# Patient Record
Sex: Female | Born: 1950 | Race: Black or African American | Hispanic: No | Marital: Single | Smoking: Former smoker
Health system: Southern US, Community
[De-identification: ages and names within clinical notes are randomized; demographics above are authoritative.]

## PROBLEM LIST (undated history)

## (undated) DIAGNOSIS — N186 End stage renal disease: Secondary | ICD-10-CM

## (undated) DIAGNOSIS — E785 Hyperlipidemia, unspecified: Secondary | ICD-10-CM

## (undated) DIAGNOSIS — I10 Essential (primary) hypertension: Secondary | ICD-10-CM

## (undated) DIAGNOSIS — E119 Type 2 diabetes mellitus without complications: Secondary | ICD-10-CM

## (undated) HISTORY — DX: Hyperlipidemia, unspecified: E78.5

## (undated) HISTORY — PX: CATARACT EXTRACTION: SUR2

## (undated) HISTORY — PX: ABDOMINAL HYSTERECTOMY: SHX81

## (undated) HISTORY — DX: Essential (primary) hypertension: I10

## (undated) HISTORY — PX: APPENDECTOMY: SHX54

## (undated) HISTORY — DX: Type 2 diabetes mellitus without complications: E11.9

---

## 2014-01-17 ENCOUNTER — Encounter (HOSPITAL_COMMUNITY): Payer: Self-pay | Admitting: Pharmacy Technician

## 2014-01-22 ENCOUNTER — Encounter (HOSPITAL_COMMUNITY)
Admission: RE | Disposition: A | Discharge status: Home / Self Care | Payer: Self-pay | Source: Ambulatory Visit | Attending: Ophthalmology

## 2014-01-22 ENCOUNTER — Encounter (HOSPITAL_COMMUNITY): Payer: Self-pay

## 2014-01-22 ENCOUNTER — Ambulatory Visit (HOSPITAL_COMMUNITY)
Admission: RE | Admit: 2014-01-22 | Discharge: 2014-01-22 | Disposition: A | Discharge status: Home / Self Care | Payer: BC Managed Care – PPO | Source: Ambulatory Visit | Attending: Ophthalmology | Admitting: Ophthalmology

## 2014-01-22 DIAGNOSIS — H2640 Unspecified secondary cataract: Secondary | ICD-10-CM | POA: Insufficient documentation

## 2014-01-22 DIAGNOSIS — Z7982 Long term (current) use of aspirin: Secondary | ICD-10-CM | POA: Diagnosis not present

## 2014-01-22 HISTORY — DX: Essential (primary) hypertension: I10

## 2014-01-22 HISTORY — DX: Type 2 diabetes mellitus without complications: E11.9

## 2014-01-22 HISTORY — PX: YAG LASER APPLICATION: SHX6189

## 2014-01-22 SURGERY — TREATMENT, USING YAG LASER
Anesthesia: LOCAL | Laterality: Left

## 2014-01-22 MED ORDER — TROPICAMIDE 1 % OP SOLN
OPHTHALMIC | Status: AC
  Filled 2014-01-22: qty 3

## 2014-01-22 MED ORDER — TROPICAMIDE 1 % OP SOLN
1.0000 [drp] | OPHTHALMIC | Status: AC
  Administered 2014-01-22 (×3): 1 [drp] via OPHTHALMIC

## 2014-01-22 MED ORDER — APRACLONIDINE HCL 1 % OP SOLN
OPHTHALMIC | Status: AC
  Filled 2014-01-22: qty 0.1

## 2014-01-22 MED ORDER — APRACLONIDINE HCL 1 % OP SOLN
1.0000 [drp] | OPHTHALMIC | Status: AC
  Administered 2014-01-22 (×3): 1 [drp] via OPHTHALMIC

## 2014-01-22 MED ORDER — TETRACAINE HCL 0.5 % OP SOLN
1.0000 [drp] | Freq: Once | OPHTHALMIC | Status: AC
  Administered 2014-01-22: 1 [drp] via OPHTHALMIC

## 2014-01-22 MED ORDER — TETRACAINE HCL 0.5 % OP SOLN
OPHTHALMIC | Status: AC
  Filled 2014-01-22: qty 2

## 2014-01-22 NOTE — Brief Op Note (Signed)
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    Indications:  Visual blur and distortion not correctable with optical correction  Procedure:   YAG laser posterior capsulotomy and lysis of anterior capsular bands OD  Eye protection worn by staff:  Yes.   Laser In Use sign on door:  Yes.    Laser:  {LUMENIS YAG/SLT LASER  Power Setting:  1.4-1.9 mJ/burst Anatomical site treated:  Posterior capsule and ant. Capsule traction bands Number of applications:  83 Total energy delivered: 149 mJ Results:  Small central posterior capsulotomy and lysis of capsular bands.  The visual axis was partially obstructed by the loose posterior capsule fragment.  The lens remained in the torqued position.  The patient was discharged home with instructions to continue all her current glaucoma medications in the un-operated eye, and continue all her current glaucoma medications, if any in the operative eye.  Patient was instructed to go to the office, as previously scheduled, for intraocular pressure:  Yes.    Patient verbalizes understanding of discharge instructions:  Yes.

## 2014-01-22 NOTE — H&P (Signed)
I have reviewed the pre printed H&P, the patient was re-examined, and I have identified no significant interval changes in the patient's medical condition.  There is no change in the plan of care since the history and physical of record. 

## 2014-01-22 NOTE — Discharge Instructions (Signed)
Shelley [name]  01/22/2014     Instructions    Activity: No Restrictions.   Diet: Resume Diet you were on at home.   Pain Medication: Tylenol if Needed.   CONTACT YOUR DOCTOR IF YOU HAVE PAIN, REDNESS IN YOUR EYE, OR DECREASED VISION.   Follow-up:in 02/13/2014 @11 :30 am with Dr. Iona Hansen.    Dr. Loran SentersSR:[id number]  Dr. Iona HansenQN:[id number]  Dr. Geoffry ParadiseBB:[id number]   If you find that you cannot contact your physician, but feel that your signs and   Symptoms warrant a physician's attention, call the Emergency Room at   [phone] [phone].   Othern/a

## 2014-01-24 ENCOUNTER — Encounter (HOSPITAL_COMMUNITY): Payer: Self-pay | Admitting: Ophthalmology

## 2014-05-02 ENCOUNTER — Telehealth: Payer: Self-pay | Admitting: Family Medicine

## 2014-05-02 DIAGNOSIS — Z139 Encounter for screening, unspecified: Secondary | ICD-10-CM

## 2014-05-02 NOTE — Telephone Encounter (Signed)
Chart available?

## 2014-05-02 NOTE — Telephone Encounter (Signed)
Pt is requesting labs to be sent over. No previous labs on epic.

## 2014-05-02 NOTE — Telephone Encounter (Signed)
Chart in message pile 

## 2014-05-03 NOTE — Telephone Encounter (Signed)
Blood work orders placed in Epic. Patient notified. 

## 2014-05-03 NOTE — Telephone Encounter (Signed)
Nurses, I never saw the chart. Lipid, glucose, TSH would be fine. If there is medicine she is on or other specific tests requested please let us know

## 2014-05-09 ENCOUNTER — Encounter: Payer: Self-pay | Admitting: Family Medicine

## 2014-05-09 ENCOUNTER — Other Ambulatory Visit: Payer: Self-pay | Admitting: Family Medicine

## 2014-05-09 ENCOUNTER — Ambulatory Visit (INDEPENDENT_AMBULATORY_CARE_PROVIDER_SITE_OTHER): Payer: BLUE CROSS/BLUE SHIELD | Admitting: Family Medicine

## 2014-05-09 VITALS — BP 190/110 | Ht 66.0 in | Wt 145.0 lb

## 2014-05-09 DIAGNOSIS — Z1231 Encounter for screening mammogram for malignant neoplasm of breast: Secondary | ICD-10-CM

## 2014-05-09 DIAGNOSIS — Z0000 Encounter for general adult medical examination without abnormal findings: Secondary | ICD-10-CM

## 2014-05-09 DIAGNOSIS — R110 Nausea: Secondary | ICD-10-CM

## 2014-05-09 DIAGNOSIS — R739 Hyperglycemia, unspecified: Secondary | ICD-10-CM

## 2014-05-09 DIAGNOSIS — I10 Essential (primary) hypertension: Secondary | ICD-10-CM

## 2014-05-09 MED ORDER — TRIAMTERENE-HCTZ 37.5-25 MG PO CAPS: 1.0000 | ORAL_CAPSULE | Freq: Every day | ORAL | Status: DC

## 2014-05-09 NOTE — Patient Instructions (Signed)
DASH Eating Plan °DASH stands for "Dietary Approaches to Stop Hypertension." The DASH eating plan is a healthy eating plan that has been shown to reduce high blood pressure (hypertension). Additional health benefits may include reducing the risk of type 2 diabetes mellitus, heart disease, and stroke. The DASH eating plan may also help with weight loss. °WHAT DO I NEED TO KNOW ABOUT THE DASH EATING PLAN? °For the DASH eating plan, you will follow these general guidelines: °· Choose foods with a percent daily value for sodium of less than 5% (as listed on the food label). °· Use salt-free seasonings or herbs instead of table salt or sea salt. °· Check with your health care provider or pharmacist before using salt substitutes. °· Eat lower-sodium products, often labeled as "lower sodium" or "no salt added." °· Eat fresh foods. °· Eat more vegetables, fruits, and low-fat dairy products. °· Choose whole grains. Look for the word "whole" as the first word in the ingredient list. °· Choose fish and skinless chicken or turkey more often than red meat. Limit fish, poultry, and meat to 6 oz (170 g) each day. °· Limit sweets, desserts, sugars, and sugary drinks. °· Choose heart-healthy fats. °· Limit cheese to 1 oz (28 g) per day. °· Eat more home-cooked food and less restaurant, buffet, and fast food. °· Limit fried foods. °· Cook foods using methods other than frying. °· Limit canned vegetables. If you do use them, rinse them well to decrease the sodium. °· When eating at a restaurant, ask that your food be prepared with less salt, or no salt if possible. °WHAT FOODS CAN I EAT? °Seek help from a dietitian for individual calorie needs. °Grains °Whole grain or whole wheat bread. Brown rice. Whole grain or whole wheat pasta. Quinoa, bulgur, and whole grain cereals. Low-sodium cereals. Corn or whole wheat flour tortillas. Whole grain cornbread. Whole grain crackers. Low-sodium crackers. °Vegetables °Fresh or frozen vegetables  (raw, steamed, roasted, or grilled). Low-sodium or reduced-sodium tomato and vegetable juices. Low-sodium or reduced-sodium tomato sauce and paste. Low-sodium or reduced-sodium canned vegetables.  °Fruits °All fresh, canned (in natural juice), or frozen fruits. °Meat and Other Protein Products °Ground beef (85% or leaner), grass-fed beef, or beef trimmed of fat. Skinless chicken or turkey. Ground chicken or turkey. Pork trimmed of fat. All fish and seafood. Eggs. Dried beans, peas, or lentils. Unsalted nuts and seeds. Unsalted canned beans. °Dairy °Low-fat dairy products, such as skim or 1% milk, 2% or reduced-fat cheeses, low-fat ricotta or cottage cheese, or plain low-fat yogurt. Low-sodium or reduced-sodium cheeses. °Fats and Oils °Tub margarines without trans fats. Light or reduced-fat mayonnaise and salad dressings (reduced sodium). Avocado. Safflower, olive, or canola oils. Natural peanut or almond butter. °Other °Unsalted popcorn and pretzels. °The items listed above may not be a complete list of recommended foods or beverages. Contact your dietitian for more options. °WHAT FOODS ARE NOT RECOMMENDED? °Grains °White bread. White pasta. White rice. Refined cornbread. Bagels and croissants. Crackers that contain trans fat. °Vegetables °Creamed or fried vegetables. Vegetables in a cheese sauce. Regular canned vegetables. Regular canned tomato sauce and paste. Regular tomato and vegetable juices. °Fruits °Dried fruits. Canned fruit in light or heavy syrup. Fruit juice. °Meat and Other Protein Products °Fatty cuts of meat. Ribs, chicken wings, bacon, sausage, bologna, salami, chitterlings, fatback, hot dogs, bratwurst, and packaged luncheon meats. Salted nuts and seeds. Canned beans with salt. °Dairy °Whole or 2% milk, cream, half-and-half, and cream cheese. Whole-fat or sweetened yogurt. Full-fat   cheeses or blue cheese. Nondairy creamers and whipped toppings. Processed cheese, cheese spreads, or cheese  curds. °Condiments °Onion and garlic salt, seasoned salt, table salt, and sea salt. Canned and packaged gravies. Worcestershire sauce. Tartar sauce. Barbecue sauce. Teriyaki sauce. Soy sauce, including reduced sodium. Steak sauce. Fish sauce. Oyster sauce. Cocktail sauce. Horseradish. Ketchup and mustard. Meat flavorings and tenderizers. Bouillon cubes. Hot sauce. Tabasco sauce. Marinades. Taco seasonings. Relishes. °Fats and Oils °Butter, stick margarine, lard, shortening, ghee, and bacon fat. Coconut, palm kernel, or palm oils. Regular salad dressings. °Other °Pickles and olives. Salted popcorn and pretzels. °The items listed above may not be a complete list of foods and beverages to avoid. Contact your dietitian for more information. °WHERE CAN I FIND MORE INFORMATION? °National Heart, Lung, and [employer] Institute: www.nhlbi.nih.gov/health/health-topics/topics/dash/ °Document Released: 02/26/2011 Document Revised: 07/24/2013 Document Reviewed: 01/11/2013 °ExitCare® Patient Information ©2015 [employer]. This information is not intended to replace advice given to you by your health care provider. Make sure you discuss any questions you have with your health care provider. ° °

## 2014-05-09 NOTE — Progress Notes (Signed)
   Subjective:    Patient ID: Shelley [name], female    DOB: 05-31-1950, 64 y.o.   MRN: [id number]  HPI Patient is here today for a well exam. And her BP is 190/110. Patient states that her BP is always this high. Patient admitted that she has had conversations with you about her BP. And that she has never taken any medication. But now she is ready to do something about her BP  The patient comes in today for a wellness visit.    A review of their health history was completed.  A review of medications was also completed.  Any needed refills; no medications currently  Eating habits: healthy eating habits  Falls/  MVA accidents in past few months: no falls or accidents. Cognitive good.  Regular exercise: exercises daily.  Specialist pt sees on regular basis: does not see other physicians at this time.  Preventative health issues were discussed.   Additional concerns: patient is concerned about her blood pressure.  Review of Systems    she denies chest tightness pressure pain shortness breath nausea vomiting diarrhea high fever chills or swelling in the legs she denies rectal bleeding hematuria breast pain or abdominal pain. Objective:   Physical Exam Eardrums normal throat is normal neck no masses lungs clear hearts regular pulse normal breast exam normal abdomen is soft pelvic exam normal no masses felt no sign of cancer rectal exam is normal extremities no edema skin warm dry neurologic grossly normal.       Assessment & Plan:  HTN poor control start Dyazide one every single day. Follow-up in 4 weeks to recheck blood pressure  History hyperlipidemia check lipid profile watch diet closely  History of hyperglycemia check hemoglobin A1c along with metabolic 7  Patient does experience nausea several days had we not bad enough for vomiting no epigastric pain with it no change in diet no abdominal pain with it. It persists we may need to do some testing. Await results of lab  work.  Breast exam normal patient up-to-date on mammogram Pelvic exam normal has had hysterectomy in the past Patient does need colonoscopy. He gave her the sheet to get herself scheduled and to call us if any problems scheduling it Safety measures dietary measures discussed. Patient exercises on a regular basis.

## 2014-05-10 LAB — BASIC METABOLIC PANEL
BUN: 48 mg/dL — ABNORMAL HIGH (ref 6–23)
CO2: 25 mEq/L (ref 19–32)
Calcium: 8.7 mg/dL (ref 8.4–10.5)
Chloride: 107 mEq/L (ref 96–112)
Creat: 2.04 mg/dL — ABNORMAL HIGH (ref 0.50–1.10)
Glucose, Bld: 289 mg/dL — ABNORMAL HIGH (ref 70–99)
Potassium: 4.4 mEq/L (ref 3.5–5.3)
Sodium: 139 mEq/L (ref 135–145)

## 2014-05-11 LAB — HEMOGLOBIN A1C
Hgb A1c MFr Bld: 10.8 % — ABNORMAL HIGH (ref <5.7)
Mean Plasma Glucose: 263 mg/dL — ABNORMAL HIGH (ref <117)

## 2014-05-11 LAB — LIPID PANEL
Cholesterol: 344 mg/dL — ABNORMAL HIGH (ref 0–200)
HDL: 83 mg/dL (ref >39)
LDL Cholesterol: 226 mg/dL — ABNORMAL HIGH (ref 0–99)
Total CHOL/HDL Ratio: 4.1 Ratio
Triglycerides: 174 mg/dL — ABNORMAL HIGH (ref <150)
VLDL: 35 mg/dL (ref 0–40)

## 2014-05-11 LAB — GLUCOSE, RANDOM: Glucose, Bld: 286 mg/dL — ABNORMAL HIGH (ref 70–99)

## 2014-05-11 LAB — TSH: TSH: 2.407 u[IU]/mL (ref 0.350–4.500)

## 2014-05-11 MED ORDER — AMLODIPINE BESYLATE 5 MG PO TABS: 5.0000 mg | ORAL_TABLET | Freq: Every day | ORAL | Status: DC

## 2014-05-11 NOTE — Addendum Note (Signed)
Addended byCharolotte Capuchin D on: 05/11/2014 03:42 PM   Modules accepted: Orders, Medications

## 2014-05-11 NOTE — Telephone Encounter (Signed)
[facility] 2/19 (Norvasc sent)

## 2014-05-14 ENCOUNTER — Ambulatory Visit (HOSPITAL_COMMUNITY)
Admission: RE | Admit: 2014-05-14 | Discharge: 2014-05-14 | Disposition: A | Discharge status: Home / Self Care | Payer: BLUE CROSS/BLUE SHIELD | Source: Ambulatory Visit | Attending: Family Medicine | Admitting: Family Medicine

## 2014-05-14 DIAGNOSIS — Z1231 Encounter for screening mammogram for malignant neoplasm of breast: Secondary | ICD-10-CM | POA: Insufficient documentation

## 2014-05-21 ENCOUNTER — Encounter: Payer: Self-pay | Admitting: Family Medicine

## 2014-05-21 ENCOUNTER — Ambulatory Visit (INDEPENDENT_AMBULATORY_CARE_PROVIDER_SITE_OTHER): Payer: BLUE CROSS/BLUE SHIELD | Admitting: Family Medicine

## 2014-05-21 VITALS — BP 170/100 | Ht 66.0 in | Wt 141.4 lb

## 2014-05-21 DIAGNOSIS — IMO0002 Reserved for concepts with insufficient information to code with codable children: Secondary | ICD-10-CM | POA: Insufficient documentation

## 2014-05-21 DIAGNOSIS — N183 Chronic kidney disease, stage 3 unspecified: Secondary | ICD-10-CM

## 2014-05-21 DIAGNOSIS — I10 Essential (primary) hypertension: Secondary | ICD-10-CM

## 2014-05-21 DIAGNOSIS — E1122 Type 2 diabetes mellitus with diabetic chronic kidney disease: Secondary | ICD-10-CM | POA: Insufficient documentation

## 2014-05-21 DIAGNOSIS — E785 Hyperlipidemia, unspecified: Secondary | ICD-10-CM | POA: Insufficient documentation

## 2014-05-21 DIAGNOSIS — E1165 Type 2 diabetes mellitus with hyperglycemia: Secondary | ICD-10-CM

## 2014-05-21 DIAGNOSIS — N186 End stage renal disease: Secondary | ICD-10-CM

## 2014-05-21 DIAGNOSIS — N189 Chronic kidney disease, unspecified: Secondary | ICD-10-CM | POA: Insufficient documentation

## 2014-05-21 MED ORDER — INSULIN GLARGINE 100 UNIT/ML SOLOSTAR PEN: PEN_INJECTOR | SUBCUTANEOUS | Status: DC

## 2014-05-21 MED ORDER — ATORVASTATIN CALCIUM 20 MG PO TABS: 20.0000 mg | ORAL_TABLET | Freq: Every day | ORAL | Status: DC

## 2014-05-21 NOTE — Telephone Encounter (Signed)
Patient had OV with Dr. Nicki Reaper today in which this was discussed.

## 2014-05-21 NOTE — Telephone Encounter (Signed)
[facility] 2/29

## 2014-05-21 NOTE — Progress Notes (Signed)
   Subjective:    Patient ID: Shelley [name], female    DOB: Aug 09, 1950, 64 y.o.   MRN: [id number]  Diabetes She presents for her follow-up diabetic visit. She has type 2 diabetes mellitus. Her disease course has been worsening. Pertinent negatives for hypoglycemia include no confusion or headaches. Associated symptoms include polyuria. Pertinent negatives for diabetes include no chest pain, no fatigue, no polydipsia, no polyphagia and no weakness. Pertinent negatives for hypoglycemia complications include no blackouts. Risk factors for coronary artery disease include diabetes mellitus, dyslipidemia and family history. When asked about current treatments, none were reported. Her weight is stable. She is following a diabetic diet. She has not had a previous visit with a dietitian. She participates in exercise daily. Her home blood glucose trend is increasing steadily.   Patient is here today to discuss her recent blood work results. Results are in the system. Patient states that she has no concerns at this time.    Review of Systems  Constitutional: Negative for activity change, appetite change and fatigue.  HENT: Negative for congestion.   Respiratory: Negative for cough.   Cardiovascular: Negative for chest pain.  Gastrointestinal: Negative for abdominal pain.  Endocrine: Positive for polyuria. Negative for polydipsia and polyphagia.  Neurological: Negative for weakness and headaches.  Psychiatric/Behavioral: Negative for confusion.       Objective:   Physical Exam  Constitutional: She appears well-nourished. No distress.  Cardiovascular: Normal rate, regular rhythm and normal heart sounds.   No murmur heard. Pulmonary/Chest: Effort normal and breath sounds normal. No respiratory distress.  Musculoskeletal: She exhibits no edema.  Lymphadenopathy:    She has no cervical adenopathy.  Neurological: She is alert. She exhibits normal muscle tone.  Psychiatric: Her behavior is normal.    Vitals reviewed.         Assessment & Plan:  Diabetes-patient needs to start on Lantus. 8 units at night. Titrate accordingly. Follow-up in one week sooner problems. I also encouraged patient to sign out for my chart.  #2 HTN subpar control just started on Norvasc. If blood pressure is not under better control by next week I would bump up the dose again the 10 mg.  Renal insufficiency recheck metabolic 7 within the next few weeks May need ACE inhibitor. May need renal referral.  Diabetic education recommended  May need to be on additional diabetes medicine depending on her response recheck patient in one week

## 2014-05-23 ENCOUNTER — Telehealth: Payer: Self-pay | Admitting: Family Medicine

## 2014-05-23 NOTE — Telephone Encounter (Signed)
Left VM stating RX faxed to [facility].

## 2014-05-23 NOTE — Telephone Encounter (Signed)
Patient needs new diabetic machine and test strips her old one stop working. Call into [facility].

## 2014-05-29 ENCOUNTER — Encounter: Payer: Self-pay | Admitting: Family Medicine

## 2014-05-29 ENCOUNTER — Ambulatory Visit (INDEPENDENT_AMBULATORY_CARE_PROVIDER_SITE_OTHER): Payer: BLUE CROSS/BLUE SHIELD | Admitting: Family Medicine

## 2014-05-29 VITALS — BP 170/100 | Ht 66.0 in | Wt 143.0 lb

## 2014-05-29 DIAGNOSIS — I10 Essential (primary) hypertension: Secondary | ICD-10-CM

## 2014-05-29 MED ORDER — AMLODIPINE BESYLATE 10 MG PO TABS: 10.0000 mg | ORAL_TABLET | Freq: Every day | ORAL | Status: DC

## 2014-05-29 NOTE — Progress Notes (Signed)
   Subjective:    Patient ID: Shelley [name], female    DOB: 21-Jun-1950, 64 y.o.   MRN: [id number]  HPI Patient is here today for a f/u from her last visit on 05/21/14.  Pt's BP is still running high. Denies HA or blurry vision, but does c/o intermittent nausea.  Pt picked up glucose meter yesterday and checked her blood sugar x 1. It was 160 and that was after 3 hours of not eating.   No questions/concerns. Patient denies any chest pressure tightness pain shortness breath nausea or vomiting.   Review of Systems See above    Objective:   Physical Exam  Lungs clear heart regular pulse normal blood pressure elevated 168/98 best reading extremities no edema      Assessment & Plan:  Diabetes-subpar control patient will continue Lantus. She will send to [place] some blood pressure readings within the next few weeks May need adjustment accordingly. May need additional medication depending on the results of the tests  Hypertension subpar. Increase amlodipine. Start 10 mg daily. More than likely will need additional medications as time moves forward  Chronic kidney disease recheck metabolic 7 nonfasting to get an accurate reflection of creatinine also she is made adjustments in her diet with less protein. She is avoiding protein shakes.  Patient is to follow-up in 2 weeks for a nurse visit on blood pressure may need adjustments of medicine or additional medicines. Blood pressure should be shown to me when she comes in for this. To follow-up sooner problems. Office visit 3 months

## 2014-06-02 ENCOUNTER — Encounter: Payer: Self-pay | Admitting: *Deleted

## 2014-06-06 ENCOUNTER — Ambulatory Visit: Payer: BLUE CROSS/BLUE SHIELD | Admitting: Family Medicine

## 2014-06-12 ENCOUNTER — Encounter: Payer: Self-pay | Admitting: Family Medicine

## 2014-06-12 LAB — MICROALBUMIN / CREATININE URINE RATIO
Creatinine, Urine: 86.9 mg/dL
Microalb Creat Ratio: 4973.5 mg/g — ABNORMAL HIGH (ref 0.0–30.0)
Microalb, Ur: 432.2 mg/dL — ABNORMAL HIGH (ref <2.0)

## 2014-06-12 LAB — BASIC METABOLIC PANEL
BUN: 45 mg/dL — ABNORMAL HIGH (ref 6–23)
CO2: 24 mEq/L (ref 19–32)
Calcium: 8.5 mg/dL (ref 8.4–10.5)
Chloride: 105 mEq/L (ref 96–112)
Creat: 2.37 mg/dL — ABNORMAL HIGH (ref 0.50–1.10)
Glucose, Bld: 132 mg/dL — ABNORMAL HIGH (ref 70–99)
Potassium: 4.4 mEq/L (ref 3.5–5.3)
Sodium: 138 mEq/L (ref 135–145)

## 2014-06-13 ENCOUNTER — Ambulatory Visit (INDEPENDENT_AMBULATORY_CARE_PROVIDER_SITE_OTHER): Payer: BLUE CROSS/BLUE SHIELD | Admitting: Family Medicine

## 2014-06-13 ENCOUNTER — Encounter: Payer: Self-pay | Admitting: Family Medicine

## 2014-06-13 VITALS — BP 158/88 | Ht 66.0 in | Wt 144.0 lb

## 2014-06-13 DIAGNOSIS — R809 Proteinuria, unspecified: Secondary | ICD-10-CM

## 2014-06-13 DIAGNOSIS — I10 Essential (primary) hypertension: Secondary | ICD-10-CM

## 2014-06-13 MED ORDER — CLONIDINE HCL 0.1 MG PO TABS: 0.1000 mg | ORAL_TABLET | Freq: Two times a day (BID) | ORAL | Status: DC

## 2014-06-13 NOTE — Telephone Encounter (Signed)
This patient was talked to directly when she came in later in the day for an office visit.

## 2014-06-13 NOTE — Progress Notes (Signed)
   Subjective:    Patient ID: Shelley [name], female    DOB: 06-21-1950, 64 y.o.   MRN: [id number]  Hypertension This is a chronic problem. Treatments tried: amlodipine. There are no compliance problems.   she relates she taking medication she feels is helping some she denies any swelling with it denies any chest pain shortness breath headaches nausea vomiting or diarrhea Pt states no concerns today.    Review of Systems Denies chest tightness pressure pain shortness breath eating healthy stay physically active exercising    Objective:   Physical Exam  Lungs clear hearts regular pulse normal blood pressure is moderately elevated 148/86  I'm holding off on using a ACE inhibitor in this patient because of the elevated creatinine    Assessment & Plan:  HTN Renal insufficiency Add clonidine 0.1 mg twice a day Check a 24-hour urine for protein as well as creatinine clearance Proteinuria on testing [name] well need referral to nephrology. Await the results of those tests Follow-up in a few weeks time to recheck blood pressure

## 2014-06-22 LAB — CREATININE CLEARANCE, URINE, 24 HOUR
Creatinine Clearance: 34 mL/min — ABNORMAL LOW (ref 88–128)
Creatinine, 24H Ur: 1085.4 mg/24 hr (ref 800.0–1800.0)
Creatinine, Ser: 2.22 mg/dL — ABNORMAL HIGH (ref 0.57–1.00)
Creatinine, Urine: 44.3 mg/dL (ref 15.0–278.0)
GFR calc Af Amer: 27 mL/min/{1.73_m2} — ABNORMAL LOW (ref >59)
GFR calc non Af Amer: 23 mL/min/{1.73_m2} — ABNORMAL LOW (ref >59)

## 2014-06-22 LAB — PROTEIN, URINE, 24 HOUR
Protein, 24H Urine: 5808.8 mg/24 hr — ABNORMAL HIGH (ref 30.0–150.0)
Protein, Ur: 219.2 mg/dL — ABNORMAL HIGH (ref 0.0–15.0)

## 2014-06-26 NOTE — Addendum Note (Signed)
Addended by: Dairl Ponder on: 06/26/2014 02:38 PM   Modules accepted: Orders

## 2014-07-04 ENCOUNTER — Encounter: Payer: Self-pay | Admitting: Family Medicine

## 2014-07-04 ENCOUNTER — Ambulatory Visit (INDEPENDENT_AMBULATORY_CARE_PROVIDER_SITE_OTHER): Payer: BLUE CROSS/BLUE SHIELD | Admitting: Family Medicine

## 2014-07-04 VITALS — BP 162/92 | Ht 66.0 in | Wt 150.0 lb

## 2014-07-04 DIAGNOSIS — R809 Proteinuria, unspecified: Secondary | ICD-10-CM | POA: Diagnosis not present

## 2014-07-04 DIAGNOSIS — I10 Essential (primary) hypertension: Secondary | ICD-10-CM | POA: Diagnosis not present

## 2014-07-04 MED ORDER — POTASSIUM CHLORIDE ER 10 MEQ PO TBCR: 10.0000 meq | EXTENDED_RELEASE_TABLET | Freq: Every day | ORAL | Status: DC

## 2014-07-04 MED ORDER — AMLODIPINE BESYLATE 5 MG PO TABS: 5.0000 mg | ORAL_TABLET | Freq: Every day | ORAL | Status: DC

## 2014-07-04 MED ORDER — INDAPAMIDE 1.25 MG PO TABS: 1.2500 mg | ORAL_TABLET | Freq: Every day | ORAL | Status: DC

## 2014-07-04 NOTE — Progress Notes (Signed)
   Subjective:    Patient ID: Shelley [name], female    DOB: 1950-09-01, 64 y.o.   MRN: [id number]  HPI Patient is here today for a recheck up on her HTN.  She said ever since she was put on the Amlodipine, her lefs have been swollen.  Pt also c/o pulling a muscle in her right leg about 3 weeks ago.  I also gave her an update on referral.   We did discuss aspects of watching diet minimizing salt use  Review of Systems  Constitutional: Negative for activity change, appetite change and fatigue.  HENT: Negative for congestion.   Respiratory: Negative for cough.   Cardiovascular: Positive for leg swelling. Negative for chest pain.  Gastrointestinal: Negative for abdominal pain.  Endocrine: Negative for polydipsia and polyphagia.  Neurological: Negative for weakness.  Psychiatric/Behavioral: Negative for confusion.       Objective:   Physical Exam  Constitutional: She appears well-nourished. No distress.  Cardiovascular: Normal rate, regular rhythm and normal heart sounds.   No murmur heard. Pulmonary/Chest: Effort normal and breath sounds normal. No respiratory distress.  Musculoskeletal: She exhibits no edema.  Lymphadenopathy:    She has no cervical adenopathy.  Neurological: She is alert. She exhibits normal muscle tone.  Psychiatric: Her behavior is normal.  Vitals reviewed.    Her 24-hour urine shows significant proteinuria because of her creatinine been elevated though I do not feel comfortable starting ACE inhibitor. We will go ahead and get her in with nephrology.     Assessment & Plan:  Pedal edema related to medication reduce amlodipine from 10 mg use 5 mg Hypertension I would recommend adding indapamide 1.25 mg every morning along with potassium recheck metabolic 7 in approximately 3-4 weeks Pulled groin muscle will take 3-8 weeks to heal stretching exercises shown avoid NSAIDs Awaiting appointment with nephrology  Recheck in 2 months

## 2014-07-14 LAB — BASIC METABOLIC PANEL
BUN/Creatinine Ratio: 13 (ref 11–26)
BUN: 40 mg/dL — ABNORMAL HIGH (ref 8–27)
CO2: 24 mmol/L (ref 18–29)
Calcium: 9.4 mg/dL (ref 8.7–10.3)
Chloride: 103 mmol/L (ref 97–108)
Creatinine, Ser: 3.03 mg/dL — ABNORMAL HIGH (ref 0.57–1.00)
GFR calc Af Amer: 18 mL/min/{1.73_m2} — ABNORMAL LOW (ref >59)
GFR calc non Af Amer: 16 mL/min/{1.73_m2} — ABNORMAL LOW (ref >59)
Glucose: 128 mg/dL — ABNORMAL HIGH (ref 65–99)
Potassium: 4.5 mmol/L (ref 3.5–5.2)
Sodium: 141 mmol/L (ref 134–144)

## 2014-07-17 ENCOUNTER — Telehealth: Payer: Self-pay | Admitting: Family Medicine

## 2014-07-17 NOTE — Telephone Encounter (Signed)
FYI - Pt has been scheduled with Dr. Lowanda Foster for Thursday, May 19 @ 10:30am and they have already notified pt

## 2014-07-19 ENCOUNTER — Encounter: Payer: Self-pay | Admitting: Family Medicine

## 2014-08-06 ENCOUNTER — Other Ambulatory Visit: Payer: Self-pay | Admitting: *Deleted

## 2014-08-06 MED ORDER — ATORVASTATIN CALCIUM 20 MG PO TABS: 20.0000 mg | ORAL_TABLET | Freq: Every day | ORAL | Status: DC

## 2014-08-07 ENCOUNTER — Telehealth: Payer: Self-pay | Admitting: Family Medicine

## 2014-08-07 NOTE — Telephone Encounter (Signed)
I will discuss this with her when she follows up in early June

## 2014-08-07 NOTE — Telephone Encounter (Signed)
nuts

## 2014-08-07 NOTE — Telephone Encounter (Signed)
Fax received from Dr. Florentina Addison office, patient cancelled her appointment for 08/09/14 with their office due to having issues with her insurance

## 2014-08-27 ENCOUNTER — Ambulatory Visit (INDEPENDENT_AMBULATORY_CARE_PROVIDER_SITE_OTHER): Payer: BLUE CROSS/BLUE SHIELD | Admitting: Family Medicine

## 2014-08-27 ENCOUNTER — Encounter: Payer: Self-pay | Admitting: Family Medicine

## 2014-08-27 VITALS — BP 130/84 | Ht 66.0 in | Wt 144.4 lb

## 2014-08-27 DIAGNOSIS — E1165 Type 2 diabetes mellitus with hyperglycemia: Secondary | ICD-10-CM | POA: Diagnosis not present

## 2014-08-27 DIAGNOSIS — N183 Chronic kidney disease, stage 3 unspecified: Secondary | ICD-10-CM

## 2014-08-27 DIAGNOSIS — E119 Type 2 diabetes mellitus without complications: Secondary | ICD-10-CM | POA: Diagnosis not present

## 2014-08-27 DIAGNOSIS — I10 Essential (primary) hypertension: Secondary | ICD-10-CM | POA: Diagnosis not present

## 2014-08-27 DIAGNOSIS — IMO0002 Reserved for concepts with insufficient information to code with codable children: Secondary | ICD-10-CM

## 2014-08-27 LAB — POCT GLYCOSYLATED HEMOGLOBIN (HGB A1C): Hemoglobin A1C: 6.9

## 2014-08-27 NOTE — Progress Notes (Signed)
   Subjective:    Patient ID: Shelley [name], female    DOB: November 25, 1950, 64 y.o.   MRN: [id number]  Diabetes She presents for her follow-up diabetic visit. She has type 2 diabetes mellitus. There are no hypoglycemic associated symptoms. There are no diabetic associated symptoms. There are no hypoglycemic complications. There are no diabetic complications. There are no known risk factors for coronary artery disease. Current diabetic treatment includes insulin injections. She is compliant with treatment all of the time.   Patient states that she has no concerns at this time.  Has history of hyperlipidemia taking medicine as directed Has history of hypertension watching diet taking medicine denies chest tightness pressure pain headaches Patient relates she was unable to see nephrologist because of a mixup with her insurance  Review of Systems Patient denies chest tightness pressure pain shortness breath nausea vomiting diarrhea    Objective:   Physical Exam  Lungs clear hearts were pulse normal extremities no edema foot exam is normal flanks nontender      Assessment & Plan:  Severe renal kidney disease probably related to blood pressure and diabetes is supposed to see nephrologist in the coming weeks repeat metabolic 7  HTN decent control today. Continue medicine watch diet  Hyperlipidemia continue Crestor check lipid liver profile await results  Diabetes good control continue current measures

## 2014-08-27 NOTE — Patient Instructions (Signed)
Dear Patient,  It has been recommended to you that you have a colonoscopy. It is your responsibility to carry through with this recommendation.   Did you realize that colon cancer is the second leading cancer killer in the [place]. One in every 20 adults will get colon cancer. If all adults would go through the recommended screening for colon cancer (getting a colonoscopy), then there would be a 60% reduction in the number of people dying from colon cancer.  Colon cancer just doesn't come out of the blue. It starts off as a small polyp which over time grows into a cancer. A colonoscopy can prevent cancer and in many cases detected when it is at a very treatable phase. Small colon cancers can have cure rates of 95%. Advanced colon cancer, which often occurs in people who do not do their screenings, have cure rates less than 20%. The risk of colon cancer advances with age. Most adults should have regular colonoscopies every 10 years starting at age 107. This recommendation can vary depending on a person's medical history.  Health-care laws now allow for you to call the gastroenterologist office directly in order to set yourself up for this very important tests. Today we have recommended to you that you do this test. This test may save your life. Failure to do this test puts you at risk for premature death from colon cancer. Do the right thing and schedule this test now.  Here as a list of specialists we recommend in the surrounding area. When you call their office let them know that you are a patient of our practice in your interested in doing a screening colonoscopy. They should assist you without problems. You will need the following information when you called them: 1-name of which Dr. you see, 2-your insurance information, 3-a list of medications that you currently take, 4-any allergies you have to medications.  [place] gastroenterologist Dr. Milton Ferguson, Dr Felicie Morn  gastroenterologist   [facility]   [phone]  [facility] gastroenterology (Dr. Garnetta Buddy and [place]) [phone]  [facility] gastroenterology (Dr. Leia Alf, Harrietta Guardian, [place]) [phone]  Each group of specialists has assured [place] that when you called them they will help you get your colonoscopy set up. Should you have problems please let us know. Be sure to call soon. Sincerely, Pearson Forster, Dr Mickie Hillier, [place]

## 2014-08-29 ENCOUNTER — Other Ambulatory Visit: Payer: Self-pay | Admitting: *Deleted

## 2014-08-29 DIAGNOSIS — E785 Hyperlipidemia, unspecified: Secondary | ICD-10-CM

## 2014-08-29 DIAGNOSIS — Z79899 Other long term (current) drug therapy: Secondary | ICD-10-CM

## 2014-08-30 LAB — BASIC METABOLIC PANEL
BUN/Creatinine Ratio: 17 (ref 11–26)
BUN: 49 mg/dL — ABNORMAL HIGH (ref 8–27)
CO2: 24 mmol/L (ref 18–29)
Calcium: 9.5 mg/dL (ref 8.7–10.3)
Chloride: 102 mmol/L (ref 97–108)
Creatinine, Ser: 2.83 mg/dL — ABNORMAL HIGH (ref 0.57–1.00)
GFR calc Af Amer: 20 mL/min/{1.73_m2} — ABNORMAL LOW (ref >59)
GFR calc non Af Amer: 17 mL/min/{1.73_m2} — ABNORMAL LOW (ref >59)
Glucose: 114 mg/dL — ABNORMAL HIGH (ref 65–99)
Potassium: 4.9 mmol/L (ref 3.5–5.2)
Sodium: 141 mmol/L (ref 134–144)

## 2014-08-30 LAB — LIPID PANEL
Chol/HDL Ratio: 2.7 ratio units (ref 0.0–4.4)
Cholesterol, Total: 204 mg/dL — ABNORMAL HIGH (ref 100–199)
HDL: 75 mg/dL (ref >39)
LDL Calculated: 106 mg/dL — ABNORMAL HIGH (ref 0–99)
Triglycerides: 113 mg/dL (ref 0–149)
VLDL Cholesterol Cal: 23 mg/dL (ref 5–40)

## 2014-08-30 LAB — HEPATIC FUNCTION PANEL
ALT: 29 IU/L (ref 0–32)
AST: 33 IU/L (ref 0–40)
Albumin: 4.2 g/dL (ref 3.6–4.8)
Alkaline Phosphatase: 83 IU/L (ref 39–117)
Bilirubin Total: 0.2 mg/dL (ref 0.0–1.2)
Bilirubin, Direct: 0.09 mg/dL (ref 0.00–0.40)
Total Protein: 6.5 g/dL (ref 6.0–8.5)

## 2014-09-10 MED ORDER — ATORVASTATIN CALCIUM 40 MG PO TABS: 40.0000 mg | ORAL_TABLET | Freq: Every day | ORAL | Status: DC

## 2014-09-27 ENCOUNTER — Other Ambulatory Visit: Payer: Self-pay | Admitting: *Deleted

## 2014-09-27 MED ORDER — AMLODIPINE BESYLATE 5 MG PO TABS: 5.0000 mg | ORAL_TABLET | Freq: Every day | ORAL | Status: DC

## 2014-09-27 MED ORDER — INDAPAMIDE 1.25 MG PO TABS: 1.2500 mg | ORAL_TABLET | Freq: Every day | ORAL | Status: DC

## 2014-10-03 ENCOUNTER — Other Ambulatory Visit: Payer: Self-pay | Admitting: *Deleted

## 2014-10-03 MED ORDER — CLONIDINE HCL 0.1 MG PO TABS: 0.1000 mg | ORAL_TABLET | Freq: Two times a day (BID) | ORAL | Status: DC

## 2014-11-01 ENCOUNTER — Other Ambulatory Visit: Payer: Self-pay

## 2014-11-01 MED ORDER — POTASSIUM CHLORIDE ER 10 MEQ PO TBCR: 10.0000 meq | EXTENDED_RELEASE_TABLET | Freq: Every day | ORAL | Status: DC

## 2014-11-27 ENCOUNTER — Other Ambulatory Visit: Payer: Self-pay | Admitting: Family Medicine

## 2014-11-27 ENCOUNTER — Ambulatory Visit: Payer: BLUE CROSS/BLUE SHIELD | Admitting: Family Medicine

## 2014-12-12 ENCOUNTER — Other Ambulatory Visit: Payer: Self-pay | Admitting: Family Medicine

## 2014-12-14 ENCOUNTER — Other Ambulatory Visit: Payer: Self-pay

## 2014-12-14 MED ORDER — INSULIN GLARGINE 100 UNIT/ML SOLOSTAR PEN: PEN_INJECTOR | SUBCUTANEOUS | Status: DC

## 2015-03-02 ENCOUNTER — Other Ambulatory Visit: Payer: Self-pay | Admitting: Family Medicine

## 2015-03-28 ENCOUNTER — Other Ambulatory Visit: Payer: Self-pay

## 2015-03-28 MED ORDER — INSULIN DETEMIR 100 UNIT/ML FLEXPEN: 8.0000 [IU] | PEN_INJECTOR | Freq: Every day | SUBCUTANEOUS | Status: DC

## 2015-04-19 ENCOUNTER — Other Ambulatory Visit: Payer: Self-pay | Admitting: Family Medicine

## 2015-05-15 ENCOUNTER — Other Ambulatory Visit: Payer: Self-pay | Admitting: Family Medicine

## 2015-05-15 ENCOUNTER — Encounter: Payer: Self-pay | Admitting: Family Medicine

## 2015-05-15 ENCOUNTER — Ambulatory Visit (INDEPENDENT_AMBULATORY_CARE_PROVIDER_SITE_OTHER): Payer: BLUE CROSS/BLUE SHIELD | Admitting: Family Medicine

## 2015-05-15 VITALS — BP 130/80 | Temp 98.4°F | Ht 66.0 in | Wt 159.2 lb

## 2015-05-15 DIAGNOSIS — R5383 Other fatigue: Secondary | ICD-10-CM | POA: Diagnosis not present

## 2015-05-15 DIAGNOSIS — R1013 Epigastric pain: Secondary | ICD-10-CM | POA: Diagnosis not present

## 2015-05-15 DIAGNOSIS — R110 Nausea: Secondary | ICD-10-CM

## 2015-05-15 LAB — POCT HEMOGLOBIN: Hemoglobin: 12.1 g/dL — AB (ref 12.2–16.2)

## 2015-05-15 MED ORDER — PANTOPRAZOLE SODIUM 40 MG PO TBEC: 40.0000 mg | DELAYED_RELEASE_TABLET | Freq: Every day | ORAL | Status: DC

## 2015-05-15 NOTE — Progress Notes (Signed)
   Subjective:    Patient ID: Shelley [name], female    DOB: March 21, 1951, 65 y.o.   MRN: [id number]  Abdominal Pain This is a new problem. The current episode started 1 to 4 weeks ago. The problem occurs intermittently. The problem has been unchanged. The pain is located in the generalized abdominal region. The pain is moderate. The quality of the pain is aching. The abdominal pain does not radiate. Associated symptoms include headaches. The pain is aggravated by eating. The pain is relieved by nothing. Treatments tried: tagamet  The treatment provided mild relief.   Patient states she has no other concerns at this time.   PMH benign Review of Systems  Gastrointestinal: Positive for abdominal pain.  Neurological: Positive for headaches.   she relates epigastric pain she relates nausea does not radiate to the back it wakes her up 2-3 times per week in addition to this has fairly persistent nausea with intermittent dry heaves during the day she denies joint pain denies wheezing difficulty breathing chest pain     Objective:   Physical Exam   on exam neck no masses eardrums normal throat is normal lungs are clear no crackles heart is regular no murmurs abdomen is soft there is no guarding rebound or tenderness      Assessment & Plan:   abdominal discomfort intermittent along with frequent nausea and frequent dry heaves-it is hard to discern exactly what is causing this currently we will go ahead and cover with PPI stop aspirin. In addition to this do lab work. Patient may well need to have endoscopy. I doubt pancreatic cancer but she may end up needing to have a CAT scan as well.   this could be gastroparesis as well given her diabetes.   this patient has been counseled before regarding colonoscopy but has deferred in doing so. She has not lost any weight recently and denies any rectal bleeding.

## 2015-05-15 NOTE — Patient Instructions (Signed)
Hold off on the aspirin till we figure everything out

## 2015-05-16 LAB — CBC WITH DIFFERENTIAL/PLATELET
Basophils Absolute: 0 10*3/uL (ref 0.0–0.2)
Basos: 0 %
EOS (ABSOLUTE): 0.1 10*3/uL (ref 0.0–0.4)
Eos: 2 %
Hematocrit: 38.3 % (ref 34.0–46.6)
Hemoglobin: 12 g/dL (ref 11.1–15.9)
Immature Grans (Abs): 0 10*3/uL (ref 0.0–0.1)
Immature Granulocytes: 0 %
Lymphocytes Absolute: 1.2 10*3/uL (ref 0.7–3.1)
Lymphs: 18 %
MCH: 30.1 pg (ref 26.6–33.0)
MCHC: 31.3 g/dL — ABNORMAL LOW (ref 31.5–35.7)
MCV: 96 fL (ref 79–97)
Monocytes Absolute: 0.2 10*3/uL (ref 0.1–0.9)
Monocytes: 3 %
Neutrophils Absolute: 4.9 10*3/uL (ref 1.4–7.0)
Neutrophils: 77 %
Platelets: 293 10*3/uL (ref 150–379)
RBC: 3.99 x10E6/uL (ref 3.77–5.28)
RDW: 13.4 % (ref 12.3–15.4)
WBC: 6.5 10*3/uL (ref 3.4–10.8)

## 2015-05-16 LAB — BASIC METABOLIC PANEL
BUN/Creatinine Ratio: 12 (ref 11–26)
BUN: 46 mg/dL — ABNORMAL HIGH (ref 8–27)
CO2: 23 mmol/L (ref 18–29)
Calcium: 8.6 mg/dL — ABNORMAL LOW (ref 8.7–10.3)
Chloride: 93 mmol/L — ABNORMAL LOW (ref 96–106)
Creatinine, Ser: 3.77 mg/dL — ABNORMAL HIGH (ref 0.57–1.00)
GFR calc Af Amer: 14 mL/min/{1.73_m2} — ABNORMAL LOW (ref >59)
GFR calc non Af Amer: 12 mL/min/{1.73_m2} — ABNORMAL LOW (ref >59)
Glucose: 658 mg/dL (ref 65–99)
Potassium: 5.3 mmol/L — ABNORMAL HIGH (ref 3.5–5.2)
Sodium: 132 mmol/L — ABNORMAL LOW (ref 134–144)

## 2015-05-16 LAB — AMYLASE: Amylase: 154 U/L — ABNORMAL HIGH (ref 31–124)

## 2015-05-16 LAB — HEMOGLOBIN A1C
Est. average glucose Bld gHb Est-mCnc: 301 mg/dL
Hgb A1c MFr Bld: 12.1 % — ABNORMAL HIGH (ref 4.8–5.6)

## 2015-05-16 LAB — LIPASE: Lipase: 460 U/L — ABNORMAL HIGH (ref 0–59)

## 2015-05-17 ENCOUNTER — Other Ambulatory Visit (HOSPITAL_COMMUNITY)
Admission: RE | Admit: 2015-05-17 | Discharge: 2015-05-17 | Disposition: A | Discharge status: Home / Self Care | Payer: BLUE CROSS/BLUE SHIELD | Source: Ambulatory Visit | Attending: Family Medicine | Admitting: Family Medicine

## 2015-05-17 ENCOUNTER — Telehealth: Payer: Self-pay | Admitting: *Deleted

## 2015-05-17 ENCOUNTER — Encounter: Payer: Self-pay | Admitting: Family Medicine

## 2015-05-17 ENCOUNTER — Other Ambulatory Visit: Payer: Self-pay | Admitting: *Deleted

## 2015-05-17 ENCOUNTER — Ambulatory Visit (INDEPENDENT_AMBULATORY_CARE_PROVIDER_SITE_OTHER): Payer: BLUE CROSS/BLUE SHIELD | Admitting: Family Medicine

## 2015-05-17 VITALS — BP 172/106 | Ht 66.0 in | Wt 159.0 lb

## 2015-05-17 DIAGNOSIS — I10 Essential (primary) hypertension: Secondary | ICD-10-CM

## 2015-05-17 DIAGNOSIS — N184 Chronic kidney disease, stage 4 (severe): Secondary | ICD-10-CM | POA: Diagnosis not present

## 2015-05-17 DIAGNOSIS — K858 Other acute pancreatitis without necrosis or infection: Secondary | ICD-10-CM | POA: Diagnosis present

## 2015-05-17 DIAGNOSIS — Z23 Encounter for immunization: Secondary | ICD-10-CM

## 2015-05-17 DIAGNOSIS — N289 Disorder of kidney and ureter, unspecified: Secondary | ICD-10-CM

## 2015-05-17 DIAGNOSIS — E119 Type 2 diabetes mellitus without complications: Secondary | ICD-10-CM

## 2015-05-17 DIAGNOSIS — K859 Acute pancreatitis without necrosis or infection, unspecified: Secondary | ICD-10-CM

## 2015-05-17 LAB — CBC WITH DIFFERENTIAL/PLATELET
Basophils Absolute: 0 10*3/uL (ref 0.0–0.1)
Basophils Relative: 1 %
Eosinophils Absolute: 0.2 10*3/uL (ref 0.0–0.7)
Eosinophils Relative: 3 %
HCT: 33 % — ABNORMAL LOW (ref 36.0–46.0)
Hemoglobin: 11.5 g/dL — ABNORMAL LOW (ref 12.0–15.0)
Lymphocytes Relative: 20 %
Lymphs Abs: 1.2 10*3/uL (ref 0.7–4.0)
MCH: 30.7 pg (ref 26.0–34.0)
MCHC: 34.8 g/dL (ref 30.0–36.0)
MCV: 88.2 fL (ref 78.0–100.0)
Monocytes Absolute: 0.2 10*3/uL (ref 0.1–1.0)
Monocytes Relative: 4 %
Neutro Abs: 4.5 10*3/uL (ref 1.7–7.7)
Neutrophils Relative %: 72 %
Platelets: 232 10*3/uL (ref 150–400)
RBC: 3.74 MIL/uL — ABNORMAL LOW (ref 3.87–5.11)
RDW: 12.9 % (ref 11.5–15.5)
WBC: 6.2 10*3/uL (ref 4.0–10.5)

## 2015-05-17 LAB — BASIC METABOLIC PANEL
Anion gap: 8 (ref 5–15)
BUN: 47 mg/dL — ABNORMAL HIGH (ref 6–20)
CO2: 26 mmol/L (ref 22–32)
Calcium: 8.3 mg/dL — ABNORMAL LOW (ref 8.9–10.3)
Chloride: 103 mmol/L (ref 101–111)
Creatinine, Ser: 4.14 mg/dL — ABNORMAL HIGH (ref 0.44–1.00)
GFR calc Af Amer: 12 mL/min — ABNORMAL LOW (ref >60)
GFR calc non Af Amer: 11 mL/min — ABNORMAL LOW (ref >60)
Glucose, Bld: 343 mg/dL — ABNORMAL HIGH (ref 65–99)
Potassium: 4.4 mmol/L (ref 3.5–5.1)
Sodium: 137 mmol/L (ref 135–145)

## 2015-05-17 LAB — POCT GLUCOSE (DEVICE FOR HOME USE): POC Glucose: 390 mg/dl — AB (ref 70–99)

## 2015-05-17 LAB — LIPASE, BLOOD: Lipase: 66 U/L — ABNORMAL HIGH (ref 11–51)

## 2015-05-17 LAB — AMYLASE: Amylase: 83 U/L (ref 28–100)

## 2015-05-17 MED ORDER — CLONIDINE HCL 0.1 MG PO TABS: 0.1000 mg | ORAL_TABLET | Freq: Two times a day (BID) | ORAL | Status: DC

## 2015-05-17 MED ORDER — INDAPAMIDE 1.25 MG PO TABS: ORAL_TABLET | ORAL | Status: DC

## 2015-05-17 MED ORDER — AMLODIPINE BESYLATE 5 MG PO TABS
ORAL_TABLET | ORAL | Status: DC
Start: 2015-05-17 — End: 2016-01-30

## 2015-05-17 MED ORDER — POTASSIUM CHLORIDE ER 10 MEQ PO TBCR: 10.0000 meq | EXTENDED_RELEASE_TABLET | Freq: Every day | ORAL | Status: DC

## 2015-05-17 MED ORDER — INSULIN DETEMIR 100 UNIT/ML FLEXPEN: 14.0000 [IU] | PEN_INJECTOR | Freq: Every day | SUBCUTANEOUS | Status: DC

## 2015-05-17 MED ORDER — TRIAMTERENE-HCTZ 37.5-25 MG PO CAPS: ORAL_CAPSULE | ORAL | Status: DC

## 2015-05-17 MED ORDER — ATORVASTATIN CALCIUM 40 MG PO TABS: ORAL_TABLET | ORAL | Status: AC

## 2015-05-17 MED ORDER — INSULIN ASPART 100 UNIT/ML ~~LOC~~ SOLN: SUBCUTANEOUS | Status: DC

## 2015-05-17 MED ORDER — INSULIN LISPRO 100 UNIT/ML ~~LOC~~ SOLN: SUBCUTANEOUS | Status: DC

## 2015-05-17 MED ORDER — PANTOPRAZOLE SODIUM 40 MG PO TBEC: 40.0000 mg | DELAYED_RELEASE_TABLET | Freq: Every day | ORAL | Status: DC

## 2015-05-17 NOTE — Telephone Encounter (Signed)
[facility] . Pt has an appt at [facility] on 2/28 register 7:15 am for a  7:30 am. . Nothing to eat or drink after mignight.

## 2015-05-17 NOTE — Progress Notes (Signed)
   Subjective:    Patient ID: Shelley [name], female    DOB: 1950/12/09, 65 y.o.   MRN: [id number]  Diabetes She presents for her follow-up diabetic visit. She has type 2 diabetes mellitus. Current diabetic treatment includes insulin injections.  Bs today 390. Has had coffee with creamer.   Needs refills on all meds.  She states she ran out of her insulin a while back and she just never really thought about taking her insulin until recently she is had difficulty in the past regarding getting her sugars under control in doing adequate job of taking care of herself she is a exceptionally nice patient who is well-intentioned.  Review of Systems She denies any chest tightness pressure pain shortness breath nausea vomiting diarrhea    Objective:   Physical Exam   lungs clear heart regular abdomen soft no guarding rebound or tenderness extremities no edema skin warm dry      Assessment & Plan:   pancreatitis-patient is not having any current pain. Makes somewhat in question. Repeat lab work stand today may need referral to the hospital ER  Diabetes poor control patient has been without her insulin but recently she was able to get she will take 14 units of long-acting insulin and she will start short acting insulin 5 units with all meals she will watch her diet   if she gets worse over the weekend with severe abdominal pain nausea vomiting or other problems she will immediately go to the ER.   addendum-lab work shows worsening creatinine function patient is encouraged to do a better job with fluid intake. Urgent referral back to nephrology.  addendum-lab work shows improvement with pancreas but we will still set her up for ultrasound of the abdomen  She will let us know next week how her glucose readings are. May well need to get her in with nephrology as well.

## 2015-05-17 NOTE — Patient Instructions (Signed)
Please do your lab work  Follow directions on your insulin I will call you later today If you do not receive a phone call please call me back to.

## 2015-05-20 NOTE — Telephone Encounter (Signed)
[facility]

## 2015-05-20 NOTE — Telephone Encounter (Signed)
Patient was notified.

## 2015-05-21 ENCOUNTER — Ambulatory Visit (HOSPITAL_COMMUNITY)
Admission: RE | Admit: 2015-05-21 | Discharge: 2015-05-21 | Disposition: A | Discharge status: Home / Self Care | Payer: BLUE CROSS/BLUE SHIELD | Source: Ambulatory Visit | Attending: Family Medicine | Admitting: Family Medicine

## 2015-05-21 DIAGNOSIS — K859 Acute pancreatitis without necrosis or infection, unspecified: Secondary | ICD-10-CM | POA: Insufficient documentation

## 2015-05-21 DIAGNOSIS — N289 Disorder of kidney and ureter, unspecified: Secondary | ICD-10-CM | POA: Insufficient documentation

## 2015-05-21 DIAGNOSIS — K8689 Other specified diseases of pancreas: Secondary | ICD-10-CM | POA: Insufficient documentation

## 2015-05-22 ENCOUNTER — Encounter: Payer: Self-pay | Admitting: Family Medicine

## 2015-05-23 ENCOUNTER — Encounter: Payer: Self-pay | Admitting: Family Medicine

## 2015-06-17 ENCOUNTER — Ambulatory Visit (INDEPENDENT_AMBULATORY_CARE_PROVIDER_SITE_OTHER): Payer: BLUE CROSS/BLUE SHIELD | Admitting: "Endocrinology

## 2015-06-17 ENCOUNTER — Encounter: Payer: Self-pay | Admitting: "Endocrinology

## 2015-06-17 VITALS — BP 174/98 | HR 97 | Ht 66.0 in | Wt 173.0 lb

## 2015-06-17 DIAGNOSIS — E785 Hyperlipidemia, unspecified: Secondary | ICD-10-CM | POA: Diagnosis not present

## 2015-06-17 DIAGNOSIS — E1122 Type 2 diabetes mellitus with diabetic chronic kidney disease: Secondary | ICD-10-CM

## 2015-06-17 DIAGNOSIS — I10 Essential (primary) hypertension: Secondary | ICD-10-CM

## 2015-06-17 DIAGNOSIS — N185 Chronic kidney disease, stage 5: Secondary | ICD-10-CM | POA: Diagnosis not present

## 2015-06-17 DIAGNOSIS — Z794 Long term (current) use of insulin: Secondary | ICD-10-CM | POA: Diagnosis not present

## 2015-06-17 MED ORDER — FUROSEMIDE 20 MG PO TABS: 20.0000 mg | ORAL_TABLET | Freq: Every day | ORAL | Status: DC

## 2015-06-17 NOTE — Patient Instructions (Signed)

## 2015-06-17 NOTE — Progress Notes (Signed)
Subjective:    Patient ID: Shelley [name], female    DOB: 03/30/1950. Patient is being seen in consultation for management of diabetes requested by  Sallee Lange, MD  Past Medical History  Diagnosis Date  . Hypertension   . Diabetes mellitus without complication ([place])   . HTN (hypertension)   . Diabetes ([place])   . Hyperlipidemia    Past Surgical History  Procedure Laterality Date  . Cataract extraction Bilateral   . Appendectomy    . Yag laser application Left 0000000    Procedure: YAG LASER APPLICATION;  Surgeon: Williams Che, MD;  Location: [facility];  Service: Ophthalmology;  Laterality: Left;  . Abdominal hysterectomy     Social History   Social History  . Marital Status: Single    Spouse Name: N/A  . Number of Children: N/A  . Years of Education: N/A   Social History Main Topics  . Smoking status: Never Smoker   . Smokeless tobacco: None  . Alcohol Use: No  . Drug Use: No  . Sexual Activity: Not Asked   Other Topics Concern  . None   Social History Narrative   Outpatient Encounter Prescriptions as of 06/17/2015  Medication Sig  . amLODipine (NORVASC) 5 MG tablet TAKE 1 TABLET (5 MG TOTAL) BY MOUTH DAILY.  Marland Kitchen atorvastatin (LIPITOR) 40 MG tablet TAKE 1 TABLET (40 MG TOTAL) BY MOUTH DAILY.  . cloNIDine (CATAPRES) 0.1 MG tablet Take 1 tablet (0.1 mg total) by mouth 2 (two) times daily.  . indapamide (LOZOL) 1.25 MG tablet TAKE 1 TABLET (1.25 MG TOTAL) BY MOUTH DAILY.  Marland Kitchen Insulin Glargine (LANTUS SOLOSTAR [place]) Inject 20 Units into the skin at bedtime.  . Insulin Lispro (HUMALOG KWIKPEN [place]) Inject 5-11 Units into the skin 3 (three) times daily with meals.  . pantoprazole (PROTONIX) 40 MG tablet Take 1 tablet (40 mg total) by mouth daily.  . potassium chloride (K-DUR) 10 MEQ tablet Take 1 tablet (10 mEq total) by mouth daily.  . [DISCONTINUED] Insulin Glargine (LANTUS SOLOSTAR) 100 UNIT/ML Solostar Pen Inject 14 Units into the skin at bedtime.  Marland Kitchen aspirin EC 81 MG  tablet Take 81 mg by mouth daily.  . furosemide (LASIX) 20 MG tablet Take 1 tablet (20 mg total) by mouth daily.  . insulin lispro (HUMALOG) 100 UNIT/ML injection 5 units with meals  . Multiple Vitamin (MULTIVITAMIN WITH MINERALS) TABS tablet Take 1 tablet by mouth daily.  Marland Kitchen triamterene-hydrochlorothiazide (DYAZIDE) 37.5-25 MG capsule TAKE 1 EACH (1 CAPSULE TOTAL) BY MOUTH DAILY.  . [DISCONTINUED] insulin aspart (NOVOLOG) 100 UNIT/ML injection 5 units with meals  . [DISCONTINUED] Insulin Detemir (LEVEMIR) 100 UNIT/ML Pen Inject 14 Units into the skin at bedtime. May titrate up to 30 units   No facility-administered encounter medications on file as of 06/17/2015.   ALLERGIES: No Known Allergies VACCINATION STATUS: Immunization History  Administered Date(s) Administered  . Influenza,inj,Quad PF,36+ Mos 05/17/2015  . Pneumococcal-Unspecified 12/22/2010    Diabetes She presents for her initial diabetic visit. She has type 2 diabetes mellitus. Onset time: She was diagnosed at approximate age of 58 years. Her disease course has been worsening. There are no hypoglycemic associated symptoms. Pertinent negatives for hypoglycemia include no confusion, headaches, pallor or seizures. Associated symptoms include fatigue, foot paresthesias, polydipsia, polyuria and visual change. Pertinent negatives for diabetes include no chest pain and no polyphagia. There are no hypoglycemic complications. Symptoms are worsening. 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 She relates  that she has seen nephrology in the past but has not kept her appointments. ) Risk factors for coronary artery disease include diabetes mellitus, dyslipidemia, sedentary lifestyle, tobacco exposure and hypertension. Her weight is increasing steadily (She has seen increasing swelling on her bilateral legs associated with weight gain of 10-15 pounds in the last 4 weeks.). She is following a generally unhealthy diet. When  asked about meal planning, she reported none. She has not had a previous visit with a dietitian (She declines referral to a dietitian.). She participates in exercise intermittently. Her home blood glucose trend is increasing steadily (She did not bring her meter nor logs to review today. Her most recent A1c was 12.1%. This is from May 10 2015.). An ACE inhibitor/angiotensin II receptor blocker is not being taken. She does not see a podiatrist.Eye exam is not current.  Hyperlipidemia This is a chronic problem. The current episode started more than 1 year ago. Pertinent negatives include no chest pain, myalgias or shortness of breath. Current antihyperlipidemic treatment includes statins. Risk factors for coronary artery disease include dyslipidemia, diabetes mellitus, hypertension and a sedentary lifestyle.  Hypertension This is a chronic problem. The current episode started more than 1 year ago. Pertinent negatives include no chest pain, headaches, palpitations or shortness of breath. Risk factors for coronary artery disease include smoking/tobacco exposure, diabetes mellitus and dyslipidemia. Past treatments include central alpha agonists. Hypertensive end-organ damage includes kidney disease.      Review of Systems  Constitutional: Positive for fatigue. Negative for fever, chills and unexpected weight change.  HENT: Negative for trouble swallowing and voice change.   Eyes: Negative for visual disturbance.  Respiratory: Negative for cough, shortness of breath and wheezing.   Cardiovascular: Negative for chest pain, palpitations and leg swelling.  Gastrointestinal: Negative for nausea, vomiting and diarrhea.  Endocrine: Positive for polydipsia and polyuria. Negative for cold intolerance, heat intolerance and polyphagia.  Musculoskeletal: Negative for myalgias and arthralgias.  Skin: Negative for color change, pallor, rash and wound.  Neurological: Negative for seizures and headaches.   Psychiatric/Behavioral: Negative for suicidal ideas and confusion.    Objective:    BP 174/98 mmHg  Pulse 97  Ht 5\' 6"  (1.676 m)  Wt 173 lb (78.472 kg)  BMI 27.94 kg/m2  SpO2 98%  Wt Readings from Last 3 Encounters:  06/17/15 173 lb (78.472 kg)  05/17/15 159 lb (72.122 kg)  05/15/15 159 lb 4 oz (72.235 kg)    Physical Exam  Constitutional: She is oriented to person, place, and time. She appears well-developed.  HENT:  Head: Normocephalic and atraumatic.  Eyes: EOM are normal.  Neck: Normal range of motion. Neck supple. No tracheal deviation present. No thyromegaly present.  Cardiovascular: Normal rate and regular rhythm.   Pulmonary/Chest: Effort normal and breath sounds normal.  Abdominal: Soft. Bowel sounds are normal. There is no tenderness. There is no guarding.  Musculoskeletal: Normal range of motion. She exhibits no edema.  Neurological: She is alert and oriented to person, place, and time. She has normal reflexes. No cranial nerve deficit. Coordination normal.  Skin: Skin is warm and dry. No rash noted. No erythema. No pallor.  Psychiatric: She has a normal mood and affect. Judgment normal.     CMP ( most recent) CMP     Component Value Date/Time   NA 137 05/17/2015 1004   NA 132* 05/15/2015 1349   K 4.4 05/17/2015 1004   CL 103 05/17/2015 1004   CO2 26 05/17/2015 1004  GLUCOSE 343* 05/17/2015 1004   GLUCOSE 658* 05/15/2015 1349   BUN 47* 05/17/2015 1004   BUN 46* 05/15/2015 1349   CREATININE 4.14* 05/17/2015 1004   CREATININE 2.37* 06/11/2014 1519   CALCIUM 8.3* 05/17/2015 1004   PROT 6.5 08/29/2014 0853   ALBUMIN 4.2 08/29/2014 0853   AST 33 08/29/2014 0853   ALT 29 08/29/2014 0853   ALKPHOS 83 08/29/2014 0853   BILITOT 0.2 08/29/2014 0853   GFRNONAA 11* 05/17/2015 1004   GFRAA 12* 05/17/2015 1004     Diabetic Labs (most recent): Lab Results  Component Value Date   HGBA1C 12.1* 05/15/2015   HGBA1C 6.9 08/27/2014   HGBA1C 10.8* 05/09/2014      Lipid Panel ( most recent) Lipid Panel     Component Value Date/Time   CHOL 204* 08/29/2014 0853   CHOL 344* 05/10/2014 0719   TRIG 113 08/29/2014 0853   HDL 75 08/29/2014 0853   HDL 83 05/10/2014 0719   CHOLHDL 2.7 08/29/2014 0853   CHOLHDL 4.1 05/10/2014 0719   VLDL 35 05/10/2014 0719   LDLCALC 106* 08/29/2014 0853   LDLCALC 226* 05/10/2014 0719      Assessment & Plan:   1. Type 2 diabetes mellitus with stage 5 chronic kidney disease not on chronic dialysis, with long-term current use of insulin ([place])   - Patient has currently uncontrolled symptomatic type 2 DM since  66 years of age,  with most recent A1c of 12.1 %. Recent labs reviewed.   Her diabetes is complicated by stage 5 CKD and patient remains at a high risk for more acute and chronic complications of diabetes which include CAD, CVA, CKD, retinopathy, and neuropathy. These are all discussed in detail with the patient.  - I have counseled the patient on diet management and weight loss, by adopting a carbohydrate restricted/protein rich diet.  - Suggestion is made for patient to avoid simple carbohydrates   from their diet including Cakes , Desserts, Ice Cream,  Soda (  diet and regular) , Sweet Tea , Candies,  Chips, Cookies, Artificial Sweeteners,   and "Sugar-free" Products . This will help patient to have stable blood glucose profile and potentially avoid unintended weight gain.  - I encouraged the patient to switch to  unprocessed or minimally processed complex starch and increased protein intake (animal or plant source), fruits, and vegetables.  - Patient is advised to stick to a routine mealtimes to eat 3 meals  a day and avoid unnecessary snacks ( to snack only to correct hypoglycemia).  - The patient has declines a  CDE consult for individualized DM education.  - I have approached patient with the following individualized plan to manage diabetes and patient agrees:   - I  will proceed to readjust basal  insulin Levemir to 20 units QHS, and prandial insulin Humalog 5 units TIDAC for pre-meal BG readings of 90-150mg /dl, plus patient specific correction dose for unexpected hyperglycemia above 150mg /dl, associated with strict monitoring of glucose  AC and HS. - Patient is warned not to take insulin without proper monitoring per orders. -Adjustment parameters are given for hypo and hyperglycemia in writing. -Patient is encouraged to call clinic for blood glucose levels less than 70 or above 300 mg /dl.  -Patient is not a candidate for metformin,SGLT2 i due to CKD. -I will refer her to nephrology in [place]. This is per her request . - Patient will be considered for incretin therapy as appropriate next visit. - Patient specific target  A1c;  LDL, HDL, Triglycerides, and  Waist Circumference were discussed in detail.  2) BP/HTN: uncontrolled. Continue current medications including clonidine. I will consider low-dose ace inhibitor next visit. 3) Lipids/HPL:  uncontrolled , LDL at 106 continue statins. 4)  Weight/Diet:  She declines CDE Consult, exercise, and detailed carbohydrates information provided.  5) Chronic Care/Health Maintenance:  -Patient is on Statin medications and encouraged to continue to follow up with Ophthalmology, Podiatrist at least yearly or according to recommendations, and advised to   stay away from smoking. I have recommended yearly flu vaccine and pneumonia vaccination at least every 5 years; moderate intensity exercise for up to 150 minutes weekly; and  sleep for at least 7 hours a day.  - 60 minutes of time was spent on the care of this patient , 50% of which was applied for counseling on diabetes complications and their preventions.  - Patient to bring meter and  blood glucose logs during their next visit.   - I advised patient to maintain close follow up with Sallee Lange, MD for primary care needs.  Follow up plan: - Return in about 1 week (around 06/24/2015) for  diabetes, high blood pressure, high cholesterol, follow up with pre-visit labs, meter, and logs.  Glade Lloyd, MD Phone: [phone]  Fax: [phone]   06/17/2015, 4:38 PM

## 2015-06-25 ENCOUNTER — Ambulatory Visit (INDEPENDENT_AMBULATORY_CARE_PROVIDER_SITE_OTHER): Payer: BLUE CROSS/BLUE SHIELD | Admitting: "Endocrinology

## 2015-06-25 ENCOUNTER — Encounter: Payer: Self-pay | Admitting: "Endocrinology

## 2015-06-25 VITALS — BP 169/97 | HR 88 | Ht 66.0 in | Wt 169.0 lb

## 2015-06-25 DIAGNOSIS — IMO0002 Reserved for concepts with insufficient information to code with codable children: Secondary | ICD-10-CM

## 2015-06-25 DIAGNOSIS — I10 Essential (primary) hypertension: Secondary | ICD-10-CM | POA: Diagnosis not present

## 2015-06-25 DIAGNOSIS — N184 Chronic kidney disease, stage 4 (severe): Secondary | ICD-10-CM | POA: Diagnosis not present

## 2015-06-25 DIAGNOSIS — Z794 Long term (current) use of insulin: Secondary | ICD-10-CM | POA: Diagnosis not present

## 2015-06-25 DIAGNOSIS — E1165 Type 2 diabetes mellitus with hyperglycemia: Secondary | ICD-10-CM | POA: Diagnosis not present

## 2015-06-25 DIAGNOSIS — E1122 Type 2 diabetes mellitus with diabetic chronic kidney disease: Secondary | ICD-10-CM

## 2015-06-25 DIAGNOSIS — E785 Hyperlipidemia, unspecified: Secondary | ICD-10-CM

## 2015-06-25 NOTE — Patient Instructions (Signed)

## 2015-06-25 NOTE — Progress Notes (Signed)
Subjective:    Patient ID: Shelley [name], female    DOB: 30-Sep-1950. Patient is  to follow up for  management of diabetes requested by  Sallee Lange, MD  Past Medical History  Diagnosis Date  . Hypertension   . Diabetes mellitus without complication ([place])   . HTN (hypertension)   . Diabetes ([place])   . Hyperlipidemia    Past Surgical History  Procedure Laterality Date  . Cataract extraction Bilateral   . Appendectomy    . Yag laser application Left 0000000    Procedure: YAG LASER APPLICATION;  Surgeon: Williams Che, MD;  Location: [facility];  Service: Ophthalmology;  Laterality: Left;  . Abdominal hysterectomy     Social History   Social History  . Marital Status: Single    Spouse Name: N/A  . Number of Children: N/A  . Years of Education: N/A   Social History Main Topics  . Smoking status: Never Smoker   . Smokeless tobacco: None  . Alcohol Use: No  . Drug Use: No  . Sexual Activity: Not Asked   Other Topics Concern  . None   Social History Narrative   Outpatient Encounter Prescriptions as of 06/25/2015  Medication Sig  . amLODipine (NORVASC) 5 MG tablet TAKE 1 TABLET (5 MG TOTAL) BY MOUTH DAILY.  Marland Kitchen aspirin EC 81 MG tablet Take 81 mg by mouth daily.  Marland Kitchen atorvastatin (LIPITOR) 40 MG tablet TAKE 1 TABLET (40 MG TOTAL) BY MOUTH DAILY.  . cloNIDine (CATAPRES) 0.1 MG tablet Take 1 tablet (0.1 mg total) by mouth 2 (two) times daily.  . furosemide (LASIX) 20 MG tablet Take 1 tablet (20 mg total) by mouth daily.  . indapamide (LOZOL) 1.25 MG tablet TAKE 1 TABLET (1.25 MG TOTAL) BY MOUTH DAILY.  Marland Kitchen Insulin Glargine (LANTUS SOLOSTAR) 100 UNIT/ML Solostar Pen Inject 15 Units into the skin at bedtime.  . Insulin Lispro (HUMALOG KWIKPEN [place]) Inject 5-11 Units into the skin 3 (three) times daily with meals.  . Multiple Vitamin (MULTIVITAMIN WITH MINERALS) TABS tablet Take 1 tablet by mouth daily.  . pantoprazole (PROTONIX) 40 MG tablet Take 1 tablet (40 mg total) by mouth daily.   . potassium chloride (K-DUR) 10 MEQ tablet Take 1 tablet (10 mEq total) by mouth daily.  Marland Kitchen triamterene-hydrochlorothiazide (DYAZIDE) 37.5-25 MG capsule TAKE 1 EACH (1 CAPSULE TOTAL) BY MOUTH DAILY.  . [DISCONTINUED] Insulin Glargine (LANTUS SOLOSTAR [place]) Inject 20 Units into the skin at bedtime.  . [DISCONTINUED] insulin lispro (HUMALOG) 100 UNIT/ML injection 5 units with meals   No facility-administered encounter medications on file as of 06/25/2015.   ALLERGIES: No Known Allergies VACCINATION STATUS: Immunization History  Administered Date(s) Administered  . Influenza,inj,Quad PF,36+ Mos 05/17/2015  . Pneumococcal-Unspecified 12/22/2010    Diabetes She presents for her initial diabetic visit. She has type 2 diabetes mellitus. Onset time: She was diagnosed at approximate age of 5 years. Her disease course has been worsening. There are no hypoglycemic associated symptoms. Pertinent negatives for hypoglycemia include no confusion, headaches, pallor or seizures. Associated symptoms include foot paresthesias and visual change. Pertinent negatives for diabetes include no chest pain, no fatigue, no polydipsia, no polyphagia and no polyuria. There are no hypoglycemic complications. Symptoms are worsening. 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 She relates that she has seen nephrology in the past but has not kept her appointments. ) Risk factors for coronary artery disease include diabetes mellitus, dyslipidemia, sedentary lifestyle, tobacco exposure and hypertension.  Her weight is increasing steadily (She has seen increasing swelling on her bilateral legs associated with weight gain of 10-15 pounds in the last 4 weeks.). She is following a generally unhealthy diet. When asked about meal planning, she reported none. She has not had a previous visit with a dietitian (She declines referral to a dietitian.). She participates in exercise intermittently. Her home blood glucose trend  is decreasing steadily. Her breakfast blood glucose range is generally 70-90 mg/dl. Her lunch blood glucose range is generally 110-130 mg/dl. Her dinner blood glucose range is generally 110-130 mg/dl. Her overall blood glucose range is 110-130 mg/dl. An ACE inhibitor/angiotensin II receptor blocker is not being taken. She does not see a podiatrist.Eye exam is not current.  Hyperlipidemia This is a chronic problem. The current episode started more than 1 year ago. Pertinent negatives include no chest pain, myalgias or shortness of breath. Current antihyperlipidemic treatment includes statins. Risk factors for coronary artery disease include dyslipidemia, diabetes mellitus, hypertension and a sedentary lifestyle.  Hypertension This is a chronic problem. The current episode started more than 1 year ago. Pertinent negatives include no chest pain, headaches, palpitations or shortness of breath. Risk factors for coronary artery disease include smoking/tobacco exposure, diabetes mellitus and dyslipidemia. Past treatments include central alpha agonists. Hypertensive end-organ damage includes kidney disease.      Review of Systems  Constitutional: Negative for fever, chills, fatigue and unexpected weight change.  HENT: Negative for trouble swallowing and voice change.   Eyes: Negative for visual disturbance.  Respiratory: Negative for cough, shortness of breath and wheezing.   Cardiovascular: Negative for chest pain, palpitations and leg swelling.  Gastrointestinal: Negative for nausea, vomiting and diarrhea.  Endocrine: Negative for cold intolerance, heat intolerance, polydipsia, polyphagia and polyuria.  Musculoskeletal: Negative for myalgias and arthralgias.  Skin: Negative for color change, pallor, rash and wound.  Neurological: Negative for seizures and headaches.  Psychiatric/Behavioral: Negative for suicidal ideas and confusion.    Objective:    BP 169/97 mmHg  Pulse 88  Ht 5\' 6"  (1.676 m)   Wt 169 lb (76.658 kg)  BMI 27.29 kg/m2  SpO2 96%  Wt Readings from Last 3 Encounters:  06/25/15 169 lb (76.658 kg)  06/17/15 173 lb (78.472 kg)  05/17/15 159 lb (72.122 kg)    Physical Exam  Constitutional: She is oriented to person, place, and time. She appears well-developed.  HENT:  Head: Normocephalic and atraumatic.  Eyes: EOM are normal.  Neck: Normal range of motion. Neck supple. No tracheal deviation present. No thyromegaly present.  Cardiovascular: Normal rate and regular rhythm.   Pulmonary/Chest: Effort normal and breath sounds normal.  Abdominal: Soft. Bowel sounds are normal. There is no tenderness. There is no guarding.  Musculoskeletal: Normal range of motion. She exhibits no edema.  Neurological: She is alert and oriented to person, place, and time. She has normal reflexes. No cranial nerve deficit. Coordination normal.  Skin: Skin is warm and dry. No rash noted. No erythema. No pallor.  Psychiatric: She has a normal mood and affect. Judgment normal.     CMP ( most recent) CMP     Component Value Date/Time   NA 137 05/17/2015 1004   NA 132* 05/15/2015 1349   K 4.4 05/17/2015 1004   CL 103 05/17/2015 1004   CO2 26 05/17/2015 1004   GLUCOSE 343* 05/17/2015 1004   GLUCOSE 658* 05/15/2015 1349   BUN 47* 05/17/2015 1004   BUN 46* 05/15/2015 1349   CREATININE 4.14* 05/17/2015  1004   CREATININE 2.37* 06/11/2014 1519   CALCIUM 8.3* 05/17/2015 1004   PROT 6.5 08/29/2014 0853   ALBUMIN 4.2 08/29/2014 0853   AST 33 08/29/2014 0853   ALT 29 08/29/2014 0853   ALKPHOS 83 08/29/2014 0853   BILITOT 0.2 08/29/2014 0853   GFRNONAA 11* 05/17/2015 1004   GFRAA 12* 05/17/2015 1004     Diabetic Labs (most recent): Lab Results  Component Value Date   HGBA1C 12.1* 05/15/2015   HGBA1C 6.9 08/27/2014   HGBA1C 10.8* 05/09/2014     Lipid Panel ( most recent) Lipid Panel     Component Value Date/Time   CHOL 204* 08/29/2014 0853   CHOL 344* 05/10/2014 0719   TRIG  113 08/29/2014 0853   HDL 75 08/29/2014 0853   HDL 83 05/10/2014 0719   CHOLHDL 2.7 08/29/2014 0853   CHOLHDL 4.1 05/10/2014 0719   VLDL 35 05/10/2014 0719   LDLCALC 106* 08/29/2014 0853   LDLCALC 226* 05/10/2014 0719      Assessment & Plan:   1. Type 2 diabetes mellitus with stage 5 chronic kidney disease not on chronic dialysis, with long-term current use of insulin ([place])   - Patient has currently uncontrolled symptomatic type 2 DM since  65 years of age,  with most recent A1c of 12.1 %. Recent labs reviewed.   Her diabetes is complicated by stage 5 CKD and patient remains at a high risk for more acute and chronic complications of diabetes which include CAD, CVA, CKD, retinopathy, and neuropathy. These are all discussed in detail with the patient.  - I have counseled the patient on diet management and weight loss, by adopting a carbohydrate restricted/protein rich diet.  - Suggestion is made for patient to avoid simple carbohydrates   from their diet including Cakes , Desserts, Ice Cream,  Soda (  diet and regular) , Sweet Tea , Candies,  Chips, Cookies, Artificial Sweeteners,   and "Sugar-free" Products . This will help patient to have stable blood glucose profile and potentially avoid unintended weight gain.  - I encouraged the patient to switch to  unprocessed or minimally processed complex starch and increased protein intake (animal or plant source), fruits, and vegetables.  - Patient is advised to stick to a routine mealtimes to eat 3 meals  a day and avoid unnecessary snacks ( to snack only to correct hypoglycemia).  - The patient has declines a  CDE consult for individualized DM education.  - I have approached patient with the following individualized plan to manage diabetes and patient agrees:   - Since her last visit, she came with significantly improved blood glucose profile. -She did have tighter blood glucose readings in the morning.  I  will proceed to lower her basal  insulin Levemir to 15 units QHS, and prandial insulin Humalog 5 units TIDAC for pre-meal BG readings of 90-150mg /dl, plus patient specific correction dose for unexpected hyperglycemia above 150mg /dl, associated with strict monitoring of glucose  AC and HS. - Patient is warned not to take insulin without proper monitoring per orders. -Adjustment parameters are given for hypo and hyperglycemia in writing. -Patient is encouraged to call clinic for blood glucose levels less than 70 or above 300 mg /dl.  -Patient is not a candidate for metformin,SGLT2 i due to CKD. - Her visit to nephrology in [place] is pending. This is per her request . - Patient will be considered for incretin therapy as appropriate next visit. - Patient specific target  A1c;  LDL, HDL, Triglycerides, and  Waist Circumference were discussed in detail.  2) BP/HTN: uncontrolled. Continue current medications including clonidine. I will consider low-dose ace inhibitor next visit. 3) Lipids/HPL:  uncontrolled , LDL at 106 continue statins. 4)  Weight/Diet:  She declines CDE Consult, exercise, and detailed carbohydrates information provided.  5) Chronic Care/Health Maintenance:  -Patient is on Statin medications and encouraged to continue to follow up with Ophthalmology, Podiatrist at least yearly or according to recommendations, and advised to   stay away from smoking. I have recommended yearly flu vaccine and pneumonia vaccination at least every 5 years; moderate intensity exercise for up to 150 minutes weekly; and  sleep for at least 7 hours a day.  -  25 minutes of time was spent on the care of this patient , 50% of which was applied for counseling on diabetes complications and their preventions.  - Patient to bring meter and  blood glucose logs during their next visit.   - I advised patient to maintain close follow up with Sallee Lange, MD for primary care needs.  Follow up plan: - Return in about 8 weeks (around 08/20/2015)  for diabetes, high blood pressure, high cholesterol, follow up with pre-visit labs, meter, and logs.  Glade Lloyd, MD Phone: [phone]  Fax: [phone]   06/25/2015, 5:27 PM

## 2015-07-07 ENCOUNTER — Other Ambulatory Visit: Payer: Self-pay | Admitting: Family Medicine

## 2015-07-17 ENCOUNTER — Other Ambulatory Visit: Payer: Self-pay | Admitting: "Endocrinology

## 2015-07-17 ENCOUNTER — Other Ambulatory Visit: Payer: Self-pay | Admitting: Family Medicine

## 2015-07-30 ENCOUNTER — Other Ambulatory Visit: Payer: Self-pay | Admitting: *Deleted

## 2015-07-30 MED ORDER — PANTOPRAZOLE SODIUM 40 MG PO TBEC: 40.0000 mg | DELAYED_RELEASE_TABLET | Freq: Every day | ORAL | Status: DC

## 2015-08-07 ENCOUNTER — Encounter: Payer: Self-pay | Admitting: Family Medicine

## 2015-08-09 ENCOUNTER — Telehealth: Payer: Self-pay | Admitting: *Deleted

## 2015-08-09 NOTE — Telephone Encounter (Signed)
Send to dr nida per dr Nicki Reaper

## 2015-08-09 NOTE — Telephone Encounter (Signed)
Request from [facility] for levemir. Pt referred to dr nida and saw him in April. Do you want to refill or send request to dr nida.

## 2015-08-09 NOTE — Telephone Encounter (Signed)
Fax sent to dr Dorris Fetch

## 2015-08-12 ENCOUNTER — Other Ambulatory Visit: Payer: Self-pay | Admitting: Family Medicine

## 2015-08-14 ENCOUNTER — Other Ambulatory Visit: Payer: Self-pay | Admitting: "Endocrinology

## 2015-08-14 LAB — HEMOGLOBIN A1C
Hgb A1c MFr Bld: 10 % — ABNORMAL HIGH (ref <5.7)
Mean Plasma Glucose: 240 mg/dL

## 2015-08-14 LAB — LIPID PANEL
Cholesterol: 183 mg/dL (ref 125–200)
HDL: 64 mg/dL (ref >=46)
LDL Cholesterol: 99 mg/dL (ref <130)
Total CHOL/HDL Ratio: 2.9 Ratio (ref <=5.0)
Triglycerides: 99 mg/dL (ref <150)
VLDL: 20 mg/dL (ref <30)

## 2015-08-14 LAB — BASIC METABOLIC PANEL
BUN: 72 mg/dL — ABNORMAL HIGH (ref 7–25)
CO2: 20 mmol/L (ref 20–31)
Calcium: 8.3 mg/dL — ABNORMAL LOW (ref 8.6–10.4)
Chloride: 105 mmol/L (ref 98–110)
Creat: 6.21 mg/dL — ABNORMAL HIGH (ref 0.50–0.99)
Glucose, Bld: 178 mg/dL — ABNORMAL HIGH (ref 65–99)
Potassium: 4.4 mmol/L (ref 3.5–5.3)
Sodium: 141 mmol/L (ref 135–146)

## 2015-08-14 LAB — TSH: TSH: 1.74 mIU/L

## 2015-08-14 LAB — T4, FREE: Free T4: 1.1 ng/dL (ref 0.8–1.8)

## 2015-08-15 ENCOUNTER — Other Ambulatory Visit (HOSPITAL_COMMUNITY): Payer: BLUE CROSS/BLUE SHIELD

## 2015-08-15 ENCOUNTER — Ambulatory Visit: Payer: BLUE CROSS/BLUE SHIELD | Admitting: Vascular Surgery

## 2015-08-15 ENCOUNTER — Encounter (HOSPITAL_COMMUNITY): Payer: BLUE CROSS/BLUE SHIELD

## 2015-08-16 ENCOUNTER — Other Ambulatory Visit: Payer: Self-pay | Admitting: *Deleted

## 2015-08-16 DIAGNOSIS — N185 Chronic kidney disease, stage 5: Secondary | ICD-10-CM

## 2015-08-16 DIAGNOSIS — Z01810 Encounter for preprocedural cardiovascular examination: Secondary | ICD-10-CM

## 2015-08-20 ENCOUNTER — Encounter: Payer: Self-pay | Admitting: "Endocrinology

## 2015-08-20 ENCOUNTER — Ambulatory Visit (INDEPENDENT_AMBULATORY_CARE_PROVIDER_SITE_OTHER): Payer: BLUE CROSS/BLUE SHIELD | Admitting: "Endocrinology

## 2015-08-20 VITALS — BP 160/78 | HR 63 | Ht 66.0 in | Wt 180.0 lb

## 2015-08-20 DIAGNOSIS — E785 Hyperlipidemia, unspecified: Secondary | ICD-10-CM | POA: Diagnosis not present

## 2015-08-20 DIAGNOSIS — E1165 Type 2 diabetes mellitus with hyperglycemia: Secondary | ICD-10-CM

## 2015-08-20 DIAGNOSIS — N186 End stage renal disease: Secondary | ICD-10-CM

## 2015-08-20 DIAGNOSIS — IMO0002 Reserved for concepts with insufficient information to code with codable children: Secondary | ICD-10-CM

## 2015-08-20 DIAGNOSIS — E1122 Type 2 diabetes mellitus with diabetic chronic kidney disease: Secondary | ICD-10-CM

## 2015-08-20 DIAGNOSIS — I10 Essential (primary) hypertension: Secondary | ICD-10-CM

## 2015-08-20 DIAGNOSIS — N185 Chronic kidney disease, stage 5: Secondary | ICD-10-CM | POA: Diagnosis not present

## 2015-08-20 DIAGNOSIS — Z9119 Patient's noncompliance with other medical treatment and regimen: Secondary | ICD-10-CM | POA: Diagnosis not present

## 2015-08-20 DIAGNOSIS — Z91199 Patient's noncompliance with other medical treatment and regimen due to unspecified reason: Secondary | ICD-10-CM | POA: Insufficient documentation

## 2015-08-20 NOTE — Progress Notes (Signed)
Subjective:    Patient ID: Shelley [name], female    DOB: [birth year]. Patient is  to follow up for  management of diabetes requested by  Sallee Lange, MD  Past Medical History  Diagnosis Date  . Hypertension   . Diabetes mellitus without complication ([place])   . HTN (hypertension)   . Diabetes ([place])   . Hyperlipidemia    Past Surgical History  Procedure Laterality Date  . Cataract extraction Bilateral   . Appendectomy    . Yag laser application Left 0000000    Procedure: YAG LASER APPLICATION;  Surgeon: Williams Che, MD;  Location: [facility];  Service: Ophthalmology;  Laterality: Left;  . Abdominal hysterectomy     Social History   Social History  . Marital Status: Single    Spouse Name: N/A  . Number of Children: N/A  . Years of Education: N/A   Social History Main Topics  . Smoking status: Never Smoker   . Smokeless tobacco: Not on file  . Alcohol Use: No  . Drug Use: No  . Sexual Activity: Not on file   Other Topics Concern  . Not on file   Social History Narrative   Outpatient Encounter Prescriptions as of 08/20/2015  Medication Sig  . calcitRIOL (ROCALTROL) 0.25 MCG capsule Take 0.25 mcg by mouth daily.  . hydrALAZINE (APRESOLINE) 100 MG tablet Take 100 mg by mouth 3 (three) times daily.  . promethazine (PHENERGAN) 12.5 MG tablet Take 12.5 mg by mouth as needed for nausea or vomiting.  Marland Kitchen amLODipine (NORVASC) 5 MG tablet TAKE 1 TABLET (5 MG TOTAL) BY MOUTH DAILY. (Patient taking differently: 10 mg. TAKE 1 TABLET (5 MG TOTAL) BY MOUTH DAILY.)  . aspirin EC 81 MG tablet Take 81 mg by mouth daily.  Marland Kitchen atorvastatin (LIPITOR) 40 MG tablet TAKE 1 TABLET (40 MG TOTAL) BY MOUTH DAILY.  . cloNIDine (CATAPRES) 0.1 MG tablet TAKE 1 TABLET BY MOUTH TWICE A DAY  . furosemide (LASIX) 20 MG tablet TAKE 1 TABLET (20 MG TOTAL) BY MOUTH DAILY. (Patient taking differently: TAKE 80MG  TOTAL BY MOUTH DAILY.)  . Insulin Glargine (LANTUS SOLOSTAR) 100 UNIT/ML Solostar Pen Inject 15  Units into the skin at bedtime.  . Insulin Lispro (HUMALOG KWIKPEN [place]) Inject 5-11 Units into the skin 3 (three) times daily with meals.  Marland Kitchen LEVEMIR FLEXTOUCH 100 UNIT/ML Pen INJECT 8 UNITS INTO THE SKIN AT BEDTIME. MAY TITRATE UP TO 30 UNITS (SPLIT BILL)  . Multiple Vitamin (MULTIVITAMIN WITH MINERALS) TABS tablet Take 1 tablet by mouth daily.  . pantoprazole (PROTONIX) 40 MG tablet Take 1 tablet (40 mg total) by mouth daily.  Marland Kitchen triamterene-hydrochlorothiazide (DYAZIDE) 37.5-25 MG capsule TAKE 1 EACH (1 CAPSULE TOTAL) BY MOUTH DAILY.  . [DISCONTINUED] cloNIDine (CATAPRES) 0.1 MG tablet Take 1 tablet (0.1 mg total) by mouth 2 (two) times daily.  . [DISCONTINUED] indapamide (LOZOL) 1.25 MG tablet TAKE 1 TABLET (1.25 MG TOTAL) BY MOUTH DAILY.  . [DISCONTINUED] potassium chloride (K-DUR) 10 MEQ tablet Take 1 tablet (10 mEq total) by mouth daily.   No facility-administered encounter medications on file as of 08/20/2015.   ALLERGIES: No Known Allergies VACCINATION STATUS: Immunization History  Administered Date(s) Administered  . Influenza,inj,Quad PF,36+ Mos 05/17/2015  . Pneumococcal-Unspecified 12/22/2010    Diabetes She presents for her initial diabetic visit. She has type 2 diabetes mellitus. Onset time: She was diagnosed at approximate age of 12 years. Her disease course has been improving. There are no hypoglycemic associated symptoms.  Pertinent negatives for hypoglycemia include no confusion, headaches, pallor or seizures. Associated symptoms include foot paresthesias and visual change. Pertinent negatives for diabetes include no chest pain, no fatigue, no polydipsia, no polyphagia and no polyuria. There are no hypoglycemic complications. Symptoms are improving. Diabetic complications include nephropathy. (She has stage 5 CKD, she states that she has seen her nephrologist in [place] and being considered for hemodialysis to start  in 2 weeks. ) Risk factors for coronary artery disease  include diabetes mellitus, dyslipidemia, sedentary lifestyle, tobacco exposure and hypertension. Her weight is increasing steadily (She has seen increasing swelling on her bilateral feet and legs and gained 10 pounds of weight since last visit.). She is following a generally unhealthy diet. When asked about meal planning, she reported none. She has not had a previous visit with a dietitian (She declines referral to a dietitian.). She participates in exercise intermittently. Home blood sugar record trend: She did not stay committed on basal/bolus insulin. She monitored only once a day for 6 weeks. Her A1c is improved but still high at 10%. Her overall blood glucose range is >200 mg/dl. An ACE inhibitor/angiotensin II receptor blocker is not being taken. She does not see a podiatrist.Eye exam is not current.  Hyperlipidemia This is a chronic problem. The current episode started more than 1 year ago. Pertinent negatives include no chest pain, myalgias or shortness of breath. Current antihyperlipidemic treatment includes statins. Risk factors for coronary artery disease include dyslipidemia, diabetes mellitus, hypertension and a sedentary lifestyle.  Hypertension This is a chronic problem. The current episode started more than 1 year ago. Pertinent negatives include no chest pain, headaches, palpitations or shortness of breath. Risk factors for coronary artery disease include smoking/tobacco exposure, diabetes mellitus and dyslipidemia. Past treatments include central alpha agonists. Hypertensive end-organ damage includes kidney disease.      Review of Systems  Constitutional: Negative for fever, chills, fatigue and unexpected weight change.  HENT: Negative for trouble swallowing and voice change.   Eyes: Negative for visual disturbance.  Respiratory: Negative for cough, shortness of breath and wheezing.   Cardiovascular: Negative for chest pain, palpitations and leg swelling.  Gastrointestinal: Negative  for nausea, vomiting and diarrhea.  Endocrine: Negative for cold intolerance, heat intolerance, polydipsia, polyphagia and polyuria.  Musculoskeletal: Negative for myalgias and arthralgias.  Skin: Negative for color change, pallor, rash and wound.  Neurological: Negative for seizures and headaches.  Psychiatric/Behavioral: Negative for suicidal ideas and confusion.    Objective:    BP 160/78 mmHg  Pulse 63  Ht 5\' 6"  (1.676 m)  Wt 180 lb (81.647 kg)  BMI 29.07 kg/m2  Wt Readings from Last 3 Encounters:  08/20/15 180 lb (81.647 kg)  06/25/15 169 lb (76.658 kg)  06/17/15 173 lb (78.472 kg)    Physical Exam  Constitutional: She is oriented to person, place, and time. She appears well-developed.  HENT:  Head: Normocephalic and atraumatic.  Eyes: EOM are normal.  Neck: Normal range of motion. Neck supple. No tracheal deviation present. No thyromegaly present.  Cardiovascular: Normal rate and regular rhythm.   Pulmonary/Chest: Effort normal and breath sounds normal.  Abdominal: Soft. Bowel sounds are normal. There is no tenderness. There is no guarding.  Musculoskeletal: Normal range of motion. She exhibits no edema.  Neurological: She is alert and oriented to person, place, and time. She has normal reflexes. No cranial nerve deficit. Coordination normal.  Skin: Skin is warm and dry. No rash noted. No erythema. No pallor.  Psychiatric: She  has a normal mood and affect. Judgment normal.    CMP     Component Value Date/Time   NA 141 08/14/2015 1037   NA 132* 05/15/2015 1349   K 4.4 08/14/2015 1037   CL 105 08/14/2015 1037   CO2 20 08/14/2015 1037   GLUCOSE 178* 08/14/2015 1037   GLUCOSE 658* 05/15/2015 1349   BUN 72* 08/14/2015 1037   BUN 46* 05/15/2015 1349   CREATININE 6.21* 08/14/2015 1037   CREATININE 4.14* 05/17/2015 1004   CALCIUM 8.3* 08/14/2015 1037   PROT 6.5 08/29/2014 0853   ALBUMIN 4.2 08/29/2014 0853   AST 33 08/29/2014 0853   ALT 29 08/29/2014 0853   ALKPHOS  83 08/29/2014 0853   BILITOT 0.2 08/29/2014 0853   GFRNONAA 11* 05/17/2015 1004   GFRAA 12* 05/17/2015 1004    Diabetic Labs (most recent): Lab Results  Component Value Date   HGBA1C 10.0* 08/14/2015   HGBA1C 12.1* 05/15/2015   HGBA1C 6.9 08/27/2014     Lipid Panel ( most recent) Lipid Panel     Component Value Date/Time   CHOL 183 08/14/2015 1037   CHOL 204* 08/29/2014 0853   TRIG 99 08/14/2015 1037   HDL 64 08/14/2015 1037   HDL 75 08/29/2014 0853   CHOLHDL 2.9 08/14/2015 1037   CHOLHDL 2.7 08/29/2014 0853   VLDL 20 08/14/2015 1037   LDLCALC 99 08/14/2015 1037   LDLCALC 106* 08/29/2014 0853      Assessment & Plan:   1. Type 2 diabetes mellitus with stage 5 chronic kidney disease not on chronic dialysis, with long-term current use of insulin ([place])   - Patient has currently uncontrolled symptomatic type 2 DM since  65 years of age,  with most recent A1c of  10% slowly improving 12.1 %. Recent labs reviewed, showing into stage renal disease being prepared for hemodialysis by her nephrology group in [place].  - Her diabetes is complicated mainly due to prior history of chronic noncompliance.  -She remains at a high risk for more acute and chronic complications of diabetes which include CAD, CVA, retinopathy, and neuropathy. These are all discussed in detail with the patient.  - I have counseled the patient on diet management and weight loss, by adopting a carbohydrate restricted/protein rich diet.  - Suggestion is made for patient to avoid simple carbohydrates   from their diet including Cakes , Desserts, Ice Cream,  Soda (  diet and regular) , Sweet Tea , Candies,  Chips, Cookies, Artificial Sweeteners,   and "Sugar-free" Products . This will help patient to have stable blood glucose profile and potentially avoid unintended weight gain.  - I encouraged the patient to switch to  unprocessed or minimally processed complex starch and increased protein intake (animal or  plant source), fruits, and vegetables.  - Patient is advised to stick to a routine mealtimes to eat 3 meals  a day and avoid unnecessary snacks ( to snack only to correct hypoglycemia).  - The patient has declines a  CDE consult for individualized DM education.  - I have approached patient with the following individualized plan to manage diabetes and patient agrees:   -Once she starts hemodialysis, she may have a chance to treat diabetes with premixed insulin twice a day with breakfast and supper. However given her current high A1c of 10% with EAG of 240 mg per deciliter, and advised her to stay on basal/bolus insulin.  I  will proceed with Levemir  15 units QHS, and  Humalog  5 units TIDAC for pre-meal BG readings of 90-150mg /dl, plus patient specific correction dose for unexpected hyperglycemia above 150mg /dl, associated with strict monitoring of glucose  AC and HS. - Patient is warned not to take insulin without proper monitoring per orders. -Adjustment parameters are given for hypo and hyperglycemia in writing. -Patient is encouraged to call clinic for blood glucose levels less than 70 or above 300 mg /dl.  -Patient is not a candidate for metformin,SGLT2 i due to CKD.  - Patient will be considered for incretin therapy as appropriate after next visit. - Patient specific target  A1c;  LDL, HDL, Triglycerides, and  Waist Circumference were discussed in detail.  2) BP/HTN: uncontrolled. Continue current medications including clonidine. I will consider low-dose ace inhibitor next visit. 3) Lipids/HPL:  uncontrolled , LDL at 106 continue statins. 4)  Weight/Diet:  She declines CDE Consult, exercise, and detailed carbohydrates information provided.  5) Chronic Care/Health Maintenance:  -Patient is on Statin medications and encouraged to continue to follow up with Ophthalmology, Podiatrist at least yearly or according to recommendations, and advised to   stay away from smoking. I have recommended  yearly flu vaccine and pneumonia vaccination at least every 5 years; moderate intensity exercise for up to 150 minutes weekly; and  sleep for at least 7 hours a day.  -  25 minutes of time was spent on the care of this patient , 50% of which was applied for counseling on diabetes complications and their preventions.  - Patient to bring meter and  blood glucose logs during their next visit.   - I advised patient to maintain close follow up with Sallee Lange, MD for primary care needs.  Follow up plan: - Return in about 3 weeks (around 09/10/2015) for follow up with meter and logs- no labs.  Glade Lloyd, MD Phone: [phone]  Fax: [phone]   08/20/2015, 11:04 AM

## 2015-09-05 ENCOUNTER — Encounter (HOSPITAL_COMMUNITY): Payer: BLUE CROSS/BLUE SHIELD

## 2015-09-05 ENCOUNTER — Ambulatory Visit: Payer: BLUE CROSS/BLUE SHIELD | Admitting: Vascular Surgery

## 2015-09-05 ENCOUNTER — Encounter: Payer: Self-pay | Admitting: Vascular Surgery

## 2015-09-05 ENCOUNTER — Other Ambulatory Visit (HOSPITAL_COMMUNITY): Payer: BLUE CROSS/BLUE SHIELD

## 2015-09-13 ENCOUNTER — Ambulatory Visit (HOSPITAL_COMMUNITY): Payer: BLUE CROSS/BLUE SHIELD

## 2015-09-13 ENCOUNTER — Ambulatory Visit: Payer: BLUE CROSS/BLUE SHIELD | Admitting: Vascular Surgery

## 2015-09-13 ENCOUNTER — Ambulatory Visit: Payer: BLUE CROSS/BLUE SHIELD | Admitting: "Endocrinology

## 2015-09-20 ENCOUNTER — Ambulatory Visit: Payer: BLUE CROSS/BLUE SHIELD | Admitting: Vascular Surgery

## 2015-12-24 ENCOUNTER — Telehealth: Payer: Self-pay | Admitting: Family Medicine

## 2015-12-24 NOTE — Telephone Encounter (Signed)
Received Prior Authorization for patient's NovoFine 30G X 1/3 Needles effective 12/24/2015- 12/23/2016/ Pharmacy Notified.

## 2016-01-14 ENCOUNTER — Ambulatory Visit: Payer: BLUE CROSS/BLUE SHIELD | Admitting: Cardiology

## 2016-01-29 NOTE — Progress Notes (Signed)
Erroneous

## 2016-01-30 ENCOUNTER — Encounter: Payer: Self-pay | Admitting: Cardiovascular Disease

## 2016-01-30 ENCOUNTER — Telehealth: Payer: Self-pay | Admitting: Family Medicine

## 2016-01-30 ENCOUNTER — Ambulatory Visit: Payer: BLUE CROSS/BLUE SHIELD | Admitting: Cardiology

## 2016-01-30 ENCOUNTER — Encounter: Payer: BLUE CROSS/BLUE SHIELD | Admitting: Cardiovascular Disease

## 2016-01-30 VITALS — BP 122/60 | HR 84 | Ht 66.0 in | Wt 177.0 lb

## 2016-01-30 DIAGNOSIS — Z1211 Encounter for screening for malignant neoplasm of colon: Secondary | ICD-10-CM

## 2016-01-30 DIAGNOSIS — Z1231 Encounter for screening mammogram for malignant neoplasm of breast: Secondary | ICD-10-CM

## 2016-01-30 NOTE — Telephone Encounter (Signed)
Please assist patient with mammogram how she should go about getting it plus also referral to gastroenterology for colonoscopy

## 2016-01-30 NOTE — Telephone Encounter (Signed)
Pt is needing a woman wellness test, a mammogram referral, and a colonoscopy referral so that she can get on the [facility] transplant list for a kidney. Please advise.   I scheduled the wellness pt is only needing the referrals put in.

## 2016-01-30 NOTE — Telephone Encounter (Signed)
Ordered mammogram and referral for colonoscopy in the system. Gave patient number to [facility] radiology and she will call and schedule mammogram at her convenience.

## 2016-01-31 ENCOUNTER — Encounter: Payer: Self-pay | Admitting: Family Medicine

## 2016-02-03 ENCOUNTER — Other Ambulatory Visit: Payer: Self-pay | Admitting: Family Medicine

## 2016-02-03 NOTE — Telephone Encounter (Signed)
This patient has been under the care of various specialists. I recommend that the patient call the office if she needs this prescription

## 2016-02-05 ENCOUNTER — Ambulatory Visit: Payer: BLUE CROSS/BLUE SHIELD | Admitting: Cardiology

## 2016-02-06 ENCOUNTER — Telehealth: Payer: Self-pay

## 2016-02-06 NOTE — Telephone Encounter (Signed)
Gastroenterology Pre-Procedure Review  Request Date: 02/06/2016 Requesting Physician: Dr. Sallee Lange  PATIENT REVIEW QUESTIONS: The patient responded to the following health history questions as indicated:    1. Diabetes Melitis: YES 2. Joint replacements in the past 12 months: no 3. Major health problems in the past 3 months: YES Kidney failure 4. Has an artificial valve or MVP: no 5. Has a defibrillator: no 6. Has been advised in past to take antibiotics in advance of a procedure like teeth cleaning: no 7. Family history of colon cancer: no  8. Alcohol Use: no 9. History of sleep apnea: no  10. History of coronary artery or other vascular stents placed within the last 12 months: no    MEDICATIONS & ALLERGIES:    Patient reports the following regarding taking any blood thinners:   Plavix? no Aspirin? YES Coumadin? no Brilinta? no Xarelto? no Eliquis? no Pradaxa? no Savaysa? no Effient? no  Patient confirms/reports the following medications:  Current Outpatient Prescriptions  Medication Sig Dispense Refill  . amLODipine (NORVASC) 10 MG tablet Take by mouth.    Marland Kitchen aspirin EC 81 MG tablet Take 81 mg by mouth daily.    Marland Kitchen atorvastatin (LIPITOR) 40 MG tablet TAKE 1 TABLET (40 MG TOTAL) BY MOUTH DAILY. 90 tablet 1  . calcitRIOL (ROCALTROL) 0.25 MCG capsule Take 0.25 mcg by mouth daily.    . cloNIDine (CATAPRES) 0.1 MG tablet TAKE 1 TABLET BY MOUTH TWICE A DAY (Patient taking differently: now taking once daily) 180 tablet 0  . Insulin Glargine (LANTUS SOLOSTAR) 100 UNIT/ML Solostar Pen Inject 15 Units into the skin at bedtime.    . Insulin Lispro (HUMALOG KWIKPEN [place]) Inject 5 Units into the skin 3 (three) times daily with meals.     . Multiple Vitamin (MULTIVITAMIN WITH MINERALS) TABS tablet Take 1 tablet by mouth daily.    Marland Kitchen LEVEMIR FLEXTOUCH 100 UNIT/ML Pen INJECT 8 UNITS INTO THE SKIN AT BEDTIME. MAY TITRATE UP TO 30 UNITS (SPLIT BILL) (Patient not taking: Reported on  02/06/2016) 15 mL 1   No current facility-administered medications for this visit.     Patient confirms/reports the following allergies:  No Known Allergies  No orders of the defined types were placed in this encounter.   AUTHORIZATION INFORMATION Primary Insurance:   ID #:   Group #:  Pre-Cert / Auth required:  Pre-Cert / Auth #:   Secondary Insurance:   ID #:   Group #:  Pre-Cert / Auth required:  Pre-Cert / Auth #:   SCHEDULE INFORMATION: Procedure has been scheduled as follows:  Date:                Time:   Location:   This Gastroenterology Pre-Precedure Review Form is being routed to the following provider(s): R. Garfield Cornea, MD

## 2016-02-10 NOTE — Telephone Encounter (Signed)
OK to schedule.  Day of prep: Lantus 7unit at bedtime, Humalog 2 units with meals.  SHE SHOULD USE TAP WATER ENEMA INSTEAD OF FLEETS ENEMA WITH HER PREP.

## 2016-02-17 NOTE — Telephone Encounter (Signed)
LMOM to call.

## 2016-02-18 NOTE — Telephone Encounter (Signed)
Pt called and is scheduled for 12/21/2017at 9:30 Am with Dr. Gala Romney.

## 2016-02-19 ENCOUNTER — Other Ambulatory Visit: Payer: Self-pay

## 2016-02-19 DIAGNOSIS — Z1211 Encounter for screening for malignant neoplasm of colon: Secondary | ICD-10-CM

## 2016-02-19 MED ORDER — PEG 3350-KCL-NA BICARB-NACL 420 G PO SOLR: 4000.0000 mL | ORAL | 0 refills | Status: DC

## 2016-02-19 NOTE — Telephone Encounter (Signed)
Rx sent to the pharmacy and instructions mailed to pt.  

## 2016-02-20 ENCOUNTER — Ambulatory Visit (HOSPITAL_COMMUNITY)
Admission: RE | Admit: 2016-02-20 | Discharge: 2016-02-20 | Disposition: A | Discharge status: Home / Self Care | Payer: Medicare Other | Source: Ambulatory Visit | Attending: Family Medicine | Admitting: Family Medicine

## 2016-02-20 ENCOUNTER — Ambulatory Visit (INDEPENDENT_AMBULATORY_CARE_PROVIDER_SITE_OTHER): Payer: BLUE CROSS/BLUE SHIELD | Admitting: Family Medicine

## 2016-02-20 ENCOUNTER — Encounter: Payer: Self-pay | Admitting: Family Medicine

## 2016-02-20 VITALS — BP 138/90 | Ht 64.75 in | Wt 179.0 lb

## 2016-02-20 DIAGNOSIS — R928 Other abnormal and inconclusive findings on diagnostic imaging of breast: Secondary | ICD-10-CM | POA: Insufficient documentation

## 2016-02-20 DIAGNOSIS — Z124 Encounter for screening for malignant neoplasm of cervix: Secondary | ICD-10-CM | POA: Diagnosis not present

## 2016-02-20 DIAGNOSIS — Z0000 Encounter for general adult medical examination without abnormal findings: Secondary | ICD-10-CM

## 2016-02-20 DIAGNOSIS — Z1231 Encounter for screening mammogram for malignant neoplasm of breast: Secondary | ICD-10-CM | POA: Insufficient documentation

## 2016-02-20 NOTE — Progress Notes (Signed)
   Subjective:    Patient ID: Shelley [name], female    DOB: 1951-03-19, 65 y.o.   MRN: [id number]  HPI The patient comes in today for a wellness visit.  Mammogram scheduled for today.   Colonoscopy scheduled for December.  A review of their health history was completed.  A review of medications was also completed.  Any needed refills; none  Eating habits: health conscious  Falls/  MVA accidents in past few months: none  Regular exercise: walking  Specialist pt sees on regular basis: Dr. Dorris Fetch - diabetes, kidney doctor, and dialysis specialist.   Preventative health issues were discussed.   Additional concerns: none    Review of Systems  Constitutional: Positive for fatigue. Negative for activity change, appetite change and fever.  HENT: Negative for congestion, ear discharge and rhinorrhea.   Eyes: Negative for discharge.  Respiratory: Negative for cough, chest tightness and wheezing.   Cardiovascular: Negative for chest pain.  Gastrointestinal: Negative for abdominal pain and vomiting.  Genitourinary: Negative for difficulty urinating and frequency.  Musculoskeletal: Negative for neck pain.  Allergic/Immunologic: Negative for environmental allergies and food allergies.  Neurological: Negative for weakness and headaches.  Psychiatric/Behavioral: Negative for agitation and behavioral problems.       Objective:   Physical Exam  Constitutional: She is oriented to person, place, and time. She appears well-developed and well-nourished.  HENT:  Head: Normocephalic.  Right Ear: External ear normal.  Left Ear: External ear normal.  Eyes: Pupils are equal, round, and reactive to light.  Neck: Normal range of motion. No thyromegaly present.  Cardiovascular: Normal rate, regular rhythm, normal heart sounds and intact distal pulses.   No murmur heard. Pulmonary/Chest: Effort normal and breath sounds normal. No respiratory distress. She has no wheezes.  Breast exam normal no  masses felt  Abdominal: Soft. Bowel sounds are normal. She exhibits no distension and no mass. There is no tenderness.  Genitourinary:  Genitourinary Comments: Patient has had a abdominal hysterectomy complete. Visual appearance is normal. Vulva normal.  Musculoskeletal: Normal range of motion. She exhibits no edema or tenderness.  Lymphadenopathy:    She has no cervical adenopathy.  Neurological: She is alert and oriented to person, place, and time. She exhibits normal muscle tone.  Skin: Skin is warm and dry.  Psychiatric: She has a normal mood and affect. Her behavior is normal.          Assessment & Plan:  Adult wellness-complete.wellness physical was conducted today. Importance of diet and exercise were discussed in detail. In addition to this a discussion regarding safety was also covered. We also reviewed over immunizations and gave recommendations regarding current immunization needed for age. In addition to this additional areas were also touched on including: Preventative health exams needed: Colonoscopy Patient will be doing colonoscopy in December 2017  Patient was advised yearly wellness exam Patient will be entering herself into the transplant list at [facility] for renal failure. Her physical exam overall is good. She is having a mammogram later today. She is having colonoscopy in December. We will forward the results of this to her coordinator.

## 2016-02-24 ENCOUNTER — Other Ambulatory Visit: Payer: Self-pay | Admitting: Family Medicine

## 2016-02-24 DIAGNOSIS — R928 Other abnormal and inconclusive findings on diagnostic imaging of breast: Secondary | ICD-10-CM

## 2016-02-25 ENCOUNTER — Other Ambulatory Visit: Payer: Self-pay

## 2016-02-25 DIAGNOSIS — Z1211 Encounter for screening for malignant neoplasm of colon: Secondary | ICD-10-CM

## 2016-02-25 LAB — PAP IG W/ RFLX HPV ASCU: PAP Smear Comment: 0

## 2016-03-10 ENCOUNTER — Ambulatory Visit (HOSPITAL_COMMUNITY)
Admission: RE | Admit: 2016-03-10 | Discharge: 2016-03-10 | Disposition: A | Discharge status: Home / Self Care | Payer: BLUE CROSS/BLUE SHIELD | Source: Ambulatory Visit | Attending: Family Medicine | Admitting: Family Medicine

## 2016-03-10 ENCOUNTER — Telehealth: Payer: Self-pay

## 2016-03-10 DIAGNOSIS — R928 Other abnormal and inconclusive findings on diagnostic imaging of breast: Secondary | ICD-10-CM | POA: Insufficient documentation

## 2016-03-10 NOTE — Telephone Encounter (Signed)
I called [employer] and spoke to [facility] who said a PA is not required for the screening colonoscopy. Ref # [id number].

## 2016-03-12 ENCOUNTER — Encounter (HOSPITAL_COMMUNITY)
Admission: RE | Disposition: A | Discharge status: Home / Self Care | Payer: Self-pay | Source: Ambulatory Visit | Attending: Internal Medicine

## 2016-03-12 ENCOUNTER — Encounter (HOSPITAL_COMMUNITY): Payer: Self-pay | Admitting: Anesthesiology

## 2016-03-12 ENCOUNTER — Ambulatory Visit (HOSPITAL_COMMUNITY)
Admission: RE | Admit: 2016-03-12 | Discharge: 2016-03-12 | Disposition: A | Discharge status: Home / Self Care | Payer: BLUE CROSS/BLUE SHIELD | Source: Ambulatory Visit | Attending: Internal Medicine | Admitting: Internal Medicine

## 2016-03-12 ENCOUNTER — Encounter (HOSPITAL_COMMUNITY): Payer: Self-pay | Admitting: *Deleted

## 2016-03-12 DIAGNOSIS — Z01818 Encounter for other preprocedural examination: Secondary | ICD-10-CM | POA: Diagnosis not present

## 2016-03-12 DIAGNOSIS — Z1212 Encounter for screening for malignant neoplasm of rectum: Secondary | ICD-10-CM

## 2016-03-12 DIAGNOSIS — Z7982 Long term (current) use of aspirin: Secondary | ICD-10-CM | POA: Insufficient documentation

## 2016-03-12 DIAGNOSIS — E1122 Type 2 diabetes mellitus with diabetic chronic kidney disease: Secondary | ICD-10-CM | POA: Diagnosis not present

## 2016-03-12 DIAGNOSIS — Z1211 Encounter for screening for malignant neoplasm of colon: Secondary | ICD-10-CM | POA: Diagnosis not present

## 2016-03-12 DIAGNOSIS — Z79899 Other long term (current) drug therapy: Secondary | ICD-10-CM | POA: Insufficient documentation

## 2016-03-12 DIAGNOSIS — E785 Hyperlipidemia, unspecified: Secondary | ICD-10-CM | POA: Insufficient documentation

## 2016-03-12 DIAGNOSIS — K5730 Diverticulosis of large intestine without perforation or abscess without bleeding: Secondary | ICD-10-CM | POA: Insufficient documentation

## 2016-03-12 DIAGNOSIS — Z794 Long term (current) use of insulin: Secondary | ICD-10-CM | POA: Insufficient documentation

## 2016-03-12 DIAGNOSIS — I120 Hypertensive chronic kidney disease with stage 5 chronic kidney disease or end stage renal disease: Secondary | ICD-10-CM | POA: Insufficient documentation

## 2016-03-12 DIAGNOSIS — Z87891 Personal history of nicotine dependence: Secondary | ICD-10-CM | POA: Diagnosis not present

## 2016-03-12 DIAGNOSIS — N186 End stage renal disease: Secondary | ICD-10-CM | POA: Insufficient documentation

## 2016-03-12 DIAGNOSIS — D123 Benign neoplasm of transverse colon: Secondary | ICD-10-CM | POA: Diagnosis not present

## 2016-03-12 HISTORY — DX: End stage renal disease: N18.6

## 2016-03-12 HISTORY — PX: POLYPECTOMY: SHX5525

## 2016-03-12 HISTORY — PX: COLONOSCOPY: SHX5424

## 2016-03-12 LAB — GLUCOSE, CAPILLARY: Glucose-Capillary: 181 mg/dL — ABNORMAL HIGH (ref 65–99)

## 2016-03-12 SURGERY — COLONOSCOPY
Anesthesia: Moderate Sedation

## 2016-03-12 MED ORDER — MIDAZOLAM HCL 5 MG/5ML IJ SOLN
INTRAMUSCULAR | Status: AC
  Filled 2016-03-12: qty 10

## 2016-03-12 MED ORDER — SIMETHICONE 40 MG/0.6ML PO SUSP
ORAL | Status: AC
  Filled 2016-03-12: qty 30

## 2016-03-12 MED ORDER — MIDAZOLAM HCL 5 MG/5ML IJ SOLN
INTRAMUSCULAR | Status: DC | PRN
  Administered 2016-03-12: 2 mg via INTRAVENOUS
  Administered 2016-03-12: 1 mg via INTRAVENOUS
  Administered 2016-03-12: 2 mg via INTRAVENOUS

## 2016-03-12 MED ORDER — ONDANSETRON HCL 4 MG/2ML IJ SOLN
INTRAMUSCULAR | Status: DC | PRN
  Administered 2016-03-12: 4 mg via INTRAVENOUS

## 2016-03-12 MED ORDER — SODIUM CHLORIDE 0.9 % IV SOLN
INTRAVENOUS | Status: DC
  Administered 2016-03-12: 1000 mL via INTRAVENOUS

## 2016-03-12 MED ORDER — ONDANSETRON HCL 4 MG/2ML IJ SOLN
INTRAMUSCULAR | Status: AC
  Filled 2016-03-12: qty 2

## 2016-03-12 MED ORDER — FENTANYL CITRATE (PF) 100 MCG/2ML IJ SOLN
INTRAMUSCULAR | Status: DC | PRN
  Administered 2016-03-12 (×2): 50 ug via INTRAVENOUS

## 2016-03-12 MED ORDER — STERILE WATER FOR IRRIGATION IR SOLN
Status: DC | PRN
  Administered 2016-03-12: 10:00:00

## 2016-03-12 MED ORDER — MEPERIDINE HCL 100 MG/ML IJ SOLN
INTRAMUSCULAR | Status: AC
  Filled 2016-03-12: qty 2

## 2016-03-12 MED ORDER — FENTANYL CITRATE (PF) 100 MCG/2ML IJ SOLN
INTRAMUSCULAR | Status: AC
  Filled 2016-03-12: qty 4

## 2016-03-12 NOTE — Discharge Instructions (Addendum)
Colonoscopy Discharge Instructions  Read the instructions outlined below and refer to this sheet in the next few weeks. These discharge instructions provide you with general information on caring for yourself after you leave the hospital. Your doctor may also give you specific instructions. While your treatment has been planned according to the most current medical practices available, unavoidable complications occasionally occur. If you have any problems or questions after discharge, call Dr. Gala Romney at [phone]. ACTIVITY  You may resume your regular activity, but move at a slower pace for the next 24 hours.   Take frequent rest periods for the next 24 hours.   Walking will help get rid of the air and reduce the bloated feeling in your belly (abdomen).   No driving for 24 hours (because of the medicine (anesthesia) used during the test).    Do not sign any important legal documents or operate any machinery for 24 hours (because of the anesthesia used during the test).  NUTRITION  Drink plenty of fluids.   You may resume your normal diet as instructed by your doctor.   Begin with a light meal and progress to your normal diet. Heavy or fried foods are harder to digest and may make you feel sick to your stomach (nauseated).   Avoid alcoholic beverages for 24 hours or as instructed.  MEDICATIONS  You may resume your normal medications unless your doctor tells you otherwise.  WHAT YOU CAN EXPECT TODAY  Some feelings of bloating in the abdomen.   Passage of more gas than usual.   Spotting of blood in your stool or on the toilet paper.  IF YOU HAD POLYPS REMOVED DURING THE COLONOSCOPY:  No aspirin products for 7 days or as instructed.   No alcohol for 7 days or as instructed.   Eat a soft diet for the next 24 hours.  FINDING OUT THE RESULTS OF YOUR TEST Not all test results are available during your visit. If your test results are not back during the visit, make an appointment  with your caregiver to find out the results. Do not assume everything is normal if you have not heard from your caregiver or the medical facility. It is important for you to follow up on all of your test results.  SEEK IMMEDIATE MEDICAL ATTENTION IF:  You have more than a spotting of blood in your stool.   Your belly is swollen (abdominal distention).   You are nauseated or vomiting.   You have a temperature over 101.   You have abdominal pain or discomfort that is severe or gets worse throughout the day.    Colon polyps and diverticulosis information provided  Further recommendations to follow pending review of pathology report    Colon Polyps Introduction Polyps are tissue growths inside the body. Polyps can grow in many places, including the large intestine (colon). A polyp may be a round bump or a mushroom-shaped growth. You could have one polyp or several. Most colon polyps are noncancerous (benign). However, some colon polyps can become cancerous over time. What are the causes? The exact cause of colon polyps is not known. What increases the risk? This condition is more likely to develop in people who:  Have a family history of colon cancer or colon polyps.  Are older than 69 or older than 45 if they are African American.  Have inflammatory bowel disease, such as ulcerative colitis or Crohn disease.  Are overweight.  Smoke cigarettes.  Do not get enough exercise.  Drink too much alcohol.  Eat a diet that is:  High in fat and red meat.  Low in fiber.  Had childhood cancer that was treated with abdominal radiation. What are the signs or symptoms? Most polyps do not cause symptoms. If you have symptoms, they may include:  Blood coming from your rectum when having a bowel movement.  Blood in your stool.The stool may look dark red or black.  A change in bowel habits, such as constipation or diarrhea. How is this diagnosed? This condition is diagnosed with  a colonoscopy. This is a procedure that uses a lighted, flexible scope to look at the inside of your colon. How is this treated? Treatment for this condition involves removing any polyps that are found. Those polyps will then be tested for cancer. If cancer is found, your health care provider will talk to you about options for colon cancer treatment. Follow these instructions at home: Diet  Eat plenty of fiber, such as fruits, vegetables, and whole grains.  Eat foods that are high in calcium and vitamin D, such as milk, cheese, yogurt, eggs, liver, fish, and broccoli.  Limit foods high in fat, red meats, and processed meats, such as hot dogs, sausage, bacon, and lunch meats.  Maintain a healthy weight, or lose weight if recommended by your health care provider. General instructions  Do not smoke cigarettes.  Do not drink alcohol excessively.  Keep all follow-up visits as told by your health care provider. This is important. This includes keeping regularly scheduled colonoscopies. Talk to your health care provider about when you need a colonoscopy.  Exercise every day or as told by your health care provider. Contact a health care provider if:  You have new or worsening bleeding during a bowel movement.  You have new or increased blood in your stool.  You have a change in bowel habits.  You unexpectedly lose weight. This information is not intended to replace advice given to you by your health care provider. Make sure you discuss any questions you have with your health care provider. Document Released: 12/04/2003 Document Revised: 08/15/2015 Document Reviewed: 01/28/2015  2017 Elsevier    Diverticulosis Diverticulosis is the condition that develops when small pouches (diverticula) form in the wall of your colon. Your colon, or large intestine, is where water is absorbed and stool is formed. The pouches form when the inside layer of your colon pushes through weak spots in the  outer layers of your colon. CAUSES  No one knows exactly what causes diverticulosis. RISK FACTORS  Being older than 53. Your risk for this condition increases with age. Diverticulosis is rare in people younger than 40 years. By age 75, almost everyone has it.  Eating a low-fiber diet.  Being frequently constipated.  Being overweight.  Not getting enough exercise.  Smoking.  Taking over-the-counter pain medicines, like aspirin and ibuprofen. SYMPTOMS  Most people with diverticulosis do not have symptoms. DIAGNOSIS  Because diverticulosis often has no symptoms, health care providers often discover the condition during an exam for other colon problems. In many cases, a health care provider will diagnose diverticulosis while using a flexible scope to examine the colon (colonoscopy). TREATMENT  If you have never developed an infection related to diverticulosis, you may not need treatment. If you have had an infection before, treatment may include:  Eating more fruits, vegetables, and grains.  Taking a fiber supplement.  Taking a live bacteria supplement (probiotic).  Taking medicine to relax your colon. HOME CARE  INSTRUCTIONS   Drink at least 6-8 glasses of water each day to prevent constipation.  Try not to strain when you have a bowel movement.  Keep all follow-up appointments. If you have had an infection before:  Increase the fiber in your diet as directed by your health care provider or dietitian.  Take a dietary fiber supplement if your health care provider approves.  Only take medicines as directed by your health care provider. SEEK MEDICAL CARE IF:   You have abdominal pain.  You have bloating.  You have cramps.  You have not gone to the bathroom in 3 days. SEEK IMMEDIATE MEDICAL CARE IF:   Your pain gets worse.  Yourbloating becomes very bad.  You have a fever or chills, and your symptoms suddenly get worse.  You begin vomiting.  You have bowel  movements that are bloody or black. MAKE SURE YOU:  Understand these instructions.  Will watch your condition.  Will get help right away if you are not doing well or get worse. This information is not intended to replace advice given to you by your health care provider. Make sure you discuss any questions you have with your health care provider. Document Released: 12/05/2003 Document Revised: 03/14/2013 Document Reviewed: 02/01/2013 Elsevier Interactive Patient Education  2017 [employer].

## 2016-03-12 NOTE — H&P (Signed)
@LOGO @   Primary Care Physician:  Sallee Lange, MD Primary Gastroenterologist:  Dr. Gala Romney  Pre-Procedure History & Physical: HPI:  Shelley [name] is a 65 y.o. female is here for a screening colonoscopy. No prior colonoscopy. No family history of colon cancer. No bowel symptoms.  Past Medical History:  Diagnosis Date  . Diabetes ([place])   . Diabetes mellitus without complication ([place])   . End stage renal disease ([place])   . HTN (hypertension)   . Hyperlipidemia   . Hypertension     Past Surgical History:  Procedure Laterality Date  . ABDOMINAL HYSTERECTOMY     fibroids/complete hysterec 2000  . APPENDECTOMY    . CATARACT EXTRACTION Bilateral   . YAG LASER APPLICATION Left 74/11/4494   Procedure: YAG LASER APPLICATION;  Surgeon: Williams Che, MD;  Location: [facility];  Service: Ophthalmology;  Laterality: Left;    Prior to Admission medications   Medication Sig Start Date End Date Taking? Authorizing Provider  amLODipine (NORVASC) 10 MG tablet Take 10 mg by mouth daily.  08/28/15  Yes Historical Provider, MD  aspirin EC 81 MG tablet Take 81 mg by mouth daily.   Yes Historical Provider, MD  atorvastatin (LIPITOR) 40 MG tablet TAKE 1 TABLET (40 MG TOTAL) BY MOUTH DAILY. 05/17/15  Yes Kathyrn Drown, MD  calcitRIOL (ROCALTROL) 0.25 MCG capsule Take 0.25 mcg by mouth daily.   Yes Historical Provider, MD  calcium acetate (PHOSLO) 667 MG capsule Take 1,334 mg by mouth 3 (three) times daily with meals. 12/21/15  Yes Historical Provider, MD  cloNIDine (CATAPRES) 0.1 MG tablet TAKE 1 TABLET BY MOUTH TWICE A DAY 07/18/15  Yes Mikey Kirschner, MD  insulin aspart (NOVOLOG FLEXPEN) 100 UNIT/ML FlexPen Inject 5 Units as directed 3 (three) times daily with meals. 07/17/15  Yes Historical Provider, MD  LEVEMIR FLEXTOUCH 100 UNIT/ML Pen Inject 15 Units as directed at bedtime. 03/05/16  Yes Historical Provider, MD  Multiple Vitamin (MULTIVITAMIN WITH MINERALS) TABS tablet Take 1 tablet by mouth daily.   Yes  Historical Provider, MD  pantoprazole (PROTONIX) 40 MG tablet Take 40 mg by mouth daily as needed for nausea. 07/08/15  Yes Historical Provider, MD  polyethylene glycol-electrolytes (TRILYTE) 420 g solution Take 4,000 mLs by mouth as directed. 02/19/16  Yes Daneil Dolin, MD  senna-docusate (SENOKOT-S) 8.6-50 MG tablet Take 2 tablets by mouth 2 (two) times daily as needed for constipation. 09/14/15 09/13/16 Yes Historical Provider, MD  lidocaine-prilocaine (EMLA) cream Apply 1 application topically daily as needed (port access).  02/07/16   Historical Provider, MD  promethazine (PHENERGAN) 12.5 MG tablet Take 12.5 mg by mouth 3 (three) times daily as needed for nausea/vomiting. 08/15/15   Historical Provider, MD    Allergies as of 02/19/2016  . (No Known Allergies)    History reviewed. No pertinent family history.  Social History   Social History  . Marital status: Single    Spouse name: N/A  . Number of children: N/A  . Years of education: N/A   Occupational History  . Not on file.   Social History Main Topics  . Smoking status: Former Smoker    Packs/day: 1.00    Types: Cigarettes    Start date: 01/29/2001    Quit date: 01/29/2005  . Smokeless tobacco: Never Used  . Alcohol use No  . Drug use: No  . Sexual activity: Not on file   Other Topics Concern  . Not on file   Social History Narrative  .  No narrative on file    Review of Systems: See HPI, otherwise negative ROS  Physical Exam: BP (!) 148/64   Pulse 77   Temp 97.6 F (36.4 C) (Oral)   Resp (!) 21   Ht 5' 4.75" (1.645 m)   Wt 179 lb (81.2 kg)   SpO2 96%   BMI 30.02 kg/m  General:   Alert,  Well-developed, well-nourished, pleasant and cooperative in NAD Head:  Normocephalic and atraumatic. Eyes:  Sclera clear, no icterus.   Conjunctiva pink. Lungs:  Clear throughout to auscultation.   No wheezes, crackles, or rhonchi. No acute distress. Heart:  Regular rate and rhythm; no murmurs, clicks, rubs,  or  gallops. Abdomen:  Soft, nontender and nondistended. No masses, hepatosplenomegaly or hernias noted. Normal bowel sounds, without guarding, and without rebound.     Impression/Plan: [name] is now here to undergo a screening colonoscopy.  First ever average risk screening examination.  Risks, benefits, limitations, imponderables and alternatives regarding colonoscopy have been reviewed with the patient. Questions have been answered. All parties agreeable.     Notice:  This dictation was prepared with Dragon dictation along with smaller phrase technology. Any transcriptional errors that result from this process are unintentional and may not be corrected upon review.

## 2016-03-12 NOTE — Op Note (Signed)
[facility] Patient Name: Shelley [name] Procedure Date: 03/12/2016 10:16 AM MRN: [id number] Date of Birth: [birth year] Attending MD: Norvel Richards , MD CSN: [id number] Age: 65 Admit Type: Outpatient Procedure:                Colonoscopy with snare polypectomy Indications:              Screening for colorectal malignant neoplasm Providers:                Norvel Richards, MD, Janeece Riggers, RN, Sherlyn Lees, Technician Referring MD:              Medicines:                Midazolam 5 mg IV, Fentanyl 100 micrograms IV,                            Ondansetron 4 mg IV Complications:            No immediate complications. Estimated Blood Loss:     Estimated blood loss was minimal. Procedure:                Pre-Anesthesia Assessment:                           - Prior to the procedure, a History and Physical                            was performed, and patient medications and                            allergies were reviewed. The patient's tolerance of                            previous anesthesia was also reviewed. The risks                            and benefits of the procedure and the sedation                            options and risks were discussed with the patient.                            All questions were answered, and informed consent                            was obtained. Prior Anticoagulants: The patient has                            taken no previous anticoagulant or antiplatelet                            agents. ASA Grade Assessment: III - A patient with  severe systemic disease. After reviewing the risks                            and benefits, the patient was deemed in                            satisfactory condition to undergo the procedure.                           After obtaining informed consent, the colonoscope                            was passed under direct vision. Throughout the                       procedure, the patient's blood pressure, pulse, and                            oxygen saturations were monitored continuously. The                            EC-[id number] ([id number]) scope was introduced through                            the anus and advanced to the the cecum, identified                            by appendiceal orifice and ileocecal valve. The                            colonoscopy was performed without difficulty. The                            patient tolerated the procedure well. The quality                            of the bowel preparation was adequate. The                            ileocecal valve, appendiceal orifice, and rectum                            were photographed. The entire colon was well                            visualized. Scope In: 10:27:23 AM Scope Out: 10:52:12 AM Scope Withdrawal Time: 0 hours 10 minutes 36 seconds  Total Procedure Duration: 0 hours 24 minutes 49 seconds  Findings:      The perianal and digital rectal examinations were normal.      Scattered small and large-mouthed diverticula were found in the sigmoid       colon and descending colon. Redundant colon.      Two semi-pedunculated polyps were found in the hepatic flexure. The       polyps were 4 to 6 mm in size. These  polyps were removed with a cold       snare. Resection and retrieval were complete. Estimated blood loss was       minimal.      The exam was otherwise without abnormality on direct and retroflexion       views. Impression:               - Diverticulosis in the sigmoid colon and in the                            descending colon. Redundant colon                           - Two 4 to 6 mm polyps at the hepatic flexure,                            removed with a cold snare. Resected and retrieved.                           - The examination was otherwise normal on direct                            and retroflexion views. Moderate Sedation:       Moderate (conscious) sedation was administered by the endoscopy nurse       and supervised by the endoscopist. The following parameters were       monitored: oxygen saturation, heart rate, blood pressure, respiratory       rate, EKG, adequacy of pulmonary ventilation, and response to care.       Total physician intraservice time was 34 minutes. Recommendation:           - Patient has a contact number available for                            emergencies. The signs and symptoms of potential                            delayed complications were discussed with the                            patient. Return to normal activities tomorrow.                            Written discharge instructions were provided to the                            patient.                           - Resume previous diet.                           - Continue present medications.                           - Repeat colonoscopy date to be determined after  pending pathology results are reviewed for                            surveillance based on pathology results.                           - Return to GI office (date not yet determined). Procedure Code(s):        --- Professional ---                           [id number], Colonoscopy, flexible; with removal of                            tumor(s), polyp(s), or other lesion(s) by snare                            technique                           99152, Moderate sedation services provided by the                            same physician or other qualified health care                            professional performing the diagnostic or                            therapeutic service that the sedation supports,                            requiring the presence of an independent trained                            observer to assist in the monitoring of the                            patient's level of consciousness and physiological                             status; initial 15 minutes of intraservice time,                            patient age 22 years or older                           [phone], Moderate sedation services; each additional                            15 minutes intraservice time Diagnosis Code(s):        --- Professional ---                           Z12.11, Encounter for screening for malignant  neoplasm of colon                           D12.3, Benign neoplasm of transverse colon (hepatic                            flexure or splenic flexure)                           K57.30, Diverticulosis of large intestine without                            perforation or abscess without bleeding CPT copyright 2016 American Medical Association. All rights reserved. The codes documented in this report are preliminary and upon coder review may  be revised to meet current compliance requirements. Cristopher Estimable. Cullin Dishman, MD Norvel Richards, MD 03/12/2016 11:01:00 AM This report has been signed electronically. Number of Addenda: 0

## 2016-03-14 ENCOUNTER — Encounter: Payer: Self-pay | Admitting: Internal Medicine

## 2016-03-17 ENCOUNTER — Encounter (HOSPITAL_COMMUNITY): Payer: Self-pay | Admitting: Internal Medicine

## 2016-03-19 ENCOUNTER — Other Ambulatory Visit: Payer: Self-pay | Admitting: Family Medicine

## 2016-03-20 NOTE — Telephone Encounter (Signed)
[facility] 03/20/16 mlm

## 2016-03-20 NOTE — Telephone Encounter (Signed)
Please call patient verify our they taking potassium. Do not refill unless patient states they are definitely taking. Verify dose. Then let me know what now you found out

## 2016-04-10 DIAGNOSIS — I871 Compression of vein: Secondary | ICD-10-CM | POA: Diagnosis not present

## 2016-04-10 DIAGNOSIS — T82868A Thrombosis of vascular prosthetic devices, implants and grafts, initial encounter: Secondary | ICD-10-CM | POA: Diagnosis not present

## 2016-04-10 DIAGNOSIS — N186 End stage renal disease: Secondary | ICD-10-CM | POA: Diagnosis not present

## 2016-04-10 DIAGNOSIS — Z992 Dependence on renal dialysis: Secondary | ICD-10-CM | POA: Diagnosis not present

## 2016-04-22 DIAGNOSIS — E1129 Type 2 diabetes mellitus with other diabetic kidney complication: Secondary | ICD-10-CM | POA: Diagnosis not present

## 2016-04-22 DIAGNOSIS — N186 End stage renal disease: Secondary | ICD-10-CM | POA: Diagnosis not present

## 2016-04-22 DIAGNOSIS — Z992 Dependence on renal dialysis: Secondary | ICD-10-CM | POA: Diagnosis not present

## 2016-05-05 DIAGNOSIS — Z7682 Awaiting organ transplant status: Secondary | ICD-10-CM | POA: Diagnosis not present

## 2016-05-19 DIAGNOSIS — Z524 Kidney donor: Secondary | ICD-10-CM | POA: Diagnosis not present

## 2016-05-19 DIAGNOSIS — Z005 Encounter for examination of potential donor of organ and tissue: Secondary | ICD-10-CM | POA: Diagnosis not present

## 2016-05-20 DIAGNOSIS — N186 End stage renal disease: Secondary | ICD-10-CM | POA: Diagnosis not present

## 2016-05-20 DIAGNOSIS — E1129 Type 2 diabetes mellitus with other diabetic kidney complication: Secondary | ICD-10-CM | POA: Diagnosis not present

## 2016-05-20 DIAGNOSIS — Z992 Dependence on renal dialysis: Secondary | ICD-10-CM | POA: Diagnosis not present

## 2016-06-01 DIAGNOSIS — Z7682 Awaiting organ transplant status: Secondary | ICD-10-CM | POA: Diagnosis not present

## 2016-07-11 ENCOUNTER — Other Ambulatory Visit: Payer: Self-pay | Admitting: Family Medicine

## 2016-07-13 DIAGNOSIS — Z005 Encounter for examination of potential donor of organ and tissue: Secondary | ICD-10-CM | POA: Diagnosis not present

## 2016-07-27 ENCOUNTER — Other Ambulatory Visit: Payer: Self-pay | Admitting: Family Medicine

## 2016-09-22 ENCOUNTER — Telehealth: Payer: Self-pay | Admitting: Gastroenterology

## 2016-09-22 ENCOUNTER — Ambulatory Visit: Payer: BLUE CROSS/BLUE SHIELD | Admitting: Gastroenterology

## 2016-09-22 ENCOUNTER — Encounter: Payer: Self-pay | Admitting: Gastroenterology

## 2016-09-22 DIAGNOSIS — Z01818 Encounter for other preprocedural examination: Secondary | ICD-10-CM | POA: Diagnosis not present

## 2016-09-22 DIAGNOSIS — Z992 Dependence on renal dialysis: Secondary | ICD-10-CM | POA: Diagnosis not present

## 2016-09-22 DIAGNOSIS — N186 End stage renal disease: Secondary | ICD-10-CM | POA: Diagnosis not present

## 2016-09-22 DIAGNOSIS — Z01810 Encounter for preprocedural cardiovascular examination: Secondary | ICD-10-CM | POA: Diagnosis not present

## 2016-09-22 NOTE — Telephone Encounter (Signed)
Pt was a no show and letter sent  °

## 2016-12-01 DIAGNOSIS — I313 Pericardial effusion (noninflammatory): Secondary | ICD-10-CM | POA: Diagnosis not present

## 2016-12-01 DIAGNOSIS — R079 Chest pain, unspecified: Secondary | ICD-10-CM | POA: Diagnosis not present

## 2016-12-01 DIAGNOSIS — I10 Essential (primary) hypertension: Secondary | ICD-10-CM | POA: Diagnosis not present

## 2016-12-01 DIAGNOSIS — Z992 Dependence on renal dialysis: Secondary | ICD-10-CM | POA: Diagnosis not present

## 2016-12-01 DIAGNOSIS — Z01810 Encounter for preprocedural cardiovascular examination: Secondary | ICD-10-CM | POA: Diagnosis not present

## 2016-12-01 DIAGNOSIS — N186 End stage renal disease: Secondary | ICD-10-CM | POA: Diagnosis not present

## 2016-12-01 DIAGNOSIS — E782 Mixed hyperlipidemia: Secondary | ICD-10-CM | POA: Diagnosis not present

## 2016-12-03 ENCOUNTER — Other Ambulatory Visit: Payer: Self-pay | Admitting: Family Medicine

## 2016-12-15 ENCOUNTER — Other Ambulatory Visit: Payer: Self-pay

## 2017-01-08 DIAGNOSIS — Z7682 Awaiting organ transplant status: Secondary | ICD-10-CM | POA: Diagnosis not present

## 2017-01-26 DIAGNOSIS — R079 Chest pain, unspecified: Secondary | ICD-10-CM | POA: Diagnosis not present

## 2017-02-15 ENCOUNTER — Other Ambulatory Visit: Payer: Self-pay

## 2017-02-15 ENCOUNTER — Telehealth: Payer: Self-pay | Admitting: *Deleted

## 2017-02-15 ENCOUNTER — Encounter (HOSPITAL_COMMUNITY): Payer: Self-pay | Admitting: Emergency Medicine

## 2017-02-15 ENCOUNTER — Emergency Department (HOSPITAL_COMMUNITY)
Admission: EM | Admit: 2017-02-15 | Discharge: 2017-02-15 | Disposition: A | Discharge status: Home / Self Care | Payer: BLUE CROSS/BLUE SHIELD | Attending: Emergency Medicine | Admitting: Emergency Medicine

## 2017-02-15 DIAGNOSIS — Y9389 Activity, other specified: Secondary | ICD-10-CM | POA: Diagnosis not present

## 2017-02-15 DIAGNOSIS — Y999 Unspecified external cause status: Secondary | ICD-10-CM | POA: Insufficient documentation

## 2017-02-15 DIAGNOSIS — Z7982 Long term (current) use of aspirin: Secondary | ICD-10-CM | POA: Insufficient documentation

## 2017-02-15 DIAGNOSIS — Z87891 Personal history of nicotine dependence: Secondary | ICD-10-CM | POA: Insufficient documentation

## 2017-02-15 DIAGNOSIS — Z794 Long term (current) use of insulin: Secondary | ICD-10-CM | POA: Diagnosis not present

## 2017-02-15 DIAGNOSIS — X58XXXA Exposure to other specified factors, initial encounter: Secondary | ICD-10-CM | POA: Diagnosis not present

## 2017-02-15 DIAGNOSIS — Z992 Dependence on renal dialysis: Secondary | ICD-10-CM | POA: Insufficient documentation

## 2017-02-15 DIAGNOSIS — S46912A Strain of unspecified muscle, fascia and tendon at shoulder and upper arm level, left arm, initial encounter: Secondary | ICD-10-CM | POA: Insufficient documentation

## 2017-02-15 DIAGNOSIS — E1122 Type 2 diabetes mellitus with diabetic chronic kidney disease: Secondary | ICD-10-CM | POA: Diagnosis not present

## 2017-02-15 DIAGNOSIS — N186 End stage renal disease: Secondary | ICD-10-CM | POA: Diagnosis not present

## 2017-02-15 DIAGNOSIS — I120 Hypertensive chronic kidney disease with stage 5 chronic kidney disease or end stage renal disease: Secondary | ICD-10-CM | POA: Diagnosis not present

## 2017-02-15 DIAGNOSIS — Y929 Unspecified place or not applicable: Secondary | ICD-10-CM | POA: Insufficient documentation

## 2017-02-15 DIAGNOSIS — S4992XA Unspecified injury of left shoulder and upper arm, initial encounter: Secondary | ICD-10-CM | POA: Diagnosis present

## 2017-02-15 DIAGNOSIS — Z79899 Other long term (current) drug therapy: Secondary | ICD-10-CM | POA: Insufficient documentation

## 2017-02-15 MED ORDER — TRAMADOL HCL 50 MG PO TABS: 50.0000 mg | ORAL_TABLET | Freq: Four times a day (QID) | ORAL | 0 refills | Status: DC | PRN

## 2017-02-15 NOTE — Discharge Instructions (Signed)
The pain in your left shoulder is likely related to a muscle injury of some type.  There are no signs for serious problems and including neck or chest disorders.  For the pain tried using heat on the sore area 3 or 4 times a day and gently moving the left shoulder.  We are prescribing a short-term prescription for tramadol which can help the pain.  Do not drive, when taking the tramadol.  See your doctor if not improving in 2 or 3 days.

## 2017-02-15 NOTE — ED Triage Notes (Signed)
PT c/o left shoulder pain with ROM x1 day. PT is on dialysis and had full treatment today.

## 2017-02-15 NOTE — Telephone Encounter (Signed)
Pt is at dialysis center now and complaining of left arm and shoulder pain. They had to put her on oxygen due to having sob.  Also blood glucoses have been high. 588 and 566 and she has been taking her insulin. Advised the nurse that called that pt should go to ED.

## 2017-02-15 NOTE — ED Provider Notes (Signed)
[facility] EMERGENCY DEPARTMENT Provider Note   CSN: [id number] Arrival date & time: 02/15/17  1125     History   Chief Complaint Chief Complaint  Patient presents with  . Shoulder Pain    HPI Shelley [name] is a 66 y.o. female.  She reports onset of left shoulder pain yesterday, without trauma.  She underwent her usual dialysis treatment today.  The pain in her left shoulder is worse when she moves her neck to the left or raises her arm.  She denies fever, cough, shortness of breath, dizziness or weakness.  No chronic neck pain, or prior similar problem.  There are no other known modifying factors.  HPI  Past Medical History:  Diagnosis Date  . Diabetes ([place])   . Diabetes mellitus without complication ([place])   . End stage renal disease ([place])   . HTN (hypertension)   . Hyperlipidemia   . Hypertension     Patient Active Problem List   Diagnosis Date Noted  . Chronic renal disease, stage V ([place]) 08/20/2015  . Personal history of noncompliance with medical treatment, presenting hazards to health 08/20/2015  . Proteinuria 07/04/2014  . Uncontrolled type 2 diabetes mellitus with ESRD (end-stage renal disease) ([place]) 05/21/2014  . Hyperlipidemia 05/21/2014  . HTN (hypertension) 05/21/2014    Past Surgical History:  Procedure Laterality Date  . ABDOMINAL HYSTERECTOMY     fibroids/complete hysterec 2000  . APPENDECTOMY    . CATARACT EXTRACTION Bilateral   . COLONOSCOPY N/A 03/12/2016   Procedure: COLONOSCOPY;  Surgeon: Daneil Dolin, MD;  Location: [facility] ENDO SUITE;  Service: Endoscopy;  Laterality: N/A;  9:30 am  . POLYPECTOMY  03/12/2016   Procedure: POLYPECTOMY;  Surgeon: Daneil Dolin, MD;  Location: [facility] ENDO SUITE;  Service: Endoscopy;;  colon  . YAG LASER APPLICATION Left 05/25/7423   Procedure: YAG LASER APPLICATION;  Surgeon: Williams Che, MD;  Location: [facility] ORS;  Service: Ophthalmology;  Laterality: Left;    OB History    Gravida Para Term Preterm AB Living             0   SAB TAB Ectopic Multiple Live Births                   Home Medications    Prior to Admission medications   Medication Sig Start Date End Date Taking? Authorizing Provider  amLODipine (NORVASC) 10 MG tablet Take 10 mg by mouth daily.  08/28/15   [provider]  aspirin EC 81 MG tablet Take 81 mg by mouth daily.    [provider]  atorvastatin (LIPITOR) 40 MG tablet TAKE 1 TABLET (40 MG TOTAL) BY MOUTH DAILY. 05/17/15   Kathyrn Drown, MD  calcitRIOL (ROCALTROL) 0.25 MCG capsule Take 0.25 mcg by mouth daily.    [provider]  calcium acetate (PHOSLO) 667 MG capsule Take 1,334 mg by mouth 3 (three) times daily with meals. 12/21/15   [provider]  cloNIDine (CATAPRES) 0.1 MG tablet TAKE 1 TABLET BY MOUTH TWICE A DAY 07/14/16   Mikey Kirschner, MD  insulin aspart (NOVOLOG FLEXPEN) 100 UNIT/ML FlexPen Inject 5 Units as directed 3 (three) times daily with meals. 07/17/15   [provider]  LEVEMIR FLEXTOUCH 100 UNIT/ML Pen INJECT 8 UNITS INTO THE SKIN AT BEDTIME. MAY TITRATE UP TO 30 UNITS (SPLIT BILL) 12/04/16   Kathyrn Drown, MD  lidocaine-prilocaine (EMLA) cream Apply 1 application topically daily as needed (port access).  02/07/16  [provider]  Multiple Vitamin (MULTIVITAMIN WITH MINERALS) TABS tablet Take 1 tablet by mouth daily.    [provider]  pantoprazole (PROTONIX) 40 MG tablet Take 40 mg by mouth daily as needed for nausea. 07/08/15   [provider]  pantoprazole (PROTONIX) 40 MG tablet TAKE 1 TABLET (40 MG TOTAL) BY MOUTH DAILY. 07/13/16   Kathyrn Drown, MD  polyethylene glycol-electrolytes (TRILYTE) 420 g solution Take 4,000 mLs by mouth as directed. 02/19/16   Rourk, Cristopher Estimable, MD  promethazine (PHENERGAN) 12.5 MG tablet Take 12.5 mg by mouth 3 (three) times daily as needed for nausea/vomiting. 08/15/15   [provider]  traMADol (ULTRAM) 50 MG tablet Take 1 tablet (50 mg  total) by mouth every 6 (six) hours as needed. 02/15/17   Daleen Bo, MD    Family History History reviewed. No pertinent family history.  Social History Social History   Tobacco Use  . Smoking status: Former Smoker    Packs/day: 1.00    Types: Cigarettes    Start date: 01/29/2001    Last attempt to quit: 01/29/2005    Years since quitting: 12.0  . Smokeless tobacco: Never Used  Substance Use Topics  . Alcohol use: No  . Drug use: No     Allergies   Patient has no known allergies.   Review of Systems Review of Systems  All other systems reviewed and are negative.    Physical Exam Updated Vital Signs BP (!) 180/90   Pulse 93   Temp 97.6 F (36.4 C) (Oral)   Resp 20   Ht 5\' 6"  (1.676 m)   Wt 82.1 kg (181 lb)   SpO2 98%   BMI 29.21 kg/m   Physical Exam  Constitutional: She is oriented to person, place, and time. She appears well-developed and well-nourished. No distress.  HENT:  Head: Normocephalic and atraumatic.  Eyes: Conjunctivae and EOM are normal. Pupils are equal, round, and reactive to light.  Neck: Normal range of motion and phonation normal. Neck supple.  Cardiovascular: Normal rate.  No thrill fistula left upper arm.  Normal radial pulse.  Normal perfusion/capillary refill fingers left hand.  Pulmonary/Chest: Effort normal.  Musculoskeletal: Normal range of motion.  Left shoulder tender anterosuperior, diffusely, with reproducible pain on flexion of the left shoulder with left arm rotated inward.  No weakness of the left shoulder.  No deformity of the left shoulder.  Neurological: She is alert and oriented to person, place, and time. She exhibits normal muscle tone.  Skin: Skin is warm and dry.  Psychiatric: She has a normal mood and affect. Her behavior is normal. Judgment and thought content normal.  Nursing note and vitals reviewed.    ED Treatments / Results  Labs (all labs ordered are listed, but only abnormal results are  displayed) Labs Reviewed - No data to display  EKG  EKG Interpretation None       Radiology No results found.  Procedures Procedures (including critical care time)  Medications Ordered in ED Medications - No data to display   Initial Impression / Assessment and Plan / ED Course  I have reviewed the triage vital signs and the nursing notes.  Pertinent labs & imaging results that were available during my care of the patient were reviewed by me and considered in my medical decision making (see chart for details).      Patient Vitals for the past 24 hrs:  BP Temp Temp src Pulse Resp SpO2 Height Weight  02/15/17 1511 (!) 180/90 - - 93 20 98 % - -  02/15/17 1147 (!) 154/92 97.6 F (36.4 C) Oral (!) 104 18 98 % - -  02/15/17 1144 - - - - - - 5\' 6"  (1.676 m) 82.1 kg (181 lb)    3:43 PM Reevaluation with update and discussion. After initial assessment and treatment, an updated evaluation reveals no change in clinical status.  Findings discussed with the patient, and all questions were answered. Daleen Bo      Final Clinical Impressions(s) / ED Diagnoses   Final diagnoses:  Strain of left shoulder, initial encounter   Acute left shoulder pain without trauma.  Pain is reproducible with examination palpation, and with movement.  Doubt fracture, nerve impingement, cervical myelopathy or thoracic source of the pain.  Suspect muscle strain.  Nursing Notes Reviewed/ Care Coordinated Applicable Imaging Reviewed Interpretation of Laboratory Data incorporated into ED treatment  The patient appears reasonably screened and/or stabilized for discharge and I doubt any other medical condition or other [facility] requiring further screening, evaluation, or treatment in the ED at this time prior to discharge.  Plan: Home Medications-continue usual medications; Home Treatments-heat to affected area; return here if the recommended treatment, does not improve the symptoms; Recommended follow  up-PCP, if not better in 2 or 3 days.    ED Discharge Orders        Ordered    traMADol (ULTRAM) 50 MG tablet  Every 6 hours PRN     02/15/17 1544       Daleen Bo, MD 02/15/17 1546

## 2017-02-15 NOTE — Telephone Encounter (Signed)
I agree

## 2017-02-18 ENCOUNTER — Other Ambulatory Visit: Payer: Self-pay

## 2017-02-18 ENCOUNTER — Telehealth: Payer: Self-pay | Admitting: Family Medicine

## 2017-02-18 MED ORDER — INSULIN DETEMIR 100 UNIT/ML FLEXPEN: PEN_INJECTOR | SUBCUTANEOUS | 0 refills | Status: DC

## 2017-02-18 NOTE — Telephone Encounter (Signed)
Pt is needing refills on  LEVEMIR FLEXTOUCH 100 UNIT/ML Pen Pt is out as of today.    [facility]

## 2017-02-18 NOTE — Telephone Encounter (Signed)
Spoke with patient and informed her that refills were sent into the pharmacy for [place]. Patient verbalized understanding.

## 2017-02-19 ENCOUNTER — Other Ambulatory Visit (HOSPITAL_COMMUNITY): Payer: Self-pay | Admitting: Nephrology

## 2017-02-21 DIAGNOSIS — T82898A Other specified complication of vascular prosthetic devices, implants and grafts, initial encounter: Secondary | ICD-10-CM | POA: Diagnosis not present

## 2017-02-21 DIAGNOSIS — I10 Essential (primary) hypertension: Secondary | ICD-10-CM | POA: Diagnosis not present

## 2017-02-21 DIAGNOSIS — E118 Type 2 diabetes mellitus with unspecified complications: Secondary | ICD-10-CM | POA: Diagnosis not present

## 2017-02-21 DIAGNOSIS — N186 End stage renal disease: Secondary | ICD-10-CM | POA: Diagnosis not present

## 2017-02-21 DIAGNOSIS — Z992 Dependence on renal dialysis: Secondary | ICD-10-CM | POA: Diagnosis not present

## 2017-02-21 DIAGNOSIS — M79602 Pain in left arm: Secondary | ICD-10-CM | POA: Diagnosis not present

## 2017-02-21 DIAGNOSIS — E782 Mixed hyperlipidemia: Secondary | ICD-10-CM | POA: Diagnosis not present

## 2017-02-21 DIAGNOSIS — T82590A Other mechanical complication of surgically created arteriovenous fistula, initial encounter: Secondary | ICD-10-CM | POA: Diagnosis not present

## 2017-02-21 DIAGNOSIS — E1165 Type 2 diabetes mellitus with hyperglycemia: Secondary | ICD-10-CM | POA: Diagnosis not present

## 2017-02-22 ENCOUNTER — Ambulatory Visit: Payer: Medicare Other | Admitting: Family Medicine

## 2017-02-23 DIAGNOSIS — E782 Mixed hyperlipidemia: Secondary | ICD-10-CM | POA: Diagnosis not present

## 2017-02-23 DIAGNOSIS — N186 End stage renal disease: Secondary | ICD-10-CM | POA: Diagnosis not present

## 2017-02-23 DIAGNOSIS — M79602 Pain in left arm: Secondary | ICD-10-CM | POA: Diagnosis not present

## 2017-02-23 DIAGNOSIS — E118 Type 2 diabetes mellitus with unspecified complications: Secondary | ICD-10-CM | POA: Diagnosis not present

## 2017-02-23 DIAGNOSIS — Z992 Dependence on renal dialysis: Secondary | ICD-10-CM | POA: Diagnosis not present

## 2017-02-23 DIAGNOSIS — T82898A Other specified complication of vascular prosthetic devices, implants and grafts, initial encounter: Secondary | ICD-10-CM | POA: Diagnosis not present

## 2017-02-23 DIAGNOSIS — E1165 Type 2 diabetes mellitus with hyperglycemia: Secondary | ICD-10-CM | POA: Diagnosis not present

## 2017-02-23 DIAGNOSIS — I10 Essential (primary) hypertension: Secondary | ICD-10-CM | POA: Diagnosis not present

## 2017-02-23 DIAGNOSIS — T82590A Other mechanical complication of surgically created arteriovenous fistula, initial encounter: Secondary | ICD-10-CM | POA: Diagnosis not present

## 2017-02-23 MED ORDER — ASPIRIN EC 81 MG PO TBEC
81.00 | DELAYED_RELEASE_TABLET | ORAL | Status: DC
Start: 2017-02-24 — End: 2017-02-23

## 2017-02-23 MED ORDER — ACETAMINOPHEN 325 MG PO TABS
975.00 | ORAL_TABLET | ORAL | Status: DC
Start: 2017-02-23 — End: 2017-02-23

## 2017-02-23 MED ORDER — CLONIDINE HCL 0.2 MG PO TABS
0.20 | ORAL_TABLET | ORAL | Status: DC
Start: 2017-02-23 — End: 2017-02-23

## 2017-02-23 MED ORDER — POLYETHYLENE GLYCOL 3350 17 G PO PACK
17.00 | PACK | ORAL | Status: DC
Start: ? — End: 2017-02-23

## 2017-02-23 MED ORDER — LIDOCAINE HCL 1 % IJ SOLN
.50 | INTRAMUSCULAR | Status: DC
Start: ? — End: 2017-02-23

## 2017-02-23 MED ORDER — SEVELAMER CARBONATE 800 MG PO TABS
800.00 | ORAL_TABLET | ORAL | Status: DC
Start: 2017-02-23 — End: 2017-02-23

## 2017-02-23 MED ORDER — DEXTROSE 50 % IV SOLN
12.50 | INTRAVENOUS | Status: DC
Start: ? — End: 2017-02-23

## 2017-02-23 MED ORDER — SENNOSIDES-DOCUSATE SODIUM 8.6-50 MG PO TABS
2.00 | ORAL_TABLET | ORAL | Status: DC
Start: 2017-02-24 — End: 2017-02-23

## 2017-02-23 MED ORDER — GENERIC EXTERNAL MEDICATION
Status: DC
Start: ? — End: 2017-02-23

## 2017-02-23 MED ORDER — AMLODIPINE BESYLATE 5 MG PO TABS
5.00 | ORAL_TABLET | ORAL | Status: DC
Start: 2017-02-23 — End: 2017-02-23

## 2017-02-23 MED ORDER — ATORVASTATIN CALCIUM 40 MG PO TABS
40.00 | ORAL_TABLET | ORAL | Status: DC
Start: 2017-02-23 — End: 2017-02-23

## 2017-02-23 MED ORDER — HYDROMORPHONE HCL 2 MG PO TABS
2.00 | ORAL_TABLET | ORAL | Status: DC
Start: ? — End: 2017-02-23

## 2017-02-23 MED ORDER — INSULIN GLARGINE 100 UNIT/ML ~~LOC~~ SOLN
15.00 | SUBCUTANEOUS | Status: DC
Start: 2017-02-23 — End: 2017-02-23

## 2017-02-23 MED ORDER — GLUCAGON HCL RDNA (DIAGNOSTIC) 1 MG IJ SOLR
1.00 | INTRAMUSCULAR | Status: DC
Start: ? — End: 2017-02-23

## 2017-02-23 MED ORDER — INSULIN REGULAR HUMAN 100 UNIT/ML IJ SOLN
.00 | INTRAMUSCULAR | Status: DC
Start: 2017-02-23 — End: 2017-02-23

## 2017-02-26 ENCOUNTER — Ambulatory Visit (INDEPENDENT_AMBULATORY_CARE_PROVIDER_SITE_OTHER): Payer: Medicare Other | Admitting: Family Medicine

## 2017-02-26 ENCOUNTER — Encounter: Payer: Self-pay | Admitting: Family Medicine

## 2017-02-26 VITALS — BP 158/72 | Temp 98.5°F | Ht 66.0 in | Wt 178.0 lb

## 2017-02-26 DIAGNOSIS — E1165 Type 2 diabetes mellitus with hyperglycemia: Secondary | ICD-10-CM

## 2017-02-26 DIAGNOSIS — E1122 Type 2 diabetes mellitus with diabetic chronic kidney disease: Secondary | ICD-10-CM | POA: Diagnosis not present

## 2017-02-26 DIAGNOSIS — N186 End stage renal disease: Secondary | ICD-10-CM

## 2017-02-26 DIAGNOSIS — IMO0002 Reserved for concepts with insufficient information to code with codable children: Secondary | ICD-10-CM

## 2017-02-26 LAB — POCT GLYCOSYLATED HEMOGLOBIN (HGB A1C): Hemoglobin A1C: 12

## 2017-02-26 LAB — POCT GLUCOSE (DEVICE FOR HOME USE): POC Glucose: 331 mg/dl — AB (ref 70–99)

## 2017-02-26 MED ORDER — HYDROCODONE-ACETAMINOPHEN 5-325 MG PO TABS: 1.0000 | ORAL_TABLET | Freq: Four times a day (QID) | ORAL | 0 refills | Status: DC | PRN

## 2017-02-26 NOTE — Progress Notes (Signed)
   Subjective:    Patient ID: Shelley [name], female    DOB: 21-Feb-1951, 66 y.o.   MRN: [id number]  [facility] malfunction.  Patient having some intermittent pain discomfort in her upper arm where she has had an dysfunction of a shunt.  She states that they did the procedure on it it seemed to help but then she started having some arm pain.  She states she is scheduled to see vascular surgery next week she also relates that her sugars have been running somewhat high but she has been taking her insulin but she is has not seen endocrinologist recently. Blood sugar running around 250.   Results for orders placed or performed in visit on 02/26/17  POCT glycosylated hemoglobin (Hb A1C)  Result Value Ref Range   Hemoglobin A1C 12.0   POCT Glucose (Device for Home Use)  Result Value Ref Range   Glucose Fasting, POC  70 - 99 mg/dL   POC Glucose 331 (A) 70 - 99 mg/dl       Review of Systems She denies excessive thirst urination denies pain down the arm other than up where the fistula is she denies pain or discomfort into the arm denies numbness tingling    Objective:   Physical Exam On physical exam I do not see any any sign of infection of this area there is no redness no tenderness just subjective discomfort and pain in that region her capillary refill is good on both hands she does have a radial pulse although not necessarily strong her skin is warm and dry in forearm and hand are not swollen       Assessment & Plan:  Diabetes I discussed with her how to adjust medications they are also discussed with her the importance of getting back in with endocrinology we will make the referral  The patient says she has an appointment with vascular and vein this coming Wednesday I encouraged her to keep the appointment I encouraged her to call there or call [facility] to see if they can see her sooner for this I did give her a prescription of hydrocodone I also encouraged the  patient that if her pain gets worse to go to the ER for vascular studies I do not find any evidence of occlusion of the arteries into the arm I do not find evidence of any cyanosis distally

## 2017-03-03 DIAGNOSIS — I871 Compression of vein: Secondary | ICD-10-CM | POA: Diagnosis not present

## 2017-03-03 DIAGNOSIS — T82858A Stenosis of vascular prosthetic devices, implants and grafts, initial encounter: Secondary | ICD-10-CM | POA: Diagnosis not present

## 2017-03-03 DIAGNOSIS — N186 End stage renal disease: Secondary | ICD-10-CM | POA: Diagnosis not present

## 2017-03-03 DIAGNOSIS — Z992 Dependence on renal dialysis: Secondary | ICD-10-CM | POA: Diagnosis not present

## 2017-03-18 ENCOUNTER — Ambulatory Visit (INDEPENDENT_AMBULATORY_CARE_PROVIDER_SITE_OTHER): Payer: BLUE CROSS/BLUE SHIELD | Admitting: Family Medicine

## 2017-03-18 ENCOUNTER — Encounter: Payer: Self-pay | Admitting: Family Medicine

## 2017-03-18 VITALS — BP 128/82 | Ht 66.0 in | Wt 184.0 lb

## 2017-03-18 DIAGNOSIS — E785 Hyperlipidemia, unspecified: Secondary | ICD-10-CM

## 2017-03-18 DIAGNOSIS — N186 End stage renal disease: Secondary | ICD-10-CM | POA: Diagnosis not present

## 2017-03-18 DIAGNOSIS — Z23 Encounter for immunization: Secondary | ICD-10-CM | POA: Diagnosis not present

## 2017-03-18 DIAGNOSIS — Z0000 Encounter for general adult medical examination without abnormal findings: Secondary | ICD-10-CM

## 2017-03-18 DIAGNOSIS — Z1231 Encounter for screening mammogram for malignant neoplasm of breast: Secondary | ICD-10-CM

## 2017-03-18 DIAGNOSIS — E1122 Type 2 diabetes mellitus with diabetic chronic kidney disease: Secondary | ICD-10-CM | POA: Diagnosis not present

## 2017-03-18 DIAGNOSIS — E1165 Type 2 diabetes mellitus with hyperglycemia: Secondary | ICD-10-CM | POA: Diagnosis not present

## 2017-03-18 DIAGNOSIS — E1169 Type 2 diabetes mellitus with other specified complication: Secondary | ICD-10-CM

## 2017-03-18 DIAGNOSIS — IMO0002 Reserved for concepts with insufficient information to code with codable children: Secondary | ICD-10-CM

## 2017-03-18 DIAGNOSIS — I10 Essential (primary) hypertension: Secondary | ICD-10-CM | POA: Diagnosis not present

## 2017-03-18 NOTE — Progress Notes (Signed)
Subjective:    Patient ID: Shelley [name], female    DOB: April 18, 1950, 66 y.o.   MRN: [id number]  HPI  AWV- Annual Wellness Visit  The patient was seen for their annual wellness visit. The patient's past medical history, surgical history, and family history were reviewed. Pertinent vaccines were reviewed ( tetanus, pneumonia, shingles, flu) The patient's medication list was reviewed and updated.  The height and weight were entered. The patient's current BMI is:29.71  Cognitive screening was completed. Outcome of Mini - Cog: pass  Falls within the past 6 months:yes 2  Current tobacco usage: none (All patients who use tobacco were given written and verbal information on quitting)  Recent listing of emergency department/hospitalizations over the past year were reviewed.  current specialist the patient sees on a regular basis: on dialysis- sees nephrologist   Medicare annual wellness visit patient questionnaire was reviewed.  A written screening schedule for the patient for the next 5-10 years was given. Appropriate discussion of followup regarding next visit was discussed.   Patient with c/o ongoing left shoulder pain s/p surgery for blood clot in arm   Review of Systems  Constitutional: Negative for activity change, appetite change and fatigue.  HENT: Negative for congestion and rhinorrhea.   Eyes: Negative for discharge.  Respiratory: Negative for cough, chest tightness and wheezing.   Cardiovascular: Negative for chest pain.  Gastrointestinal: Negative for abdominal pain, blood in stool and vomiting.  Endocrine: Negative for polyphagia.  Genitourinary: Negative for difficulty urinating and frequency.  Musculoskeletal: Negative for neck pain.  Skin: Negative for color change.  Allergic/Immunologic: Negative for environmental allergies and food allergies.  Neurological: Negative for weakness and headaches.  Psychiatric/Behavioral: Negative for agitation and behavioral  problems.       Objective:   Physical Exam  Constitutional: She is oriented to person, place, and time. She appears well-developed and well-nourished.  HENT:  Head: Normocephalic and atraumatic.  Right Ear: External ear normal.  Left Ear: External ear normal.  Eyes: Right eye exhibits no discharge. Left eye exhibits no discharge.  Neck: Normal range of motion. No tracheal deviation present.  Cardiovascular: Normal rate, regular rhythm, normal heart sounds and intact distal pulses. Exam reveals no gallop.  No murmur heard. Pulmonary/Chest: Effort normal and breath sounds normal. No stridor. No respiratory distress. She has no wheezes. She has no rales.  Abdominal: Soft. Bowel sounds are normal. She exhibits no distension and no mass. There is no tenderness. There is no rebound and no guarding.  Musculoskeletal: Normal range of motion. She exhibits no edema or tenderness.  Lymphadenopathy:    She has no cervical adenopathy.  Neurological: She is alert and oriented to person, place, and time. She exhibits normal muscle tone.  Skin: Skin is warm and dry.  Psychiatric: She has a normal mood and affect. Her behavior is normal.          Assessment & Plan:  Adult wellness-complete.wellness physical was conducted today. Importance of diet and exercise were discussed in detail. In addition to this a discussion regarding safety was also covered. We also reviewed over immunizations and gave recommendations regarding current immunization needed for age. In addition to this additional areas were also touched on including: Preventative health exams needed: Colonoscopy 2022  Patient was advised yearly wellness exam Pelvic exam was deferred Breast exam normal bilateral Mammogram ordered  Patient was just seen for her blood pressure and diabetes just a couple weeks ago she would do her labs again in March  with follow-up within 4-6 months

## 2017-03-22 DIAGNOSIS — N186 End stage renal disease: Secondary | ICD-10-CM | POA: Diagnosis not present

## 2017-03-22 DIAGNOSIS — E1129 Type 2 diabetes mellitus with other diabetic kidney complication: Secondary | ICD-10-CM | POA: Diagnosis not present

## 2017-03-22 DIAGNOSIS — Z992 Dependence on renal dialysis: Secondary | ICD-10-CM | POA: Diagnosis not present

## 2017-03-31 ENCOUNTER — Ambulatory Visit (HOSPITAL_COMMUNITY)
Admission: RE | Admit: 2017-03-31 | Discharge: 2017-03-31 | Disposition: A | Discharge status: Home / Self Care | Payer: BLUE CROSS/BLUE SHIELD | Source: Ambulatory Visit | Attending: Family Medicine | Admitting: Family Medicine

## 2017-03-31 ENCOUNTER — Ambulatory Visit (HOSPITAL_COMMUNITY): Payer: Medicare Other

## 2017-03-31 ENCOUNTER — Encounter (HOSPITAL_COMMUNITY): Payer: Self-pay

## 2017-03-31 ENCOUNTER — Ambulatory Visit (HOSPITAL_COMMUNITY): Payer: BLUE CROSS/BLUE SHIELD

## 2017-03-31 DIAGNOSIS — Z1231 Encounter for screening mammogram for malignant neoplasm of breast: Secondary | ICD-10-CM | POA: Diagnosis not present

## 2017-04-05 ENCOUNTER — Telehealth: Payer: Self-pay | Admitting: Family Medicine

## 2017-04-05 NOTE — Telephone Encounter (Signed)
I received insurance fax from [place]- requesting information regarding the patient.  Please connect with the patient.  In regards to disability it would be very important for her to get letters from her specialist sent to [facility] as well.  I will be able to do a letter for her but I need additional information.  Winded the patient last worked?  Is she working currently?  Has her other doctors taken her out on disability? (She does not come here often she has uncontrolled diabetes and renal failure for which she is seeing specialist plus also on dialysis nonetheless I will be happy to try to help her) please forward information back to me then we can forward information to [place] you-the form is being forwarded back to you

## 2017-04-21 DIAGNOSIS — Z029 Encounter for administrative examinations, unspecified: Secondary | ICD-10-CM

## 2017-04-22 DIAGNOSIS — E1129 Type 2 diabetes mellitus with other diabetic kidney complication: Secondary | ICD-10-CM | POA: Diagnosis not present

## 2017-04-22 DIAGNOSIS — N186 End stage renal disease: Secondary | ICD-10-CM | POA: Diagnosis not present

## 2017-04-22 DIAGNOSIS — Z992 Dependence on renal dialysis: Secondary | ICD-10-CM | POA: Diagnosis not present

## 2017-04-23 DIAGNOSIS — Z992 Dependence on renal dialysis: Secondary | ICD-10-CM | POA: Diagnosis not present

## 2017-04-23 DIAGNOSIS — E1129 Type 2 diabetes mellitus with other diabetic kidney complication: Secondary | ICD-10-CM | POA: Diagnosis not present

## 2017-04-23 DIAGNOSIS — N186 End stage renal disease: Secondary | ICD-10-CM | POA: Diagnosis not present

## 2017-05-18 ENCOUNTER — Telehealth: Payer: Self-pay | Admitting: Family Medicine

## 2017-05-18 MED ORDER — INSULIN DETEMIR 100 UNIT/ML FLEXPEN: PEN_INJECTOR | SUBCUTANEOUS | 5 refills | Status: DC

## 2017-05-18 NOTE — Telephone Encounter (Signed)
Pt aware medication sent in to [facility] as requested

## 2017-05-18 NOTE — Telephone Encounter (Signed)
Pt is needing a prescription for her insulin where it was increased the last time she was seen. Please advise.

## 2017-05-19 DIAGNOSIS — N186 End stage renal disease: Secondary | ICD-10-CM | POA: Diagnosis not present

## 2017-05-19 DIAGNOSIS — Z992 Dependence on renal dialysis: Secondary | ICD-10-CM | POA: Diagnosis not present

## 2017-05-19 DIAGNOSIS — T82858A Stenosis of vascular prosthetic devices, implants and grafts, initial encounter: Secondary | ICD-10-CM | POA: Diagnosis not present

## 2017-05-19 DIAGNOSIS — I871 Compression of vein: Secondary | ICD-10-CM | POA: Diagnosis not present

## 2017-06-17 ENCOUNTER — Other Ambulatory Visit: Payer: Self-pay | Admitting: Family Medicine

## 2017-06-17 DIAGNOSIS — E1122 Type 2 diabetes mellitus with diabetic chronic kidney disease: Secondary | ICD-10-CM | POA: Diagnosis not present

## 2017-06-17 DIAGNOSIS — E785 Hyperlipidemia, unspecified: Secondary | ICD-10-CM | POA: Diagnosis not present

## 2017-06-17 DIAGNOSIS — E1165 Type 2 diabetes mellitus with hyperglycemia: Secondary | ICD-10-CM | POA: Diagnosis not present

## 2017-06-17 DIAGNOSIS — E1169 Type 2 diabetes mellitus with other specified complication: Secondary | ICD-10-CM | POA: Diagnosis not present

## 2017-06-17 DIAGNOSIS — N186 End stage renal disease: Secondary | ICD-10-CM | POA: Diagnosis not present

## 2017-06-17 DIAGNOSIS — I10 Essential (primary) hypertension: Secondary | ICD-10-CM | POA: Diagnosis not present

## 2017-06-17 LAB — HEPATIC FUNCTION PANEL
AG Ratio: 1.7 (calc) (ref 1.0–2.5)
ALT: 12 U/L (ref 6–29)
AST: 14 U/L (ref 10–35)
Albumin: 4.8 g/dL (ref 3.6–5.1)
Alkaline phosphatase (APISO): 60 U/L (ref 33–130)
Bilirubin, Direct: 0.1 mg/dL (ref 0.0–0.2)
Globulin: 2.8 g/dL (calc) (ref 1.9–3.7)
Indirect Bilirubin: 0.4 mg/dL (calc) (ref 0.2–1.2)
Total Bilirubin: 0.5 mg/dL (ref 0.2–1.2)
Total Protein: 7.6 g/dL (ref 6.1–8.1)

## 2017-06-17 LAB — LIPID PANEL
Cholesterol: 187 mg/dL (ref <200)
HDL: 59 mg/dL (ref >50)
LDL Cholesterol (Calc): 104 mg/dL (calc) — ABNORMAL HIGH
Non-HDL Cholesterol (Calc): 128 mg/dL (calc) (ref <130)
Total CHOL/HDL Ratio: 3.2 (calc) (ref <5.0)
Triglycerides: 144 mg/dL (ref <150)

## 2017-06-17 LAB — HEMOGLOBIN A1C
Hgb A1c MFr Bld: 8.7 % of total Hgb — ABNORMAL HIGH (ref <5.7)
Mean Plasma Glucose: 203 (calc)
eAG (mmol/L): 11.2 (calc)

## 2017-06-20 DIAGNOSIS — N186 End stage renal disease: Secondary | ICD-10-CM | POA: Diagnosis not present

## 2017-06-20 DIAGNOSIS — Z992 Dependence on renal dialysis: Secondary | ICD-10-CM | POA: Diagnosis not present

## 2017-06-20 DIAGNOSIS — E1129 Type 2 diabetes mellitus with other diabetic kidney complication: Secondary | ICD-10-CM | POA: Diagnosis not present

## 2017-06-24 ENCOUNTER — Other Ambulatory Visit: Payer: Self-pay | Admitting: Family Medicine

## 2017-06-24 ENCOUNTER — Telehealth: Payer: Self-pay | Admitting: Family Medicine

## 2017-06-24 DIAGNOSIS — E1122 Type 2 diabetes mellitus with diabetic chronic kidney disease: Secondary | ICD-10-CM

## 2017-06-24 DIAGNOSIS — IMO0002 Reserved for concepts with insufficient information to code with codable children: Secondary | ICD-10-CM

## 2017-06-24 DIAGNOSIS — E1165 Type 2 diabetes mellitus with hyperglycemia: Principal | ICD-10-CM

## 2017-06-24 DIAGNOSIS — N186 End stage renal disease: Principal | ICD-10-CM

## 2017-06-24 NOTE — Telephone Encounter (Signed)
Left message to return call. Results are in result notes. We left a message for her on 4/1 to call back also. See result notes

## 2017-06-24 NOTE — Telephone Encounter (Signed)
Patient would like lab results from labs done last week.

## 2017-06-25 NOTE — Telephone Encounter (Signed)
Spoke with patient verbalized understanding.

## 2017-06-28 ENCOUNTER — Encounter: Payer: Self-pay | Admitting: Family Medicine

## 2017-06-28 ENCOUNTER — Encounter (INDEPENDENT_AMBULATORY_CARE_PROVIDER_SITE_OTHER): Payer: Self-pay

## 2017-08-02 ENCOUNTER — Encounter: Payer: Self-pay | Admitting: Endocrinology

## 2017-08-12 DIAGNOSIS — Z1159 Encounter for screening for other viral diseases: Secondary | ICD-10-CM | POA: Diagnosis not present

## 2017-08-12 DIAGNOSIS — Z114 Encounter for screening for human immunodeficiency virus [HIV]: Secondary | ICD-10-CM | POA: Diagnosis not present

## 2017-08-12 DIAGNOSIS — N186 End stage renal disease: Secondary | ICD-10-CM | POA: Diagnosis not present

## 2017-08-12 DIAGNOSIS — E119 Type 2 diabetes mellitus without complications: Secondary | ICD-10-CM | POA: Diagnosis not present

## 2017-08-12 DIAGNOSIS — Z01818 Encounter for other preprocedural examination: Secondary | ICD-10-CM | POA: Diagnosis not present

## 2017-08-12 DIAGNOSIS — N179 Acute kidney failure, unspecified: Secondary | ICD-10-CM | POA: Diagnosis not present

## 2017-08-12 DIAGNOSIS — Z7682 Awaiting organ transplant status: Secondary | ICD-10-CM | POA: Diagnosis not present

## 2017-08-27 ENCOUNTER — Other Ambulatory Visit: Payer: Self-pay

## 2017-08-27 ENCOUNTER — Ambulatory Visit (INDEPENDENT_AMBULATORY_CARE_PROVIDER_SITE_OTHER): Payer: BLUE CROSS/BLUE SHIELD | Admitting: Endocrinology

## 2017-08-27 ENCOUNTER — Encounter: Payer: Self-pay | Admitting: Endocrinology

## 2017-08-27 VITALS — BP 150/80 | HR 88 | Ht 66.0 in | Wt 190.6 lb

## 2017-08-27 DIAGNOSIS — E1165 Type 2 diabetes mellitus with hyperglycemia: Secondary | ICD-10-CM

## 2017-08-27 DIAGNOSIS — N185 Chronic kidney disease, stage 5: Secondary | ICD-10-CM

## 2017-08-27 DIAGNOSIS — Z794 Long term (current) use of insulin: Secondary | ICD-10-CM | POA: Diagnosis not present

## 2017-08-27 DIAGNOSIS — E1142 Type 2 diabetes mellitus with diabetic polyneuropathy: Secondary | ICD-10-CM

## 2017-08-27 LAB — POCT GLYCOSYLATED HEMOGLOBIN (HGB A1C): Hemoglobin A1C: 7.7 % — AB (ref 4.0–5.6)

## 2017-08-27 MED ORDER — INSULIN DEGLUDEC 200 UNIT/ML ~~LOC~~ SOPN: 40.0000 [IU] | PEN_INJECTOR | Freq: Every day | SUBCUTANEOUS | 3 refills | Status: DC

## 2017-08-27 MED ORDER — "PEN NEEDLES 3/16"" 31G X 5 MM MISC": 1.0000 | Freq: Every day | 3 refills | Status: AC

## 2017-08-27 NOTE — Patient Instructions (Addendum)
Check blood sugars on waking up  5/7 daily and some before meals  Also check blood sugars about 2 hours after a meal and do this after different meals by rotation  Recommended blood sugar levels on waking up is 90-130 and about 2 hours after meal is 130-160  Please bring your blood sugar monitor to each visit, thank you  At least prior to the next visit check your sugar 4 times a day for documentation purposes  Levemir 20 units 2x daily at breakfast and suppertime  If the blood sugar when you wake up is consistently below 90 or going over 150 need to reduce or increase the doses by 2 units each  When finished  NOVOLOG: Take 10 units for low carb meals and 14 units when eating some carbohydrates before meals  For high blood sugars take extra NovoLog insulin as follows before meals  Blood sugar 150-199 =2 units 200-249 =4 units 250-300+ =6 units

## 2017-08-27 NOTE — Progress Notes (Signed)
Patient ID: Shelley [name], female   DOB: 11/17/1950, 67 y.o.   MRN: [id number]          Reason for Appointment: Consultation for Type 2 Diabetes  Referring physician:   History of Present Illness:          Date of diagnosis of type 2 diabetes mellitus: Age 77       Background history:   She had been on metformin for several years and she thinks she went on insulin only about 7 years ago No details available She has been mostly treated by a primary care physician and for short time by an endocrinologist  Recent history:   INSULIN regimen is: 32 Levemir at bedtime, NovoLog 10 units before meals       Non-insulin hypoglycemic drugs the patient is taking are: None  Current management, blood sugar patterns and problems identified:   Over the last few months her PCP has advised her to continue increasing her Levemir insulin, previously taking 15 units  She thinks her blood sugars in the mornings are recently usually fairly good but not clear how often she is monitoring  She does not like to do fingersticks and only occasionally checks readings at lunch or suppertime and these are usually 200 or more  Not checking after dinner  Tries to cut back on carbohydrates but not consistently since she thinks that with this change her blood sugars are better  Over the last few months appears to have gained weight  Currently not exercising because of weakness         Side effects from medications have been: None  Compliance with the medical regimen: Inconsistent Hypoglycemia: Very rare   Glucose monitoring:  done <1 times a day         Glucometer: One Touch.  Ultra 2 ?       Blood Glucose readings by time of day and averages from recall:  PREMEAL Breakfast Lunch Dinner Bedtime  Overall   Glucose range: 83-134 200 220    Median:        POST-MEAL PC Breakfast PC Lunch PC Dinner  Glucose range:   ?  Median:      Self-care: The diet that the patient has been following is: tries to  limit carbohydrates.    :   Typical meal intake: Breakfast is   usually eggs and applesauce              Dietician visit, most recent: Unknown, she periodically talks to the dietitian at dialysis             Exercise:  None  Weight history:  Wt Readings from Last 3 Encounters:  08/27/17 190 lb 9.6 oz (86.5 kg)  03/18/17 184 lb (83.5 kg)  02/26/17 178 lb (80.7 kg)    Glycemic control:   Lab Results  Component Value Date   HGBA1C 7.7 (A) 08/27/2017   HGBA1C 8.7 (H) 06/17/2017   HGBA1C 12.0 02/26/2017   Lab Results  Component Value Date   MICROALBUR 432.2 (H) 06/11/2014   LDLCALC 104 (H) 06/17/2017   CREATININE 6.21 (H) 08/14/2015   Lab Results  Component Value Date   MICRALBCREAT 4,973.5 (H) 06/11/2014    No results found for: FRUCTOSAMINE    Allergies as of 08/27/2017   No Known Allergies     Medication List        Accurate as of 08/27/17  2:47 PM. Always use your most recent med list.  amLODipine 10 MG tablet Commonly known as:  NORVASC Take 10 mg by mouth daily.   aspirin EC 81 MG tablet Take 81 mg by mouth daily.   atorvastatin 40 MG tablet Commonly known as:  LIPITOR TAKE 1 TABLET (40 MG TOTAL) BY MOUTH DAILY.   calcium acetate 667 MG capsule Commonly known as:  PHOSLO Take 1,334 mg by mouth 3 (three) times daily with meals.   cinacalcet 30 MG tablet Commonly known as:  SENSIPAR Take 30 mg by mouth daily with supper.   cloNIDine 0.1 MG tablet Commonly known as:  CATAPRES TAKE 1 TABLET BY MOUTH TWICE A DAY   Insulin Detemir 100 UNIT/ML Pen Commonly known as:  LEVEMIR FLEXTOUCH INJECT 8 UNITS INTO THE SKIN AT BEDTIME. MAY TITRATE UP TO 30 UNITS (SPLIT BILL)   multivitamin with minerals Tabs tablet Take 1 tablet by mouth daily.   NOVOLOG FLEXPEN 100 UNIT/ML FlexPen Generic drug:  insulin aspart Inject 5 Units as directed 3 (three) times daily with meals.       Allergies: No Known Allergies  Past Medical History:  Diagnosis  Date  . Diabetes ([place])   . Diabetes mellitus without complication ([place])   . End stage renal disease ([place])   . HTN (hypertension)   . Hyperlipidemia   . Hypertension     Past Surgical History:  Procedure Laterality Date  . ABDOMINAL HYSTERECTOMY     fibroids/complete hysterec 2000  . APPENDECTOMY    . CATARACT EXTRACTION Bilateral   . COLONOSCOPY N/A 03/12/2016   Procedure: COLONOSCOPY;  Surgeon: Daneil Dolin, MD;  Location: [facility];  Service: Endoscopy;  Laterality: N/A;  9:30 am  . POLYPECTOMY  03/12/2016   Procedure: POLYPECTOMY;  Surgeon: Daneil Dolin, MD;  Location: [facility];  Service: Endoscopy;;  colon  . YAG LASER APPLICATION Left 24/0/9735   Procedure: YAG LASER APPLICATION;  Surgeon: Williams Che, MD;  Location: [facility];  Service: Ophthalmology;  Laterality: Left;    History reviewed. No pertinent family history.  Social History:  reports that she quit smoking about 12 years ago. Her smoking use included cigarettes. She started smoking about 16 years ago. She smoked 1.00 pack per day. She has never used smokeless tobacco. She reports that she does not drink alcohol or use drugs.   Review of Systems  Constitutional: Positive for weight gain.  HENT: Negative for headaches.   Eyes: Negative for blurred vision.       Has history of cataract surgery, overdue for follow-up  Respiratory: Negative for shortness of breath.   Cardiovascular: Positive for chest pain. Negative for leg swelling.  Gastrointestinal: Negative for constipation and diarrhea.  Endocrine: Positive for fatigue.  Musculoskeletal: Negative for joint pain.       She has some stiffness in her feet and legs  Skin: Negative for itching.  Neurological: Positive for weakness and tingling.       Has periodic tingling or burning in her feet but no sharp pains and not on any medications for this     Lipid history:     Lab Results  Component Value Date   CHOL 187 06/17/2017   HDL 59  06/17/2017   LDLCALC 104 (H) 06/17/2017   TRIG 144 06/17/2017   CHOLHDL 3.2 06/17/2017           Hypertension:  BP Readings from Last 3 Encounters:  08/27/17 (!) 150/80  03/18/17 128/82  02/26/17 (!) 158/72    Most recent  eye exam was in 2014  Most recent foot exam:    LABS:  Office Visit on 08/27/2017  Component Date Value Ref Range Status  . Hemoglobin A1C 08/27/2017 7.7* 4.0 - 5.6 % Final    Physical Examination:  BP (!) 150/80 (BP Location: Left Arm, Patient Position: Sitting, Cuff Size: Normal)   Pulse 88   Ht 5\' 6"  (1.676 m)   Wt 190 lb 9.6 oz (86.5 kg)   SpO2 96%   BMI 30.76 kg/m   GENERAL:         Patient has generalized obesity.    HEENT:         Eye exam shows normal external appearance.  Fundus exam shows no retinopathy on the right, left fundus not clearly seen.  Oral exam shows normal mucosa .  NECK:   There is no lymphadenopathy Thyroid is not enlarged and no nodules felt.  Carotids are normal to palpation and no bruit heard  LUNGS:         Chest is symmetrical. Lungs are clear to auscultation.Marland Kitchen   HEART:         Heart sounds:  S1 and S2 are normal.  3-4/6 soft long ejection murmur on the base heard much better on sitting position without radiation No other murmur or click heard., no S3 or S4.    ABDOMEN:   There is no distention present. Liver and spleen are not palpable. No other mass or tenderness present.    NEUROLOGICAL:   Ankle jerks are absent bilaterally.   Biceps reflexes absent  Diabetic Foot Exam - Simple   Simple Foot Form Diabetic Foot exam was performed with the following findings:  Yes   Visual Inspection No deformities, no ulcerations, no other skin breakdown bilaterally:  Yes Sensation Testing Intact to touch and monofilament testing bilaterally:  Yes Pulse Check See comments:  Yes Comments Absent pedal pulses            Vibration sense is moderately reduced in distal first toes. MUSCULOSKELETAL:  There is no swelling  or deformity of the peripheral joints.     EXTREMITIES:     There is no edema.  SKIN:       No rash or lesions of concern.        ASSESSMENT:  Diabetes type 2, usually uncontrolled  See history of present illness for detailed discussion of current diabetes management, blood sugar patterns and problems identified  Her A1c is now 7.7% She is on basal bolus insulin regimen along with persistently poor control from inadequate insulin She appears to have much higher postprandial readings when she checks them which is infrequent Has difficulty complying with instructions for glucose monitoring at home with doing sporadic fasting readings only Her fasting readings are relatively good reportedly but unlikely that her Levemir is working 24 hours since blood sugars are higher later in the day and her A1c is still reflecting an average of 170+ She has a variable diet Not able to exercise currently Currently patient is wanting alternatives to fingerstick testing and also not wanting to do multiple injections for her management   Complications of diabetes: 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, mild neuropathy, known status of retinopathy  Hypertension managed by nephrologist  Hypercholesterolemia with last LDL over 100  PLAN:    Since she has reportedly higher readings later in the day and not fasting she will benefit from using Levemir twice a day for now  For now she can use  20 units twice a day  Discussed that she needs to check her blood sugars much more often to help her with insulin adjustment  If her morning sugars are consistently out of range she can go up or down 2 units on her Levemir twice a day  Once she is finished with her supply of Levemir she will change to [place] U-200 insulin, 40 units daily  Most likely she will need to take 14 units of NovoLog with meals since she reportedly has high readings postprandially, may reduce to 10 units for lower carbohydrate  meals  Since she may be on steroids after her renal transplant next week she will probably need much more insulin and will also need correction doses for high readings  Discussed that with carbohydrate counting she may be over to adjust her mealtime insulin better and will consider consultation with dietitian also  She may be interested in the freestyle [place] system for glucose monitoring and will need to document that she is checking 4 times a day for her to qualify for Medicare requirements  Also subsequently would be benefiting from considering an Omnipod insulin pump since she does not want to do multiple injections  Encouraged her to start walking regularly when she is able to  Given new One Touch Verio monitor and showed her how this is different, to also labile readings after meals  Patient Instructions  Check blood sugars on waking up  5/7 daily and some before meals  Also check blood sugars about 2 hours after a meal and do this after different meals by rotation  Recommended blood sugar levels on waking up is 90-130 and about 2 hours after meal is 130-160  Please bring your blood sugar monitor to each visit, thank you  At least prior to the next visit check your sugar 4 times a day for documentation purposes  Levemir 20 units 2x daily at breakfast and suppertime  If the blood sugar when you wake up is consistently below 90 or going over 150 need to reduce or increase the doses by 2 units each  When finished  NOVOLOG: Take 10 units for low carb meals and 14 units when eating some carbohydrates before meals  For high blood sugars take extra NovoLog insulin as follows before meals  Blood sugar 150-199 =2 units 200-249 =4 units 250-300+ =6 units   Counseling time on subjects discussed in assessment and plan sections is over 50% of today's 60 minute visit   Consultation note has been sent to the referring physician  Elayne Snare 08/27/2017, 2:47 PM   Note: This office  note was prepared with Dragon voice recognition system technology. Any transcriptional errors that result from this process are unintentional

## 2017-08-30 DIAGNOSIS — E782 Mixed hyperlipidemia: Secondary | ICD-10-CM | POA: Diagnosis present

## 2017-08-30 DIAGNOSIS — E1122 Type 2 diabetes mellitus with diabetic chronic kidney disease: Secondary | ICD-10-CM | POA: Diagnosis not present

## 2017-08-30 DIAGNOSIS — D62 Acute posthemorrhagic anemia: Secondary | ICD-10-CM | POA: Diagnosis not present

## 2017-08-30 DIAGNOSIS — E118 Type 2 diabetes mellitus with unspecified complications: Secondary | ICD-10-CM | POA: Diagnosis not present

## 2017-08-30 DIAGNOSIS — Z940 Kidney transplant status: Secondary | ICD-10-CM | POA: Diagnosis not present

## 2017-08-30 DIAGNOSIS — T380X5A Adverse effect of glucocorticoids and synthetic analogues, initial encounter: Secondary | ICD-10-CM | POA: Diagnosis not present

## 2017-08-30 DIAGNOSIS — N186 End stage renal disease: Secondary | ICD-10-CM | POA: Diagnosis not present

## 2017-08-30 DIAGNOSIS — R739 Hyperglycemia, unspecified: Secondary | ICD-10-CM | POA: Diagnosis not present

## 2017-08-30 DIAGNOSIS — I120 Hypertensive chronic kidney disease with stage 5 chronic kidney disease or end stage renal disease: Secondary | ICD-10-CM | POA: Diagnosis not present

## 2017-08-30 DIAGNOSIS — D899 Disorder involving the immune mechanism, unspecified: Secondary | ICD-10-CM | POA: Diagnosis not present

## 2017-08-30 DIAGNOSIS — Z794 Long term (current) use of insulin: Secondary | ICD-10-CM | POA: Diagnosis not present

## 2017-08-30 DIAGNOSIS — Z992 Dependence on renal dialysis: Secondary | ICD-10-CM | POA: Diagnosis not present

## 2017-08-30 DIAGNOSIS — Z90710 Acquired absence of both cervix and uterus: Secondary | ICD-10-CM | POA: Diagnosis not present

## 2017-08-30 DIAGNOSIS — Z524 Kidney donor: Secondary | ICD-10-CM | POA: Diagnosis not present

## 2017-08-30 DIAGNOSIS — N2581 Secondary hyperparathyroidism of renal origin: Secondary | ICD-10-CM | POA: Diagnosis present

## 2017-08-30 DIAGNOSIS — E1121 Type 2 diabetes mellitus with diabetic nephropathy: Secondary | ICD-10-CM | POA: Diagnosis present

## 2017-08-30 DIAGNOSIS — E11649 Type 2 diabetes mellitus with hypoglycemia without coma: Secondary | ICD-10-CM | POA: Diagnosis not present

## 2017-08-30 DIAGNOSIS — Z7982 Long term (current) use of aspirin: Secondary | ICD-10-CM | POA: Diagnosis not present

## 2017-08-30 DIAGNOSIS — E1165 Type 2 diabetes mellitus with hyperglycemia: Secondary | ICD-10-CM | POA: Diagnosis not present

## 2017-09-04 MED ORDER — CLONIDINE HCL 0.2 MG PO TABS
0.20 | ORAL_TABLET | ORAL | Status: DC
Start: 2017-09-04 — End: 2017-09-04

## 2017-09-04 MED ORDER — VALGANCICLOVIR HCL 450 MG PO TABS
900.00 | ORAL_TABLET | ORAL | Status: DC
Start: ? — End: 2017-09-04

## 2017-09-04 MED ORDER — SULFAMETHOXAZOLE-TRIMETHOPRIM 800-160 MG PO TABS
1.00 | ORAL_TABLET | ORAL | Status: DC
Start: ? — End: 2017-09-04

## 2017-09-04 MED ORDER — GENERIC EXTERNAL MEDICATION
1.00 | Status: DC
Start: ? — End: 2017-09-04

## 2017-09-04 MED ORDER — INSULIN LISPRO 100 UNIT/ML ~~LOC~~ SOLN
6.00 | SUBCUTANEOUS | Status: DC
Start: 2017-09-04 — End: 2017-09-04

## 2017-09-04 MED ORDER — TACROLIMUS 1 MG PO CAPS
4.00 | ORAL_CAPSULE | ORAL | Status: DC
Start: 2017-09-04 — End: 2017-09-04

## 2017-09-04 MED ORDER — OXYCODONE HCL 5 MG PO TABS
5.00 | ORAL_TABLET | ORAL | Status: DC
Start: ? — End: 2017-09-04

## 2017-09-04 MED ORDER — RANITIDINE HCL 150 MG PO TABS
150.00 | ORAL_TABLET | ORAL | Status: DC
Start: 2017-09-05 — End: 2017-09-04

## 2017-09-04 MED ORDER — AMLODIPINE BESYLATE 10 MG PO TABS
10.00 | ORAL_TABLET | ORAL | Status: DC
Start: 2017-09-05 — End: 2017-09-04

## 2017-09-04 MED ORDER — ACETAMINOPHEN 325 MG PO TABS
650.00 | ORAL_TABLET | ORAL | Status: DC
Start: 2017-09-04 — End: 2017-09-04

## 2017-09-04 MED ORDER — INSULIN GLARGINE 100 UNIT/ML ~~LOC~~ SOLN
10.00 | SUBCUTANEOUS | Status: DC
Start: 2017-09-04 — End: 2017-09-04

## 2017-09-04 MED ORDER — GENERIC EXTERNAL MEDICATION
30.00 | Status: DC
Start: 2017-09-05 — End: 2017-09-04

## 2017-09-04 MED ORDER — ATORVASTATIN CALCIUM 40 MG PO TABS
40.00 | ORAL_TABLET | ORAL | Status: DC
Start: 2017-09-04 — End: 2017-09-04

## 2017-09-04 MED ORDER — MYCOPHENOLATE MOFETIL 250 MG PO CAPS
1000.00 | ORAL_CAPSULE | ORAL | Status: DC
Start: 2017-09-04 — End: 2017-09-04

## 2017-09-04 MED ORDER — TACROLIMUS 5 MG PO CAPS
5.00 | ORAL_CAPSULE | ORAL | Status: DC
Start: 2017-09-05 — End: 2017-09-04

## 2017-09-04 MED ORDER — ASPIRIN 81 MG PO CHEW
81.00 | CHEWABLE_TABLET | ORAL | Status: DC
Start: 2017-09-05 — End: 2017-09-04

## 2017-09-04 MED ORDER — MELATONIN 3 MG PO TBDP
3.00 | ORAL_TABLET | ORAL | Status: DC
Start: 2017-09-04 — End: 2017-09-04

## 2017-09-04 MED ORDER — DEXTROSE 50 % IV SOLN
12.50 | INTRAVENOUS | Status: DC
Start: ? — End: 2017-09-04

## 2017-09-04 MED ORDER — INSULIN LISPRO 100 UNIT/ML ~~LOC~~ SOLN
6.00 | SUBCUTANEOUS | Status: DC
Start: 2017-09-05 — End: 2017-09-04

## 2017-09-04 MED ORDER — SENNOSIDES-DOCUSATE SODIUM 8.6-50 MG PO TABS
2.00 | ORAL_TABLET | ORAL | Status: DC
Start: 2017-09-04 — End: 2017-09-04

## 2017-09-04 MED ORDER — INSULIN LISPRO 100 UNIT/ML ~~LOC~~ SOLN
4.00 | SUBCUTANEOUS | Status: DC
Start: 2017-09-05 — End: 2017-09-04

## 2017-09-04 MED ORDER — LIDOCAINE HCL 1 % IJ SOLN
0.50 | INTRAMUSCULAR | Status: DC
Start: ? — End: 2017-09-04

## 2017-09-04 MED ORDER — POLYETHYLENE GLYCOL 3350 17 G PO PACK
17.00 | PACK | ORAL | Status: DC
Start: 2017-09-05 — End: 2017-09-04

## 2017-09-04 MED ORDER — INSULIN LISPRO 100 UNIT/ML ~~LOC~~ SOLN
0.00 | SUBCUTANEOUS | Status: DC
Start: 2017-09-04 — End: 2017-09-04

## 2017-09-07 DIAGNOSIS — Z940 Kidney transplant status: Secondary | ICD-10-CM | POA: Diagnosis not present

## 2017-09-08 DIAGNOSIS — E8770 Fluid overload, unspecified: Secondary | ICD-10-CM | POA: Diagnosis not present

## 2017-09-08 DIAGNOSIS — E1165 Type 2 diabetes mellitus with hyperglycemia: Secondary | ICD-10-CM | POA: Diagnosis not present

## 2017-09-08 DIAGNOSIS — E118 Type 2 diabetes mellitus with unspecified complications: Secondary | ICD-10-CM | POA: Diagnosis not present

## 2017-09-08 DIAGNOSIS — R0602 Shortness of breath: Secondary | ICD-10-CM | POA: Diagnosis not present

## 2017-09-08 DIAGNOSIS — Z792 Long term (current) use of antibiotics: Secondary | ICD-10-CM | POA: Diagnosis not present

## 2017-09-08 DIAGNOSIS — E782 Mixed hyperlipidemia: Secondary | ICD-10-CM | POA: Diagnosis present

## 2017-09-08 DIAGNOSIS — Z7982 Long term (current) use of aspirin: Secondary | ICD-10-CM | POA: Diagnosis not present

## 2017-09-08 DIAGNOSIS — R739 Hyperglycemia, unspecified: Secondary | ICD-10-CM | POA: Diagnosis not present

## 2017-09-08 DIAGNOSIS — K5900 Constipation, unspecified: Secondary | ICD-10-CM | POA: Diagnosis present

## 2017-09-08 DIAGNOSIS — R918 Other nonspecific abnormal finding of lung field: Secondary | ICD-10-CM | POA: Diagnosis not present

## 2017-09-08 DIAGNOSIS — Z79899 Other long term (current) drug therapy: Secondary | ICD-10-CM | POA: Diagnosis not present

## 2017-09-08 DIAGNOSIS — I10 Essential (primary) hypertension: Secondary | ICD-10-CM | POA: Diagnosis not present

## 2017-09-08 DIAGNOSIS — Z794 Long term (current) use of insulin: Secondary | ICD-10-CM | POA: Diagnosis not present

## 2017-09-08 DIAGNOSIS — Z940 Kidney transplant status: Secondary | ICD-10-CM | POA: Diagnosis not present

## 2017-09-08 DIAGNOSIS — D899 Disorder involving the immune mechanism, unspecified: Secondary | ICD-10-CM | POA: Diagnosis not present

## 2017-09-08 DIAGNOSIS — Z298 Encounter for other specified prophylactic measures: Secondary | ICD-10-CM | POA: Diagnosis not present

## 2017-09-08 DIAGNOSIS — T380X5A Adverse effect of glucocorticoids and synthetic analogues, initial encounter: Secondary | ICD-10-CM | POA: Diagnosis not present

## 2017-09-10 MED ORDER — MYCOPHENOLATE MOFETIL 250 MG PO CAPS
1000.00 | ORAL_CAPSULE | ORAL | Status: DC
Start: 2017-09-10 — End: 2017-09-10

## 2017-09-10 MED ORDER — SULFAMETHOXAZOLE-TRIMETHOPRIM 800-160 MG PO TABS
1.00 | ORAL_TABLET | ORAL | Status: DC
Start: 2017-09-13 — End: 2017-09-10

## 2017-09-10 MED ORDER — GENERIC EXTERNAL MEDICATION
30.00 | Status: DC
Start: 2017-09-11 — End: 2017-09-10

## 2017-09-10 MED ORDER — ACETAMINOPHEN 325 MG PO TABS
650.00 | ORAL_TABLET | ORAL | Status: DC
Start: ? — End: 2017-09-10

## 2017-09-10 MED ORDER — TACROLIMUS 1 MG PO CAPS
3.00 | ORAL_CAPSULE | ORAL | Status: DC
Start: 2017-09-10 — End: 2017-09-10

## 2017-09-10 MED ORDER — DEXTROSE 50 % IV SOLN
12.50 | INTRAVENOUS | Status: DC
Start: ? — End: 2017-09-10

## 2017-09-10 MED ORDER — INSULIN LISPRO 100 UNIT/ML ~~LOC~~ SOLN
0.00 | SUBCUTANEOUS | Status: DC
Start: 2017-09-10 — End: 2017-09-10

## 2017-09-10 MED ORDER — ASPIRIN EC 81 MG PO TBEC
81.00 | DELAYED_RELEASE_TABLET | ORAL | Status: DC
Start: 2017-09-11 — End: 2017-09-10

## 2017-09-10 MED ORDER — INSULIN LISPRO 100 UNIT/ML ~~LOC~~ SOLN
10.00 | SUBCUTANEOUS | Status: DC
Start: 2017-09-11 — End: 2017-09-10

## 2017-09-10 MED ORDER — HEPARIN SODIUM (PORCINE) 5000 UNIT/ML IJ SOLN
5000.00 | INTRAMUSCULAR | Status: DC
Start: 2017-09-11 — End: 2017-09-10

## 2017-09-10 MED ORDER — POLYETHYLENE GLYCOL 3350 17 G PO PACK
17.00 | PACK | ORAL | Status: DC
Start: 2017-09-11 — End: 2017-09-10

## 2017-09-10 MED ORDER — INSULIN LISPRO 100 UNIT/ML ~~LOC~~ SOLN
10.00 | SUBCUTANEOUS | Status: DC
Start: 2017-09-10 — End: 2017-09-10

## 2017-09-10 MED ORDER — TACROLIMUS 1 MG PO CAPS
4.00 | ORAL_CAPSULE | ORAL | Status: DC
Start: 2017-09-11 — End: 2017-09-10

## 2017-09-10 MED ORDER — ATORVASTATIN CALCIUM 40 MG PO TABS
40.00 | ORAL_TABLET | ORAL | Status: DC
Start: 2017-09-11 — End: 2017-09-10

## 2017-09-10 MED ORDER — CLONIDINE HCL 0.2 MG PO TABS
0.20 | ORAL_TABLET | ORAL | Status: DC
Start: 2017-09-10 — End: 2017-09-10

## 2017-09-10 MED ORDER — AMLODIPINE BESYLATE 10 MG PO TABS
10.00 | ORAL_TABLET | ORAL | Status: DC
Start: 2017-09-10 — End: 2017-09-10

## 2017-09-10 MED ORDER — GENERIC EXTERNAL MEDICATION
1.00 | Status: DC
Start: ? — End: 2017-09-10

## 2017-09-10 MED ORDER — INSULIN GLARGINE 100 UNIT/ML ~~LOC~~ SOLN
15.00 | SUBCUTANEOUS | Status: DC
Start: ? — End: 2017-09-10

## 2017-09-10 MED ORDER — INSULIN LISPRO 100 UNIT/ML ~~LOC~~ SOLN
8.00 | SUBCUTANEOUS | Status: DC
Start: ? — End: 2017-09-10

## 2017-09-10 MED ORDER — MELATONIN 3 MG PO TBDP
3.00 | ORAL_TABLET | ORAL | Status: DC
Start: ? — End: 2017-09-10

## 2017-09-10 MED ORDER — SENNOSIDES-DOCUSATE SODIUM 8.6-50 MG PO TABS
2.00 | ORAL_TABLET | ORAL | Status: DC
Start: 2017-09-10 — End: 2017-09-10

## 2017-09-10 MED ORDER — VALGANCICLOVIR HCL 450 MG PO TABS
450.00 | ORAL_TABLET | ORAL | Status: DC
Start: 2017-09-10 — End: 2017-09-10

## 2017-09-10 MED ORDER — BISACODYL 5 MG PO TBEC
10.00 | DELAYED_RELEASE_TABLET | ORAL | Status: DC
Start: ? — End: 2017-09-10

## 2017-09-10 MED ORDER — LIDOCAINE HCL 1 % IJ SOLN
.50 | INTRAMUSCULAR | Status: DC
Start: ? — End: 2017-09-10

## 2017-09-10 MED ORDER — RANITIDINE HCL 150 MG PO TABS
150.00 | ORAL_TABLET | ORAL | Status: DC
Start: 2017-09-10 — End: 2017-09-10

## 2017-09-11 MED ORDER — ACETAMINOPHEN 325 MG PO TABS
650.00 | ORAL_TABLET | ORAL | Status: DC
Start: ? — End: 2017-09-11

## 2017-09-11 MED ORDER — ASPIRIN EC 81 MG PO TBEC
81.00 | DELAYED_RELEASE_TABLET | ORAL | Status: DC
Start: 2017-09-11 — End: 2017-09-11

## 2017-09-11 MED ORDER — ATORVASTATIN CALCIUM 40 MG PO TABS
40.00 | ORAL_TABLET | ORAL | Status: DC
Start: 2017-09-11 — End: 2017-09-11

## 2017-09-11 MED ORDER — GENERIC EXTERNAL MEDICATION
30.00 | Status: DC
Start: 2017-09-11 — End: 2017-09-11

## 2017-09-11 MED ORDER — GENERIC EXTERNAL MEDICATION
1.00 | Status: DC
Start: ? — End: 2017-09-11

## 2017-09-11 MED ORDER — CLONIDINE HCL 0.2 MG PO TABS
0.20 | ORAL_TABLET | ORAL | Status: DC
Start: 2017-09-10 — End: 2017-09-11

## 2017-09-11 MED ORDER — POLYETHYLENE GLYCOL 3350 17 G PO PACK
17.00 | PACK | ORAL | Status: DC
Start: 2017-09-11 — End: 2017-09-11

## 2017-09-11 MED ORDER — INSULIN LISPRO 100 UNIT/ML ~~LOC~~ SOLN
0.00 | SUBCUTANEOUS | Status: DC
Start: 2017-09-10 — End: 2017-09-11

## 2017-09-11 MED ORDER — SENNOSIDES-DOCUSATE SODIUM 8.6-50 MG PO TABS
2.00 | ORAL_TABLET | ORAL | Status: DC
Start: 2017-09-10 — End: 2017-09-11

## 2017-09-11 MED ORDER — LIDOCAINE HCL 1 % IJ SOLN
0.50 | INTRAMUSCULAR | Status: DC
Start: ? — End: 2017-09-11

## 2017-09-11 MED ORDER — MELATONIN 3 MG PO TBDP
3.00 | ORAL_TABLET | ORAL | Status: DC
Start: ? — End: 2017-09-11

## 2017-09-11 MED ORDER — TACROLIMUS 1 MG PO CAPS
3.00 | ORAL_CAPSULE | ORAL | Status: DC
Start: 2017-09-10 — End: 2017-09-11

## 2017-09-11 MED ORDER — MYCOPHENOLATE MOFETIL 250 MG PO CAPS
1000.00 | ORAL_CAPSULE | ORAL | Status: DC
Start: 2017-09-10 — End: 2017-09-11

## 2017-09-11 MED ORDER — HEPARIN SODIUM (PORCINE) 5000 UNIT/ML IJ SOLN
5000.00 | INTRAMUSCULAR | Status: DC
Start: 2017-09-11 — End: 2017-09-11

## 2017-09-11 MED ORDER — DEXTROSE 50 % IV SOLN
12.50 | INTRAVENOUS | Status: DC
Start: ? — End: 2017-09-11

## 2017-09-11 MED ORDER — RANITIDINE HCL 150 MG PO TABS
150.00 | ORAL_TABLET | ORAL | Status: DC
Start: 2017-09-10 — End: 2017-09-11

## 2017-09-11 MED ORDER — AMLODIPINE BESYLATE 10 MG PO TABS
10.00 | ORAL_TABLET | ORAL | Status: DC
Start: 2017-09-10 — End: 2017-09-11

## 2017-09-11 MED ORDER — VALGANCICLOVIR HCL 450 MG PO TABS
450.00 | ORAL_TABLET | ORAL | Status: DC
Start: 2017-09-10 — End: 2017-09-11

## 2017-09-11 MED ORDER — SULFAMETHOXAZOLE-TRIMETHOPRIM 800-160 MG PO TABS
1.00 | ORAL_TABLET | ORAL | Status: DC
Start: 2017-09-13 — End: 2017-09-11

## 2017-09-15 DIAGNOSIS — Z940 Kidney transplant status: Secondary | ICD-10-CM | POA: Diagnosis not present

## 2017-09-17 DIAGNOSIS — N390 Urinary tract infection, site not specified: Secondary | ICD-10-CM | POA: Diagnosis not present

## 2017-09-17 DIAGNOSIS — E118 Type 2 diabetes mellitus with unspecified complications: Secondary | ICD-10-CM | POA: Diagnosis not present

## 2017-09-17 DIAGNOSIS — Z940 Kidney transplant status: Secondary | ICD-10-CM | POA: Diagnosis not present

## 2017-09-17 DIAGNOSIS — R739 Hyperglycemia, unspecified: Secondary | ICD-10-CM | POA: Diagnosis not present

## 2017-09-17 DIAGNOSIS — T380X5A Adverse effect of glucocorticoids and synthetic analogues, initial encounter: Secondary | ICD-10-CM | POA: Diagnosis not present

## 2017-09-17 DIAGNOSIS — E1165 Type 2 diabetes mellitus with hyperglycemia: Secondary | ICD-10-CM | POA: Diagnosis not present

## 2017-09-24 DIAGNOSIS — Z940 Kidney transplant status: Secondary | ICD-10-CM | POA: Diagnosis not present

## 2017-09-24 DIAGNOSIS — Z298 Encounter for other specified prophylactic measures: Secondary | ICD-10-CM | POA: Diagnosis not present

## 2017-10-15 DIAGNOSIS — E118 Type 2 diabetes mellitus with unspecified complications: Secondary | ICD-10-CM | POA: Diagnosis not present

## 2017-10-15 DIAGNOSIS — Z114 Encounter for screening for human immunodeficiency virus [HIV]: Secondary | ICD-10-CM | POA: Diagnosis not present

## 2017-10-15 DIAGNOSIS — Z8673 Personal history of transient ischemic attack (TIA), and cerebral infarction without residual deficits: Secondary | ICD-10-CM | POA: Diagnosis not present

## 2017-10-15 DIAGNOSIS — T380X5A Adverse effect of glucocorticoids and synthetic analogues, initial encounter: Secondary | ICD-10-CM | POA: Diagnosis not present

## 2017-10-15 DIAGNOSIS — Z205 Contact with and (suspected) exposure to viral hepatitis: Secondary | ICD-10-CM | POA: Diagnosis not present

## 2017-10-15 DIAGNOSIS — Z940 Kidney transplant status: Secondary | ICD-10-CM | POA: Diagnosis not present

## 2017-10-15 DIAGNOSIS — E1165 Type 2 diabetes mellitus with hyperglycemia: Secondary | ICD-10-CM | POA: Diagnosis not present

## 2017-10-15 DIAGNOSIS — R531 Weakness: Secondary | ICD-10-CM | POA: Diagnosis not present

## 2017-10-15 DIAGNOSIS — R29898 Other symptoms and signs involving the musculoskeletal system: Secondary | ICD-10-CM | POA: Diagnosis not present

## 2017-10-15 DIAGNOSIS — R739 Hyperglycemia, unspecified: Secondary | ICD-10-CM | POA: Diagnosis not present

## 2017-10-15 DIAGNOSIS — Z1159 Encounter for screening for other viral diseases: Secondary | ICD-10-CM | POA: Diagnosis not present

## 2017-10-28 DIAGNOSIS — Z940 Kidney transplant status: Secondary | ICD-10-CM | POA: Diagnosis not present

## 2017-10-28 DIAGNOSIS — M79622 Pain in left upper arm: Secondary | ICD-10-CM | POA: Diagnosis not present

## 2017-11-05 DIAGNOSIS — T82898A Other specified complication of vascular prosthetic devices, implants and grafts, initial encounter: Secondary | ICD-10-CM | POA: Diagnosis not present

## 2017-11-05 DIAGNOSIS — N186 End stage renal disease: Secondary | ICD-10-CM | POA: Diagnosis not present

## 2017-11-05 DIAGNOSIS — I120 Hypertensive chronic kidney disease with stage 5 chronic kidney disease or end stage renal disease: Secondary | ICD-10-CM | POA: Diagnosis not present

## 2017-11-12 DIAGNOSIS — E118 Type 2 diabetes mellitus with unspecified complications: Secondary | ICD-10-CM | POA: Diagnosis not present

## 2017-11-12 DIAGNOSIS — D899 Disorder involving the immune mechanism, unspecified: Secondary | ICD-10-CM | POA: Diagnosis not present

## 2017-11-12 DIAGNOSIS — Z792 Long term (current) use of antibiotics: Secondary | ICD-10-CM | POA: Diagnosis not present

## 2017-11-12 DIAGNOSIS — T380X5A Adverse effect of glucocorticoids and synthetic analogues, initial encounter: Secondary | ICD-10-CM | POA: Diagnosis not present

## 2017-11-12 DIAGNOSIS — R739 Hyperglycemia, unspecified: Secondary | ICD-10-CM | POA: Diagnosis not present

## 2017-11-12 DIAGNOSIS — E1165 Type 2 diabetes mellitus with hyperglycemia: Secondary | ICD-10-CM | POA: Diagnosis not present

## 2017-11-12 DIAGNOSIS — Z940 Kidney transplant status: Secondary | ICD-10-CM | POA: Diagnosis not present

## 2017-11-12 DIAGNOSIS — I10 Essential (primary) hypertension: Secondary | ICD-10-CM | POA: Diagnosis not present

## 2017-12-06 IMAGING — US US ABDOMEN COMPLETE
1 series · 13 of 25 positions shown · non-contrast
Comparison: None in PACs

CLINICAL DATA: Acute pancreatitis manifested by intermittent
abdominal pain for the past 4 weeks. History of renal disease and
diabetes.

EXAM:
ABDOMEN ULTRASOUND COMPLETE

[Series 1: us abdomen complete · 0.18mm/px · 13 of 106 slices shown]
[im 1/106]
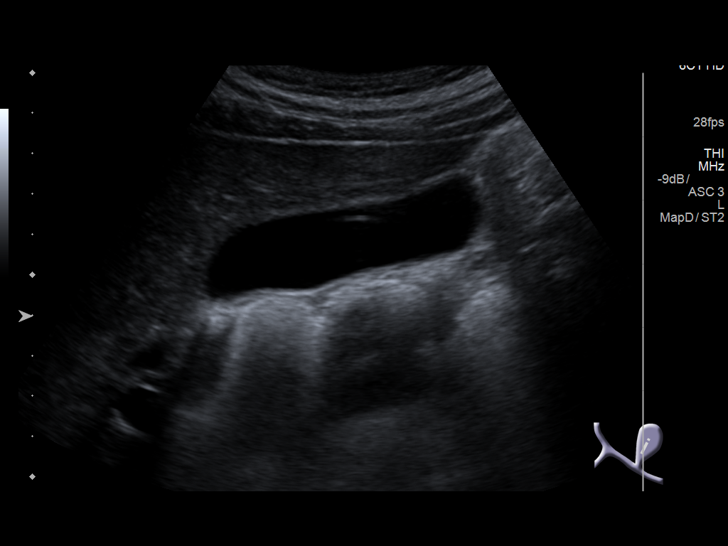
[im 9/106]
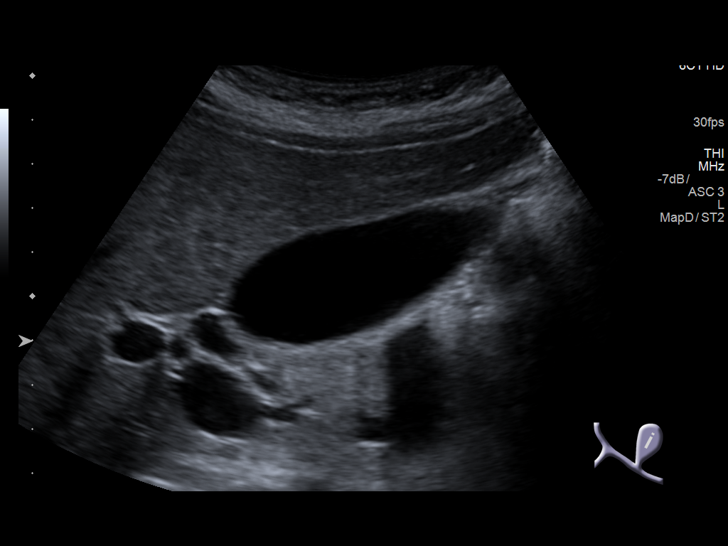
[im 18/106]
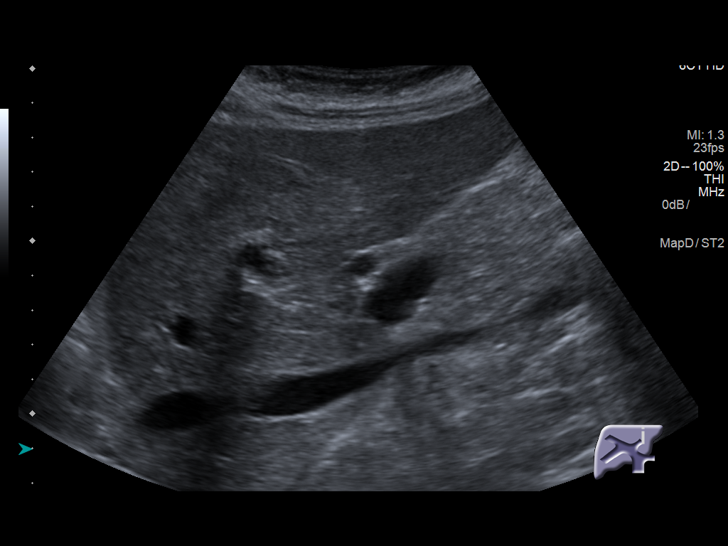
[im 27/106]
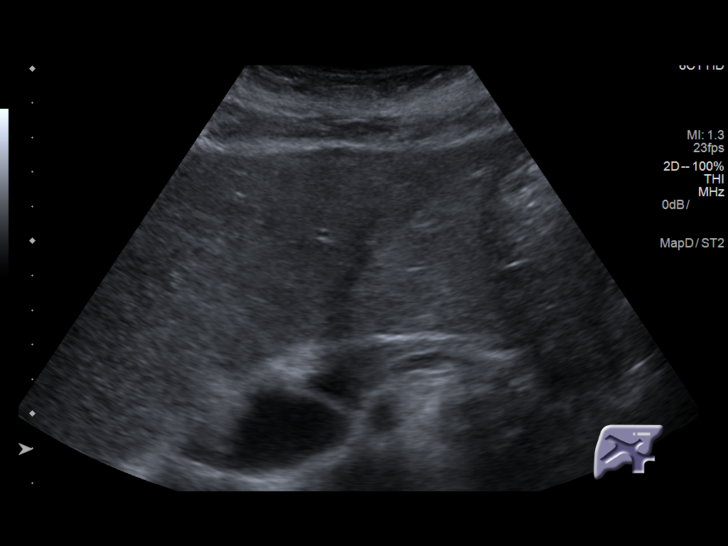
[im 36/106]
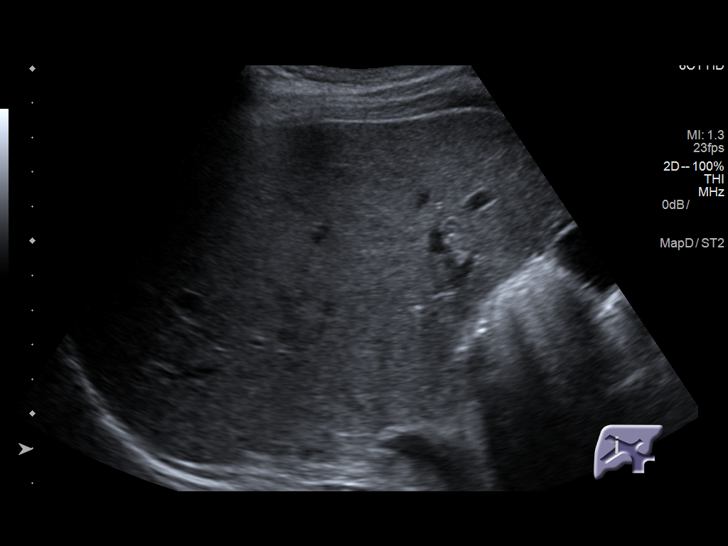
[im 44/106]
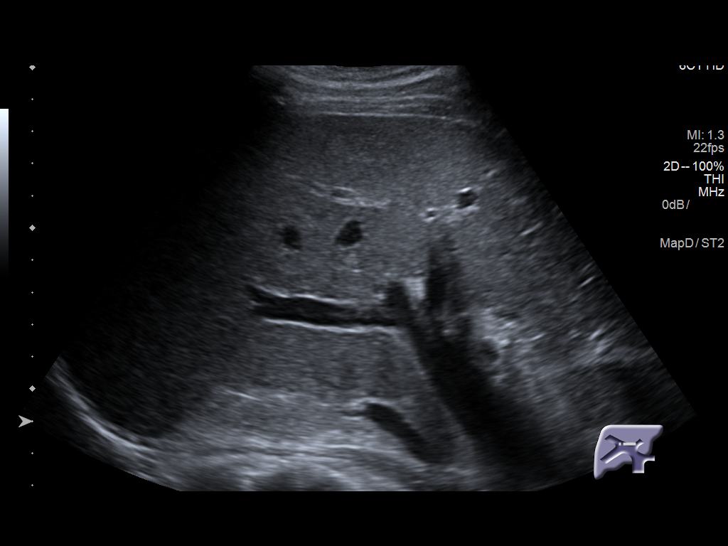
[im 53/106]
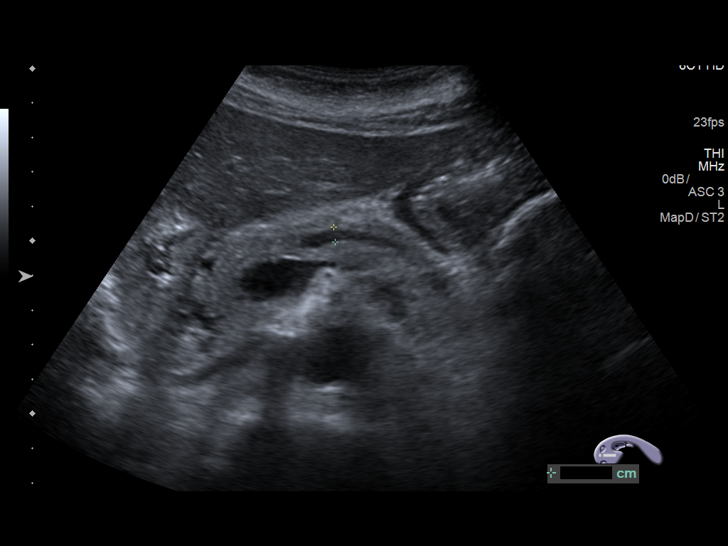
[im 62/106]
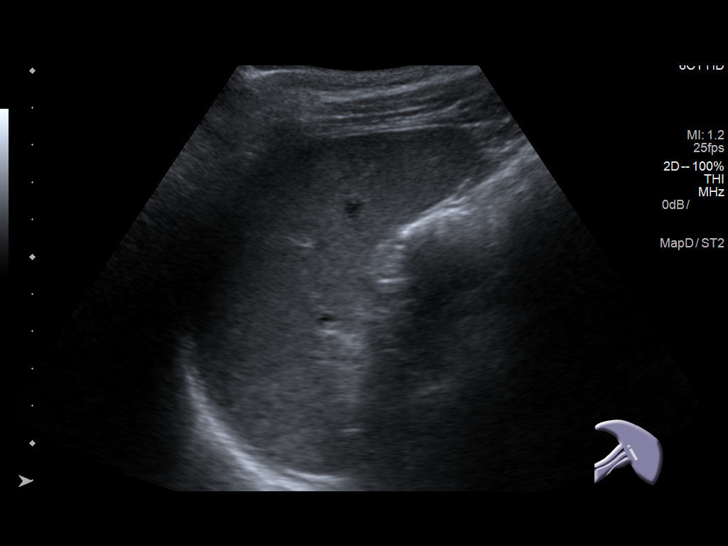
[im 71/106]
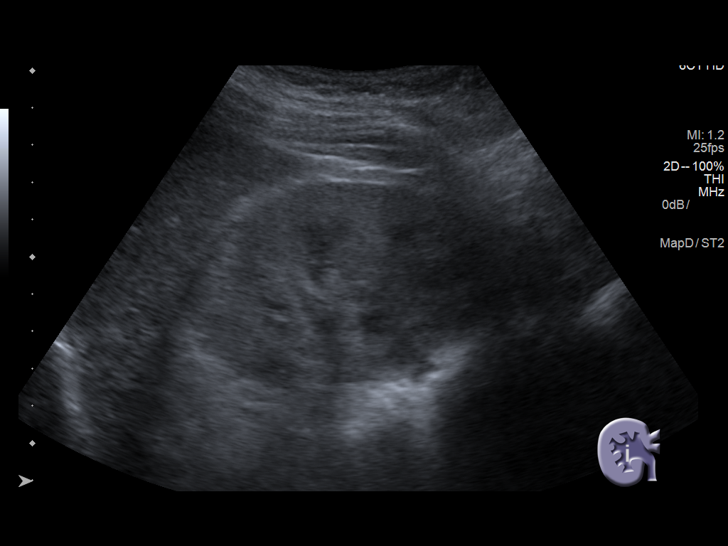
[im 79/106]
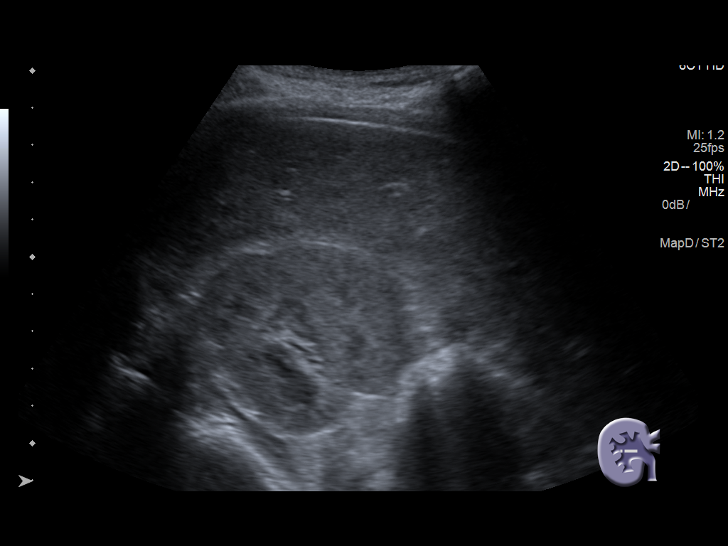
[im 88/106]
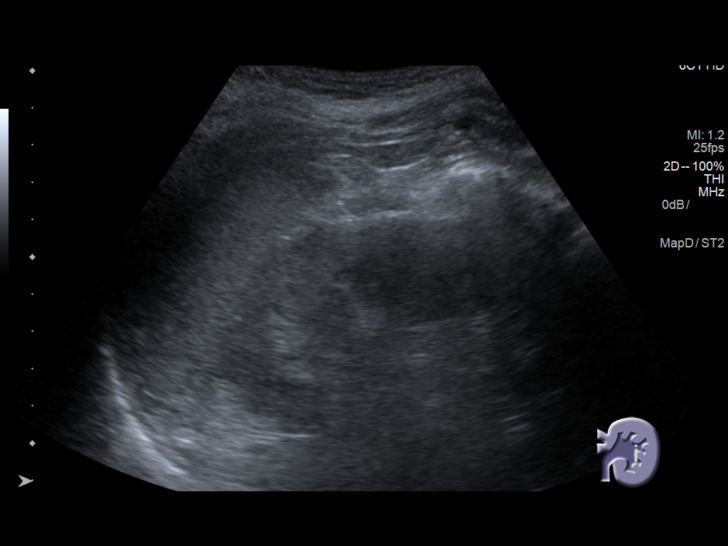
[im 97/106]
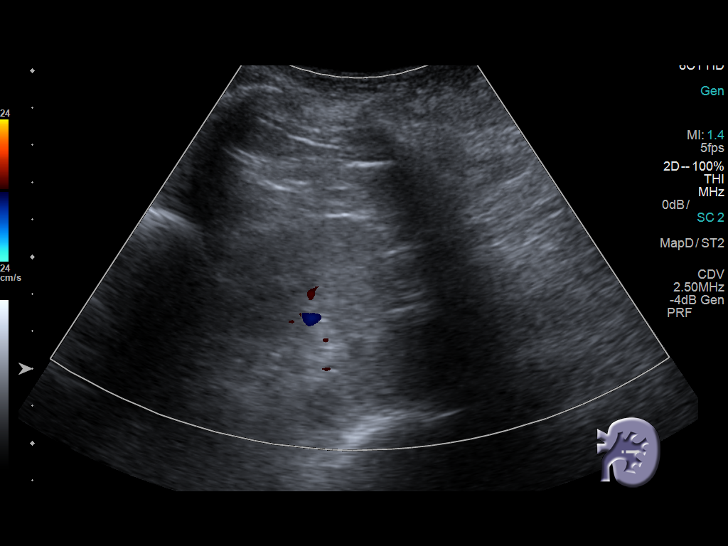
[im 106/106]
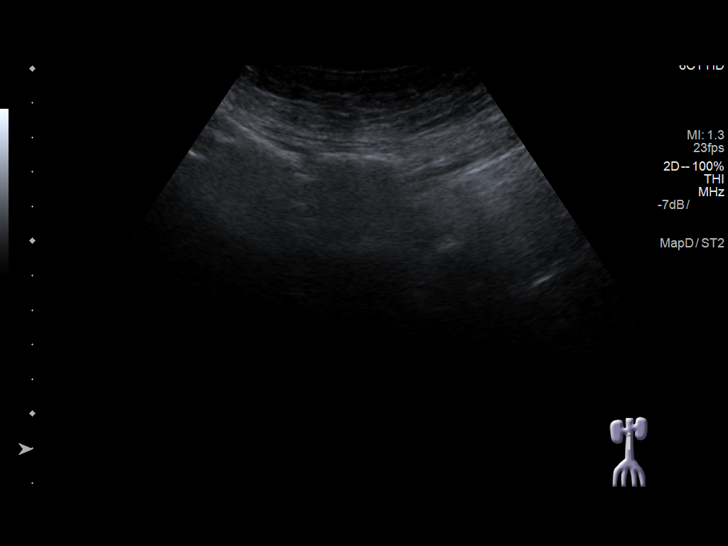

[13 of 25 positions shown; findings below may reference images not displayed]

FINDINGS: Gallbladder: No gallstones or wall thickening visualized. No
sonographic Murphy sign noted by sonographer.

Common bile duct: Diameter: 5.3 mm. No intraluminal stones are
observed.

Liver: No focal lesion identified. Within normal limits in
parenchymal echogenicity.

IVC: No abnormality visualized.

Pancreas: The pancreas exhibits normal echotexture. The pancreatic
duct appears mildly dilated in the pancreatic body measuring up to
4.3 mm. No peripancreatic fluid collections are observed.

Spleen: Size and appearance within normal limits.

Right Kidney: Length: 9.9 cm. The renal cortical echotexture exceeds
that of the liver. There is no focal mass nor hydronephrosis.

Left Kidney: Length: 9.9 cm. The renal cortical echotexture is
diffusely increased similar to that on the right. No hydronephrosis
is observed.

Abdominal aorta: Only the proximal aorta was visible due to bowel
gas. It exhibited normal diameter 2.1 cm.

Other findings: There is no ascites.
IMPRESSION: 1. No acute abnormality of the liver, common bile duct, liver, or
spleen.
2. Mild dilation of the pancreatic duct to 4.3 mm. No pancreatic
mass or edema is observed. There is no peripancreatic fluid
collection.
3. Increased renal cortical echotexture bilaterally consistent with
medical renal disease. There is no hydronephrosis.

## 2017-12-24 DIAGNOSIS — R739 Hyperglycemia, unspecified: Secondary | ICD-10-CM | POA: Diagnosis not present

## 2017-12-24 DIAGNOSIS — T380X5A Adverse effect of glucocorticoids and synthetic analogues, initial encounter: Secondary | ICD-10-CM | POA: Diagnosis not present

## 2017-12-24 DIAGNOSIS — D899 Disorder involving the immune mechanism, unspecified: Secondary | ICD-10-CM | POA: Diagnosis not present

## 2017-12-24 DIAGNOSIS — Z940 Kidney transplant status: Secondary | ICD-10-CM | POA: Diagnosis not present

## 2017-12-24 DIAGNOSIS — Z23 Encounter for immunization: Secondary | ICD-10-CM | POA: Diagnosis not present

## 2017-12-24 DIAGNOSIS — E118 Type 2 diabetes mellitus with unspecified complications: Secondary | ICD-10-CM | POA: Diagnosis not present

## 2017-12-24 DIAGNOSIS — E1165 Type 2 diabetes mellitus with hyperglycemia: Secondary | ICD-10-CM | POA: Diagnosis not present

## 2018-01-10 ENCOUNTER — Other Ambulatory Visit: Payer: Self-pay | Admitting: Endocrinology

## 2018-02-15 ENCOUNTER — Telehealth: Payer: Self-pay | Admitting: *Deleted

## 2018-02-15 NOTE — Telephone Encounter (Signed)
Tried calling again no answer. Pt has appt tomorrow. Dr Nicki Reaper states he will order bloodwork tomorrow.

## 2018-02-15 NOTE — Telephone Encounter (Signed)
Left message to return call 

## 2018-02-15 NOTE — Telephone Encounter (Signed)
Please have the patient do a CBC, liver and a metabolic 7-if the patient is willing she can do the blood work at [facility] today and we would have the results when she comes in tomorrow   Also she could do a urinalysis here at the office by dropping by this afternoon to give a urine specimen   Still would be a good idea for her to follow-up tomorrow as planned

## 2018-02-15 NOTE — Telephone Encounter (Signed)
Pt called and wanted appt today. Having weakness all over and headache. No fever. Thought she had a cold and tried mucinex but vomits every time she takes mucinex she vomits.

## 2018-02-16 ENCOUNTER — Encounter: Payer: Self-pay | Admitting: Family Medicine

## 2018-02-16 ENCOUNTER — Ambulatory Visit (INDEPENDENT_AMBULATORY_CARE_PROVIDER_SITE_OTHER): Payer: BLUE CROSS/BLUE SHIELD | Admitting: Family Medicine

## 2018-02-16 ENCOUNTER — Other Ambulatory Visit (HOSPITAL_COMMUNITY)
Admission: RE | Admit: 2018-02-16 | Discharge: 2018-02-16 | Disposition: A | Discharge status: Home / Self Care | Payer: BLUE CROSS/BLUE SHIELD | Source: Ambulatory Visit | Attending: Family Medicine | Admitting: Family Medicine

## 2018-02-16 ENCOUNTER — Ambulatory Visit (HOSPITAL_COMMUNITY)
Admission: RE | Admit: 2018-02-16 | Discharge: 2018-02-16 | Disposition: A | Discharge status: Home / Self Care | Payer: BLUE CROSS/BLUE SHIELD | Source: Ambulatory Visit | Attending: Family Medicine | Admitting: Family Medicine

## 2018-02-16 VITALS — BP 100/60 | Temp 98.4°F | Ht 66.0 in | Wt 203.0 lb

## 2018-02-16 DIAGNOSIS — Z90710 Acquired absence of both cervix and uterus: Secondary | ICD-10-CM | POA: Diagnosis not present

## 2018-02-16 DIAGNOSIS — T380X5A Adverse effect of glucocorticoids and synthetic analogues, initial encounter: Secondary | ICD-10-CM | POA: Diagnosis not present

## 2018-02-16 DIAGNOSIS — D899 Disorder involving the immune mechanism, unspecified: Secondary | ICD-10-CM | POA: Diagnosis not present

## 2018-02-16 DIAGNOSIS — M25461 Effusion, right knee: Secondary | ICD-10-CM | POA: Diagnosis not present

## 2018-02-16 DIAGNOSIS — J852 Abscess of lung without pneumonia: Secondary | ICD-10-CM | POA: Diagnosis not present

## 2018-02-16 DIAGNOSIS — D638 Anemia in other chronic diseases classified elsewhere: Secondary | ICD-10-CM | POA: Diagnosis not present

## 2018-02-16 DIAGNOSIS — B965 Pseudomonas (aeruginosa) (mallei) (pseudomallei) as the cause of diseases classified elsewhere: Secondary | ICD-10-CM | POA: Diagnosis present

## 2018-02-16 DIAGNOSIS — Z1623 Resistance to quinolones and fluoroquinolones: Secondary | ICD-10-CM | POA: Diagnosis present

## 2018-02-16 DIAGNOSIS — J189 Pneumonia, unspecified organism: Secondary | ICD-10-CM | POA: Diagnosis not present

## 2018-02-16 DIAGNOSIS — S31829A Unspecified open wound of left buttock, initial encounter: Secondary | ICD-10-CM | POA: Diagnosis not present

## 2018-02-16 DIAGNOSIS — R5383 Other fatigue: Secondary | ICD-10-CM

## 2018-02-16 DIAGNOSIS — E860 Dehydration: Secondary | ICD-10-CM | POA: Diagnosis present

## 2018-02-16 DIAGNOSIS — I82621 Acute embolism and thrombosis of deep veins of right upper extremity: Secondary | ICD-10-CM | POA: Diagnosis not present

## 2018-02-16 DIAGNOSIS — Z5181 Encounter for therapeutic drug level monitoring: Secondary | ICD-10-CM | POA: Diagnosis not present

## 2018-02-16 DIAGNOSIS — E639 Nutritional deficiency, unspecified: Secondary | ICD-10-CM | POA: Diagnosis not present

## 2018-02-16 DIAGNOSIS — B389 Coccidioidomycosis, unspecified: Secondary | ICD-10-CM | POA: Diagnosis not present

## 2018-02-16 DIAGNOSIS — I120 Hypertensive chronic kidney disease with stage 5 chronic kidney disease or end stage renal disease: Secondary | ICD-10-CM | POA: Diagnosis not present

## 2018-02-16 DIAGNOSIS — E119 Type 2 diabetes mellitus without complications: Secondary | ICD-10-CM | POA: Diagnosis not present

## 2018-02-16 DIAGNOSIS — E878 Other disorders of electrolyte and fluid balance, not elsewhere classified: Secondary | ICD-10-CM | POA: Diagnosis not present

## 2018-02-16 DIAGNOSIS — B371 Pulmonary candidiasis: Secondary | ICD-10-CM | POA: Diagnosis not present

## 2018-02-16 DIAGNOSIS — N990 Postprocedural (acute) (chronic) kidney failure: Secondary | ICD-10-CM | POA: Diagnosis not present

## 2018-02-16 DIAGNOSIS — N390 Urinary tract infection, site not specified: Secondary | ICD-10-CM | POA: Diagnosis not present

## 2018-02-16 DIAGNOSIS — B465 Mucormycosis, unspecified: Secondary | ICD-10-CM | POA: Diagnosis not present

## 2018-02-16 DIAGNOSIS — R059 Cough, unspecified: Secondary | ICD-10-CM

## 2018-02-16 DIAGNOSIS — B349 Viral infection, unspecified: Secondary | ICD-10-CM | POA: Diagnosis not present

## 2018-02-16 DIAGNOSIS — D520 Dietary folate deficiency anemia: Secondary | ICD-10-CM | POA: Diagnosis not present

## 2018-02-16 DIAGNOSIS — I517 Cardiomegaly: Secondary | ICD-10-CM | POA: Diagnosis not present

## 2018-02-16 DIAGNOSIS — J42 Unspecified chronic bronchitis: Secondary | ICD-10-CM | POA: Diagnosis not present

## 2018-02-16 DIAGNOSIS — N170 Acute kidney failure with tubular necrosis: Secondary | ICD-10-CM | POA: Diagnosis not present

## 2018-02-16 DIAGNOSIS — J810 Acute pulmonary edema: Secondary | ICD-10-CM | POA: Diagnosis not present

## 2018-02-16 DIAGNOSIS — R05 Cough: Secondary | ICD-10-CM | POA: Diagnosis not present

## 2018-02-16 DIAGNOSIS — D649 Anemia, unspecified: Secondary | ICD-10-CM | POA: Diagnosis not present

## 2018-02-16 DIAGNOSIS — J850 Gangrene and necrosis of lung: Secondary | ICD-10-CM | POA: Diagnosis not present

## 2018-02-16 DIAGNOSIS — Z940 Kidney transplant status: Secondary | ICD-10-CM | POA: Diagnosis not present

## 2018-02-16 DIAGNOSIS — F4321 Adjustment disorder with depressed mood: Secondary | ICD-10-CM | POA: Diagnosis not present

## 2018-02-16 DIAGNOSIS — R0989 Other specified symptoms and signs involving the circulatory and respiratory systems: Secondary | ICD-10-CM | POA: Diagnosis not present

## 2018-02-16 DIAGNOSIS — J948 Other specified pleural conditions: Secondary | ICD-10-CM | POA: Diagnosis not present

## 2018-02-16 DIAGNOSIS — Z902 Acquired absence of lung [part of]: Secondary | ICD-10-CM | POA: Diagnosis not present

## 2018-02-16 DIAGNOSIS — Z79899 Other long term (current) drug therapy: Secondary | ICD-10-CM | POA: Diagnosis not present

## 2018-02-16 DIAGNOSIS — R51 Headache: Secondary | ICD-10-CM | POA: Diagnosis not present

## 2018-02-16 DIAGNOSIS — N179 Acute kidney failure, unspecified: Secondary | ICD-10-CM | POA: Diagnosis not present

## 2018-02-16 DIAGNOSIS — I10 Essential (primary) hypertension: Secondary | ICD-10-CM | POA: Diagnosis not present

## 2018-02-16 DIAGNOSIS — R45851 Suicidal ideations: Secondary | ICD-10-CM | POA: Diagnosis not present

## 2018-02-16 DIAGNOSIS — F0630 Mood disorder due to known physiological condition, unspecified: Secondary | ICD-10-CM | POA: Diagnosis not present

## 2018-02-16 DIAGNOSIS — R0602 Shortness of breath: Secondary | ICD-10-CM | POA: Diagnosis not present

## 2018-02-16 DIAGNOSIS — J90 Pleural effusion, not elsewhere classified: Secondary | ICD-10-CM | POA: Diagnosis not present

## 2018-02-16 DIAGNOSIS — G8918 Other acute postprocedural pain: Secondary | ICD-10-CM | POA: Diagnosis not present

## 2018-02-16 DIAGNOSIS — E782 Mixed hyperlipidemia: Secondary | ICD-10-CM | POA: Diagnosis not present

## 2018-02-16 DIAGNOSIS — T8619 Other complication of kidney transplant: Secondary | ICD-10-CM | POA: Diagnosis present

## 2018-02-16 DIAGNOSIS — I313 Pericardial effusion (noninflammatory): Secondary | ICD-10-CM | POA: Diagnosis not present

## 2018-02-16 DIAGNOSIS — B259 Cytomegaloviral disease, unspecified: Secondary | ICD-10-CM | POA: Diagnosis not present

## 2018-02-16 DIAGNOSIS — Z794 Long term (current) use of insulin: Secondary | ICD-10-CM | POA: Diagnosis not present

## 2018-02-16 DIAGNOSIS — E871 Hypo-osmolality and hyponatremia: Secondary | ICD-10-CM | POA: Diagnosis not present

## 2018-02-16 DIAGNOSIS — N186 End stage renal disease: Secondary | ICD-10-CM | POA: Diagnosis not present

## 2018-02-16 DIAGNOSIS — J984 Other disorders of lung: Secondary | ICD-10-CM | POA: Diagnosis not present

## 2018-02-16 DIAGNOSIS — R079 Chest pain, unspecified: Secondary | ICD-10-CM | POA: Diagnosis not present

## 2018-02-16 DIAGNOSIS — K5900 Constipation, unspecified: Secondary | ICD-10-CM | POA: Diagnosis not present

## 2018-02-16 DIAGNOSIS — E1165 Type 2 diabetes mellitus with hyperglycemia: Secondary | ICD-10-CM | POA: Diagnosis not present

## 2018-02-16 DIAGNOSIS — J811 Chronic pulmonary edema: Secondary | ICD-10-CM | POA: Diagnosis not present

## 2018-02-16 DIAGNOSIS — S31809A Unspecified open wound of unspecified buttock, initial encounter: Secondary | ICD-10-CM | POA: Diagnosis not present

## 2018-02-16 DIAGNOSIS — Z452 Encounter for adjustment and management of vascular access device: Secondary | ICD-10-CM | POA: Diagnosis not present

## 2018-02-16 DIAGNOSIS — Z751 Person awaiting admission to adequate facility elsewhere: Secondary | ICD-10-CM | POA: Diagnosis not present

## 2018-02-16 DIAGNOSIS — S31809S Unspecified open wound of unspecified buttock, sequela: Secondary | ICD-10-CM | POA: Diagnosis not present

## 2018-02-16 DIAGNOSIS — M6281 Muscle weakness (generalized): Secondary | ICD-10-CM | POA: Diagnosis not present

## 2018-02-16 DIAGNOSIS — R739 Hyperglycemia, unspecified: Secondary | ICD-10-CM | POA: Diagnosis not present

## 2018-02-16 DIAGNOSIS — R7989 Other specified abnormal findings of blood chemistry: Secondary | ICD-10-CM | POA: Diagnosis not present

## 2018-02-16 DIAGNOSIS — Z792 Long term (current) use of antibiotics: Secondary | ICD-10-CM | POA: Diagnosis not present

## 2018-02-16 DIAGNOSIS — R262 Difficulty in walking, not elsewhere classified: Secondary | ICD-10-CM | POA: Diagnosis not present

## 2018-02-16 DIAGNOSIS — E11649 Type 2 diabetes mellitus with hypoglycemia without coma: Secondary | ICD-10-CM | POA: Diagnosis not present

## 2018-02-16 DIAGNOSIS — J209 Acute bronchitis, unspecified: Secondary | ICD-10-CM | POA: Diagnosis not present

## 2018-02-16 DIAGNOSIS — R918 Other nonspecific abnormal finding of lung field: Secondary | ICD-10-CM | POA: Diagnosis not present

## 2018-02-16 DIAGNOSIS — B460 Pulmonary mucormycosis: Secondary | ICD-10-CM | POA: Diagnosis not present

## 2018-02-16 DIAGNOSIS — M7989 Other specified soft tissue disorders: Secondary | ICD-10-CM | POA: Diagnosis not present

## 2018-02-16 DIAGNOSIS — Z9189 Other specified personal risk factors, not elsewhere classified: Secondary | ICD-10-CM | POA: Diagnosis not present

## 2018-02-16 DIAGNOSIS — J9811 Atelectasis: Secondary | ICD-10-CM | POA: Diagnosis not present

## 2018-02-16 DIAGNOSIS — E118 Type 2 diabetes mellitus with unspecified complications: Secondary | ICD-10-CM | POA: Diagnosis not present

## 2018-02-16 DIAGNOSIS — Z9912 Encounter for respirator [ventilator] dependence during power failure: Secondary | ICD-10-CM | POA: Diagnosis not present

## 2018-02-16 DIAGNOSIS — A498 Other bacterial infections of unspecified site: Secondary | ICD-10-CM | POA: Diagnosis not present

## 2018-02-16 DIAGNOSIS — J851 Abscess of lung with pneumonia: Secondary | ICD-10-CM | POA: Diagnosis not present

## 2018-02-16 DIAGNOSIS — R0902 Hypoxemia: Secondary | ICD-10-CM | POA: Diagnosis not present

## 2018-02-16 DIAGNOSIS — Z9981 Dependence on supplemental oxygen: Secondary | ICD-10-CM | POA: Diagnosis not present

## 2018-02-16 DIAGNOSIS — R911 Solitary pulmonary nodule: Secondary | ICD-10-CM | POA: Diagnosis not present

## 2018-02-16 DIAGNOSIS — E785 Hyperlipidemia, unspecified: Secondary | ICD-10-CM | POA: Diagnosis not present

## 2018-02-16 LAB — CBC WITH DIFFERENTIAL/PLATELET
Abs Immature Granulocytes: 3.31 10*3/uL — ABNORMAL HIGH (ref 0.00–0.07)
Basophils Absolute: 0.1 10*3/uL (ref 0.0–0.1)
Basophils Relative: 0 %
Eosinophils Absolute: 0 10*3/uL (ref 0.0–0.5)
Eosinophils Relative: 0 %
HCT: 34.5 % — ABNORMAL LOW (ref 36.0–46.0)
Hemoglobin: 10.9 g/dL — ABNORMAL LOW (ref 12.0–15.0)
Immature Granulocytes: 25 %
Lymphocytes Relative: 5 %
Lymphs Abs: 0.6 10*3/uL — ABNORMAL LOW (ref 0.7–4.0)
MCH: 30.6 pg (ref 26.0–34.0)
MCHC: 31.6 g/dL (ref 30.0–36.0)
MCV: 96.9 fL (ref 80.0–100.0)
Monocytes Absolute: 0.9 10*3/uL (ref 0.1–1.0)
Monocytes Relative: 7 %
Neutro Abs: 8.2 10*3/uL — ABNORMAL HIGH (ref 1.7–7.7)
Neutrophils Relative %: 63 %
Platelets: 400 10*3/uL (ref 150–400)
RBC: 3.56 MIL/uL — ABNORMAL LOW (ref 3.87–5.11)
RDW: 12.4 % (ref 11.5–15.5)
WBC Morphology: INCREASED
WBC: 13.1 10*3/uL — ABNORMAL HIGH (ref 4.0–10.5)
nRBC: 0 % (ref 0.0–0.2)

## 2018-02-16 LAB — BASIC METABOLIC PANEL
Anion gap: 12 (ref 5–15)
BUN: 27 mg/dL — ABNORMAL HIGH (ref 8–23)
CO2: 17 mmol/L — ABNORMAL LOW (ref 22–32)
Calcium: 9.1 mg/dL (ref 8.9–10.3)
Chloride: 102 mmol/L (ref 98–111)
Creatinine, Ser: 1.68 mg/dL — ABNORMAL HIGH (ref 0.44–1.00)
GFR calc Af Amer: 36 mL/min — ABNORMAL LOW (ref >60)
GFR calc non Af Amer: 31 mL/min — ABNORMAL LOW (ref >60)
Glucose, Bld: 495 mg/dL — ABNORMAL HIGH (ref 70–99)
Potassium: 4.4 mmol/L (ref 3.5–5.1)
Sodium: 131 mmol/L — ABNORMAL LOW (ref 135–145)

## 2018-02-16 LAB — HEPATIC FUNCTION PANEL
ALT: 11 U/L (ref 0–44)
AST: 14 U/L — ABNORMAL LOW (ref 15–41)
Albumin: 3.2 g/dL — ABNORMAL LOW (ref 3.5–5.0)
Alkaline Phosphatase: 96 U/L (ref 38–126)
Bilirubin, Direct: 0.2 mg/dL (ref 0.0–0.2)
Indirect Bilirubin: 0.8 mg/dL (ref 0.3–0.9)
Total Bilirubin: 1 mg/dL (ref 0.3–1.2)
Total Protein: 6.8 g/dL (ref 6.5–8.1)

## 2018-02-16 LAB — POCT HEMOGLOBIN: Hemoglobin: 10.2 g/dL (ref 9.5–13.5)

## 2018-02-16 NOTE — Progress Notes (Signed)
   Subjective:    Patient ID: Shelley [name], female    DOB: 09-04-1950, 67 y.o.   MRN: [id number]  HPI  Patient is here today with complaints of a headache with some weakness as well, she is also having some vomiting and diarrhea,cough and abdominal pain, she says she sometimes feels like her chest is burning"Heated".She is a living donor kidney transplant. She received this in June of this year.She called [place] transplant [facility] and they told her to come her or go to an urgent care.She has been taking tylenol and it has helped some with the headaches.Patient Hgb checked in the office today,results were 10.2. Pt states she does not take an iron pill.  Review of Systems  Constitutional: Positive for fatigue. Negative for activity change, appetite change, diaphoresis and fever.  HENT: Negative for congestion and rhinorrhea.   Respiratory: Positive for cough and shortness of breath.   Cardiovascular: Negative for chest pain and leg swelling.  Gastrointestinal: Negative for abdominal pain and diarrhea.  Endocrine: Positive for polyuria. Negative for polydipsia and polyphagia.  Skin: Negative for color change.  Neurological: Negative for dizziness and weakness.  Psychiatric/Behavioral: Negative for behavioral problems and confusion.       Results for orders placed or performed in visit on 02/16/18  POCT hemoglobin  Result Value Ref Range   Hemoglobin 10.2 9.5 - 13.5 g/dL    Objective:   Physical Exam  Constitutional: She appears well-nourished. No distress.  HENT:  Head: Normocephalic and atraumatic.  Eyes: Right eye exhibits no discharge. Left eye exhibits no discharge.  Neck: No tracheal deviation present.  Cardiovascular: Normal rate, regular rhythm and normal heart sounds.  No murmur heard. Pulmonary/Chest: Effort normal and breath sounds normal. No respiratory distress.  Musculoskeletal: She exhibits no edema.  Lymphadenopathy:    She has no cervical adenopathy.  Neurological:  She is alert. Coordination normal.  Skin: Skin is warm and dry.  Psychiatric: She has a normal mood and affect. Her behavior is normal.  Vitals reviewed.  Abdomen soft no guarding or rebound No respiratory distress   40 minutes was spent with the patient.  This statement verifies that 40 minutes was indeed spent with the patient.  More than 50% of this visit-total duration of the visit-was spent in counseling and coordination of care. The issues that the patient came in for today as reflected in the diagnosis (s) please refer to documentation for further details.     Assessment & Plan:  Significant fatigue, mild hypertension, cough Patient is a transplant patient-high risk of infection Stat lab work stat x-ray indicated Depending on results may have to go to [place] amlodipine recommend 5 mg daily Patient did not want to go to local ER for further evaluation  Addendum Sodium low at 131 glucose elevated 495 creatinine now elevated 1.68 white blood count up to 13,000 chest x-ray shows cavitary mass needs immediate attention probable CAT scan More than likely abscess will need admission IV antibiotics [place] care coordinator was spoke with regarding this patient they will be expecting the patient at the ER at [facility] ER   Patient was spoken with and agrees to go to [facility] ER for further evaluation

## 2018-04-13 DIAGNOSIS — R918 Other nonspecific abnormal finding of lung field: Secondary | ICD-10-CM | POA: Diagnosis not present

## 2018-04-13 DIAGNOSIS — R7989 Other specified abnormal findings of blood chemistry: Secondary | ICD-10-CM | POA: Diagnosis not present

## 2018-04-13 DIAGNOSIS — R262 Difficulty in walking, not elsewhere classified: Secondary | ICD-10-CM | POA: Diagnosis not present

## 2018-04-13 DIAGNOSIS — E1122 Type 2 diabetes mellitus with diabetic chronic kidney disease: Secondary | ICD-10-CM | POA: Diagnosis not present

## 2018-04-13 DIAGNOSIS — Z902 Acquired absence of lung [part of]: Secondary | ICD-10-CM | POA: Diagnosis not present

## 2018-04-13 DIAGNOSIS — T8619 Other complication of kidney transplant: Secondary | ICD-10-CM | POA: Diagnosis not present

## 2018-04-13 DIAGNOSIS — N390 Urinary tract infection, site not specified: Secondary | ICD-10-CM | POA: Diagnosis not present

## 2018-04-13 DIAGNOSIS — R5381 Other malaise: Secondary | ICD-10-CM | POA: Diagnosis not present

## 2018-04-13 DIAGNOSIS — Z9912 Encounter for respirator [ventilator] dependence during power failure: Secondary | ICD-10-CM | POA: Diagnosis not present

## 2018-04-13 DIAGNOSIS — A498 Other bacterial infections of unspecified site: Secondary | ICD-10-CM | POA: Diagnosis not present

## 2018-04-13 DIAGNOSIS — N186 End stage renal disease: Secondary | ICD-10-CM | POA: Diagnosis not present

## 2018-04-13 DIAGNOSIS — Z794 Long term (current) use of insulin: Secondary | ICD-10-CM | POA: Diagnosis not present

## 2018-04-13 DIAGNOSIS — I10 Essential (primary) hypertension: Secondary | ICD-10-CM | POA: Diagnosis not present

## 2018-04-13 DIAGNOSIS — E785 Hyperlipidemia, unspecified: Secondary | ICD-10-CM | POA: Diagnosis not present

## 2018-04-13 DIAGNOSIS — J939 Pneumothorax, unspecified: Secondary | ICD-10-CM | POA: Diagnosis not present

## 2018-04-13 DIAGNOSIS — Z79899 Other long term (current) drug therapy: Secondary | ICD-10-CM | POA: Diagnosis not present

## 2018-04-13 DIAGNOSIS — N990 Postprocedural (acute) (chronic) kidney failure: Secondary | ICD-10-CM | POA: Diagnosis not present

## 2018-04-13 DIAGNOSIS — Z7982 Long term (current) use of aspirin: Secondary | ICD-10-CM | POA: Diagnosis not present

## 2018-04-13 DIAGNOSIS — I313 Pericardial effusion (noninflammatory): Secondary | ICD-10-CM | POA: Diagnosis not present

## 2018-04-13 DIAGNOSIS — F4321 Adjustment disorder with depressed mood: Secondary | ICD-10-CM | POA: Diagnosis not present

## 2018-04-13 DIAGNOSIS — Z992 Dependence on renal dialysis: Secondary | ICD-10-CM | POA: Diagnosis not present

## 2018-04-13 DIAGNOSIS — J90 Pleural effusion, not elsewhere classified: Secondary | ICD-10-CM | POA: Diagnosis not present

## 2018-04-13 DIAGNOSIS — T380X5A Adverse effect of glucocorticoids and synthetic analogues, initial encounter: Secondary | ICD-10-CM | POA: Diagnosis not present

## 2018-04-13 DIAGNOSIS — I120 Hypertensive chronic kidney disease with stage 5 chronic kidney disease or end stage renal disease: Secondary | ICD-10-CM | POA: Diagnosis not present

## 2018-04-13 DIAGNOSIS — E1142 Type 2 diabetes mellitus with diabetic polyneuropathy: Secondary | ICD-10-CM | POA: Diagnosis not present

## 2018-04-13 DIAGNOSIS — B965 Pseudomonas (aeruginosa) (mallei) (pseudomallei) as the cause of diseases classified elsewhere: Secondary | ICD-10-CM | POA: Diagnosis not present

## 2018-04-13 DIAGNOSIS — K5900 Constipation, unspecified: Secondary | ICD-10-CM | POA: Diagnosis not present

## 2018-04-13 DIAGNOSIS — B460 Pulmonary mucormycosis: Secondary | ICD-10-CM | POA: Diagnosis not present

## 2018-04-13 DIAGNOSIS — R339 Retention of urine, unspecified: Secondary | ICD-10-CM | POA: Diagnosis not present

## 2018-04-13 DIAGNOSIS — S31809S Unspecified open wound of unspecified buttock, sequela: Secondary | ICD-10-CM | POA: Diagnosis not present

## 2018-04-13 DIAGNOSIS — Z792 Long term (current) use of antibiotics: Secondary | ICD-10-CM | POA: Diagnosis not present

## 2018-04-13 DIAGNOSIS — Z298 Encounter for other specified prophylactic measures: Secondary | ICD-10-CM | POA: Diagnosis not present

## 2018-04-13 DIAGNOSIS — E639 Nutritional deficiency, unspecified: Secondary | ICD-10-CM | POA: Diagnosis not present

## 2018-04-13 DIAGNOSIS — S31809A Unspecified open wound of unspecified buttock, initial encounter: Secondary | ICD-10-CM | POA: Diagnosis not present

## 2018-04-13 DIAGNOSIS — E1165 Type 2 diabetes mellitus with hyperglycemia: Secondary | ICD-10-CM | POA: Diagnosis not present

## 2018-04-13 DIAGNOSIS — N179 Acute kidney failure, unspecified: Secondary | ICD-10-CM | POA: Diagnosis not present

## 2018-04-13 DIAGNOSIS — D649 Anemia, unspecified: Secondary | ICD-10-CM | POA: Diagnosis not present

## 2018-04-13 DIAGNOSIS — B465 Mucormycosis, unspecified: Secondary | ICD-10-CM | POA: Diagnosis not present

## 2018-04-13 DIAGNOSIS — D899 Disorder involving the immune mechanism, unspecified: Secondary | ICD-10-CM | POA: Diagnosis not present

## 2018-04-13 DIAGNOSIS — M6281 Muscle weakness (generalized): Secondary | ICD-10-CM | POA: Diagnosis not present

## 2018-04-13 DIAGNOSIS — E0965 Drug or chemical induced diabetes mellitus with hyperglycemia: Secondary | ICD-10-CM | POA: Diagnosis not present

## 2018-04-13 DIAGNOSIS — Z452 Encounter for adjustment and management of vascular access device: Secondary | ICD-10-CM | POA: Diagnosis not present

## 2018-04-13 DIAGNOSIS — Z940 Kidney transplant status: Secondary | ICD-10-CM | POA: Diagnosis not present

## 2018-04-13 DIAGNOSIS — E119 Type 2 diabetes mellitus without complications: Secondary | ICD-10-CM | POA: Diagnosis not present

## 2018-04-13 DIAGNOSIS — N170 Acute kidney failure with tubular necrosis: Secondary | ICD-10-CM | POA: Diagnosis not present

## 2018-04-13 DIAGNOSIS — Z5181 Encounter for therapeutic drug level monitoring: Secondary | ICD-10-CM | POA: Diagnosis not present

## 2018-04-13 DIAGNOSIS — B259 Cytomegaloviral disease, unspecified: Secondary | ICD-10-CM | POA: Diagnosis not present

## 2018-04-13 DIAGNOSIS — R0602 Shortness of breath: Secondary | ICD-10-CM | POA: Diagnosis not present

## 2018-04-13 DIAGNOSIS — E878 Other disorders of electrolyte and fluid balance, not elsewhere classified: Secondary | ICD-10-CM | POA: Diagnosis not present

## 2018-04-13 DIAGNOSIS — N319 Neuromuscular dysfunction of bladder, unspecified: Secondary | ICD-10-CM | POA: Diagnosis not present

## 2018-04-13 MED ORDER — PREDNISONE 5 MG PO TABS
5.00 | ORAL_TABLET | ORAL | Status: DC
Start: 2018-04-14 — End: 2018-04-13

## 2018-04-13 MED ORDER — RANITIDINE HCL 150 MG PO TABS
150.00 | ORAL_TABLET | ORAL | Status: DC
Start: 2018-04-13 — End: 2018-04-13

## 2018-04-13 MED ORDER — GLUCAGON HCL RDNA (DIAGNOSTIC) 1 MG IJ SOLR
1.00 | INTRAMUSCULAR | Status: DC
Start: ? — End: 2018-04-13

## 2018-04-13 MED ORDER — INSULIN LISPRO 100 UNIT/ML ~~LOC~~ SOLN
18.00 | SUBCUTANEOUS | Status: DC
Start: ? — End: 2018-04-13

## 2018-04-13 MED ORDER — ONDANSETRON HCL 4 MG/2ML IJ SOLN
4.00 | INTRAMUSCULAR | Status: DC
Start: ? — End: 2018-04-13

## 2018-04-13 MED ORDER — INSULIN LISPRO 100 UNIT/ML ~~LOC~~ SOLN
22.00 | SUBCUTANEOUS | Status: DC
Start: 2018-04-14 — End: 2018-04-13

## 2018-04-13 MED ORDER — ASPIRIN EC 81 MG PO TBEC
81.00 | DELAYED_RELEASE_TABLET | ORAL | Status: DC
Start: 2018-04-14 — End: 2018-04-13

## 2018-04-13 MED ORDER — INSULIN LISPRO 100 UNIT/ML ~~LOC~~ SOLN
0.00 | SUBCUTANEOUS | Status: DC
Start: 2018-04-13 — End: 2018-04-13

## 2018-04-13 MED ORDER — SENNOSIDES-DOCUSATE SODIUM 8.6-50 MG PO TABS
2.00 | ORAL_TABLET | ORAL | Status: DC
Start: ? — End: 2018-04-13

## 2018-04-13 MED ORDER — HEPARIN SODIUM (PORCINE) 5000 UNIT/ML IJ SOLN
5000.00 | INTRAMUSCULAR | Status: DC
Start: 2018-04-13 — End: 2018-04-13

## 2018-04-13 MED ORDER — BUTALBITAL-APAP-CAFFEINE 50-325-40 MG PO TABS
1.00 | ORAL_TABLET | ORAL | Status: DC
Start: ? — End: 2018-04-13

## 2018-04-13 MED ORDER — ACETAMINOPHEN 325 MG PO TABS
650.00 | ORAL_TABLET | ORAL | Status: DC
Start: ? — End: 2018-04-13

## 2018-04-13 MED ORDER — SULFAMETHOXAZOLE-TRIMETHOPRIM 800-160 MG PO TABS
1.00 | ORAL_TABLET | ORAL | Status: DC
Start: 2018-04-15 — End: 2018-04-13

## 2018-04-13 MED ORDER — FOLIC ACID 1 MG PO TABS
1.00 | ORAL_TABLET | ORAL | Status: DC
Start: 2018-04-14 — End: 2018-04-13

## 2018-04-13 MED ORDER — TACROLIMUS 0.5 MG PO CAPS
.50 | ORAL_CAPSULE | ORAL | Status: DC
Start: 2018-04-13 — End: 2018-04-13

## 2018-04-13 MED ORDER — GENERIC EXTERNAL MEDICATION
1.00 | Status: DC
Start: 2018-04-14 — End: 2018-04-13

## 2018-04-13 MED ORDER — TORSEMIDE 20 MG PO TABS
20.00 | ORAL_TABLET | ORAL | Status: DC
Start: 2018-04-14 — End: 2018-04-13

## 2018-04-13 MED ORDER — BISACODYL 5 MG PO TBEC
10.00 | DELAYED_RELEASE_TABLET | ORAL | Status: DC
Start: ? — End: 2018-04-13

## 2018-04-13 MED ORDER — MYCOPHENOLATE MOFETIL 250 MG PO CAPS
500.00 | ORAL_CAPSULE | ORAL | Status: DC
Start: 2018-04-13 — End: 2018-04-13

## 2018-04-13 MED ORDER — BENZONATATE 100 MG PO CAPS
100.00 | ORAL_CAPSULE | ORAL | Status: DC
Start: ? — End: 2018-04-13

## 2018-04-13 MED ORDER — INSULIN GLARGINE 100 UNIT/ML ~~LOC~~ SOLN
20.00 | SUBCUTANEOUS | Status: DC
Start: 2018-04-14 — End: 2018-04-13

## 2018-04-13 MED ORDER — DEXTROSE 50 % IV SOLN
12.50 | INTRAVENOUS | Status: DC
Start: ? — End: 2018-04-13

## 2018-04-13 MED ORDER — GABAPENTIN 100 MG PO CAPS
100.00 | ORAL_CAPSULE | ORAL | Status: DC
Start: 2018-04-13 — End: 2018-04-13

## 2018-04-13 MED ORDER — CARVEDILOL 3.125 MG PO TABS
3.13 | ORAL_TABLET | ORAL | Status: DC
Start: 2018-04-13 — End: 2018-04-13

## 2018-04-13 MED ORDER — POSACONAZOLE 100 MG PO TBEC
300.00 | DELAYED_RELEASE_TABLET | ORAL | Status: DC
Start: 2018-04-14 — End: 2018-04-13

## 2018-04-13 MED ORDER — POLYETHYLENE GLYCOL 3350 17 G PO PACK
17.00 | PACK | ORAL | Status: DC
Start: ? — End: 2018-04-13

## 2018-04-13 MED ORDER — LIDOCAINE HCL (PF) 1 % IJ SOLN
INTRAMUSCULAR | Status: DC
Start: ? — End: 2018-04-13

## 2018-04-14 DIAGNOSIS — R5381 Other malaise: Secondary | ICD-10-CM | POA: Diagnosis not present

## 2018-04-15 DIAGNOSIS — R5381 Other malaise: Secondary | ICD-10-CM | POA: Diagnosis not present

## 2018-04-18 DIAGNOSIS — J90 Pleural effusion, not elsewhere classified: Secondary | ICD-10-CM | POA: Diagnosis not present

## 2018-04-18 DIAGNOSIS — Z902 Acquired absence of lung [part of]: Secondary | ICD-10-CM | POA: Diagnosis not present

## 2018-04-18 DIAGNOSIS — R5381 Other malaise: Secondary | ICD-10-CM | POA: Diagnosis not present

## 2018-04-18 DIAGNOSIS — J939 Pneumothorax, unspecified: Secondary | ICD-10-CM | POA: Diagnosis not present

## 2018-04-18 DIAGNOSIS — B465 Mucormycosis, unspecified: Secondary | ICD-10-CM | POA: Diagnosis not present

## 2018-04-18 DIAGNOSIS — R918 Other nonspecific abnormal finding of lung field: Secondary | ICD-10-CM | POA: Diagnosis not present

## 2018-04-18 DIAGNOSIS — B460 Pulmonary mucormycosis: Secondary | ICD-10-CM | POA: Diagnosis not present

## 2018-04-18 DIAGNOSIS — I313 Pericardial effusion (noninflammatory): Secondary | ICD-10-CM | POA: Diagnosis not present

## 2018-04-19 DIAGNOSIS — R5381 Other malaise: Secondary | ICD-10-CM | POA: Diagnosis not present

## 2018-04-20 DIAGNOSIS — N390 Urinary tract infection, site not specified: Secondary | ICD-10-CM | POA: Diagnosis not present

## 2018-04-20 DIAGNOSIS — Z940 Kidney transplant status: Secondary | ICD-10-CM | POA: Diagnosis not present

## 2018-04-20 DIAGNOSIS — R918 Other nonspecific abnormal finding of lung field: Secondary | ICD-10-CM | POA: Diagnosis not present

## 2018-04-20 DIAGNOSIS — B460 Pulmonary mucormycosis: Secondary | ICD-10-CM | POA: Diagnosis not present

## 2018-04-20 DIAGNOSIS — I10 Essential (primary) hypertension: Secondary | ICD-10-CM | POA: Diagnosis not present

## 2018-04-20 DIAGNOSIS — B259 Cytomegaloviral disease, unspecified: Secondary | ICD-10-CM | POA: Diagnosis not present

## 2018-04-20 DIAGNOSIS — N179 Acute kidney failure, unspecified: Secondary | ICD-10-CM | POA: Diagnosis not present

## 2018-04-20 DIAGNOSIS — Z79899 Other long term (current) drug therapy: Secondary | ICD-10-CM | POA: Diagnosis not present

## 2018-04-20 DIAGNOSIS — D899 Disorder involving the immune mechanism, unspecified: Secondary | ICD-10-CM | POA: Diagnosis not present

## 2018-04-20 DIAGNOSIS — Z298 Encounter for other specified prophylactic measures: Secondary | ICD-10-CM | POA: Diagnosis not present

## 2018-04-20 DIAGNOSIS — E119 Type 2 diabetes mellitus without complications: Secondary | ICD-10-CM | POA: Diagnosis not present

## 2018-04-20 DIAGNOSIS — J90 Pleural effusion, not elsewhere classified: Secondary | ICD-10-CM | POA: Diagnosis not present

## 2018-04-20 DIAGNOSIS — Z5181 Encounter for therapeutic drug level monitoring: Secondary | ICD-10-CM | POA: Diagnosis not present

## 2018-04-21 DIAGNOSIS — R5381 Other malaise: Secondary | ICD-10-CM | POA: Diagnosis not present

## 2018-04-22 DIAGNOSIS — R5381 Other malaise: Secondary | ICD-10-CM | POA: Diagnosis not present

## 2018-04-25 DIAGNOSIS — R5381 Other malaise: Secondary | ICD-10-CM | POA: Diagnosis not present

## 2018-04-26 DIAGNOSIS — R5381 Other malaise: Secondary | ICD-10-CM | POA: Diagnosis not present

## 2018-04-27 DIAGNOSIS — R5381 Other malaise: Secondary | ICD-10-CM | POA: Diagnosis not present

## 2018-04-28 DIAGNOSIS — R5381 Other malaise: Secondary | ICD-10-CM | POA: Diagnosis not present

## 2018-04-29 DIAGNOSIS — N186 End stage renal disease: Secondary | ICD-10-CM | POA: Diagnosis not present

## 2018-04-29 DIAGNOSIS — R339 Retention of urine, unspecified: Secondary | ICD-10-CM | POA: Diagnosis not present

## 2018-04-29 DIAGNOSIS — E0965 Drug or chemical induced diabetes mellitus with hyperglycemia: Secondary | ICD-10-CM | POA: Diagnosis not present

## 2018-04-29 DIAGNOSIS — Z794 Long term (current) use of insulin: Secondary | ICD-10-CM | POA: Diagnosis not present

## 2018-04-29 DIAGNOSIS — Z940 Kidney transplant status: Secondary | ICD-10-CM | POA: Diagnosis not present

## 2018-04-29 DIAGNOSIS — E1122 Type 2 diabetes mellitus with diabetic chronic kidney disease: Secondary | ICD-10-CM | POA: Diagnosis not present

## 2018-04-29 DIAGNOSIS — I120 Hypertensive chronic kidney disease with stage 5 chronic kidney disease or end stage renal disease: Secondary | ICD-10-CM | POA: Diagnosis not present

## 2018-04-29 DIAGNOSIS — T380X5A Adverse effect of glucocorticoids and synthetic analogues, initial encounter: Secondary | ICD-10-CM | POA: Diagnosis not present

## 2018-04-29 DIAGNOSIS — E1165 Type 2 diabetes mellitus with hyperglycemia: Secondary | ICD-10-CM | POA: Diagnosis not present

## 2018-04-29 DIAGNOSIS — R5381 Other malaise: Secondary | ICD-10-CM | POA: Diagnosis not present

## 2018-04-29 DIAGNOSIS — Z452 Encounter for adjustment and management of vascular access device: Secondary | ICD-10-CM | POA: Diagnosis not present

## 2018-04-29 DIAGNOSIS — N319 Neuromuscular dysfunction of bladder, unspecified: Secondary | ICD-10-CM | POA: Diagnosis not present

## 2018-05-02 DIAGNOSIS — R5381 Other malaise: Secondary | ICD-10-CM | POA: Diagnosis not present

## 2018-05-03 DIAGNOSIS — Z792 Long term (current) use of antibiotics: Secondary | ICD-10-CM | POA: Diagnosis not present

## 2018-05-03 DIAGNOSIS — R5381 Other malaise: Secondary | ICD-10-CM | POA: Diagnosis not present

## 2018-05-03 DIAGNOSIS — Z940 Kidney transplant status: Secondary | ICD-10-CM | POA: Diagnosis not present

## 2018-05-03 DIAGNOSIS — T8619 Other complication of kidney transplant: Secondary | ICD-10-CM | POA: Diagnosis not present

## 2018-05-04 DIAGNOSIS — R5381 Other malaise: Secondary | ICD-10-CM | POA: Diagnosis not present

## 2018-05-05 DIAGNOSIS — R5381 Other malaise: Secondary | ICD-10-CM | POA: Diagnosis not present

## 2018-05-06 DIAGNOSIS — R5381 Other malaise: Secondary | ICD-10-CM | POA: Diagnosis not present

## 2018-05-09 DIAGNOSIS — R5381 Other malaise: Secondary | ICD-10-CM | POA: Diagnosis not present

## 2018-05-10 DIAGNOSIS — R5381 Other malaise: Secondary | ICD-10-CM | POA: Diagnosis not present

## 2018-05-11 DIAGNOSIS — B965 Pseudomonas (aeruginosa) (mallei) (pseudomallei) as the cause of diseases classified elsewhere: Secondary | ICD-10-CM | POA: Diagnosis not present

## 2018-05-11 DIAGNOSIS — N186 End stage renal disease: Secondary | ICD-10-CM | POA: Diagnosis not present

## 2018-05-11 DIAGNOSIS — B460 Pulmonary mucormycosis: Secondary | ICD-10-CM | POA: Diagnosis not present

## 2018-05-11 DIAGNOSIS — I313 Pericardial effusion (noninflammatory): Secondary | ICD-10-CM | POA: Diagnosis not present

## 2018-05-11 DIAGNOSIS — Z5181 Encounter for therapeutic drug level monitoring: Secondary | ICD-10-CM | POA: Diagnosis not present

## 2018-05-11 DIAGNOSIS — B259 Cytomegaloviral disease, unspecified: Secondary | ICD-10-CM | POA: Diagnosis not present

## 2018-05-11 DIAGNOSIS — N170 Acute kidney failure with tubular necrosis: Secondary | ICD-10-CM | POA: Diagnosis not present

## 2018-05-11 DIAGNOSIS — N990 Postprocedural (acute) (chronic) kidney failure: Secondary | ICD-10-CM | POA: Diagnosis not present

## 2018-05-11 DIAGNOSIS — N390 Urinary tract infection, site not specified: Secondary | ICD-10-CM | POA: Diagnosis not present

## 2018-05-11 DIAGNOSIS — D899 Disorder involving the immune mechanism, unspecified: Secondary | ICD-10-CM | POA: Diagnosis not present

## 2018-05-11 DIAGNOSIS — Z940 Kidney transplant status: Secondary | ICD-10-CM | POA: Diagnosis not present

## 2018-05-12 DIAGNOSIS — R5381 Other malaise: Secondary | ICD-10-CM | POA: Diagnosis not present

## 2018-05-13 DIAGNOSIS — R5381 Other malaise: Secondary | ICD-10-CM | POA: Diagnosis not present

## 2018-05-16 DIAGNOSIS — R5381 Other malaise: Secondary | ICD-10-CM | POA: Diagnosis not present

## 2018-05-18 DIAGNOSIS — R5381 Other malaise: Secondary | ICD-10-CM | POA: Diagnosis not present

## 2018-05-20 ENCOUNTER — Telehealth: Payer: Self-pay | Admitting: Family Medicine

## 2018-05-20 NOTE — Telephone Encounter (Signed)
Verbal orders given to [facility] home care.

## 2018-05-20 NOTE — Telephone Encounter (Signed)
Kendred at [facility] to start PT, Nursing and Occ therapy

## 2018-05-20 NOTE — Telephone Encounter (Signed)
It would be fine to go ahead and give this

## 2018-05-23 ENCOUNTER — Encounter: Payer: Self-pay | Admitting: Family Medicine

## 2018-05-23 ENCOUNTER — Ambulatory Visit (INDEPENDENT_AMBULATORY_CARE_PROVIDER_SITE_OTHER): Payer: Medicare Other | Admitting: Family Medicine

## 2018-05-23 VITALS — BP 130/88 | Wt 177.8 lb

## 2018-05-23 DIAGNOSIS — F321 Major depressive disorder, single episode, moderate: Secondary | ICD-10-CM | POA: Diagnosis not present

## 2018-05-23 NOTE — Progress Notes (Signed)
   Subjective:    Patient ID: Shelley [name], female    DOB: [birth year], 68 y.o.   MRN: [id number]  HPI Pt here today for hospital follow up. Pt was in [place] for almost 3 months. 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 Pt states her shortness of breath has become better.   Patient was last seen here several months ago at that time she had a cavitary lesion in her chest and was sent to [place] they did multiple scans treated her with medications and ended up having to remove part of her lung in order to keep her situation stable then she went into 5 weeks of rehab at [facility] at an assisted living facility close by they have been monitoring her lab work she sees endocrinology and transplant doctor and pulmonary at [facility] recently they started her on sertraline   Patient relates that she feels down depressed sad but denies being suicidal.  Lack of energy lack of appetite poor sleeping.  Has seen some improvement with the sertraline. Review of Systems  Constitutional: Negative for activity change, appetite change and fatigue.  HENT: Negative for congestion and rhinorrhea.   Respiratory: Negative for cough and shortness of breath.   Cardiovascular: Negative for chest pain and leg swelling.  Gastrointestinal: Negative for abdominal pain and diarrhea.  Endocrine: Negative for polydipsia and polyphagia.  Skin: Negative for color change.  Neurological: Negative for dizziness and weakness.  Psychiatric/Behavioral: Negative for behavioral problems and confusion.       Objective:   Physical Exam Vitals signs reviewed.  Constitutional:      General: She is not in acute distress. HENT:     Head: Normocephalic and atraumatic.  Eyes:     General:        Right eye: No discharge.        Left eye: No discharge.  Neck:     Trachea: No tracheal deviation.  Cardiovascular:     Rate and Rhythm: Normal rate and regular rhythm.     Heart sounds: Normal heart  sounds. No murmur.  Pulmonary:     Effort: Pulmonary effort is normal. No respiratory distress.     Breath sounds: Normal breath sounds.  Lymphadenopathy:     Cervical: No cervical adenopathy.  Skin:    General: Skin is warm and dry.  Neurological:     Mental Status: She is alert.     Coordination: Coordination normal.  Psychiatric:        Behavior: Behavior normal.           Assessment & Plan:  Depression-helping some with the sertraline patient would likely need increased dose if not seeing improvement over the next few weeks recheck in 3 to 4 weeks if patient is unable to come in because of her transplant situation she can send [place] an update or call us  Transplant will be followed by Jar[facility] S[facility] She will also see pulmonary and endocrinology on a regular basis Patient will follow-up here if any other problems otherwise recheck 3 weeks

## 2018-05-24 DIAGNOSIS — Z5181 Encounter for therapeutic drug level monitoring: Secondary | ICD-10-CM | POA: Diagnosis not present

## 2018-05-24 DIAGNOSIS — D899 Disorder involving the immune mechanism, unspecified: Secondary | ICD-10-CM | POA: Diagnosis not present

## 2018-05-24 DIAGNOSIS — N390 Urinary tract infection, site not specified: Secondary | ICD-10-CM | POA: Diagnosis not present

## 2018-05-24 DIAGNOSIS — Z940 Kidney transplant status: Secondary | ICD-10-CM | POA: Diagnosis not present

## 2018-05-25 DIAGNOSIS — Z5181 Encounter for therapeutic drug level monitoring: Secondary | ICD-10-CM | POA: Diagnosis not present

## 2018-05-25 DIAGNOSIS — Z940 Kidney transplant status: Secondary | ICD-10-CM | POA: Diagnosis not present

## 2018-05-27 DIAGNOSIS — Z940 Kidney transplant status: Secondary | ICD-10-CM | POA: Diagnosis not present

## 2018-05-27 DIAGNOSIS — Z5181 Encounter for therapeutic drug level monitoring: Secondary | ICD-10-CM | POA: Diagnosis not present

## 2018-05-31 ENCOUNTER — Telehealth: Payer: Self-pay | Admitting: Family Medicine

## 2018-05-31 NOTE — Telephone Encounter (Signed)
Informational only: [facility] states patient declined nursing services, but will start physical therapy today.

## 2018-05-31 NOTE — Telephone Encounter (Signed)
FYI

## 2018-05-31 NOTE — Telephone Encounter (Signed)
Noted  

## 2018-06-02 ENCOUNTER — Telehealth: Payer: Self-pay | Admitting: *Deleted

## 2018-06-02 NOTE — Telephone Encounter (Signed)
Joey pt has been trying to see pt since last week and pt was not feeling well today when he saw her. He did not do PT today. She was wobbly  and dizzy bp on low side. Seeing kidney doctor at [facility] tomorrow. Asked if she wanted to see dr scott or ER today and pt declined. His suggestion is to get nursing services next week.   [phone] to request nursing services.

## 2018-06-02 NOTE — Telephone Encounter (Signed)
It would be fine for nursing services to see her next week She will see [facility] doctor tomorrow they will help address this issue further

## 2018-06-02 NOTE — Telephone Encounter (Signed)
Verbal order for nursing services given to [facility]

## 2018-06-03 DIAGNOSIS — R8281 Pyuria: Secondary | ICD-10-CM | POA: Diagnosis not present

## 2018-06-03 DIAGNOSIS — J90 Pleural effusion, not elsewhere classified: Secondary | ICD-10-CM | POA: Diagnosis not present

## 2018-06-03 DIAGNOSIS — Z792 Long term (current) use of antibiotics: Secondary | ICD-10-CM | POA: Diagnosis not present

## 2018-06-03 DIAGNOSIS — E1165 Type 2 diabetes mellitus with hyperglycemia: Secondary | ICD-10-CM | POA: Diagnosis not present

## 2018-06-03 DIAGNOSIS — Z79899 Other long term (current) drug therapy: Secondary | ICD-10-CM | POA: Diagnosis not present

## 2018-06-03 DIAGNOSIS — D899 Disorder involving the immune mechanism, unspecified: Secondary | ICD-10-CM | POA: Diagnosis not present

## 2018-06-03 DIAGNOSIS — J811 Chronic pulmonary edema: Secondary | ICD-10-CM | POA: Diagnosis not present

## 2018-06-03 DIAGNOSIS — I10 Essential (primary) hypertension: Secondary | ICD-10-CM | POA: Diagnosis not present

## 2018-06-03 DIAGNOSIS — Z794 Long term (current) use of insulin: Secondary | ICD-10-CM | POA: Diagnosis not present

## 2018-06-03 DIAGNOSIS — Z7982 Long term (current) use of aspirin: Secondary | ICD-10-CM | POA: Diagnosis not present

## 2018-06-03 DIAGNOSIS — T380X5A Adverse effect of glucocorticoids and synthetic analogues, initial encounter: Secondary | ICD-10-CM | POA: Diagnosis not present

## 2018-06-03 DIAGNOSIS — T8619 Other complication of kidney transplant: Secondary | ICD-10-CM | POA: Diagnosis not present

## 2018-06-03 DIAGNOSIS — E1122 Type 2 diabetes mellitus with diabetic chronic kidney disease: Secondary | ICD-10-CM | POA: Diagnosis not present

## 2018-06-03 DIAGNOSIS — D649 Anemia, unspecified: Secondary | ICD-10-CM | POA: Diagnosis present

## 2018-06-03 DIAGNOSIS — E782 Mixed hyperlipidemia: Secondary | ICD-10-CM | POA: Diagnosis not present

## 2018-06-03 DIAGNOSIS — E1140 Type 2 diabetes mellitus with diabetic neuropathy, unspecified: Secondary | ICD-10-CM | POA: Diagnosis present

## 2018-06-03 DIAGNOSIS — N179 Acute kidney failure, unspecified: Secondary | ICD-10-CM | POA: Diagnosis not present

## 2018-06-03 DIAGNOSIS — J984 Other disorders of lung: Secondary | ICD-10-CM | POA: Diagnosis present

## 2018-06-03 DIAGNOSIS — Z940 Kidney transplant status: Secondary | ICD-10-CM | POA: Diagnosis not present

## 2018-06-03 DIAGNOSIS — E118 Type 2 diabetes mellitus with unspecified complications: Secondary | ICD-10-CM | POA: Diagnosis not present

## 2018-06-03 DIAGNOSIS — B465 Mucormycosis, unspecified: Secondary | ICD-10-CM | POA: Diagnosis not present

## 2018-06-03 DIAGNOSIS — I959 Hypotension, unspecified: Secondary | ICD-10-CM | POA: Diagnosis not present

## 2018-06-03 DIAGNOSIS — R7989 Other specified abnormal findings of blood chemistry: Secondary | ICD-10-CM | POA: Diagnosis not present

## 2018-06-05 MED ORDER — INSULIN GLARGINE 100 UNIT/ML ~~LOC~~ SOLN
15.00 | SUBCUTANEOUS | Status: DC
Start: 2018-06-05 — End: 2018-06-05

## 2018-06-05 MED ORDER — DEXTROSE 50 % IV SOLN
12.50 | INTRAVENOUS | Status: DC
Start: ? — End: 2018-06-05

## 2018-06-05 MED ORDER — SENNOSIDES-DOCUSATE SODIUM 8.6-50 MG PO TABS
2.00 | ORAL_TABLET | ORAL | Status: DC
Start: ? — End: 2018-06-05

## 2018-06-05 MED ORDER — MYCOPHENOLATE MOFETIL 250 MG PO CAPS
500.00 | ORAL_CAPSULE | ORAL | Status: DC
Start: 2018-06-05 — End: 2018-06-05

## 2018-06-05 MED ORDER — SERTRALINE HCL 50 MG PO TABS
50.00 | ORAL_TABLET | ORAL | Status: DC
Start: 2018-06-06 — End: 2018-06-05

## 2018-06-05 MED ORDER — SULFAMETHOXAZOLE-TRIMETHOPRIM 800-160 MG PO TABS
1.00 | ORAL_TABLET | ORAL | Status: DC
Start: ? — End: 2018-06-05

## 2018-06-05 MED ORDER — TORSEMIDE 20 MG PO TABS
20.00 | ORAL_TABLET | ORAL | Status: DC
Start: 2018-06-07 — End: 2018-06-05

## 2018-06-05 MED ORDER — POSACONAZOLE 100 MG PO TBEC
300.00 | DELAYED_RELEASE_TABLET | ORAL | Status: DC
Start: 2018-06-06 — End: 2018-06-05

## 2018-06-05 MED ORDER — RANITIDINE HCL 150 MG PO TABS
150.00 | ORAL_TABLET | ORAL | Status: DC
Start: 2018-06-05 — End: 2018-06-05

## 2018-06-05 MED ORDER — INSULIN LISPRO 100 UNIT/ML ~~LOC~~ SOLN
0.00 | SUBCUTANEOUS | Status: DC
Start: 2018-06-05 — End: 2018-06-05

## 2018-06-05 MED ORDER — ONDANSETRON HCL 4 MG/2ML IJ SOLN
4.00 | INTRAMUSCULAR | Status: DC
Start: ? — End: 2018-06-05

## 2018-06-05 MED ORDER — GLUCAGON HCL RDNA (DIAGNOSTIC) 1 MG IJ SOLR
1.00 | INTRAMUSCULAR | Status: DC
Start: ? — End: 2018-06-05

## 2018-06-05 MED ORDER — GABAPENTIN 100 MG PO CAPS
100.00 | ORAL_CAPSULE | ORAL | Status: DC
Start: 2018-06-05 — End: 2018-06-05

## 2018-06-05 MED ORDER — ASPIRIN EC 81 MG PO TBEC
81.00 | DELAYED_RELEASE_TABLET | ORAL | Status: DC
Start: 2018-06-06 — End: 2018-06-05

## 2018-06-05 MED ORDER — PREDNISONE 5 MG PO TABS
5.00 | ORAL_TABLET | ORAL | Status: DC
Start: 2018-06-06 — End: 2018-06-05

## 2018-06-05 MED ORDER — TACROLIMUS 0.5 MG PO CAPS
.50 | ORAL_CAPSULE | ORAL | Status: DC
Start: 2018-06-06 — End: 2018-06-05

## 2018-06-05 MED ORDER — VALGANCICLOVIR HCL 450 MG PO TABS
450.00 | ORAL_TABLET | ORAL | Status: DC
Start: 2018-06-06 — End: 2018-06-05

## 2018-06-06 DIAGNOSIS — D631 Anemia in chronic kidney disease: Secondary | ICD-10-CM | POA: Diagnosis not present

## 2018-06-06 DIAGNOSIS — Z87440 Personal history of urinary (tract) infections: Secondary | ICD-10-CM | POA: Diagnosis not present

## 2018-06-06 DIAGNOSIS — J852 Abscess of lung without pneumonia: Secondary | ICD-10-CM | POA: Diagnosis not present

## 2018-06-06 DIAGNOSIS — K5900 Constipation, unspecified: Secondary | ICD-10-CM | POA: Diagnosis not present

## 2018-06-06 DIAGNOSIS — F4321 Adjustment disorder with depressed mood: Secondary | ICD-10-CM | POA: Diagnosis not present

## 2018-06-06 DIAGNOSIS — E1122 Type 2 diabetes mellitus with diabetic chronic kidney disease: Secondary | ICD-10-CM | POA: Diagnosis not present

## 2018-06-06 DIAGNOSIS — D899 Disorder involving the immune mechanism, unspecified: Secondary | ICD-10-CM | POA: Diagnosis not present

## 2018-06-06 DIAGNOSIS — I129 Hypertensive chronic kidney disease with stage 1 through stage 4 chronic kidney disease, or unspecified chronic kidney disease: Secondary | ICD-10-CM | POA: Diagnosis not present

## 2018-06-06 DIAGNOSIS — E639 Nutritional deficiency, unspecified: Secondary | ICD-10-CM | POA: Diagnosis not present

## 2018-06-06 DIAGNOSIS — E785 Hyperlipidemia, unspecified: Secondary | ICD-10-CM | POA: Diagnosis not present

## 2018-06-06 DIAGNOSIS — Z9181 History of falling: Secondary | ICD-10-CM | POA: Diagnosis not present

## 2018-06-06 DIAGNOSIS — Z794 Long term (current) use of insulin: Secondary | ICD-10-CM | POA: Diagnosis not present

## 2018-06-06 DIAGNOSIS — Z940 Kidney transplant status: Secondary | ICD-10-CM | POA: Diagnosis not present

## 2018-06-06 DIAGNOSIS — Z902 Acquired absence of lung [part of]: Secondary | ICD-10-CM | POA: Diagnosis not present

## 2018-06-06 DIAGNOSIS — N189 Chronic kidney disease, unspecified: Secondary | ICD-10-CM | POA: Diagnosis not present

## 2018-06-06 DIAGNOSIS — Z7982 Long term (current) use of aspirin: Secondary | ICD-10-CM | POA: Diagnosis not present

## 2018-06-08 ENCOUNTER — Telehealth: Payer: Self-pay | Admitting: Family Medicine

## 2018-06-08 DIAGNOSIS — I313 Pericardial effusion (noninflammatory): Secondary | ICD-10-CM | POA: Diagnosis not present

## 2018-06-08 DIAGNOSIS — Z79899 Other long term (current) drug therapy: Secondary | ICD-10-CM | POA: Diagnosis not present

## 2018-06-08 DIAGNOSIS — Z940 Kidney transplant status: Secondary | ICD-10-CM | POA: Diagnosis not present

## 2018-06-08 DIAGNOSIS — Z452 Encounter for adjustment and management of vascular access device: Secondary | ICD-10-CM | POA: Diagnosis not present

## 2018-06-08 DIAGNOSIS — I071 Rheumatic tricuspid insufficiency: Secondary | ICD-10-CM | POA: Diagnosis not present

## 2018-06-08 DIAGNOSIS — Z5181 Encounter for therapeutic drug level monitoring: Secondary | ICD-10-CM | POA: Diagnosis not present

## 2018-06-08 DIAGNOSIS — I371 Nonrheumatic pulmonary valve insufficiency: Secondary | ICD-10-CM | POA: Diagnosis not present

## 2018-06-08 DIAGNOSIS — B465 Mucormycosis, unspecified: Secondary | ICD-10-CM | POA: Diagnosis not present

## 2018-06-08 DIAGNOSIS — A498 Other bacterial infections of unspecified site: Secondary | ICD-10-CM | POA: Diagnosis not present

## 2018-06-08 NOTE — Telephone Encounter (Signed)
It would be fine to go ahead and do this

## 2018-06-08 NOTE — Telephone Encounter (Signed)
Please advise. Thank you

## 2018-06-08 NOTE — Telephone Encounter (Signed)
Verbal orders left on Shelley [name]'s voicemail

## 2018-06-08 NOTE — Telephone Encounter (Signed)
Requesting verbal order Occupational therapy to eval and treat  1 week 4 then reassess

## 2018-06-13 DIAGNOSIS — B349 Viral infection, unspecified: Secondary | ICD-10-CM | POA: Diagnosis not present

## 2018-06-13 DIAGNOSIS — Z5181 Encounter for therapeutic drug level monitoring: Secondary | ICD-10-CM | POA: Diagnosis not present

## 2018-06-13 DIAGNOSIS — B259 Cytomegaloviral disease, unspecified: Secondary | ICD-10-CM | POA: Diagnosis not present

## 2018-06-15 DIAGNOSIS — D631 Anemia in chronic kidney disease: Secondary | ICD-10-CM | POA: Diagnosis not present

## 2018-06-15 DIAGNOSIS — I129 Hypertensive chronic kidney disease with stage 1 through stage 4 chronic kidney disease, or unspecified chronic kidney disease: Secondary | ICD-10-CM | POA: Diagnosis not present

## 2018-06-15 DIAGNOSIS — F4321 Adjustment disorder with depressed mood: Secondary | ICD-10-CM | POA: Diagnosis not present

## 2018-06-15 DIAGNOSIS — J852 Abscess of lung without pneumonia: Secondary | ICD-10-CM | POA: Diagnosis not present

## 2018-06-15 DIAGNOSIS — N189 Chronic kidney disease, unspecified: Secondary | ICD-10-CM | POA: Diagnosis not present

## 2018-06-15 DIAGNOSIS — E1122 Type 2 diabetes mellitus with diabetic chronic kidney disease: Secondary | ICD-10-CM | POA: Diagnosis not present

## 2018-06-20 DIAGNOSIS — Z5181 Encounter for therapeutic drug level monitoring: Secondary | ICD-10-CM | POA: Diagnosis not present

## 2018-06-20 DIAGNOSIS — Z940 Kidney transplant status: Secondary | ICD-10-CM | POA: Diagnosis not present

## 2018-06-21 ENCOUNTER — Other Ambulatory Visit: Payer: Self-pay

## 2018-06-21 ENCOUNTER — Ambulatory Visit (INDEPENDENT_AMBULATORY_CARE_PROVIDER_SITE_OTHER): Payer: Medicare Other | Admitting: Family Medicine

## 2018-06-21 VITALS — BP 124/82 | Ht 66.0 in | Wt 163.2 lb

## 2018-06-21 DIAGNOSIS — J3089 Other allergic rhinitis: Secondary | ICD-10-CM | POA: Diagnosis not present

## 2018-06-21 DIAGNOSIS — R5383 Other fatigue: Secondary | ICD-10-CM

## 2018-06-21 DIAGNOSIS — F321 Major depressive disorder, single episode, moderate: Secondary | ICD-10-CM

## 2018-06-21 NOTE — Progress Notes (Signed)
   Subjective:    Patient ID: Shelley [name], female    DOB: May 21, 1950, 68 y.o.   MRN: [id number]  HPIFollow up on depression. Pt states she was told at last visit that a depression med may be added but she does not feel she needs anything at this time. She states she is doing fine.   Slight headache and runny nose - clear. started about one week ago. Head hurts when she moves but ok when standing still it does not hurt. Headache across forehead. Took otc generic allergy med yesterday and headache went away. Wants to know if she can continue the allergy med.   Virtual Visit via Telephone Note  I connected with Shelley [name] on 06/21/18 at  9:30 AM EDT by telephone and verified that I am speaking with the correct person using two identifiers.   I discussed the limitations, risks, security and privacy concerns of performing an evaluation and management service by telephone and the availability of in person appointments. I also discussed with the patient that there may be a patient responsible charge related to this service. The patient expressed understanding and agreed to proceed.  It does seem that her runny nose and slight head congestion was allergy related she is not running any fever there is no purulent discharge I would not recommend any type of antibiotics currently I recommend OTC allergy medicines  She is scheduled to see her [facility] nephrologist next month but I told her to be very wary of going any major travel during this peak of the outbreak.  She should talk with their office to see if they be willing to do a tele-visit and then reschedule to a later time interest in sounds good thank you History of Present Illness:    Observations/Objective: Not able to do PE because of televisit  Assessment and Plan: Depression-better continue sertraline 50 mg daily  Allergy-otc prn for allergy  Fatigue- healthy eating and activity as tolerated  Follow Up Instructions:    I discussed the  assessment and treatment plan with the patient. The patient was provided an opportunity to ask questions and all were answered. The patient agreed with the plan and demonstrated an understanding of the instructions.   The patient was advised to call back or seek an in-person evaluation if the symptoms worsen or if the condition fails to improve as anticipated.  I provided 17 minutes of non-face-to-face time during this encounter.  Patient follows with diabetic doc via online [place] Bailiff, LPN  Review of Systems     Objective:   Physical Exam        Assessment & Plan:

## 2018-06-22 DIAGNOSIS — E1122 Type 2 diabetes mellitus with diabetic chronic kidney disease: Secondary | ICD-10-CM | POA: Diagnosis not present

## 2018-06-22 DIAGNOSIS — F4321 Adjustment disorder with depressed mood: Secondary | ICD-10-CM | POA: Diagnosis not present

## 2018-06-22 DIAGNOSIS — N189 Chronic kidney disease, unspecified: Secondary | ICD-10-CM | POA: Diagnosis not present

## 2018-06-22 DIAGNOSIS — J852 Abscess of lung without pneumonia: Secondary | ICD-10-CM | POA: Diagnosis not present

## 2018-06-22 DIAGNOSIS — D631 Anemia in chronic kidney disease: Secondary | ICD-10-CM | POA: Diagnosis not present

## 2018-06-22 DIAGNOSIS — I129 Hypertensive chronic kidney disease with stage 1 through stage 4 chronic kidney disease, or unspecified chronic kidney disease: Secondary | ICD-10-CM | POA: Diagnosis not present

## 2018-06-24 DIAGNOSIS — N189 Chronic kidney disease, unspecified: Secondary | ICD-10-CM | POA: Diagnosis not present

## 2018-06-24 DIAGNOSIS — I129 Hypertensive chronic kidney disease with stage 1 through stage 4 chronic kidney disease, or unspecified chronic kidney disease: Secondary | ICD-10-CM | POA: Diagnosis not present

## 2018-06-24 DIAGNOSIS — J852 Abscess of lung without pneumonia: Secondary | ICD-10-CM | POA: Diagnosis not present

## 2018-06-24 DIAGNOSIS — E1122 Type 2 diabetes mellitus with diabetic chronic kidney disease: Secondary | ICD-10-CM | POA: Diagnosis not present

## 2018-06-28 DIAGNOSIS — F4321 Adjustment disorder with depressed mood: Secondary | ICD-10-CM | POA: Diagnosis not present

## 2018-06-28 DIAGNOSIS — E1122 Type 2 diabetes mellitus with diabetic chronic kidney disease: Secondary | ICD-10-CM | POA: Diagnosis not present

## 2018-06-28 DIAGNOSIS — J852 Abscess of lung without pneumonia: Secondary | ICD-10-CM | POA: Diagnosis not present

## 2018-06-28 DIAGNOSIS — D631 Anemia in chronic kidney disease: Secondary | ICD-10-CM | POA: Diagnosis not present

## 2018-06-28 DIAGNOSIS — I129 Hypertensive chronic kidney disease with stage 1 through stage 4 chronic kidney disease, or unspecified chronic kidney disease: Secondary | ICD-10-CM | POA: Diagnosis not present

## 2018-06-28 DIAGNOSIS — N189 Chronic kidney disease, unspecified: Secondary | ICD-10-CM | POA: Diagnosis not present

## 2018-06-29 DIAGNOSIS — Z940 Kidney transplant status: Secondary | ICD-10-CM | POA: Diagnosis not present

## 2018-07-05 DIAGNOSIS — N189 Chronic kidney disease, unspecified: Secondary | ICD-10-CM | POA: Diagnosis not present

## 2018-07-05 DIAGNOSIS — D631 Anemia in chronic kidney disease: Secondary | ICD-10-CM | POA: Diagnosis not present

## 2018-07-05 DIAGNOSIS — J852 Abscess of lung without pneumonia: Secondary | ICD-10-CM | POA: Diagnosis not present

## 2018-07-05 DIAGNOSIS — F4321 Adjustment disorder with depressed mood: Secondary | ICD-10-CM | POA: Diagnosis not present

## 2018-07-05 DIAGNOSIS — I129 Hypertensive chronic kidney disease with stage 1 through stage 4 chronic kidney disease, or unspecified chronic kidney disease: Secondary | ICD-10-CM | POA: Diagnosis not present

## 2018-07-05 DIAGNOSIS — E1122 Type 2 diabetes mellitus with diabetic chronic kidney disease: Secondary | ICD-10-CM | POA: Diagnosis not present

## 2018-07-06 DIAGNOSIS — Z902 Acquired absence of lung [part of]: Secondary | ICD-10-CM | POA: Diagnosis not present

## 2018-07-06 DIAGNOSIS — Z940 Kidney transplant status: Secondary | ICD-10-CM | POA: Diagnosis not present

## 2018-07-06 DIAGNOSIS — J852 Abscess of lung without pneumonia: Secondary | ICD-10-CM | POA: Diagnosis not present

## 2018-07-06 DIAGNOSIS — I129 Hypertensive chronic kidney disease with stage 1 through stage 4 chronic kidney disease, or unspecified chronic kidney disease: Secondary | ICD-10-CM | POA: Diagnosis not present

## 2018-07-06 DIAGNOSIS — E1122 Type 2 diabetes mellitus with diabetic chronic kidney disease: Secondary | ICD-10-CM | POA: Diagnosis not present

## 2018-07-06 DIAGNOSIS — E785 Hyperlipidemia, unspecified: Secondary | ICD-10-CM | POA: Diagnosis not present

## 2018-07-06 DIAGNOSIS — Z87440 Personal history of urinary (tract) infections: Secondary | ICD-10-CM | POA: Diagnosis not present

## 2018-07-06 DIAGNOSIS — D631 Anemia in chronic kidney disease: Secondary | ICD-10-CM | POA: Diagnosis not present

## 2018-07-06 DIAGNOSIS — N189 Chronic kidney disease, unspecified: Secondary | ICD-10-CM | POA: Diagnosis not present

## 2018-07-06 DIAGNOSIS — Z794 Long term (current) use of insulin: Secondary | ICD-10-CM | POA: Diagnosis not present

## 2018-07-06 DIAGNOSIS — E639 Nutritional deficiency, unspecified: Secondary | ICD-10-CM | POA: Diagnosis not present

## 2018-07-06 DIAGNOSIS — F4321 Adjustment disorder with depressed mood: Secondary | ICD-10-CM | POA: Diagnosis not present

## 2018-07-06 DIAGNOSIS — D899 Disorder involving the immune mechanism, unspecified: Secondary | ICD-10-CM | POA: Diagnosis not present

## 2018-07-06 DIAGNOSIS — Z9181 History of falling: Secondary | ICD-10-CM | POA: Diagnosis not present

## 2018-07-06 DIAGNOSIS — K5900 Constipation, unspecified: Secondary | ICD-10-CM | POA: Diagnosis not present

## 2018-07-06 DIAGNOSIS — Z7982 Long term (current) use of aspirin: Secondary | ICD-10-CM | POA: Diagnosis not present

## 2018-07-11 DIAGNOSIS — Z940 Kidney transplant status: Secondary | ICD-10-CM | POA: Diagnosis not present

## 2018-07-13 DIAGNOSIS — I129 Hypertensive chronic kidney disease with stage 1 through stage 4 chronic kidney disease, or unspecified chronic kidney disease: Secondary | ICD-10-CM | POA: Diagnosis not present

## 2018-07-13 DIAGNOSIS — J852 Abscess of lung without pneumonia: Secondary | ICD-10-CM | POA: Diagnosis not present

## 2018-07-13 DIAGNOSIS — D631 Anemia in chronic kidney disease: Secondary | ICD-10-CM | POA: Diagnosis not present

## 2018-07-13 DIAGNOSIS — N189 Chronic kidney disease, unspecified: Secondary | ICD-10-CM | POA: Diagnosis not present

## 2018-07-13 DIAGNOSIS — E1122 Type 2 diabetes mellitus with diabetic chronic kidney disease: Secondary | ICD-10-CM | POA: Diagnosis not present

## 2018-07-13 DIAGNOSIS — F4321 Adjustment disorder with depressed mood: Secondary | ICD-10-CM | POA: Diagnosis not present

## 2018-07-18 DIAGNOSIS — I10 Essential (primary) hypertension: Secondary | ICD-10-CM | POA: Diagnosis not present

## 2018-07-18 DIAGNOSIS — Z940 Kidney transplant status: Secondary | ICD-10-CM | POA: Diagnosis not present

## 2018-07-18 DIAGNOSIS — Z5181 Encounter for therapeutic drug level monitoring: Secondary | ICD-10-CM | POA: Diagnosis not present

## 2018-07-19 DIAGNOSIS — F4321 Adjustment disorder with depressed mood: Secondary | ICD-10-CM | POA: Diagnosis not present

## 2018-07-19 DIAGNOSIS — D631 Anemia in chronic kidney disease: Secondary | ICD-10-CM | POA: Diagnosis not present

## 2018-07-19 DIAGNOSIS — E1122 Type 2 diabetes mellitus with diabetic chronic kidney disease: Secondary | ICD-10-CM | POA: Diagnosis not present

## 2018-07-19 DIAGNOSIS — I129 Hypertensive chronic kidney disease with stage 1 through stage 4 chronic kidney disease, or unspecified chronic kidney disease: Secondary | ICD-10-CM | POA: Diagnosis not present

## 2018-07-19 DIAGNOSIS — N189 Chronic kidney disease, unspecified: Secondary | ICD-10-CM | POA: Diagnosis not present

## 2018-07-19 DIAGNOSIS — J852 Abscess of lung without pneumonia: Secondary | ICD-10-CM | POA: Diagnosis not present

## 2018-07-28 DIAGNOSIS — F4321 Adjustment disorder with depressed mood: Secondary | ICD-10-CM | POA: Diagnosis not present

## 2018-07-28 DIAGNOSIS — I129 Hypertensive chronic kidney disease with stage 1 through stage 4 chronic kidney disease, or unspecified chronic kidney disease: Secondary | ICD-10-CM | POA: Diagnosis not present

## 2018-07-28 DIAGNOSIS — E1122 Type 2 diabetes mellitus with diabetic chronic kidney disease: Secondary | ICD-10-CM | POA: Diagnosis not present

## 2018-07-28 DIAGNOSIS — D631 Anemia in chronic kidney disease: Secondary | ICD-10-CM | POA: Diagnosis not present

## 2018-07-28 DIAGNOSIS — N189 Chronic kidney disease, unspecified: Secondary | ICD-10-CM | POA: Diagnosis not present

## 2018-07-28 DIAGNOSIS — J852 Abscess of lung without pneumonia: Secondary | ICD-10-CM | POA: Diagnosis not present

## 2018-08-01 DIAGNOSIS — Z940 Kidney transplant status: Secondary | ICD-10-CM | POA: Diagnosis not present

## 2018-08-03 DIAGNOSIS — R918 Other nonspecific abnormal finding of lung field: Secondary | ICD-10-CM | POA: Diagnosis not present

## 2018-08-03 DIAGNOSIS — I313 Pericardial effusion (noninflammatory): Secondary | ICD-10-CM | POA: Diagnosis not present

## 2018-08-03 DIAGNOSIS — R0602 Shortness of breath: Secondary | ICD-10-CM | POA: Diagnosis not present

## 2018-08-03 DIAGNOSIS — B460 Pulmonary mucormycosis: Secondary | ICD-10-CM | POA: Diagnosis not present

## 2018-08-04 DIAGNOSIS — E1122 Type 2 diabetes mellitus with diabetic chronic kidney disease: Secondary | ICD-10-CM | POA: Diagnosis not present

## 2018-08-04 DIAGNOSIS — N189 Chronic kidney disease, unspecified: Secondary | ICD-10-CM | POA: Diagnosis not present

## 2018-08-04 DIAGNOSIS — J852 Abscess of lung without pneumonia: Secondary | ICD-10-CM | POA: Diagnosis not present

## 2018-08-04 DIAGNOSIS — F4321 Adjustment disorder with depressed mood: Secondary | ICD-10-CM | POA: Diagnosis not present

## 2018-08-04 DIAGNOSIS — I129 Hypertensive chronic kidney disease with stage 1 through stage 4 chronic kidney disease, or unspecified chronic kidney disease: Secondary | ICD-10-CM | POA: Diagnosis not present

## 2018-08-04 DIAGNOSIS — D631 Anemia in chronic kidney disease: Secondary | ICD-10-CM | POA: Diagnosis not present

## 2018-08-12 DIAGNOSIS — Z940 Kidney transplant status: Secondary | ICD-10-CM | POA: Diagnosis not present

## 2018-08-22 DIAGNOSIS — Z940 Kidney transplant status: Secondary | ICD-10-CM | POA: Diagnosis not present

## 2018-08-23 DIAGNOSIS — I313 Pericardial effusion (noninflammatory): Secondary | ICD-10-CM | POA: Diagnosis not present

## 2018-08-31 DIAGNOSIS — Z940 Kidney transplant status: Secondary | ICD-10-CM | POA: Diagnosis not present

## 2018-08-31 DIAGNOSIS — R197 Diarrhea, unspecified: Secondary | ICD-10-CM | POA: Diagnosis not present

## 2018-08-31 DIAGNOSIS — Z4822 Encounter for aftercare following kidney transplant: Secondary | ICD-10-CM | POA: Diagnosis not present

## 2018-08-31 DIAGNOSIS — R51 Headache: Secondary | ICD-10-CM | POA: Diagnosis not present

## 2018-08-31 DIAGNOSIS — I313 Pericardial effusion (noninflammatory): Secondary | ICD-10-CM | POA: Diagnosis not present

## 2018-08-31 DIAGNOSIS — I517 Cardiomegaly: Secondary | ICD-10-CM | POA: Diagnosis not present

## 2018-08-31 DIAGNOSIS — Z8709 Personal history of other diseases of the respiratory system: Secondary | ICD-10-CM | POA: Diagnosis not present

## 2018-08-31 DIAGNOSIS — Z1159 Encounter for screening for other viral diseases: Secondary | ICD-10-CM | POA: Diagnosis not present

## 2018-08-31 DIAGNOSIS — B259 Cytomegaloviral disease, unspecified: Secondary | ICD-10-CM | POA: Diagnosis not present

## 2018-08-31 DIAGNOSIS — Z79899 Other long term (current) drug therapy: Secondary | ICD-10-CM | POA: Diagnosis not present

## 2018-08-31 DIAGNOSIS — B460 Pulmonary mucormycosis: Secondary | ICD-10-CM | POA: Diagnosis not present

## 2018-08-31 DIAGNOSIS — Z5181 Encounter for therapeutic drug level monitoring: Secondary | ICD-10-CM | POA: Diagnosis not present

## 2018-08-31 DIAGNOSIS — Z205 Contact with and (suspected) exposure to viral hepatitis: Secondary | ICD-10-CM | POA: Diagnosis not present

## 2018-08-31 DIAGNOSIS — I491 Atrial premature depolarization: Secondary | ICD-10-CM | POA: Diagnosis not present

## 2018-08-31 DIAGNOSIS — I120 Hypertensive chronic kidney disease with stage 5 chronic kidney disease or end stage renal disease: Secondary | ICD-10-CM | POA: Diagnosis not present

## 2018-08-31 DIAGNOSIS — A09 Infectious gastroenteritis and colitis, unspecified: Secondary | ICD-10-CM | POA: Diagnosis not present

## 2018-08-31 DIAGNOSIS — Z902 Acquired absence of lung [part of]: Secondary | ICD-10-CM | POA: Diagnosis not present

## 2018-08-31 DIAGNOSIS — Z20828 Contact with and (suspected) exposure to other viral communicable diseases: Secondary | ICD-10-CM | POA: Diagnosis not present

## 2018-08-31 DIAGNOSIS — N186 End stage renal disease: Secondary | ICD-10-CM | POA: Diagnosis not present

## 2018-08-31 DIAGNOSIS — E1122 Type 2 diabetes mellitus with diabetic chronic kidney disease: Secondary | ICD-10-CM | POA: Diagnosis not present

## 2018-08-31 DIAGNOSIS — Z992 Dependence on renal dialysis: Secondary | ICD-10-CM | POA: Diagnosis not present

## 2018-08-31 DIAGNOSIS — R9431 Abnormal electrocardiogram [ECG] [EKG]: Secondary | ICD-10-CM | POA: Diagnosis not present

## 2018-08-31 DIAGNOSIS — B465 Mucormycosis, unspecified: Secondary | ICD-10-CM | POA: Diagnosis not present

## 2018-08-31 DIAGNOSIS — Z87440 Personal history of urinary (tract) infections: Secondary | ICD-10-CM | POA: Diagnosis not present

## 2018-08-31 DIAGNOSIS — D899 Disorder involving the immune mechanism, unspecified: Secondary | ICD-10-CM | POA: Diagnosis not present

## 2018-09-02 LAB — HM HEPATITIS C SCREENING LAB: HM Hepatitis Screen: NEGATIVE

## 2018-09-14 DIAGNOSIS — Z5181 Encounter for therapeutic drug level monitoring: Secondary | ICD-10-CM | POA: Diagnosis not present

## 2018-09-14 DIAGNOSIS — Z940 Kidney transplant status: Secondary | ICD-10-CM | POA: Diagnosis not present

## 2018-09-15 DIAGNOSIS — Z940 Kidney transplant status: Secondary | ICD-10-CM | POA: Diagnosis not present

## 2018-09-15 DIAGNOSIS — Z5181 Encounter for therapeutic drug level monitoring: Secondary | ICD-10-CM | POA: Diagnosis not present

## 2018-09-26 DIAGNOSIS — I517 Cardiomegaly: Secondary | ICD-10-CM | POA: Diagnosis not present

## 2018-10-05 DIAGNOSIS — Z902 Acquired absence of lung [part of]: Secondary | ICD-10-CM | POA: Diagnosis not present

## 2018-10-05 DIAGNOSIS — Z940 Kidney transplant status: Secondary | ICD-10-CM | POA: Diagnosis not present

## 2018-10-05 DIAGNOSIS — B259 Cytomegaloviral disease, unspecified: Secondary | ICD-10-CM | POA: Diagnosis not present

## 2018-10-05 DIAGNOSIS — B465 Mucormycosis, unspecified: Secondary | ICD-10-CM | POA: Diagnosis not present

## 2018-10-05 DIAGNOSIS — D899 Disorder involving the immune mechanism, unspecified: Secondary | ICD-10-CM | POA: Diagnosis not present

## 2018-10-05 DIAGNOSIS — Z5181 Encounter for therapeutic drug level monitoring: Secondary | ICD-10-CM | POA: Diagnosis not present

## 2018-10-12 DIAGNOSIS — Z940 Kidney transplant status: Secondary | ICD-10-CM | POA: Diagnosis not present

## 2018-10-19 DIAGNOSIS — Z5181 Encounter for therapeutic drug level monitoring: Secondary | ICD-10-CM | POA: Diagnosis not present

## 2018-10-24 ENCOUNTER — Other Ambulatory Visit: Payer: Self-pay

## 2018-10-27 DIAGNOSIS — Z940 Kidney transplant status: Secondary | ICD-10-CM | POA: Diagnosis not present

## 2018-10-27 DIAGNOSIS — N390 Urinary tract infection, site not specified: Secondary | ICD-10-CM | POA: Diagnosis not present

## 2018-11-02 DIAGNOSIS — Z79899 Other long term (current) drug therapy: Secondary | ICD-10-CM | POA: Diagnosis not present

## 2018-11-02 DIAGNOSIS — R197 Diarrhea, unspecified: Secondary | ICD-10-CM | POA: Diagnosis not present

## 2018-11-02 DIAGNOSIS — R6883 Chills (without fever): Secondary | ICD-10-CM | POA: Diagnosis not present

## 2018-11-02 DIAGNOSIS — B259 Cytomegaloviral disease, unspecified: Secondary | ICD-10-CM | POA: Diagnosis not present

## 2018-11-02 DIAGNOSIS — K521 Toxic gastroenteritis and colitis: Secondary | ICD-10-CM | POA: Diagnosis not present

## 2018-11-02 DIAGNOSIS — Z4822 Encounter for aftercare following kidney transplant: Secondary | ICD-10-CM | POA: Diagnosis not present

## 2018-11-02 DIAGNOSIS — B460 Pulmonary mucormycosis: Secondary | ICD-10-CM | POA: Diagnosis not present

## 2018-11-02 DIAGNOSIS — R5383 Other fatigue: Secondary | ICD-10-CM | POA: Diagnosis not present

## 2018-11-02 DIAGNOSIS — Z940 Kidney transplant status: Secondary | ICD-10-CM | POA: Diagnosis not present

## 2018-11-09 DIAGNOSIS — Z940 Kidney transplant status: Secondary | ICD-10-CM | POA: Diagnosis not present

## 2018-11-16 DIAGNOSIS — Z902 Acquired absence of lung [part of]: Secondary | ICD-10-CM | POA: Diagnosis not present

## 2018-11-16 DIAGNOSIS — B465 Mucormycosis, unspecified: Secondary | ICD-10-CM | POA: Diagnosis not present

## 2018-11-16 DIAGNOSIS — D899 Disorder involving the immune mechanism, unspecified: Secondary | ICD-10-CM | POA: Diagnosis not present

## 2018-11-16 DIAGNOSIS — Z940 Kidney transplant status: Secondary | ICD-10-CM | POA: Diagnosis not present

## 2018-11-16 DIAGNOSIS — B259 Cytomegaloviral disease, unspecified: Secondary | ICD-10-CM | POA: Diagnosis not present

## 2018-11-17 DIAGNOSIS — I10 Essential (primary) hypertension: Secondary | ICD-10-CM | POA: Diagnosis not present

## 2018-11-17 DIAGNOSIS — D899 Disorder involving the immune mechanism, unspecified: Secondary | ICD-10-CM | POA: Diagnosis not present

## 2018-11-17 DIAGNOSIS — Z79899 Other long term (current) drug therapy: Secondary | ICD-10-CM | POA: Diagnosis not present

## 2018-11-17 DIAGNOSIS — Z940 Kidney transplant status: Secondary | ICD-10-CM | POA: Diagnosis not present

## 2018-11-17 DIAGNOSIS — Z792 Long term (current) use of antibiotics: Secondary | ICD-10-CM | POA: Diagnosis not present

## 2018-11-30 DIAGNOSIS — Z940 Kidney transplant status: Secondary | ICD-10-CM | POA: Diagnosis not present

## 2018-12-02 DIAGNOSIS — Z940 Kidney transplant status: Secondary | ICD-10-CM | POA: Diagnosis not present

## 2018-12-02 DIAGNOSIS — N390 Urinary tract infection, site not specified: Secondary | ICD-10-CM | POA: Diagnosis not present

## 2018-12-20 DIAGNOSIS — Z940 Kidney transplant status: Secondary | ICD-10-CM | POA: Diagnosis not present

## 2018-12-21 DIAGNOSIS — R634 Abnormal weight loss: Secondary | ICD-10-CM | POA: Diagnosis not present

## 2018-12-21 DIAGNOSIS — D899 Disorder involving the immune mechanism, unspecified: Secondary | ICD-10-CM | POA: Diagnosis not present

## 2018-12-21 DIAGNOSIS — Z5181 Encounter for therapeutic drug level monitoring: Secondary | ICD-10-CM | POA: Diagnosis not present

## 2018-12-21 DIAGNOSIS — Z902 Acquired absence of lung [part of]: Secondary | ICD-10-CM | POA: Diagnosis not present

## 2018-12-21 DIAGNOSIS — Z940 Kidney transplant status: Secondary | ICD-10-CM | POA: Diagnosis not present

## 2018-12-21 DIAGNOSIS — B259 Cytomegaloviral disease, unspecified: Secondary | ICD-10-CM | POA: Diagnosis not present

## 2018-12-21 DIAGNOSIS — B465 Mucormycosis, unspecified: Secondary | ICD-10-CM | POA: Diagnosis not present

## 2018-12-29 DIAGNOSIS — B465 Mucormycosis, unspecified: Secondary | ICD-10-CM | POA: Diagnosis not present

## 2018-12-29 DIAGNOSIS — Z794 Long term (current) use of insulin: Secondary | ICD-10-CM | POA: Diagnosis not present

## 2018-12-29 DIAGNOSIS — Z940 Kidney transplant status: Secondary | ICD-10-CM | POA: Diagnosis not present

## 2018-12-29 DIAGNOSIS — I313 Pericardial effusion (noninflammatory): Secondary | ICD-10-CM | POA: Diagnosis not present

## 2018-12-29 DIAGNOSIS — R634 Abnormal weight loss: Secondary | ICD-10-CM | POA: Diagnosis not present

## 2018-12-29 DIAGNOSIS — D849 Immunodeficiency, unspecified: Secondary | ICD-10-CM | POA: Diagnosis not present

## 2018-12-29 DIAGNOSIS — Z5181 Encounter for therapeutic drug level monitoring: Secondary | ICD-10-CM | POA: Diagnosis not present

## 2018-12-29 DIAGNOSIS — E0822 Diabetes mellitus due to underlying condition with diabetic chronic kidney disease: Secondary | ICD-10-CM | POA: Diagnosis not present

## 2018-12-29 DIAGNOSIS — Z1231 Encounter for screening mammogram for malignant neoplasm of breast: Secondary | ICD-10-CM | POA: Diagnosis not present

## 2018-12-29 DIAGNOSIS — R918 Other nonspecific abnormal finding of lung field: Secondary | ICD-10-CM | POA: Diagnosis not present

## 2019-01-03 DIAGNOSIS — E11649 Type 2 diabetes mellitus with hypoglycemia without coma: Secondary | ICD-10-CM | POA: Diagnosis not present

## 2019-01-03 DIAGNOSIS — Z794 Long term (current) use of insulin: Secondary | ICD-10-CM | POA: Diagnosis not present

## 2019-01-03 DIAGNOSIS — L97529 Non-pressure chronic ulcer of other part of left foot with unspecified severity: Secondary | ICD-10-CM | POA: Diagnosis not present

## 2019-01-03 DIAGNOSIS — Z23 Encounter for immunization: Secondary | ICD-10-CM | POA: Diagnosis not present

## 2019-01-03 DIAGNOSIS — E11621 Type 2 diabetes mellitus with foot ulcer: Secondary | ICD-10-CM | POA: Diagnosis not present

## 2019-01-03 DIAGNOSIS — Z940 Kidney transplant status: Secondary | ICD-10-CM | POA: Diagnosis not present

## 2019-01-03 DIAGNOSIS — Z5181 Encounter for therapeutic drug level monitoring: Secondary | ICD-10-CM | POA: Diagnosis not present

## 2019-01-06 DIAGNOSIS — Z5181 Encounter for therapeutic drug level monitoring: Secondary | ICD-10-CM | POA: Diagnosis not present

## 2019-01-06 DIAGNOSIS — B349 Viral infection, unspecified: Secondary | ICD-10-CM | POA: Diagnosis not present

## 2019-01-06 DIAGNOSIS — N390 Urinary tract infection, site not specified: Secondary | ICD-10-CM | POA: Diagnosis not present

## 2019-01-06 DIAGNOSIS — Z9483 Pancreas transplant status: Secondary | ICD-10-CM | POA: Diagnosis not present

## 2019-01-06 DIAGNOSIS — B259 Cytomegaloviral disease, unspecified: Secondary | ICD-10-CM | POA: Diagnosis not present

## 2019-01-06 DIAGNOSIS — Z940 Kidney transplant status: Secondary | ICD-10-CM | POA: Diagnosis not present

## 2019-01-11 DIAGNOSIS — N390 Urinary tract infection, site not specified: Secondary | ICD-10-CM | POA: Diagnosis not present

## 2019-01-12 DIAGNOSIS — B351 Tinea unguium: Secondary | ICD-10-CM | POA: Diagnosis not present

## 2019-01-12 DIAGNOSIS — L97521 Non-pressure chronic ulcer of other part of left foot limited to breakdown of skin: Secondary | ICD-10-CM | POA: Diagnosis not present

## 2019-01-12 DIAGNOSIS — M79674 Pain in right toe(s): Secondary | ICD-10-CM | POA: Diagnosis not present

## 2019-01-12 DIAGNOSIS — M79675 Pain in left toe(s): Secondary | ICD-10-CM | POA: Diagnosis not present

## 2019-01-12 DIAGNOSIS — L03039 Cellulitis of unspecified toe: Secondary | ICD-10-CM | POA: Diagnosis not present

## 2019-01-12 DIAGNOSIS — E1151 Type 2 diabetes mellitus with diabetic peripheral angiopathy without gangrene: Secondary | ICD-10-CM | POA: Diagnosis not present

## 2019-01-18 ENCOUNTER — Other Ambulatory Visit: Payer: Self-pay

## 2019-01-18 ENCOUNTER — Ambulatory Visit: Payer: Medicare Other | Admitting: Family Medicine

## 2019-01-18 ENCOUNTER — Ambulatory Visit (INDEPENDENT_AMBULATORY_CARE_PROVIDER_SITE_OTHER): Payer: Medicare Other | Admitting: Family Medicine

## 2019-01-18 VITALS — BP 148/90 | Temp 96.4°F | Wt 145.6 lb

## 2019-01-18 DIAGNOSIS — L97529 Non-pressure chronic ulcer of other part of left foot with unspecified severity: Secondary | ICD-10-CM | POA: Diagnosis not present

## 2019-01-18 DIAGNOSIS — B351 Tinea unguium: Secondary | ICD-10-CM | POA: Diagnosis not present

## 2019-01-18 DIAGNOSIS — R0989 Other specified symptoms and signs involving the circulatory and respiratory systems: Secondary | ICD-10-CM

## 2019-01-18 DIAGNOSIS — L03039 Cellulitis of unspecified toe: Secondary | ICD-10-CM | POA: Diagnosis not present

## 2019-01-18 DIAGNOSIS — D849 Immunodeficiency, unspecified: Secondary | ICD-10-CM | POA: Diagnosis not present

## 2019-01-18 DIAGNOSIS — Z940 Kidney transplant status: Secondary | ICD-10-CM | POA: Diagnosis not present

## 2019-01-18 DIAGNOSIS — Z902 Acquired absence of lung [part of]: Secondary | ICD-10-CM | POA: Diagnosis not present

## 2019-01-18 DIAGNOSIS — B465 Mucormycosis, unspecified: Secondary | ICD-10-CM | POA: Diagnosis not present

## 2019-01-18 MED ORDER — MUPIROCIN 2 % EX OINT: TOPICAL_OINTMENT | CUTANEOUS | 0 refills | Status: DC

## 2019-01-18 MED ORDER — AMOXICILLIN-POT CLAVULANATE 875-125 MG PO TABS: ORAL_TABLET | ORAL | 0 refills | Status: DC

## 2019-01-18 NOTE — Progress Notes (Signed)
   Subjective:    Patient ID: Shelley [name], female    DOB: December 27, 1950, 68 y.o.   MRN: [id number]  Toe Pain  Incident onset: one week ago. Pain location: left toe.  pt is diabetic and believes the infection is due to low circulation. Pt has bandages on right toe and left toe. Left toe is worse. Pt has tried OTC ointment. Pt contacted her podiatrist at [facility]. She also contacted her infectious disease doctor and they informed her to go to PCP soon.   Patient actually has already seen a specialist last week at [facility] for this issue.  Best I can tell saw an orthopedist.  They gave her an antibiotic ointment.  Her toe is worsened.  Her infectious disease doctor virtual recommended she go and see your primary care doctor.  They also expressed concern about blood flow to her legs  She is a transplant renal patient.  On immunosuppressants.  No fever no chills  Some slight drainage from area on left great toe  States sugars overall in decent control Review of Systems No headache no chest pain no shortness of breath    Objective:   Physical Exam  Alert and oriented, vitals reviewed and stable, NAD ENT-TM's and ext canals WNL bilat via otoscopic exam Soft palate, tonsils and post pharynx WNL via oropharyngeal exam Neck-symmetric, no masses; thyroid nonpalpable and nontender Pulmonary-no tachypnea or accessory muscle use; Clear without wheezes via auscultation Card--no abnrml murmurs, rhythm reg and rate WNL Carotid pulses symmetric, without bruits Left great toe positive ulceration medial aspect.  Dorsalis pedis pulses diminished bilateral.  Capillary refill somewhat slowed.  Very slight discharge.       Assessment & Plan:  Impression toe ulceration with secondary infection.  Will add oral antibiotics.  Patient needs to see a wound care specialist.  She would prefer to go to [facility] with this.  She will contact her renal care coordinator.  I have advised her to still follow-up with the foot doctor  who saw her last week for her next appointment next week.  We will do arterial studies to get a better sense of white forms of feet.  Warning signs discussed carefully  Greater than 50% of this 25 minute face to face visit was spent in counseling and discussion and coordination of care regarding the above diagnosis/diagnosies

## 2019-01-18 NOTE — Patient Instructions (Signed)
w wil add oral antibiotics today augmentin  We will order arterial doppler studies both legs   With the ulceration present on the foot, recommend a wound care specialist consultation to reassess  Due to ulcertion of left great toe with questionable arterial blood flow (studies pending)

## 2019-01-23 ENCOUNTER — Ambulatory Visit (HOSPITAL_COMMUNITY): Payer: Medicare Other

## 2019-01-26 DIAGNOSIS — E118 Type 2 diabetes mellitus with unspecified complications: Secondary | ICD-10-CM | POA: Diagnosis not present

## 2019-01-26 DIAGNOSIS — E1165 Type 2 diabetes mellitus with hyperglycemia: Secondary | ICD-10-CM | POA: Diagnosis not present

## 2019-01-26 DIAGNOSIS — Z9889 Other specified postprocedural states: Secondary | ICD-10-CM | POA: Diagnosis not present

## 2019-01-26 DIAGNOSIS — Z940 Kidney transplant status: Secondary | ICD-10-CM | POA: Diagnosis not present

## 2019-01-26 DIAGNOSIS — L97522 Non-pressure chronic ulcer of other part of left foot with fat layer exposed: Secondary | ICD-10-CM | POA: Diagnosis not present

## 2019-01-26 DIAGNOSIS — I739 Peripheral vascular disease, unspecified: Secondary | ICD-10-CM | POA: Diagnosis not present

## 2019-01-26 DIAGNOSIS — E11621 Type 2 diabetes mellitus with foot ulcer: Secondary | ICD-10-CM | POA: Diagnosis not present

## 2019-01-26 DIAGNOSIS — L97511 Non-pressure chronic ulcer of other part of right foot limited to breakdown of skin: Secondary | ICD-10-CM | POA: Diagnosis not present

## 2019-02-07 DIAGNOSIS — Z940 Kidney transplant status: Secondary | ICD-10-CM | POA: Diagnosis not present

## 2019-02-07 DIAGNOSIS — Z9483 Pancreas transplant status: Secondary | ICD-10-CM | POA: Diagnosis not present

## 2019-02-07 DIAGNOSIS — Z5181 Encounter for therapeutic drug level monitoring: Secondary | ICD-10-CM | POA: Diagnosis not present

## 2019-02-07 DIAGNOSIS — N390 Urinary tract infection, site not specified: Secondary | ICD-10-CM | POA: Diagnosis not present

## 2019-02-07 DIAGNOSIS — B349 Viral infection, unspecified: Secondary | ICD-10-CM | POA: Diagnosis not present

## 2019-02-07 DIAGNOSIS — B259 Cytomegaloviral disease, unspecified: Secondary | ICD-10-CM | POA: Diagnosis not present

## 2019-02-08 DIAGNOSIS — I70245 Atherosclerosis of native arteries of left leg with ulceration of other part of foot: Secondary | ICD-10-CM | POA: Diagnosis not present

## 2019-02-08 DIAGNOSIS — L97522 Non-pressure chronic ulcer of other part of left foot with fat layer exposed: Secondary | ICD-10-CM | POA: Diagnosis not present

## 2019-02-20 DIAGNOSIS — E13621 Other specified diabetes mellitus with foot ulcer: Secondary | ICD-10-CM | POA: Diagnosis not present

## 2019-02-20 DIAGNOSIS — Z940 Kidney transplant status: Secondary | ICD-10-CM | POA: Diagnosis not present

## 2019-02-20 DIAGNOSIS — E118 Type 2 diabetes mellitus with unspecified complications: Secondary | ICD-10-CM | POA: Diagnosis not present

## 2019-02-20 DIAGNOSIS — L97509 Non-pressure chronic ulcer of other part of unspecified foot with unspecified severity: Secondary | ICD-10-CM | POA: Diagnosis not present

## 2019-02-20 DIAGNOSIS — E1165 Type 2 diabetes mellitus with hyperglycemia: Secondary | ICD-10-CM | POA: Diagnosis not present

## 2019-02-20 DIAGNOSIS — Z5181 Encounter for therapeutic drug level monitoring: Secondary | ICD-10-CM | POA: Diagnosis not present

## 2019-02-21 DIAGNOSIS — L97529 Non-pressure chronic ulcer of other part of left foot with unspecified severity: Secondary | ICD-10-CM | POA: Diagnosis not present

## 2019-02-21 DIAGNOSIS — I739 Peripheral vascular disease, unspecified: Secondary | ICD-10-CM | POA: Diagnosis not present

## 2019-02-23 DIAGNOSIS — Z940 Kidney transplant status: Secondary | ICD-10-CM | POA: Diagnosis not present

## 2019-02-27 DIAGNOSIS — Z5181 Encounter for therapeutic drug level monitoring: Secondary | ICD-10-CM | POA: Diagnosis not present

## 2019-02-27 DIAGNOSIS — Z940 Kidney transplant status: Secondary | ICD-10-CM | POA: Diagnosis not present

## 2019-03-03 DIAGNOSIS — Z940 Kidney transplant status: Secondary | ICD-10-CM | POA: Diagnosis not present

## 2019-03-03 DIAGNOSIS — D849 Immunodeficiency, unspecified: Secondary | ICD-10-CM | POA: Diagnosis not present

## 2019-04-03 DIAGNOSIS — L97509 Non-pressure chronic ulcer of other part of unspecified foot with unspecified severity: Secondary | ICD-10-CM | POA: Diagnosis not present

## 2019-04-03 DIAGNOSIS — Z940 Kidney transplant status: Secondary | ICD-10-CM | POA: Diagnosis not present

## 2019-04-03 DIAGNOSIS — E1165 Type 2 diabetes mellitus with hyperglycemia: Secondary | ICD-10-CM | POA: Diagnosis not present

## 2019-04-03 DIAGNOSIS — Z5181 Encounter for therapeutic drug level monitoring: Secondary | ICD-10-CM | POA: Diagnosis not present

## 2019-04-03 DIAGNOSIS — E118 Type 2 diabetes mellitus with unspecified complications: Secondary | ICD-10-CM | POA: Diagnosis not present

## 2019-04-03 DIAGNOSIS — E13621 Other specified diabetes mellitus with foot ulcer: Secondary | ICD-10-CM | POA: Diagnosis not present

## 2019-04-08 DIAGNOSIS — R918 Other nonspecific abnormal finding of lung field: Secondary | ICD-10-CM | POA: Diagnosis not present

## 2019-04-08 DIAGNOSIS — J168 Pneumonia due to other specified infectious organisms: Secondary | ICD-10-CM | POA: Diagnosis not present

## 2019-04-08 DIAGNOSIS — B460 Pulmonary mucormycosis: Secondary | ICD-10-CM | POA: Diagnosis not present

## 2019-04-08 DIAGNOSIS — Z940 Kidney transplant status: Secondary | ICD-10-CM | POA: Diagnosis not present

## 2019-04-08 DIAGNOSIS — Z902 Acquired absence of lung [part of]: Secondary | ICD-10-CM | POA: Diagnosis not present

## 2019-04-08 DIAGNOSIS — I313 Pericardial effusion (noninflammatory): Secondary | ICD-10-CM | POA: Diagnosis not present

## 2019-04-08 DIAGNOSIS — D849 Immunodeficiency, unspecified: Secondary | ICD-10-CM | POA: Diagnosis not present

## 2019-04-11 DIAGNOSIS — M86672 Other chronic osteomyelitis, left ankle and foot: Secondary | ICD-10-CM | POA: Diagnosis not present

## 2019-04-11 DIAGNOSIS — M79675 Pain in left toe(s): Secondary | ICD-10-CM | POA: Diagnosis not present

## 2019-04-11 DIAGNOSIS — L97524 Non-pressure chronic ulcer of other part of left foot with necrosis of bone: Secondary | ICD-10-CM | POA: Diagnosis not present

## 2019-04-11 DIAGNOSIS — E1151 Type 2 diabetes mellitus with diabetic peripheral angiopathy without gangrene: Secondary | ICD-10-CM | POA: Diagnosis not present

## 2019-04-11 DIAGNOSIS — B351 Tinea unguium: Secondary | ICD-10-CM | POA: Diagnosis not present

## 2019-04-12 DIAGNOSIS — L97529 Non-pressure chronic ulcer of other part of left foot with unspecified severity: Secondary | ICD-10-CM | POA: Diagnosis not present

## 2019-04-12 DIAGNOSIS — Z940 Kidney transplant status: Secondary | ICD-10-CM | POA: Diagnosis not present

## 2019-04-12 DIAGNOSIS — B460 Pulmonary mucormycosis: Secondary | ICD-10-CM | POA: Diagnosis not present

## 2019-04-12 DIAGNOSIS — B465 Mucormycosis, unspecified: Secondary | ICD-10-CM | POA: Diagnosis not present

## 2019-04-12 DIAGNOSIS — D849 Immunodeficiency, unspecified: Secondary | ICD-10-CM | POA: Diagnosis not present

## 2019-04-12 DIAGNOSIS — E08621 Diabetes mellitus due to underlying condition with foot ulcer: Secondary | ICD-10-CM | POA: Diagnosis not present

## 2019-04-12 DIAGNOSIS — B259 Cytomegaloviral disease, unspecified: Secondary | ICD-10-CM | POA: Diagnosis not present

## 2019-04-21 DIAGNOSIS — D849 Immunodeficiency, unspecified: Secondary | ICD-10-CM | POA: Diagnosis not present

## 2019-04-21 DIAGNOSIS — Z940 Kidney transplant status: Secondary | ICD-10-CM | POA: Diagnosis not present

## 2019-05-04 DIAGNOSIS — Z4822 Encounter for aftercare following kidney transplant: Secondary | ICD-10-CM | POA: Diagnosis not present

## 2019-05-04 DIAGNOSIS — L03039 Cellulitis of unspecified toe: Secondary | ICD-10-CM | POA: Diagnosis not present

## 2019-05-04 DIAGNOSIS — I70245 Atherosclerosis of native arteries of left leg with ulceration of other part of foot: Secondary | ICD-10-CM | POA: Diagnosis not present

## 2019-05-04 DIAGNOSIS — R791 Abnormal coagulation profile: Secondary | ICD-10-CM | POA: Diagnosis not present

## 2019-05-04 DIAGNOSIS — R0602 Shortness of breath: Secondary | ICD-10-CM | POA: Diagnosis not present

## 2019-05-04 DIAGNOSIS — R079 Chest pain, unspecified: Secondary | ICD-10-CM | POA: Diagnosis not present

## 2019-05-04 DIAGNOSIS — A498 Other bacterial infections of unspecified site: Secondary | ICD-10-CM | POA: Diagnosis not present

## 2019-05-04 DIAGNOSIS — B351 Tinea unguium: Secondary | ICD-10-CM | POA: Diagnosis not present

## 2019-05-04 DIAGNOSIS — E1165 Type 2 diabetes mellitus with hyperglycemia: Secondary | ICD-10-CM | POA: Diagnosis not present

## 2019-05-04 DIAGNOSIS — N179 Acute kidney failure, unspecified: Secondary | ICD-10-CM | POA: Diagnosis not present

## 2019-05-04 DIAGNOSIS — E782 Mixed hyperlipidemia: Secondary | ICD-10-CM | POA: Diagnosis not present

## 2019-05-04 DIAGNOSIS — Z940 Kidney transplant status: Secondary | ICD-10-CM | POA: Diagnosis not present

## 2019-05-04 DIAGNOSIS — J90 Pleural effusion, not elsewhere classified: Secondary | ICD-10-CM | POA: Diagnosis not present

## 2019-05-04 DIAGNOSIS — E639 Nutritional deficiency, unspecified: Secondary | ICD-10-CM | POA: Diagnosis not present

## 2019-05-04 DIAGNOSIS — M79622 Pain in left upper arm: Secondary | ICD-10-CM | POA: Diagnosis not present

## 2019-05-04 DIAGNOSIS — D520 Dietary folate deficiency anemia: Secondary | ICD-10-CM | POA: Diagnosis not present

## 2019-05-04 DIAGNOSIS — E1151 Type 2 diabetes mellitus with diabetic peripheral angiopathy without gangrene: Secondary | ICD-10-CM | POA: Diagnosis not present

## 2019-05-04 DIAGNOSIS — M86672 Other chronic osteomyelitis, left ankle and foot: Secondary | ICD-10-CM | POA: Diagnosis not present

## 2019-05-04 DIAGNOSIS — B465 Mucormycosis, unspecified: Secondary | ICD-10-CM | POA: Diagnosis not present

## 2019-05-04 DIAGNOSIS — F4321 Adjustment disorder with depressed mood: Secondary | ICD-10-CM | POA: Diagnosis not present

## 2019-05-04 DIAGNOSIS — E118 Type 2 diabetes mellitus with unspecified complications: Secondary | ICD-10-CM | POA: Diagnosis not present

## 2019-05-04 DIAGNOSIS — T380X5A Adverse effect of glucocorticoids and synthetic analogues, initial encounter: Secondary | ICD-10-CM | POA: Diagnosis not present

## 2019-05-04 DIAGNOSIS — D84821 Immunodeficiency due to drugs: Secondary | ICD-10-CM | POA: Diagnosis not present

## 2019-05-04 DIAGNOSIS — L97524 Non-pressure chronic ulcer of other part of left foot with necrosis of bone: Secondary | ICD-10-CM | POA: Diagnosis not present

## 2019-05-04 DIAGNOSIS — E878 Other disorders of electrolyte and fluid balance, not elsewhere classified: Secondary | ICD-10-CM | POA: Diagnosis not present

## 2019-05-04 DIAGNOSIS — I10 Essential (primary) hypertension: Secondary | ICD-10-CM | POA: Diagnosis not present

## 2019-05-09 DIAGNOSIS — M86672 Other chronic osteomyelitis, left ankle and foot: Secondary | ICD-10-CM | POA: Diagnosis not present

## 2019-05-09 DIAGNOSIS — I70245 Atherosclerosis of native arteries of left leg with ulceration of other part of foot: Secondary | ICD-10-CM | POA: Diagnosis not present

## 2019-05-09 DIAGNOSIS — L97524 Non-pressure chronic ulcer of other part of left foot with necrosis of bone: Secondary | ICD-10-CM | POA: Diagnosis not present

## 2019-05-09 DIAGNOSIS — I96 Gangrene, not elsewhere classified: Secondary | ICD-10-CM | POA: Diagnosis not present

## 2019-05-09 DIAGNOSIS — E1151 Type 2 diabetes mellitus with diabetic peripheral angiopathy without gangrene: Secondary | ICD-10-CM | POA: Diagnosis not present

## 2019-05-17 DIAGNOSIS — I739 Peripheral vascular disease, unspecified: Secondary | ICD-10-CM | POA: Diagnosis not present

## 2019-05-17 DIAGNOSIS — J9811 Atelectasis: Secondary | ICD-10-CM | POA: Diagnosis not present

## 2019-05-17 DIAGNOSIS — I70245 Atherosclerosis of native arteries of left leg with ulceration of other part of foot: Secondary | ICD-10-CM | POA: Diagnosis not present

## 2019-05-17 DIAGNOSIS — Z79899 Other long term (current) drug therapy: Secondary | ICD-10-CM | POA: Diagnosis not present

## 2019-05-17 DIAGNOSIS — Z87440 Personal history of urinary (tract) infections: Secondary | ICD-10-CM | POA: Diagnosis not present

## 2019-05-17 DIAGNOSIS — E1152 Type 2 diabetes mellitus with diabetic peripheral angiopathy with gangrene: Secondary | ICD-10-CM | POA: Diagnosis not present

## 2019-05-17 DIAGNOSIS — Z940 Kidney transplant status: Secondary | ICD-10-CM | POA: Diagnosis not present

## 2019-05-17 DIAGNOSIS — R918 Other nonspecific abnormal finding of lung field: Secondary | ICD-10-CM | POA: Diagnosis not present

## 2019-05-17 DIAGNOSIS — E1151 Type 2 diabetes mellitus with diabetic peripheral angiopathy without gangrene: Secondary | ICD-10-CM | POA: Diagnosis not present

## 2019-05-17 DIAGNOSIS — D751 Secondary polycythemia: Secondary | ICD-10-CM | POA: Diagnosis not present

## 2019-05-17 DIAGNOSIS — E11621 Type 2 diabetes mellitus with foot ulcer: Secondary | ICD-10-CM | POA: Diagnosis not present

## 2019-05-17 DIAGNOSIS — Z8709 Personal history of other diseases of the respiratory system: Secondary | ICD-10-CM | POA: Diagnosis not present

## 2019-05-17 DIAGNOSIS — D84821 Immunodeficiency due to drugs: Secondary | ICD-10-CM | POA: Diagnosis not present

## 2019-05-17 DIAGNOSIS — M86672 Other chronic osteomyelitis, left ankle and foot: Secondary | ICD-10-CM | POA: Diagnosis not present

## 2019-05-17 DIAGNOSIS — D849 Immunodeficiency, unspecified: Secondary | ICD-10-CM | POA: Diagnosis not present

## 2019-05-17 DIAGNOSIS — Z4822 Encounter for aftercare following kidney transplant: Secondary | ICD-10-CM | POA: Diagnosis not present

## 2019-05-17 DIAGNOSIS — Z9862 Peripheral vascular angioplasty status: Secondary | ICD-10-CM | POA: Diagnosis not present

## 2019-05-17 DIAGNOSIS — I96 Gangrene, not elsewhere classified: Secondary | ICD-10-CM | POA: Diagnosis not present

## 2019-05-17 DIAGNOSIS — Z902 Acquired absence of lung [part of]: Secondary | ICD-10-CM | POA: Diagnosis not present

## 2019-05-17 DIAGNOSIS — B259 Cytomegaloviral disease, unspecified: Secondary | ICD-10-CM | POA: Diagnosis not present

## 2019-05-17 DIAGNOSIS — L97522 Non-pressure chronic ulcer of other part of left foot with fat layer exposed: Secondary | ICD-10-CM | POA: Diagnosis not present

## 2019-05-17 DIAGNOSIS — I313 Pericardial effusion (noninflammatory): Secondary | ICD-10-CM | POA: Diagnosis not present

## 2019-05-17 DIAGNOSIS — R634 Abnormal weight loss: Secondary | ICD-10-CM | POA: Diagnosis not present

## 2019-05-17 DIAGNOSIS — Z7902 Long term (current) use of antithrombotics/antiplatelets: Secondary | ICD-10-CM | POA: Diagnosis not present

## 2019-05-17 DIAGNOSIS — Z8619 Personal history of other infectious and parasitic diseases: Secondary | ICD-10-CM | POA: Diagnosis not present

## 2019-05-17 DIAGNOSIS — D899 Disorder involving the immune mechanism, unspecified: Secondary | ICD-10-CM | POA: Diagnosis not present

## 2019-05-22 DIAGNOSIS — Z794 Long term (current) use of insulin: Secondary | ICD-10-CM | POA: Diagnosis not present

## 2019-05-22 DIAGNOSIS — Z940 Kidney transplant status: Secondary | ICD-10-CM | POA: Diagnosis not present

## 2019-05-22 DIAGNOSIS — E1169 Type 2 diabetes mellitus with other specified complication: Secondary | ICD-10-CM | POA: Diagnosis not present

## 2019-05-22 DIAGNOSIS — Z5181 Encounter for therapeutic drug level monitoring: Secondary | ICD-10-CM | POA: Diagnosis not present

## 2019-05-22 DIAGNOSIS — L97509 Non-pressure chronic ulcer of other part of unspecified foot with unspecified severity: Secondary | ICD-10-CM | POA: Diagnosis not present

## 2019-05-22 DIAGNOSIS — E13621 Other specified diabetes mellitus with foot ulcer: Secondary | ICD-10-CM | POA: Diagnosis not present

## 2019-05-23 DIAGNOSIS — Z940 Kidney transplant status: Secondary | ICD-10-CM | POA: Diagnosis not present

## 2019-05-26 DIAGNOSIS — E1169 Type 2 diabetes mellitus with other specified complication: Secondary | ICD-10-CM | POA: Diagnosis present

## 2019-05-26 DIAGNOSIS — I70213 Atherosclerosis of native arteries of extremities with intermittent claudication, bilateral legs: Secondary | ICD-10-CM | POA: Diagnosis not present

## 2019-05-26 DIAGNOSIS — E1152 Type 2 diabetes mellitus with diabetic peripheral angiopathy with gangrene: Secondary | ICD-10-CM | POA: Diagnosis present

## 2019-05-26 DIAGNOSIS — I70262 Atherosclerosis of native arteries of extremities with gangrene, left leg: Secondary | ICD-10-CM | POA: Diagnosis not present

## 2019-05-26 DIAGNOSIS — E1165 Type 2 diabetes mellitus with hyperglycemia: Secondary | ICD-10-CM | POA: Diagnosis not present

## 2019-05-26 DIAGNOSIS — B259 Cytomegaloviral disease, unspecified: Secondary | ICD-10-CM | POA: Diagnosis not present

## 2019-05-26 DIAGNOSIS — Z20822 Contact with and (suspected) exposure to covid-19: Secondary | ICD-10-CM | POA: Diagnosis present

## 2019-05-26 DIAGNOSIS — D849 Immunodeficiency, unspecified: Secondary | ICD-10-CM | POA: Diagnosis not present

## 2019-05-26 DIAGNOSIS — G8918 Other acute postprocedural pain: Secondary | ICD-10-CM | POA: Diagnosis not present

## 2019-05-26 DIAGNOSIS — E11621 Type 2 diabetes mellitus with foot ulcer: Secondary | ICD-10-CM | POA: Diagnosis present

## 2019-05-26 DIAGNOSIS — Z940 Kidney transplant status: Secondary | ICD-10-CM | POA: Diagnosis not present

## 2019-05-26 DIAGNOSIS — I96 Gangrene, not elsewhere classified: Secondary | ICD-10-CM | POA: Diagnosis not present

## 2019-05-26 DIAGNOSIS — E118 Type 2 diabetes mellitus with unspecified complications: Secondary | ICD-10-CM | POA: Diagnosis not present

## 2019-05-26 DIAGNOSIS — T380X5A Adverse effect of glucocorticoids and synthetic analogues, initial encounter: Secondary | ICD-10-CM | POA: Diagnosis present

## 2019-05-26 DIAGNOSIS — M86172 Other acute osteomyelitis, left ankle and foot: Secondary | ICD-10-CM | POA: Diagnosis not present

## 2019-05-26 DIAGNOSIS — L97529 Non-pressure chronic ulcer of other part of left foot with unspecified severity: Secondary | ICD-10-CM | POA: Diagnosis not present

## 2019-05-26 DIAGNOSIS — M86672 Other chronic osteomyelitis, left ankle and foot: Secondary | ICD-10-CM | POA: Diagnosis not present

## 2019-05-26 DIAGNOSIS — I10 Essential (primary) hypertension: Secondary | ICD-10-CM | POA: Diagnosis not present

## 2019-05-26 DIAGNOSIS — E782 Mixed hyperlipidemia: Secondary | ICD-10-CM | POA: Diagnosis not present

## 2019-05-26 DIAGNOSIS — I739 Peripheral vascular disease, unspecified: Secondary | ICD-10-CM | POA: Diagnosis not present

## 2019-05-26 DIAGNOSIS — I771 Stricture of artery: Secondary | ICD-10-CM | POA: Diagnosis present

## 2019-05-26 DIAGNOSIS — M86072 Acute hematogenous osteomyelitis, left ankle and foot: Secondary | ICD-10-CM | POA: Diagnosis not present

## 2019-06-08 MED ORDER — SENNOSIDES-DOCUSATE SODIUM 8.6-50 MG PO TABS
2.00 | ORAL_TABLET | ORAL | Status: DC
Start: 2019-06-08 — End: 2019-06-08

## 2019-06-08 MED ORDER — ERTAPENEM SODIUM 1 G IJ SOLR
1.00 | INTRAMUSCULAR | Status: DC
Start: 2019-06-08 — End: 2019-06-08

## 2019-06-08 MED ORDER — INSULIN GLARGINE 100 UNIT/ML ~~LOC~~ SOLN
12.00 | SUBCUTANEOUS | Status: DC
Start: 2019-06-07 — End: 2019-06-08

## 2019-06-08 MED ORDER — HYDROMORPHONE HCL 1 MG/ML IJ SOLN
0.50 | INTRAMUSCULAR | Status: DC
Start: ? — End: 2019-06-08

## 2019-06-08 MED ORDER — FOLIC ACID 1 MG PO TABS
1.00 | ORAL_TABLET | ORAL | Status: DC
Start: 2019-06-08 — End: 2019-06-08

## 2019-06-08 MED ORDER — LIDOCAINE HCL 1 % IJ SOLN
3.00 | INTRAMUSCULAR | Status: DC
Start: ? — End: 2019-06-08

## 2019-06-08 MED ORDER — OXYCODONE HCL 5 MG PO TABS
5.00 | ORAL_TABLET | ORAL | Status: DC
Start: ? — End: 2019-06-08

## 2019-06-08 MED ORDER — ATORVASTATIN CALCIUM 40 MG PO TABS
40.00 | ORAL_TABLET | ORAL | Status: DC
Start: 2019-06-07 — End: 2019-06-08

## 2019-06-08 MED ORDER — INSULIN LISPRO 100 UNIT/ML ~~LOC~~ SOLN
5.00 | SUBCUTANEOUS | Status: DC
Start: 2019-06-08 — End: 2019-06-08

## 2019-06-08 MED ORDER — TORSEMIDE 10 MG PO TABS
10.00 | ORAL_TABLET | ORAL | Status: DC
Start: 2019-06-08 — End: 2019-06-08

## 2019-06-08 MED ORDER — FAMOTIDINE 20 MG PO TABS
20.00 | ORAL_TABLET | ORAL | Status: DC
Start: 2019-06-07 — End: 2019-06-08

## 2019-06-08 MED ORDER — HEPARIN SODIUM (PORCINE) 5000 UNIT/ML IJ SOLN
5000.00 | INTRAMUSCULAR | Status: DC
Start: 2019-06-08 — End: 2019-06-08

## 2019-06-08 MED ORDER — VALGANCICLOVIR HCL 450 MG PO TABS
450.00 | ORAL_TABLET | ORAL | Status: DC
Start: 2019-06-08 — End: 2019-06-08

## 2019-06-08 MED ORDER — LISINOPRIL 5 MG PO TABS
5.00 | ORAL_TABLET | ORAL | Status: DC
Start: 2019-06-08 — End: 2019-06-08

## 2019-06-08 MED ORDER — GLUCAGON (RDNA) 1 MG IJ KIT
1.00 | PACK | INTRAMUSCULAR | Status: DC
Start: ? — End: 2019-06-08

## 2019-06-08 MED ORDER — ACETAMINOPHEN 325 MG PO TABS
500.00 | ORAL_TABLET | ORAL | Status: DC
Start: ? — End: 2019-06-08

## 2019-06-08 MED ORDER — INSULIN LISPRO 100 UNIT/ML ~~LOC~~ SOLN
8.00 | SUBCUTANEOUS | Status: DC
Start: 2019-06-08 — End: 2019-06-08

## 2019-06-08 MED ORDER — ASPIRIN 81 MG PO TBEC
81.00 | DELAYED_RELEASE_TABLET | ORAL | Status: DC
Start: 2019-06-08 — End: 2019-06-08

## 2019-06-08 MED ORDER — CLOPIDOGREL BISULFATE 75 MG PO TABS
75.00 | ORAL_TABLET | ORAL | Status: DC
Start: 2019-06-08 — End: 2019-06-08

## 2019-06-08 MED ORDER — TACROLIMUS ER 0.75 MG PO TB24
2.25 | ORAL_TABLET | ORAL | Status: DC
Start: 2019-06-08 — End: 2019-06-08

## 2019-06-08 MED ORDER — PREDNISONE 5 MG PO TABS
5.00 | ORAL_TABLET | ORAL | Status: DC
Start: 2019-06-08 — End: 2019-06-08

## 2019-06-08 MED ORDER — MYCOPHENOLATE MOFETIL 250 MG PO CAPS
500.00 | ORAL_CAPSULE | ORAL | Status: DC
Start: 2019-06-07 — End: 2019-06-08

## 2019-06-08 MED ORDER — SERTRALINE HCL 50 MG PO TABS
50.00 | ORAL_TABLET | ORAL | Status: DC
Start: 2019-06-08 — End: 2019-06-08

## 2019-06-08 MED ORDER — GABAPENTIN 100 MG PO CAPS
100.00 | ORAL_CAPSULE | ORAL | Status: DC
Start: 2019-06-07 — End: 2019-06-08

## 2019-06-08 MED ORDER — DEXTROSE 50 % IV SOLN
12.50 | INTRAVENOUS | Status: DC
Start: ? — End: 2019-06-08

## 2019-06-09 DIAGNOSIS — E782 Mixed hyperlipidemia: Secondary | ICD-10-CM | POA: Diagnosis not present

## 2019-06-09 DIAGNOSIS — D631 Anemia in chronic kidney disease: Secondary | ICD-10-CM | POA: Diagnosis not present

## 2019-06-09 DIAGNOSIS — J90 Pleural effusion, not elsewhere classified: Secondary | ICD-10-CM | POA: Diagnosis not present

## 2019-06-09 DIAGNOSIS — E1122 Type 2 diabetes mellitus with diabetic chronic kidney disease: Secondary | ICD-10-CM | POA: Diagnosis not present

## 2019-06-09 DIAGNOSIS — Z902 Acquired absence of lung [part of]: Secondary | ICD-10-CM | POA: Diagnosis not present

## 2019-06-09 DIAGNOSIS — I120 Hypertensive chronic kidney disease with stage 5 chronic kidney disease or end stage renal disease: Secondary | ICD-10-CM | POA: Diagnosis not present

## 2019-06-09 DIAGNOSIS — Z48812 Encounter for surgical aftercare following surgery on the circulatory system: Secondary | ICD-10-CM | POA: Diagnosis not present

## 2019-06-09 DIAGNOSIS — F4321 Adjustment disorder with depressed mood: Secondary | ICD-10-CM | POA: Diagnosis not present

## 2019-06-09 DIAGNOSIS — Z4781 Encounter for orthopedic aftercare following surgical amputation: Secondary | ICD-10-CM | POA: Diagnosis not present

## 2019-06-09 DIAGNOSIS — Z89412 Acquired absence of left great toe: Secondary | ICD-10-CM | POA: Diagnosis not present

## 2019-06-09 DIAGNOSIS — Z940 Kidney transplant status: Secondary | ICD-10-CM | POA: Diagnosis not present

## 2019-06-09 DIAGNOSIS — Z4801 Encounter for change or removal of surgical wound dressing: Secondary | ICD-10-CM | POA: Diagnosis not present

## 2019-06-09 DIAGNOSIS — Z794 Long term (current) use of insulin: Secondary | ICD-10-CM | POA: Diagnosis not present

## 2019-06-09 DIAGNOSIS — I70202 Unspecified atherosclerosis of native arteries of extremities, left leg: Secondary | ICD-10-CM | POA: Diagnosis not present

## 2019-06-09 DIAGNOSIS — Z992 Dependence on renal dialysis: Secondary | ICD-10-CM | POA: Diagnosis not present

## 2019-06-09 DIAGNOSIS — E1151 Type 2 diabetes mellitus with diabetic peripheral angiopathy without gangrene: Secondary | ICD-10-CM | POA: Diagnosis not present

## 2019-06-09 DIAGNOSIS — N186 End stage renal disease: Secondary | ICD-10-CM | POA: Diagnosis not present

## 2019-06-09 DIAGNOSIS — E1165 Type 2 diabetes mellitus with hyperglycemia: Secondary | ICD-10-CM | POA: Diagnosis not present

## 2019-06-12 DIAGNOSIS — E1151 Type 2 diabetes mellitus with diabetic peripheral angiopathy without gangrene: Secondary | ICD-10-CM | POA: Diagnosis not present

## 2019-06-12 DIAGNOSIS — Z48812 Encounter for surgical aftercare following surgery on the circulatory system: Secondary | ICD-10-CM | POA: Diagnosis not present

## 2019-06-12 DIAGNOSIS — M86672 Other chronic osteomyelitis, left ankle and foot: Secondary | ICD-10-CM | POA: Diagnosis not present

## 2019-06-12 DIAGNOSIS — Z4781 Encounter for orthopedic aftercare following surgical amputation: Secondary | ICD-10-CM | POA: Diagnosis not present

## 2019-06-12 DIAGNOSIS — Z89412 Acquired absence of left great toe: Secondary | ICD-10-CM | POA: Diagnosis not present

## 2019-06-12 DIAGNOSIS — Z4801 Encounter for change or removal of surgical wound dressing: Secondary | ICD-10-CM | POA: Diagnosis not present

## 2019-06-12 DIAGNOSIS — I70202 Unspecified atherosclerosis of native arteries of extremities, left leg: Secondary | ICD-10-CM | POA: Diagnosis not present

## 2019-06-13 DIAGNOSIS — Z940 Kidney transplant status: Secondary | ICD-10-CM | POA: Diagnosis not present

## 2019-06-14 DIAGNOSIS — Z89412 Acquired absence of left great toe: Secondary | ICD-10-CM | POA: Diagnosis not present

## 2019-06-14 DIAGNOSIS — Z4801 Encounter for change or removal of surgical wound dressing: Secondary | ICD-10-CM | POA: Diagnosis not present

## 2019-06-14 DIAGNOSIS — Z4781 Encounter for orthopedic aftercare following surgical amputation: Secondary | ICD-10-CM | POA: Diagnosis not present

## 2019-06-14 DIAGNOSIS — Z48812 Encounter for surgical aftercare following surgery on the circulatory system: Secondary | ICD-10-CM | POA: Diagnosis not present

## 2019-06-14 DIAGNOSIS — I70202 Unspecified atherosclerosis of native arteries of extremities, left leg: Secondary | ICD-10-CM | POA: Diagnosis not present

## 2019-06-14 DIAGNOSIS — E1151 Type 2 diabetes mellitus with diabetic peripheral angiopathy without gangrene: Secondary | ICD-10-CM | POA: Diagnosis not present

## 2019-06-16 DIAGNOSIS — Z4781 Encounter for orthopedic aftercare following surgical amputation: Secondary | ICD-10-CM | POA: Diagnosis not present

## 2019-06-16 DIAGNOSIS — Z48812 Encounter for surgical aftercare following surgery on the circulatory system: Secondary | ICD-10-CM | POA: Diagnosis not present

## 2019-06-16 DIAGNOSIS — I70202 Unspecified atherosclerosis of native arteries of extremities, left leg: Secondary | ICD-10-CM | POA: Diagnosis not present

## 2019-06-16 DIAGNOSIS — E1151 Type 2 diabetes mellitus with diabetic peripheral angiopathy without gangrene: Secondary | ICD-10-CM | POA: Diagnosis not present

## 2019-06-16 DIAGNOSIS — Z4801 Encounter for change or removal of surgical wound dressing: Secondary | ICD-10-CM | POA: Diagnosis not present

## 2019-06-16 DIAGNOSIS — Z89412 Acquired absence of left great toe: Secondary | ICD-10-CM | POA: Diagnosis not present

## 2019-06-19 DIAGNOSIS — E118 Type 2 diabetes mellitus with unspecified complications: Secondary | ICD-10-CM | POA: Diagnosis not present

## 2019-06-19 DIAGNOSIS — I70202 Unspecified atherosclerosis of native arteries of extremities, left leg: Secondary | ICD-10-CM | POA: Diagnosis not present

## 2019-06-19 DIAGNOSIS — G8918 Other acute postprocedural pain: Secondary | ICD-10-CM | POA: Diagnosis not present

## 2019-06-19 DIAGNOSIS — Z79899 Other long term (current) drug therapy: Secondary | ICD-10-CM | POA: Diagnosis not present

## 2019-06-19 DIAGNOSIS — Z4781 Encounter for orthopedic aftercare following surgical amputation: Secondary | ICD-10-CM | POA: Diagnosis not present

## 2019-06-19 DIAGNOSIS — I422 Other hypertrophic cardiomyopathy: Secondary | ICD-10-CM | POA: Diagnosis present

## 2019-06-19 DIAGNOSIS — Z7902 Long term (current) use of antithrombotics/antiplatelets: Secondary | ICD-10-CM | POA: Diagnosis not present

## 2019-06-19 DIAGNOSIS — R441 Visual hallucinations: Secondary | ICD-10-CM | POA: Diagnosis not present

## 2019-06-19 DIAGNOSIS — Z9862 Peripheral vascular angioplasty status: Secondary | ICD-10-CM | POA: Diagnosis not present

## 2019-06-19 DIAGNOSIS — Z20822 Contact with and (suspected) exposure to covid-19: Secondary | ICD-10-CM | POA: Diagnosis present

## 2019-06-19 DIAGNOSIS — E1151 Type 2 diabetes mellitus with diabetic peripheral angiopathy without gangrene: Secondary | ICD-10-CM | POA: Diagnosis not present

## 2019-06-19 DIAGNOSIS — Z940 Kidney transplant status: Secondary | ICD-10-CM | POA: Diagnosis not present

## 2019-06-19 DIAGNOSIS — I96 Gangrene, not elsewhere classified: Secondary | ICD-10-CM | POA: Diagnosis not present

## 2019-06-19 DIAGNOSIS — Z4801 Encounter for change or removal of surgical wound dressing: Secondary | ICD-10-CM | POA: Diagnosis not present

## 2019-06-19 DIAGNOSIS — E782 Mixed hyperlipidemia: Secondary | ICD-10-CM | POA: Diagnosis present

## 2019-06-19 DIAGNOSIS — Z7952 Long term (current) use of systemic steroids: Secondary | ICD-10-CM | POA: Diagnosis not present

## 2019-06-19 DIAGNOSIS — Z794 Long term (current) use of insulin: Secondary | ICD-10-CM | POA: Diagnosis not present

## 2019-06-19 DIAGNOSIS — Z7982 Long term (current) use of aspirin: Secondary | ICD-10-CM | POA: Diagnosis not present

## 2019-06-19 DIAGNOSIS — E1152 Type 2 diabetes mellitus with diabetic peripheral angiopathy with gangrene: Secondary | ICD-10-CM | POA: Diagnosis not present

## 2019-06-19 DIAGNOSIS — E1165 Type 2 diabetes mellitus with hyperglycemia: Secondary | ICD-10-CM | POA: Diagnosis not present

## 2019-06-19 DIAGNOSIS — I10 Essential (primary) hypertension: Secondary | ICD-10-CM | POA: Diagnosis not present

## 2019-06-19 DIAGNOSIS — R440 Auditory hallucinations: Secondary | ICD-10-CM | POA: Diagnosis not present

## 2019-06-19 DIAGNOSIS — I739 Peripheral vascular disease, unspecified: Secondary | ICD-10-CM | POA: Diagnosis not present

## 2019-06-19 DIAGNOSIS — T40605A Adverse effect of unspecified narcotics, initial encounter: Secondary | ICD-10-CM | POA: Diagnosis not present

## 2019-06-19 DIAGNOSIS — Z89412 Acquired absence of left great toe: Secondary | ICD-10-CM | POA: Diagnosis not present

## 2019-06-19 DIAGNOSIS — B460 Pulmonary mucormycosis: Secondary | ICD-10-CM | POA: Diagnosis not present

## 2019-06-19 DIAGNOSIS — E785 Hyperlipidemia, unspecified: Secondary | ICD-10-CM | POA: Diagnosis not present

## 2019-06-19 DIAGNOSIS — D84821 Immunodeficiency due to drugs: Secondary | ICD-10-CM | POA: Diagnosis present

## 2019-06-19 DIAGNOSIS — T8754 Necrosis of amputation stump, left lower extremity: Secondary | ICD-10-CM | POA: Diagnosis not present

## 2019-06-19 DIAGNOSIS — I70262 Atherosclerosis of native arteries of extremities with gangrene, left leg: Secondary | ICD-10-CM | POA: Diagnosis present

## 2019-06-19 DIAGNOSIS — Z902 Acquired absence of lung [part of]: Secondary | ICD-10-CM | POA: Diagnosis not present

## 2019-06-19 DIAGNOSIS — F4321 Adjustment disorder with depressed mood: Secondary | ICD-10-CM | POA: Diagnosis present

## 2019-06-19 DIAGNOSIS — Z48812 Encounter for surgical aftercare following surgery on the circulatory system: Secondary | ICD-10-CM | POA: Diagnosis not present

## 2019-06-19 DIAGNOSIS — E1121 Type 2 diabetes mellitus with diabetic nephropathy: Secondary | ICD-10-CM | POA: Diagnosis present

## 2019-06-25 MED ORDER — VALGANCICLOVIR HCL 450 MG PO TABS
450.00 | ORAL_TABLET | ORAL | Status: DC
Start: 2019-06-25 — End: 2019-06-25

## 2019-06-25 MED ORDER — GLUCAGON (RDNA) 1 MG IJ KIT
1.00 | PACK | INTRAMUSCULAR | Status: DC
Start: ? — End: 2019-06-25

## 2019-06-25 MED ORDER — ACETAMINOPHEN 325 MG PO TABS
650.00 | ORAL_TABLET | ORAL | Status: DC
Start: 2019-06-25 — End: 2019-06-25

## 2019-06-25 MED ORDER — INSULIN GLARGINE 100 UNIT/ML ~~LOC~~ SOLN
12.00 | SUBCUTANEOUS | Status: DC
Start: 2019-06-24 — End: 2019-06-25

## 2019-06-25 MED ORDER — DEXTROSE 50 % IV SOLN
12.50 | INTRAVENOUS | Status: DC
Start: ? — End: 2019-06-25

## 2019-06-25 MED ORDER — HYDROMORPHONE HCL 1 MG/ML IJ SOLN
0.50 | INTRAMUSCULAR | Status: DC
Start: ? — End: 2019-06-25

## 2019-06-25 MED ORDER — INSULIN LISPRO 100 UNIT/ML ~~LOC~~ SOLN
0.00 | SUBCUTANEOUS | Status: DC
Start: 2019-06-25 — End: 2019-06-25

## 2019-06-25 MED ORDER — INSULIN LISPRO 100 UNIT/ML ~~LOC~~ SOLN
8.00 | SUBCUTANEOUS | Status: DC
Start: 2019-06-25 — End: 2019-06-25

## 2019-06-25 MED ORDER — HEPARIN SODIUM (PORCINE) 5000 UNIT/ML IJ SOLN
5000.00 | INTRAMUSCULAR | Status: DC
Start: 2019-06-25 — End: 2019-06-25

## 2019-06-25 MED ORDER — TACROLIMUS ER 0.75 MG PO TB24
2.25 | ORAL_TABLET | ORAL | Status: DC
Start: 2019-06-25 — End: 2019-06-25

## 2019-06-25 MED ORDER — GABAPENTIN 100 MG PO CAPS
200.00 | ORAL_CAPSULE | ORAL | Status: DC
Start: 2019-06-24 — End: 2019-06-25

## 2019-06-25 MED ORDER — DSS 100 MG PO CAPS
100.00 | ORAL_CAPSULE | ORAL | Status: DC
Start: 2019-06-25 — End: 2019-06-25

## 2019-06-25 MED ORDER — INSULIN LISPRO 100 UNIT/ML ~~LOC~~ SOLN
5.00 | SUBCUTANEOUS | Status: DC
Start: 2019-06-25 — End: 2019-06-25

## 2019-06-25 MED ORDER — FAMOTIDINE 20 MG PO TABS
20.00 | ORAL_TABLET | ORAL | Status: DC
Start: 2019-06-24 — End: 2019-06-25

## 2019-06-25 MED ORDER — CLOPIDOGREL BISULFATE 75 MG PO TABS
75.00 | ORAL_TABLET | ORAL | Status: DC
Start: 2019-06-25 — End: 2019-06-25

## 2019-06-25 MED ORDER — ERTAPENEM SODIUM 1 G IJ SOLR
1.00 | INTRAMUSCULAR | Status: DC
Start: 2019-06-25 — End: 2019-06-25

## 2019-06-25 MED ORDER — TORSEMIDE 10 MG PO TABS
10.00 | ORAL_TABLET | ORAL | Status: DC
Start: 2019-06-26 — End: 2019-06-25

## 2019-06-25 MED ORDER — MYCOPHENOLATE MOFETIL 250 MG PO CAPS
500.00 | ORAL_CAPSULE | ORAL | Status: DC
Start: 2019-06-24 — End: 2019-06-25

## 2019-06-25 MED ORDER — ASPIRIN 81 MG PO TBEC
81.00 | DELAYED_RELEASE_TABLET | ORAL | Status: DC
Start: 2019-06-25 — End: 2019-06-25

## 2019-06-25 MED ORDER — OXYCODONE HCL 5 MG PO TABS
5.00 | ORAL_TABLET | ORAL | Status: DC
Start: ? — End: 2019-06-25

## 2019-06-25 MED ORDER — SERTRALINE HCL 100 MG PO TABS
50.00 | ORAL_TABLET | ORAL | Status: DC
Start: 2019-06-25 — End: 2019-06-25

## 2019-06-25 MED ORDER — LIDOCAINE HCL 1 % IJ SOLN
0.50 | INTRAMUSCULAR | Status: DC
Start: ? — End: 2019-06-25

## 2019-06-25 MED ORDER — PREDNISONE 5 MG PO TABS
5.00 | ORAL_TABLET | ORAL | Status: DC
Start: 2019-06-25 — End: 2019-06-25

## 2019-06-25 MED ORDER — ATORVASTATIN CALCIUM 40 MG PO TABS
40.00 | ORAL_TABLET | ORAL | Status: DC
Start: 2019-06-25 — End: 2019-06-25

## 2019-06-27 DIAGNOSIS — E1151 Type 2 diabetes mellitus with diabetic peripheral angiopathy without gangrene: Secondary | ICD-10-CM | POA: Diagnosis not present

## 2019-06-27 DIAGNOSIS — Z89412 Acquired absence of left great toe: Secondary | ICD-10-CM | POA: Diagnosis not present

## 2019-06-27 DIAGNOSIS — Z23 Encounter for immunization: Secondary | ICD-10-CM | POA: Diagnosis not present

## 2019-06-27 DIAGNOSIS — Z4781 Encounter for orthopedic aftercare following surgical amputation: Secondary | ICD-10-CM | POA: Diagnosis not present

## 2019-06-27 DIAGNOSIS — Z48812 Encounter for surgical aftercare following surgery on the circulatory system: Secondary | ICD-10-CM | POA: Diagnosis not present

## 2019-06-27 DIAGNOSIS — I70202 Unspecified atherosclerosis of native arteries of extremities, left leg: Secondary | ICD-10-CM | POA: Diagnosis not present

## 2019-06-27 DIAGNOSIS — Z4801 Encounter for change or removal of surgical wound dressing: Secondary | ICD-10-CM | POA: Diagnosis not present

## 2019-06-29 DIAGNOSIS — E1151 Type 2 diabetes mellitus with diabetic peripheral angiopathy without gangrene: Secondary | ICD-10-CM | POA: Diagnosis not present

## 2019-06-29 DIAGNOSIS — Z4781 Encounter for orthopedic aftercare following surgical amputation: Secondary | ICD-10-CM | POA: Diagnosis not present

## 2019-06-29 DIAGNOSIS — Z89412 Acquired absence of left great toe: Secondary | ICD-10-CM | POA: Diagnosis not present

## 2019-06-29 DIAGNOSIS — Z48812 Encounter for surgical aftercare following surgery on the circulatory system: Secondary | ICD-10-CM | POA: Diagnosis not present

## 2019-06-29 DIAGNOSIS — I70202 Unspecified atherosclerosis of native arteries of extremities, left leg: Secondary | ICD-10-CM | POA: Diagnosis not present

## 2019-06-29 DIAGNOSIS — Z4801 Encounter for change or removal of surgical wound dressing: Secondary | ICD-10-CM | POA: Diagnosis not present

## 2019-06-30 DIAGNOSIS — I70202 Unspecified atherosclerosis of native arteries of extremities, left leg: Secondary | ICD-10-CM | POA: Diagnosis not present

## 2019-06-30 DIAGNOSIS — Z89412 Acquired absence of left great toe: Secondary | ICD-10-CM | POA: Diagnosis not present

## 2019-06-30 DIAGNOSIS — Z48812 Encounter for surgical aftercare following surgery on the circulatory system: Secondary | ICD-10-CM | POA: Diagnosis not present

## 2019-06-30 DIAGNOSIS — Z4801 Encounter for change or removal of surgical wound dressing: Secondary | ICD-10-CM | POA: Diagnosis not present

## 2019-06-30 DIAGNOSIS — Z4781 Encounter for orthopedic aftercare following surgical amputation: Secondary | ICD-10-CM | POA: Diagnosis not present

## 2019-06-30 DIAGNOSIS — E1151 Type 2 diabetes mellitus with diabetic peripheral angiopathy without gangrene: Secondary | ICD-10-CM | POA: Diagnosis not present

## 2019-07-01 DIAGNOSIS — Z48812 Encounter for surgical aftercare following surgery on the circulatory system: Secondary | ICD-10-CM | POA: Diagnosis not present

## 2019-07-01 DIAGNOSIS — Z4781 Encounter for orthopedic aftercare following surgical amputation: Secondary | ICD-10-CM | POA: Diagnosis not present

## 2019-07-01 DIAGNOSIS — Z89412 Acquired absence of left great toe: Secondary | ICD-10-CM | POA: Diagnosis not present

## 2019-07-01 DIAGNOSIS — I70202 Unspecified atherosclerosis of native arteries of extremities, left leg: Secondary | ICD-10-CM | POA: Diagnosis not present

## 2019-07-01 DIAGNOSIS — E1151 Type 2 diabetes mellitus with diabetic peripheral angiopathy without gangrene: Secondary | ICD-10-CM | POA: Diagnosis not present

## 2019-07-01 DIAGNOSIS — Z4801 Encounter for change or removal of surgical wound dressing: Secondary | ICD-10-CM | POA: Diagnosis not present

## 2019-07-03 DIAGNOSIS — Z4801 Encounter for change or removal of surgical wound dressing: Secondary | ICD-10-CM | POA: Diagnosis not present

## 2019-07-03 DIAGNOSIS — E1151 Type 2 diabetes mellitus with diabetic peripheral angiopathy without gangrene: Secondary | ICD-10-CM | POA: Diagnosis not present

## 2019-07-03 DIAGNOSIS — I70202 Unspecified atherosclerosis of native arteries of extremities, left leg: Secondary | ICD-10-CM | POA: Diagnosis not present

## 2019-07-03 DIAGNOSIS — Z89412 Acquired absence of left great toe: Secondary | ICD-10-CM | POA: Diagnosis not present

## 2019-07-03 DIAGNOSIS — Z4781 Encounter for orthopedic aftercare following surgical amputation: Secondary | ICD-10-CM | POA: Diagnosis not present

## 2019-07-03 DIAGNOSIS — Z48812 Encounter for surgical aftercare following surgery on the circulatory system: Secondary | ICD-10-CM | POA: Diagnosis not present

## 2019-07-04 DIAGNOSIS — Z792 Long term (current) use of antibiotics: Secondary | ICD-10-CM | POA: Diagnosis not present

## 2019-07-04 DIAGNOSIS — Z940 Kidney transplant status: Secondary | ICD-10-CM | POA: Diagnosis not present

## 2019-07-04 DIAGNOSIS — I96 Gangrene, not elsewhere classified: Secondary | ICD-10-CM | POA: Diagnosis not present

## 2019-07-04 DIAGNOSIS — Z452 Encounter for adjustment and management of vascular access device: Secondary | ICD-10-CM | POA: Diagnosis not present

## 2019-07-05 DIAGNOSIS — Z4801 Encounter for change or removal of surgical wound dressing: Secondary | ICD-10-CM | POA: Diagnosis not present

## 2019-07-05 DIAGNOSIS — E1151 Type 2 diabetes mellitus with diabetic peripheral angiopathy without gangrene: Secondary | ICD-10-CM | POA: Diagnosis not present

## 2019-07-05 DIAGNOSIS — Z89412 Acquired absence of left great toe: Secondary | ICD-10-CM | POA: Diagnosis not present

## 2019-07-05 DIAGNOSIS — I70202 Unspecified atherosclerosis of native arteries of extremities, left leg: Secondary | ICD-10-CM | POA: Diagnosis not present

## 2019-07-05 DIAGNOSIS — Z48812 Encounter for surgical aftercare following surgery on the circulatory system: Secondary | ICD-10-CM | POA: Diagnosis not present

## 2019-07-05 DIAGNOSIS — Z4781 Encounter for orthopedic aftercare following surgical amputation: Secondary | ICD-10-CM | POA: Diagnosis not present

## 2019-07-06 DIAGNOSIS — I96 Gangrene, not elsewhere classified: Secondary | ICD-10-CM | POA: Diagnosis not present

## 2019-07-06 DIAGNOSIS — I739 Peripheral vascular disease, unspecified: Secondary | ICD-10-CM | POA: Diagnosis not present

## 2019-07-07 DIAGNOSIS — Z48812 Encounter for surgical aftercare following surgery on the circulatory system: Secondary | ICD-10-CM | POA: Diagnosis not present

## 2019-07-07 DIAGNOSIS — Z4781 Encounter for orthopedic aftercare following surgical amputation: Secondary | ICD-10-CM | POA: Diagnosis not present

## 2019-07-07 DIAGNOSIS — Z89412 Acquired absence of left great toe: Secondary | ICD-10-CM | POA: Diagnosis not present

## 2019-07-07 DIAGNOSIS — I70202 Unspecified atherosclerosis of native arteries of extremities, left leg: Secondary | ICD-10-CM | POA: Diagnosis not present

## 2019-07-07 DIAGNOSIS — Z4801 Encounter for change or removal of surgical wound dressing: Secondary | ICD-10-CM | POA: Diagnosis not present

## 2019-07-07 DIAGNOSIS — E1151 Type 2 diabetes mellitus with diabetic peripheral angiopathy without gangrene: Secondary | ICD-10-CM | POA: Diagnosis not present

## 2019-07-09 DIAGNOSIS — T8754 Necrosis of amputation stump, left lower extremity: Secondary | ICD-10-CM | POA: Diagnosis not present

## 2019-07-09 DIAGNOSIS — E782 Mixed hyperlipidemia: Secondary | ICD-10-CM | POA: Diagnosis not present

## 2019-07-09 DIAGNOSIS — N186 End stage renal disease: Secondary | ICD-10-CM | POA: Diagnosis not present

## 2019-07-09 DIAGNOSIS — I70202 Unspecified atherosclerosis of native arteries of extremities, left leg: Secondary | ICD-10-CM | POA: Diagnosis not present

## 2019-07-09 DIAGNOSIS — Z992 Dependence on renal dialysis: Secondary | ICD-10-CM | POA: Diagnosis not present

## 2019-07-09 DIAGNOSIS — D631 Anemia in chronic kidney disease: Secondary | ICD-10-CM | POA: Diagnosis not present

## 2019-07-09 DIAGNOSIS — Z79891 Long term (current) use of opiate analgesic: Secondary | ICD-10-CM | POA: Diagnosis not present

## 2019-07-09 DIAGNOSIS — E1165 Type 2 diabetes mellitus with hyperglycemia: Secondary | ICD-10-CM | POA: Diagnosis not present

## 2019-07-09 DIAGNOSIS — J90 Pleural effusion, not elsewhere classified: Secondary | ICD-10-CM | POA: Diagnosis not present

## 2019-07-09 DIAGNOSIS — E1122 Type 2 diabetes mellitus with diabetic chronic kidney disease: Secondary | ICD-10-CM | POA: Diagnosis not present

## 2019-07-09 DIAGNOSIS — I120 Hypertensive chronic kidney disease with stage 5 chronic kidney disease or end stage renal disease: Secondary | ICD-10-CM | POA: Diagnosis not present

## 2019-07-09 DIAGNOSIS — F4321 Adjustment disorder with depressed mood: Secondary | ICD-10-CM | POA: Diagnosis not present

## 2019-07-09 DIAGNOSIS — Z7902 Long term (current) use of antithrombotics/antiplatelets: Secondary | ICD-10-CM | POA: Diagnosis not present

## 2019-07-09 DIAGNOSIS — Z792 Long term (current) use of antibiotics: Secondary | ICD-10-CM | POA: Diagnosis not present

## 2019-07-09 DIAGNOSIS — E1151 Type 2 diabetes mellitus with diabetic peripheral angiopathy without gangrene: Secondary | ICD-10-CM | POA: Diagnosis not present

## 2019-07-09 DIAGNOSIS — Z794 Long term (current) use of insulin: Secondary | ICD-10-CM | POA: Diagnosis not present

## 2019-07-09 DIAGNOSIS — Z452 Encounter for adjustment and management of vascular access device: Secondary | ICD-10-CM | POA: Diagnosis not present

## 2019-07-09 DIAGNOSIS — Z902 Acquired absence of lung [part of]: Secondary | ICD-10-CM | POA: Diagnosis not present

## 2019-07-09 DIAGNOSIS — Z5181 Encounter for therapeutic drug level monitoring: Secondary | ICD-10-CM | POA: Diagnosis not present

## 2019-07-09 DIAGNOSIS — Z940 Kidney transplant status: Secondary | ICD-10-CM | POA: Diagnosis not present

## 2019-07-10 DIAGNOSIS — T8754 Necrosis of amputation stump, left lower extremity: Secondary | ICD-10-CM | POA: Diagnosis not present

## 2019-07-10 DIAGNOSIS — E1122 Type 2 diabetes mellitus with diabetic chronic kidney disease: Secondary | ICD-10-CM | POA: Diagnosis not present

## 2019-07-10 DIAGNOSIS — I70202 Unspecified atherosclerosis of native arteries of extremities, left leg: Secondary | ICD-10-CM | POA: Diagnosis not present

## 2019-07-10 DIAGNOSIS — E1165 Type 2 diabetes mellitus with hyperglycemia: Secondary | ICD-10-CM | POA: Diagnosis not present

## 2019-07-10 DIAGNOSIS — E1151 Type 2 diabetes mellitus with diabetic peripheral angiopathy without gangrene: Secondary | ICD-10-CM | POA: Diagnosis not present

## 2019-07-10 DIAGNOSIS — I120 Hypertensive chronic kidney disease with stage 5 chronic kidney disease or end stage renal disease: Secondary | ICD-10-CM | POA: Diagnosis not present

## 2019-07-12 DIAGNOSIS — E1122 Type 2 diabetes mellitus with diabetic chronic kidney disease: Secondary | ICD-10-CM | POA: Diagnosis not present

## 2019-07-12 DIAGNOSIS — I120 Hypertensive chronic kidney disease with stage 5 chronic kidney disease or end stage renal disease: Secondary | ICD-10-CM | POA: Diagnosis not present

## 2019-07-12 DIAGNOSIS — E1151 Type 2 diabetes mellitus with diabetic peripheral angiopathy without gangrene: Secondary | ICD-10-CM | POA: Diagnosis not present

## 2019-07-12 DIAGNOSIS — R634 Abnormal weight loss: Secondary | ICD-10-CM | POA: Diagnosis not present

## 2019-07-12 DIAGNOSIS — I70262 Atherosclerosis of native arteries of extremities with gangrene, left leg: Secondary | ICD-10-CM | POA: Diagnosis not present

## 2019-07-12 DIAGNOSIS — D849 Immunodeficiency, unspecified: Secondary | ICD-10-CM | POA: Diagnosis not present

## 2019-07-12 DIAGNOSIS — Z0389 Encounter for observation for other suspected diseases and conditions ruled out: Secondary | ICD-10-CM | POA: Diagnosis not present

## 2019-07-12 DIAGNOSIS — E1165 Type 2 diabetes mellitus with hyperglycemia: Secondary | ICD-10-CM | POA: Diagnosis not present

## 2019-07-12 DIAGNOSIS — I739 Peripheral vascular disease, unspecified: Secondary | ICD-10-CM | POA: Diagnosis not present

## 2019-07-12 DIAGNOSIS — B465 Mucormycosis, unspecified: Secondary | ICD-10-CM | POA: Diagnosis not present

## 2019-07-12 DIAGNOSIS — T8754 Necrosis of amputation stump, left lower extremity: Secondary | ICD-10-CM | POA: Diagnosis not present

## 2019-07-12 DIAGNOSIS — B259 Cytomegaloviral disease, unspecified: Secondary | ICD-10-CM | POA: Diagnosis not present

## 2019-07-12 DIAGNOSIS — I70202 Unspecified atherosclerosis of native arteries of extremities, left leg: Secondary | ICD-10-CM | POA: Diagnosis not present

## 2019-07-12 DIAGNOSIS — Z940 Kidney transplant status: Secondary | ICD-10-CM | POA: Diagnosis not present

## 2019-07-14 DIAGNOSIS — E1151 Type 2 diabetes mellitus with diabetic peripheral angiopathy without gangrene: Secondary | ICD-10-CM | POA: Diagnosis not present

## 2019-07-14 DIAGNOSIS — E1122 Type 2 diabetes mellitus with diabetic chronic kidney disease: Secondary | ICD-10-CM | POA: Diagnosis not present

## 2019-07-14 DIAGNOSIS — I70202 Unspecified atherosclerosis of native arteries of extremities, left leg: Secondary | ICD-10-CM | POA: Diagnosis not present

## 2019-07-14 DIAGNOSIS — I120 Hypertensive chronic kidney disease with stage 5 chronic kidney disease or end stage renal disease: Secondary | ICD-10-CM | POA: Diagnosis not present

## 2019-07-14 DIAGNOSIS — Z940 Kidney transplant status: Secondary | ICD-10-CM | POA: Diagnosis not present

## 2019-07-14 DIAGNOSIS — Z944 Liver transplant status: Secondary | ICD-10-CM | POA: Diagnosis not present

## 2019-07-14 DIAGNOSIS — T8754 Necrosis of amputation stump, left lower extremity: Secondary | ICD-10-CM | POA: Diagnosis not present

## 2019-07-14 DIAGNOSIS — E1165 Type 2 diabetes mellitus with hyperglycemia: Secondary | ICD-10-CM | POA: Diagnosis not present

## 2019-07-17 DIAGNOSIS — E11621 Type 2 diabetes mellitus with foot ulcer: Secondary | ICD-10-CM | POA: Diagnosis not present

## 2019-07-17 DIAGNOSIS — Z794 Long term (current) use of insulin: Secondary | ICD-10-CM | POA: Diagnosis not present

## 2019-07-17 DIAGNOSIS — Z940 Kidney transplant status: Secondary | ICD-10-CM | POA: Diagnosis not present

## 2019-07-17 DIAGNOSIS — L97529 Non-pressure chronic ulcer of other part of left foot with unspecified severity: Secondary | ICD-10-CM | POA: Diagnosis not present

## 2019-07-17 DIAGNOSIS — E1165 Type 2 diabetes mellitus with hyperglycemia: Secondary | ICD-10-CM | POA: Diagnosis not present

## 2019-07-17 DIAGNOSIS — E1151 Type 2 diabetes mellitus with diabetic peripheral angiopathy without gangrene: Secondary | ICD-10-CM | POA: Diagnosis not present

## 2019-07-17 DIAGNOSIS — E1169 Type 2 diabetes mellitus with other specified complication: Secondary | ICD-10-CM | POA: Diagnosis not present

## 2019-07-17 DIAGNOSIS — I120 Hypertensive chronic kidney disease with stage 5 chronic kidney disease or end stage renal disease: Secondary | ICD-10-CM | POA: Diagnosis not present

## 2019-07-17 DIAGNOSIS — T8754 Necrosis of amputation stump, left lower extremity: Secondary | ICD-10-CM | POA: Diagnosis not present

## 2019-07-17 DIAGNOSIS — Z5181 Encounter for therapeutic drug level monitoring: Secondary | ICD-10-CM | POA: Diagnosis not present

## 2019-07-17 DIAGNOSIS — I70202 Unspecified atherosclerosis of native arteries of extremities, left leg: Secondary | ICD-10-CM | POA: Diagnosis not present

## 2019-07-17 DIAGNOSIS — E1122 Type 2 diabetes mellitus with diabetic chronic kidney disease: Secondary | ICD-10-CM | POA: Diagnosis not present

## 2019-07-19 DIAGNOSIS — E1165 Type 2 diabetes mellitus with hyperglycemia: Secondary | ICD-10-CM | POA: Diagnosis not present

## 2019-07-19 DIAGNOSIS — T8754 Necrosis of amputation stump, left lower extremity: Secondary | ICD-10-CM | POA: Diagnosis not present

## 2019-07-19 DIAGNOSIS — E1122 Type 2 diabetes mellitus with diabetic chronic kidney disease: Secondary | ICD-10-CM | POA: Diagnosis not present

## 2019-07-19 DIAGNOSIS — I70202 Unspecified atherosclerosis of native arteries of extremities, left leg: Secondary | ICD-10-CM | POA: Diagnosis not present

## 2019-07-19 DIAGNOSIS — I120 Hypertensive chronic kidney disease with stage 5 chronic kidney disease or end stage renal disease: Secondary | ICD-10-CM | POA: Diagnosis not present

## 2019-07-19 DIAGNOSIS — E1151 Type 2 diabetes mellitus with diabetic peripheral angiopathy without gangrene: Secondary | ICD-10-CM | POA: Diagnosis not present

## 2019-07-21 DIAGNOSIS — E1122 Type 2 diabetes mellitus with diabetic chronic kidney disease: Secondary | ICD-10-CM | POA: Diagnosis not present

## 2019-07-21 DIAGNOSIS — E1165 Type 2 diabetes mellitus with hyperglycemia: Secondary | ICD-10-CM | POA: Diagnosis not present

## 2019-07-21 DIAGNOSIS — E1151 Type 2 diabetes mellitus with diabetic peripheral angiopathy without gangrene: Secondary | ICD-10-CM | POA: Diagnosis not present

## 2019-07-21 DIAGNOSIS — I70202 Unspecified atherosclerosis of native arteries of extremities, left leg: Secondary | ICD-10-CM | POA: Diagnosis not present

## 2019-07-21 DIAGNOSIS — I120 Hypertensive chronic kidney disease with stage 5 chronic kidney disease or end stage renal disease: Secondary | ICD-10-CM | POA: Diagnosis not present

## 2019-07-21 DIAGNOSIS — T8754 Necrosis of amputation stump, left lower extremity: Secondary | ICD-10-CM | POA: Diagnosis not present

## 2019-07-24 DIAGNOSIS — I120 Hypertensive chronic kidney disease with stage 5 chronic kidney disease or end stage renal disease: Secondary | ICD-10-CM | POA: Diagnosis not present

## 2019-07-24 DIAGNOSIS — E1151 Type 2 diabetes mellitus with diabetic peripheral angiopathy without gangrene: Secondary | ICD-10-CM | POA: Diagnosis not present

## 2019-07-24 DIAGNOSIS — E1122 Type 2 diabetes mellitus with diabetic chronic kidney disease: Secondary | ICD-10-CM | POA: Diagnosis not present

## 2019-07-24 DIAGNOSIS — T8754 Necrosis of amputation stump, left lower extremity: Secondary | ICD-10-CM | POA: Diagnosis not present

## 2019-07-24 DIAGNOSIS — I70202 Unspecified atherosclerosis of native arteries of extremities, left leg: Secondary | ICD-10-CM | POA: Diagnosis not present

## 2019-07-24 DIAGNOSIS — E1165 Type 2 diabetes mellitus with hyperglycemia: Secondary | ICD-10-CM | POA: Diagnosis not present

## 2019-07-25 DIAGNOSIS — Z23 Encounter for immunization: Secondary | ICD-10-CM | POA: Diagnosis not present

## 2019-07-26 DIAGNOSIS — E1122 Type 2 diabetes mellitus with diabetic chronic kidney disease: Secondary | ICD-10-CM | POA: Diagnosis not present

## 2019-07-26 DIAGNOSIS — T8754 Necrosis of amputation stump, left lower extremity: Secondary | ICD-10-CM | POA: Diagnosis not present

## 2019-07-26 DIAGNOSIS — E1151 Type 2 diabetes mellitus with diabetic peripheral angiopathy without gangrene: Secondary | ICD-10-CM | POA: Diagnosis not present

## 2019-07-26 DIAGNOSIS — Z940 Kidney transplant status: Secondary | ICD-10-CM | POA: Diagnosis not present

## 2019-07-26 DIAGNOSIS — I70202 Unspecified atherosclerosis of native arteries of extremities, left leg: Secondary | ICD-10-CM | POA: Diagnosis not present

## 2019-07-26 DIAGNOSIS — I120 Hypertensive chronic kidney disease with stage 5 chronic kidney disease or end stage renal disease: Secondary | ICD-10-CM | POA: Diagnosis not present

## 2019-07-26 DIAGNOSIS — E1165 Type 2 diabetes mellitus with hyperglycemia: Secondary | ICD-10-CM | POA: Diagnosis not present

## 2019-07-26 DIAGNOSIS — Z5181 Encounter for therapeutic drug level monitoring: Secondary | ICD-10-CM | POA: Diagnosis not present

## 2019-07-28 DIAGNOSIS — E1122 Type 2 diabetes mellitus with diabetic chronic kidney disease: Secondary | ICD-10-CM | POA: Diagnosis not present

## 2019-07-28 DIAGNOSIS — E1165 Type 2 diabetes mellitus with hyperglycemia: Secondary | ICD-10-CM | POA: Diagnosis not present

## 2019-07-28 DIAGNOSIS — T8754 Necrosis of amputation stump, left lower extremity: Secondary | ICD-10-CM | POA: Diagnosis not present

## 2019-07-28 DIAGNOSIS — I120 Hypertensive chronic kidney disease with stage 5 chronic kidney disease or end stage renal disease: Secondary | ICD-10-CM | POA: Diagnosis not present

## 2019-07-28 DIAGNOSIS — E1151 Type 2 diabetes mellitus with diabetic peripheral angiopathy without gangrene: Secondary | ICD-10-CM | POA: Diagnosis not present

## 2019-07-28 DIAGNOSIS — I70202 Unspecified atherosclerosis of native arteries of extremities, left leg: Secondary | ICD-10-CM | POA: Diagnosis not present

## 2019-07-29 DIAGNOSIS — Z20822 Contact with and (suspected) exposure to covid-19: Secondary | ICD-10-CM | POA: Diagnosis not present

## 2019-07-31 DIAGNOSIS — Z79899 Other long term (current) drug therapy: Secondary | ICD-10-CM | POA: Diagnosis not present

## 2019-07-31 DIAGNOSIS — I422 Other hypertrophic cardiomyopathy: Secondary | ICD-10-CM | POA: Diagnosis not present

## 2019-07-31 DIAGNOSIS — M86662 Other chronic osteomyelitis, left tibia and fibula: Secondary | ICD-10-CM | POA: Diagnosis not present

## 2019-07-31 DIAGNOSIS — T8754 Necrosis of amputation stump, left lower extremity: Secondary | ICD-10-CM | POA: Diagnosis not present

## 2019-07-31 DIAGNOSIS — Z119 Encounter for screening for infectious and parasitic diseases, unspecified: Secondary | ICD-10-CM | POA: Diagnosis not present

## 2019-07-31 DIAGNOSIS — D849 Immunodeficiency, unspecified: Secondary | ICD-10-CM | POA: Diagnosis not present

## 2019-07-31 DIAGNOSIS — D84821 Immunodeficiency due to drugs: Secondary | ICD-10-CM | POA: Diagnosis present

## 2019-07-31 DIAGNOSIS — Z20822 Contact with and (suspected) exposure to covid-19: Secondary | ICD-10-CM | POA: Diagnosis present

## 2019-07-31 DIAGNOSIS — Z940 Kidney transplant status: Secondary | ICD-10-CM | POA: Diagnosis not present

## 2019-07-31 DIAGNOSIS — E1152 Type 2 diabetes mellitus with diabetic peripheral angiopathy with gangrene: Secondary | ICD-10-CM | POA: Diagnosis not present

## 2019-07-31 DIAGNOSIS — I96 Gangrene, not elsewhere classified: Secondary | ICD-10-CM | POA: Diagnosis not present

## 2019-07-31 DIAGNOSIS — G546 Phantom limb syndrome with pain: Secondary | ICD-10-CM | POA: Diagnosis not present

## 2019-07-31 DIAGNOSIS — S88111A Complete traumatic amputation at level between knee and ankle, right lower leg, initial encounter: Secondary | ICD-10-CM | POA: Diagnosis not present

## 2019-07-31 DIAGNOSIS — G8918 Other acute postprocedural pain: Secondary | ICD-10-CM | POA: Diagnosis not present

## 2019-07-31 DIAGNOSIS — B460 Pulmonary mucormycosis: Secondary | ICD-10-CM | POA: Diagnosis not present

## 2019-07-31 DIAGNOSIS — B465 Mucormycosis, unspecified: Secondary | ICD-10-CM | POA: Diagnosis not present

## 2019-07-31 DIAGNOSIS — Z8619 Personal history of other infectious and parasitic diseases: Secondary | ICD-10-CM | POA: Diagnosis not present

## 2019-07-31 DIAGNOSIS — E1151 Type 2 diabetes mellitus with diabetic peripheral angiopathy without gangrene: Secondary | ICD-10-CM | POA: Diagnosis not present

## 2019-07-31 DIAGNOSIS — Z794 Long term (current) use of insulin: Secondary | ICD-10-CM | POA: Diagnosis not present

## 2019-07-31 DIAGNOSIS — E1165 Type 2 diabetes mellitus with hyperglycemia: Secondary | ICD-10-CM | POA: Diagnosis not present

## 2019-07-31 DIAGNOSIS — Z7902 Long term (current) use of antithrombotics/antiplatelets: Secondary | ICD-10-CM | POA: Diagnosis not present

## 2019-07-31 DIAGNOSIS — I10 Essential (primary) hypertension: Secondary | ICD-10-CM | POA: Diagnosis present

## 2019-07-31 DIAGNOSIS — E1122 Type 2 diabetes mellitus with diabetic chronic kidney disease: Secondary | ICD-10-CM | POA: Diagnosis present

## 2019-07-31 DIAGNOSIS — M8660 Other chronic osteomyelitis, unspecified site: Secondary | ICD-10-CM | POA: Diagnosis not present

## 2019-07-31 DIAGNOSIS — N1831 Chronic kidney disease, stage 3a: Secondary | ICD-10-CM | POA: Diagnosis not present

## 2019-07-31 DIAGNOSIS — E118 Type 2 diabetes mellitus with unspecified complications: Secondary | ICD-10-CM | POA: Diagnosis not present

## 2019-07-31 DIAGNOSIS — Z7982 Long term (current) use of aspirin: Secondary | ICD-10-CM | POA: Diagnosis not present

## 2019-07-31 DIAGNOSIS — N289 Disorder of kidney and ureter, unspecified: Secondary | ICD-10-CM | POA: Diagnosis not present

## 2019-07-31 DIAGNOSIS — I421 Obstructive hypertrophic cardiomyopathy: Secondary | ICD-10-CM | POA: Diagnosis present

## 2019-07-31 DIAGNOSIS — Z902 Acquired absence of lung [part of]: Secondary | ICD-10-CM | POA: Diagnosis not present

## 2019-07-31 DIAGNOSIS — I441 Atrioventricular block, second degree: Secondary | ICD-10-CM | POA: Diagnosis not present

## 2019-07-31 DIAGNOSIS — Z7952 Long term (current) use of systemic steroids: Secondary | ICD-10-CM | POA: Diagnosis not present

## 2019-07-31 DIAGNOSIS — I998 Other disorder of circulatory system: Secondary | ICD-10-CM | POA: Diagnosis not present

## 2019-07-31 DIAGNOSIS — R5082 Postprocedural fever: Secondary | ICD-10-CM | POA: Diagnosis not present

## 2019-07-31 DIAGNOSIS — E782 Mixed hyperlipidemia: Secondary | ICD-10-CM | POA: Diagnosis present

## 2019-07-31 DIAGNOSIS — Z89512 Acquired absence of left leg below knee: Secondary | ICD-10-CM | POA: Diagnosis not present

## 2019-07-31 DIAGNOSIS — E1351 Other specified diabetes mellitus with diabetic peripheral angiopathy without gangrene: Secondary | ICD-10-CM | POA: Diagnosis not present

## 2019-07-31 DIAGNOSIS — I70262 Atherosclerosis of native arteries of extremities with gangrene, left leg: Secondary | ICD-10-CM | POA: Diagnosis not present

## 2019-07-31 DIAGNOSIS — R9431 Abnormal electrocardiogram [ECG] [EKG]: Secondary | ICD-10-CM | POA: Diagnosis not present

## 2019-08-04 DIAGNOSIS — E1351 Other specified diabetes mellitus with diabetic peripheral angiopathy without gangrene: Secondary | ICD-10-CM | POA: Diagnosis not present

## 2019-08-04 DIAGNOSIS — F4321 Adjustment disorder with depressed mood: Secondary | ICD-10-CM | POA: Diagnosis present

## 2019-08-04 DIAGNOSIS — E1165 Type 2 diabetes mellitus with hyperglycemia: Secondary | ICD-10-CM | POA: Diagnosis not present

## 2019-08-04 DIAGNOSIS — E782 Mixed hyperlipidemia: Secondary | ICD-10-CM | POA: Diagnosis present

## 2019-08-04 DIAGNOSIS — D649 Anemia, unspecified: Secondary | ICD-10-CM | POA: Diagnosis present

## 2019-08-04 DIAGNOSIS — R5383 Other fatigue: Secondary | ICD-10-CM | POA: Diagnosis present

## 2019-08-04 DIAGNOSIS — Z8619 Personal history of other infectious and parasitic diseases: Secondary | ICD-10-CM | POA: Diagnosis not present

## 2019-08-04 DIAGNOSIS — I441 Atrioventricular block, second degree: Secondary | ICD-10-CM | POA: Diagnosis present

## 2019-08-04 DIAGNOSIS — Z743 Need for continuous supervision: Secondary | ICD-10-CM | POA: Diagnosis not present

## 2019-08-04 DIAGNOSIS — I422 Other hypertrophic cardiomyopathy: Secondary | ICD-10-CM | POA: Diagnosis present

## 2019-08-04 DIAGNOSIS — M79605 Pain in left leg: Secondary | ICD-10-CM | POA: Diagnosis not present

## 2019-08-04 DIAGNOSIS — M79622 Pain in left upper arm: Secondary | ICD-10-CM | POA: Diagnosis present

## 2019-08-04 DIAGNOSIS — G8918 Other acute postprocedural pain: Secondary | ICD-10-CM | POA: Diagnosis not present

## 2019-08-04 DIAGNOSIS — Z20822 Contact with and (suspected) exposure to covid-19: Secondary | ICD-10-CM | POA: Diagnosis present

## 2019-08-04 DIAGNOSIS — R531 Weakness: Secondary | ICD-10-CM | POA: Diagnosis present

## 2019-08-04 DIAGNOSIS — I998 Other disorder of circulatory system: Secondary | ICD-10-CM | POA: Diagnosis not present

## 2019-08-04 DIAGNOSIS — Z940 Kidney transplant status: Secondary | ICD-10-CM | POA: Diagnosis not present

## 2019-08-04 DIAGNOSIS — E118 Type 2 diabetes mellitus with unspecified complications: Secondary | ICD-10-CM | POA: Diagnosis not present

## 2019-08-04 DIAGNOSIS — Z89512 Acquired absence of left leg below knee: Secondary | ICD-10-CM | POA: Diagnosis not present

## 2019-08-04 DIAGNOSIS — R0789 Other chest pain: Secondary | ICD-10-CM | POA: Diagnosis present

## 2019-08-04 DIAGNOSIS — Z9981 Dependence on supplemental oxygen: Secondary | ICD-10-CM | POA: Diagnosis not present

## 2019-08-04 DIAGNOSIS — G546 Phantom limb syndrome with pain: Secondary | ICD-10-CM | POA: Diagnosis present

## 2019-08-04 DIAGNOSIS — E785 Hyperlipidemia, unspecified: Secondary | ICD-10-CM | POA: Diagnosis present

## 2019-08-04 DIAGNOSIS — Z902 Acquired absence of lung [part of]: Secondary | ICD-10-CM | POA: Diagnosis not present

## 2019-08-04 DIAGNOSIS — E1151 Type 2 diabetes mellitus with diabetic peripheral angiopathy without gangrene: Secondary | ICD-10-CM | POA: Diagnosis present

## 2019-08-04 DIAGNOSIS — B460 Pulmonary mucormycosis: Secondary | ICD-10-CM | POA: Diagnosis present

## 2019-08-04 DIAGNOSIS — Z4781 Encounter for orthopedic aftercare following surgical amputation: Secondary | ICD-10-CM | POA: Diagnosis not present

## 2019-08-04 DIAGNOSIS — Z79899 Other long term (current) drug therapy: Secondary | ICD-10-CM | POA: Diagnosis not present

## 2019-08-04 DIAGNOSIS — I10 Essential (primary) hypertension: Secondary | ICD-10-CM | POA: Diagnosis present

## 2019-08-04 MED ORDER — ACETAMINOPHEN 325 MG PO TABS
650.00 | ORAL_TABLET | ORAL | Status: DC
Start: ? — End: 2019-08-04

## 2019-08-04 MED ORDER — INSULIN LISPRO 100 UNIT/ML ~~LOC~~ SOLN
8.00 | SUBCUTANEOUS | Status: DC
Start: 2019-08-05 — End: 2019-08-04

## 2019-08-04 MED ORDER — GENERIC EXTERNAL MEDICATION
Status: DC
Start: ? — End: 2019-08-04

## 2019-08-04 MED ORDER — ENOXAPARIN SODIUM 40 MG/0.4ML ~~LOC~~ SOLN
40.00 | SUBCUTANEOUS | Status: DC
Start: 2019-08-04 — End: 2019-08-04

## 2019-08-04 MED ORDER — INSULIN LISPRO 100 UNIT/ML ~~LOC~~ SOLN
0.00 | SUBCUTANEOUS | Status: DC
Start: 2019-08-04 — End: 2019-08-04

## 2019-08-04 MED ORDER — FOLIC ACID 1 MG PO TABS
1.00 | ORAL_TABLET | ORAL | Status: DC
Start: 2019-08-05 — End: 2019-08-04

## 2019-08-04 MED ORDER — ONDANSETRON HCL 4 MG/2ML IJ SOLN
4.00 | INTRAMUSCULAR | Status: DC
Start: ? — End: 2019-08-04

## 2019-08-04 MED ORDER — SERTRALINE HCL 50 MG PO TABS
50.00 | ORAL_TABLET | ORAL | Status: DC
Start: 2019-08-05 — End: 2019-08-04

## 2019-08-04 MED ORDER — PREDNISONE 5 MG PO TABS
5.00 | ORAL_TABLET | ORAL | Status: DC
Start: 2019-08-05 — End: 2019-08-04

## 2019-08-04 MED ORDER — VALGANCICLOVIR HCL 450 MG PO TABS
450.00 | ORAL_TABLET | ORAL | Status: DC
Start: 2019-08-04 — End: 2019-08-04

## 2019-08-04 MED ORDER — SENNOSIDES-DOCUSATE SODIUM 8.6-50 MG PO TABS
2.00 | ORAL_TABLET | ORAL | Status: DC
Start: 2019-08-04 — End: 2019-08-04

## 2019-08-04 MED ORDER — INSULIN GLARGINE 100 UNIT/ML ~~LOC~~ SOLN
14.00 | SUBCUTANEOUS | Status: DC
Start: ? — End: 2019-08-04

## 2019-08-04 MED ORDER — ATORVASTATIN CALCIUM 40 MG PO TABS
40.00 | ORAL_TABLET | ORAL | Status: DC
Start: 2019-08-05 — End: 2019-08-04

## 2019-08-04 MED ORDER — GABAPENTIN 300 MG PO CAPS
300.00 | ORAL_CAPSULE | ORAL | Status: DC
Start: 2019-08-04 — End: 2019-08-04

## 2019-08-04 MED ORDER — DEXTROSE 50 % IV SOLN
12.50 | INTRAVENOUS | Status: DC
Start: ? — End: 2019-08-04

## 2019-08-04 MED ORDER — TORSEMIDE 10 MG PO TABS
10.00 | ORAL_TABLET | ORAL | Status: DC
Start: 2019-08-05 — End: 2019-08-04

## 2019-08-04 MED ORDER — ASPIRIN 81 MG PO TBEC
81.00 | DELAYED_RELEASE_TABLET | ORAL | Status: DC
Start: 2019-08-05 — End: 2019-08-04

## 2019-08-04 MED ORDER — GLUCAGON (RDNA) 1 MG IJ KIT
1.00 | PACK | INTRAMUSCULAR | Status: DC
Start: ? — End: 2019-08-04

## 2019-08-04 MED ORDER — OXYCODONE HCL 5 MG PO TABS
5.00 | ORAL_TABLET | ORAL | Status: DC
Start: ? — End: 2019-08-04

## 2019-08-04 MED ORDER — INSULIN LISPRO 100 UNIT/ML ~~LOC~~ SOLN
8.00 | SUBCUTANEOUS | Status: DC
Start: 2019-08-04 — End: 2019-08-04

## 2019-08-04 MED ORDER — FAMOTIDINE 20 MG PO TABS
20.00 | ORAL_TABLET | ORAL | Status: DC
Start: 2019-08-04 — End: 2019-08-04

## 2019-08-04 MED ORDER — MYCOPHENOLATE MOFETIL 250 MG PO CAPS
500.00 | ORAL_CAPSULE | ORAL | Status: DC
Start: 2019-08-04 — End: 2019-08-04

## 2019-08-04 MED ORDER — TACROLIMUS ER 4 MG PO TB24
4.00 | ORAL_TABLET | ORAL | Status: DC
Start: 2019-08-05 — End: 2019-08-04

## 2019-08-11 MED ORDER — INSULIN LISPRO 100 UNIT/ML ~~LOC~~ SOLN
8.00 | SUBCUTANEOUS | Status: DC
Start: 2019-08-12 — End: 2019-08-11

## 2019-08-11 MED ORDER — FOLIC ACID 1 MG PO TABS
1.00 | ORAL_TABLET | ORAL | Status: DC
Start: 2019-08-12 — End: 2019-08-11

## 2019-08-11 MED ORDER — ENOXAPARIN SODIUM 40 MG/0.4ML ~~LOC~~ SOLN
40.00 | SUBCUTANEOUS | Status: DC
Start: 2019-08-11 — End: 2019-08-11

## 2019-08-11 MED ORDER — GABAPENTIN 300 MG PO CAPS
300.00 | ORAL_CAPSULE | ORAL | Status: DC
Start: 2019-08-11 — End: 2019-08-11

## 2019-08-11 MED ORDER — FAMOTIDINE 20 MG PO TABS
20.00 | ORAL_TABLET | ORAL | Status: DC
Start: 2019-08-11 — End: 2019-08-11

## 2019-08-11 MED ORDER — VALGANCICLOVIR HCL 450 MG PO TABS
450.00 | ORAL_TABLET | ORAL | Status: DC
Start: 2019-08-11 — End: 2019-08-11

## 2019-08-11 MED ORDER — ATORVASTATIN CALCIUM 40 MG PO TABS
40.00 | ORAL_TABLET | ORAL | Status: DC
Start: 2019-08-12 — End: 2019-08-11

## 2019-08-11 MED ORDER — SERTRALINE HCL 50 MG PO TABS
50.00 | ORAL_TABLET | ORAL | Status: DC
Start: 2019-08-12 — End: 2019-08-11

## 2019-08-11 MED ORDER — ONDANSETRON HCL 4 MG/2ML IJ SOLN
4.00 | INTRAMUSCULAR | Status: DC
Start: ? — End: 2019-08-11

## 2019-08-11 MED ORDER — OXYCODONE HCL 5 MG PO TABS
5.00 | ORAL_TABLET | ORAL | Status: DC
Start: ? — End: 2019-08-11

## 2019-08-11 MED ORDER — ASPIRIN 81 MG PO TBEC
81.00 | DELAYED_RELEASE_TABLET | ORAL | Status: DC
Start: 2019-08-12 — End: 2019-08-11

## 2019-08-11 MED ORDER — POLYETHYLENE GLYCOL 3350 17 GM/SCOOP PO POWD
17.00 | ORAL | Status: DC
Start: 2019-08-11 — End: 2019-08-11

## 2019-08-11 MED ORDER — ACETAMINOPHEN 325 MG PO TABS
650.00 | ORAL_TABLET | ORAL | Status: DC
Start: 2019-08-11 — End: 2019-08-11

## 2019-08-11 MED ORDER — PREDNISONE 5 MG PO TABS
5.00 | ORAL_TABLET | ORAL | Status: DC
Start: 2019-08-12 — End: 2019-08-11

## 2019-08-11 MED ORDER — SENNOSIDES-DOCUSATE SODIUM 8.6-50 MG PO TABS
2.00 | ORAL_TABLET | ORAL | Status: DC
Start: 2019-08-11 — End: 2019-08-11

## 2019-08-11 MED ORDER — INSULIN LISPRO 100 UNIT/ML ~~LOC~~ SOLN
8.00 | SUBCUTANEOUS | Status: DC
Start: 2019-08-11 — End: 2019-08-11

## 2019-08-11 MED ORDER — MYCOPHENOLATE MOFETIL 250 MG PO CAPS
500.00 | ORAL_CAPSULE | ORAL | Status: DC
Start: 2019-08-11 — End: 2019-08-11

## 2019-08-11 MED ORDER — TORSEMIDE 20 MG PO TABS
10.00 | ORAL_TABLET | ORAL | Status: DC
Start: 2019-08-13 — End: 2019-08-11

## 2019-08-11 MED ORDER — GENERIC EXTERNAL MEDICATION
Status: DC
Start: ? — End: 2019-08-11

## 2019-08-11 MED ORDER — INSULIN LISPRO 100 UNIT/ML ~~LOC~~ SOLN
0.00 | SUBCUTANEOUS | Status: DC
Start: 2019-08-11 — End: 2019-08-11

## 2019-08-11 MED ORDER — INSULIN GLARGINE 100 UNIT/ML ~~LOC~~ SOLN
14.00 | SUBCUTANEOUS | Status: DC
Start: 2019-08-11 — End: 2019-08-11

## 2019-08-14 DIAGNOSIS — Z89512 Acquired absence of left leg below knee: Secondary | ICD-10-CM | POA: Diagnosis not present

## 2019-08-14 DIAGNOSIS — L03116 Cellulitis of left lower limb: Secondary | ICD-10-CM | POA: Diagnosis not present

## 2019-08-17 DIAGNOSIS — Z940 Kidney transplant status: Secondary | ICD-10-CM | POA: Diagnosis not present

## 2019-08-18 DIAGNOSIS — E1122 Type 2 diabetes mellitus with diabetic chronic kidney disease: Secondary | ICD-10-CM | POA: Diagnosis not present

## 2019-08-18 DIAGNOSIS — E1165 Type 2 diabetes mellitus with hyperglycemia: Secondary | ICD-10-CM | POA: Diagnosis not present

## 2019-08-18 DIAGNOSIS — I1310 Hypertensive heart and chronic kidney disease without heart failure, with stage 1 through stage 4 chronic kidney disease, or unspecified chronic kidney disease: Secondary | ICD-10-CM | POA: Diagnosis not present

## 2019-08-18 DIAGNOSIS — I422 Other hypertrophic cardiomyopathy: Secondary | ICD-10-CM | POA: Diagnosis not present

## 2019-08-18 DIAGNOSIS — Z940 Kidney transplant status: Secondary | ICD-10-CM | POA: Diagnosis not present

## 2019-08-18 DIAGNOSIS — N184 Chronic kidney disease, stage 4 (severe): Secondary | ICD-10-CM | POA: Diagnosis not present

## 2019-08-18 DIAGNOSIS — Z89512 Acquired absence of left leg below knee: Secondary | ICD-10-CM | POA: Diagnosis not present

## 2019-08-18 DIAGNOSIS — Z4781 Encounter for orthopedic aftercare following surgical amputation: Secondary | ICD-10-CM | POA: Diagnosis not present

## 2019-08-18 DIAGNOSIS — E1151 Type 2 diabetes mellitus with diabetic peripheral angiopathy without gangrene: Secondary | ICD-10-CM | POA: Diagnosis not present

## 2019-08-18 DIAGNOSIS — Z4801 Encounter for change or removal of surgical wound dressing: Secondary | ICD-10-CM | POA: Diagnosis not present

## 2019-08-22 DIAGNOSIS — Z4781 Encounter for orthopedic aftercare following surgical amputation: Secondary | ICD-10-CM | POA: Diagnosis not present

## 2019-08-22 DIAGNOSIS — E1151 Type 2 diabetes mellitus with diabetic peripheral angiopathy without gangrene: Secondary | ICD-10-CM | POA: Diagnosis not present

## 2019-08-22 DIAGNOSIS — I1310 Hypertensive heart and chronic kidney disease without heart failure, with stage 1 through stage 4 chronic kidney disease, or unspecified chronic kidney disease: Secondary | ICD-10-CM | POA: Diagnosis not present

## 2019-08-22 DIAGNOSIS — E1122 Type 2 diabetes mellitus with diabetic chronic kidney disease: Secondary | ICD-10-CM | POA: Diagnosis not present

## 2019-08-22 DIAGNOSIS — E1165 Type 2 diabetes mellitus with hyperglycemia: Secondary | ICD-10-CM | POA: Diagnosis not present

## 2019-08-22 DIAGNOSIS — Z4801 Encounter for change or removal of surgical wound dressing: Secondary | ICD-10-CM | POA: Diagnosis not present

## 2019-08-23 DIAGNOSIS — Z940 Kidney transplant status: Secondary | ICD-10-CM | POA: Diagnosis not present

## 2019-08-23 DIAGNOSIS — Z4822 Encounter for aftercare following kidney transplant: Secondary | ICD-10-CM | POA: Diagnosis not present

## 2019-08-23 DIAGNOSIS — B460 Pulmonary mucormycosis: Secondary | ICD-10-CM | POA: Diagnosis not present

## 2019-08-23 DIAGNOSIS — I313 Pericardial effusion (noninflammatory): Secondary | ICD-10-CM | POA: Diagnosis not present

## 2019-08-23 DIAGNOSIS — D849 Immunodeficiency, unspecified: Secondary | ICD-10-CM | POA: Diagnosis not present

## 2019-08-23 DIAGNOSIS — E119 Type 2 diabetes mellitus without complications: Secondary | ICD-10-CM | POA: Diagnosis not present

## 2019-08-23 DIAGNOSIS — B259 Cytomegaloviral disease, unspecified: Secondary | ICD-10-CM | POA: Diagnosis not present

## 2019-08-23 DIAGNOSIS — Z87440 Personal history of urinary (tract) infections: Secondary | ICD-10-CM | POA: Diagnosis not present

## 2019-08-23 DIAGNOSIS — D899 Disorder involving the immune mechanism, unspecified: Secondary | ICD-10-CM | POA: Diagnosis not present

## 2019-08-23 DIAGNOSIS — I10 Essential (primary) hypertension: Secondary | ICD-10-CM | POA: Diagnosis not present

## 2019-08-23 DIAGNOSIS — Z89512 Acquired absence of left leg below knee: Secondary | ICD-10-CM | POA: Diagnosis not present

## 2019-08-25 DIAGNOSIS — E1122 Type 2 diabetes mellitus with diabetic chronic kidney disease: Secondary | ICD-10-CM | POA: Diagnosis not present

## 2019-08-25 DIAGNOSIS — E1165 Type 2 diabetes mellitus with hyperglycemia: Secondary | ICD-10-CM | POA: Diagnosis not present

## 2019-08-25 DIAGNOSIS — Z4801 Encounter for change or removal of surgical wound dressing: Secondary | ICD-10-CM | POA: Diagnosis not present

## 2019-08-25 DIAGNOSIS — I1310 Hypertensive heart and chronic kidney disease without heart failure, with stage 1 through stage 4 chronic kidney disease, or unspecified chronic kidney disease: Secondary | ICD-10-CM | POA: Diagnosis not present

## 2019-08-25 DIAGNOSIS — Z4781 Encounter for orthopedic aftercare following surgical amputation: Secondary | ICD-10-CM | POA: Diagnosis not present

## 2019-08-25 DIAGNOSIS — E1151 Type 2 diabetes mellitus with diabetic peripheral angiopathy without gangrene: Secondary | ICD-10-CM | POA: Diagnosis not present

## 2019-08-28 DIAGNOSIS — Z4801 Encounter for change or removal of surgical wound dressing: Secondary | ICD-10-CM | POA: Diagnosis not present

## 2019-08-28 DIAGNOSIS — E1151 Type 2 diabetes mellitus with diabetic peripheral angiopathy without gangrene: Secondary | ICD-10-CM | POA: Diagnosis not present

## 2019-08-28 DIAGNOSIS — I1310 Hypertensive heart and chronic kidney disease without heart failure, with stage 1 through stage 4 chronic kidney disease, or unspecified chronic kidney disease: Secondary | ICD-10-CM | POA: Diagnosis not present

## 2019-08-28 DIAGNOSIS — E1122 Type 2 diabetes mellitus with diabetic chronic kidney disease: Secondary | ICD-10-CM | POA: Diagnosis not present

## 2019-08-28 DIAGNOSIS — Z4781 Encounter for orthopedic aftercare following surgical amputation: Secondary | ICD-10-CM | POA: Diagnosis not present

## 2019-08-28 DIAGNOSIS — E1165 Type 2 diabetes mellitus with hyperglycemia: Secondary | ICD-10-CM | POA: Diagnosis not present

## 2019-08-31 DIAGNOSIS — I1310 Hypertensive heart and chronic kidney disease without heart failure, with stage 1 through stage 4 chronic kidney disease, or unspecified chronic kidney disease: Secondary | ICD-10-CM | POA: Diagnosis not present

## 2019-08-31 DIAGNOSIS — E1165 Type 2 diabetes mellitus with hyperglycemia: Secondary | ICD-10-CM | POA: Diagnosis not present

## 2019-08-31 DIAGNOSIS — E1122 Type 2 diabetes mellitus with diabetic chronic kidney disease: Secondary | ICD-10-CM | POA: Diagnosis not present

## 2019-08-31 DIAGNOSIS — Z4781 Encounter for orthopedic aftercare following surgical amputation: Secondary | ICD-10-CM | POA: Diagnosis not present

## 2019-08-31 DIAGNOSIS — Z4801 Encounter for change or removal of surgical wound dressing: Secondary | ICD-10-CM | POA: Diagnosis not present

## 2019-08-31 DIAGNOSIS — E1151 Type 2 diabetes mellitus with diabetic peripheral angiopathy without gangrene: Secondary | ICD-10-CM | POA: Diagnosis not present

## 2019-09-01 DIAGNOSIS — E1122 Type 2 diabetes mellitus with diabetic chronic kidney disease: Secondary | ICD-10-CM | POA: Diagnosis not present

## 2019-09-01 DIAGNOSIS — I1310 Hypertensive heart and chronic kidney disease without heart failure, with stage 1 through stage 4 chronic kidney disease, or unspecified chronic kidney disease: Secondary | ICD-10-CM | POA: Diagnosis not present

## 2019-09-01 DIAGNOSIS — Z4781 Encounter for orthopedic aftercare following surgical amputation: Secondary | ICD-10-CM | POA: Diagnosis not present

## 2019-09-01 DIAGNOSIS — E1151 Type 2 diabetes mellitus with diabetic peripheral angiopathy without gangrene: Secondary | ICD-10-CM | POA: Diagnosis not present

## 2019-09-01 DIAGNOSIS — Z4801 Encounter for change or removal of surgical wound dressing: Secondary | ICD-10-CM | POA: Diagnosis not present

## 2019-09-01 DIAGNOSIS — E1165 Type 2 diabetes mellitus with hyperglycemia: Secondary | ICD-10-CM | POA: Diagnosis not present

## 2019-09-06 DIAGNOSIS — Z4781 Encounter for orthopedic aftercare following surgical amputation: Secondary | ICD-10-CM | POA: Diagnosis not present

## 2019-09-06 DIAGNOSIS — Z89512 Acquired absence of left leg below knee: Secondary | ICD-10-CM | POA: Diagnosis not present

## 2019-09-06 DIAGNOSIS — Z940 Kidney transplant status: Secondary | ICD-10-CM | POA: Diagnosis not present

## 2019-09-06 DIAGNOSIS — D849 Immunodeficiency, unspecified: Secondary | ICD-10-CM | POA: Diagnosis not present

## 2019-09-08 DIAGNOSIS — E1151 Type 2 diabetes mellitus with diabetic peripheral angiopathy without gangrene: Secondary | ICD-10-CM | POA: Diagnosis not present

## 2019-09-08 DIAGNOSIS — Z4781 Encounter for orthopedic aftercare following surgical amputation: Secondary | ICD-10-CM | POA: Diagnosis not present

## 2019-09-08 DIAGNOSIS — I1310 Hypertensive heart and chronic kidney disease without heart failure, with stage 1 through stage 4 chronic kidney disease, or unspecified chronic kidney disease: Secondary | ICD-10-CM | POA: Diagnosis not present

## 2019-09-08 DIAGNOSIS — Z4801 Encounter for change or removal of surgical wound dressing: Secondary | ICD-10-CM | POA: Diagnosis not present

## 2019-09-08 DIAGNOSIS — E1165 Type 2 diabetes mellitus with hyperglycemia: Secondary | ICD-10-CM | POA: Diagnosis not present

## 2019-09-08 DIAGNOSIS — E1122 Type 2 diabetes mellitus with diabetic chronic kidney disease: Secondary | ICD-10-CM | POA: Diagnosis not present

## 2019-09-12 DIAGNOSIS — Z4781 Encounter for orthopedic aftercare following surgical amputation: Secondary | ICD-10-CM | POA: Diagnosis not present

## 2019-09-12 DIAGNOSIS — E1122 Type 2 diabetes mellitus with diabetic chronic kidney disease: Secondary | ICD-10-CM | POA: Diagnosis not present

## 2019-09-12 DIAGNOSIS — E1151 Type 2 diabetes mellitus with diabetic peripheral angiopathy without gangrene: Secondary | ICD-10-CM | POA: Diagnosis not present

## 2019-09-12 DIAGNOSIS — Z4801 Encounter for change or removal of surgical wound dressing: Secondary | ICD-10-CM | POA: Diagnosis not present

## 2019-09-12 DIAGNOSIS — E1165 Type 2 diabetes mellitus with hyperglycemia: Secondary | ICD-10-CM | POA: Diagnosis not present

## 2019-09-12 DIAGNOSIS — I1310 Hypertensive heart and chronic kidney disease without heart failure, with stage 1 through stage 4 chronic kidney disease, or unspecified chronic kidney disease: Secondary | ICD-10-CM | POA: Diagnosis not present

## 2019-09-13 DIAGNOSIS — E1165 Type 2 diabetes mellitus with hyperglycemia: Secondary | ICD-10-CM | POA: Diagnosis not present

## 2019-09-13 DIAGNOSIS — I1310 Hypertensive heart and chronic kidney disease without heart failure, with stage 1 through stage 4 chronic kidney disease, or unspecified chronic kidney disease: Secondary | ICD-10-CM | POA: Diagnosis not present

## 2019-09-13 DIAGNOSIS — Z4781 Encounter for orthopedic aftercare following surgical amputation: Secondary | ICD-10-CM | POA: Diagnosis not present

## 2019-09-13 DIAGNOSIS — E1122 Type 2 diabetes mellitus with diabetic chronic kidney disease: Secondary | ICD-10-CM | POA: Diagnosis not present

## 2019-09-13 DIAGNOSIS — Z4801 Encounter for change or removal of surgical wound dressing: Secondary | ICD-10-CM | POA: Diagnosis not present

## 2019-09-13 DIAGNOSIS — E1151 Type 2 diabetes mellitus with diabetic peripheral angiopathy without gangrene: Secondary | ICD-10-CM | POA: Diagnosis not present

## 2019-09-17 DIAGNOSIS — I1310 Hypertensive heart and chronic kidney disease without heart failure, with stage 1 through stage 4 chronic kidney disease, or unspecified chronic kidney disease: Secondary | ICD-10-CM | POA: Diagnosis not present

## 2019-09-17 DIAGNOSIS — E1165 Type 2 diabetes mellitus with hyperglycemia: Secondary | ICD-10-CM | POA: Diagnosis not present

## 2019-09-17 DIAGNOSIS — Z940 Kidney transplant status: Secondary | ICD-10-CM | POA: Diagnosis not present

## 2019-09-17 DIAGNOSIS — I422 Other hypertrophic cardiomyopathy: Secondary | ICD-10-CM | POA: Diagnosis not present

## 2019-09-17 DIAGNOSIS — N184 Chronic kidney disease, stage 4 (severe): Secondary | ICD-10-CM | POA: Diagnosis not present

## 2019-09-17 DIAGNOSIS — E1122 Type 2 diabetes mellitus with diabetic chronic kidney disease: Secondary | ICD-10-CM | POA: Diagnosis not present

## 2019-09-17 DIAGNOSIS — Z89512 Acquired absence of left leg below knee: Secondary | ICD-10-CM | POA: Diagnosis not present

## 2019-09-17 DIAGNOSIS — Z4781 Encounter for orthopedic aftercare following surgical amputation: Secondary | ICD-10-CM | POA: Diagnosis not present

## 2019-09-17 DIAGNOSIS — Z4801 Encounter for change or removal of surgical wound dressing: Secondary | ICD-10-CM | POA: Diagnosis not present

## 2019-09-17 DIAGNOSIS — E1151 Type 2 diabetes mellitus with diabetic peripheral angiopathy without gangrene: Secondary | ICD-10-CM | POA: Diagnosis not present

## 2019-09-19 DIAGNOSIS — E1165 Type 2 diabetes mellitus with hyperglycemia: Secondary | ICD-10-CM | POA: Diagnosis not present

## 2019-09-19 DIAGNOSIS — E1122 Type 2 diabetes mellitus with diabetic chronic kidney disease: Secondary | ICD-10-CM | POA: Diagnosis not present

## 2019-09-19 DIAGNOSIS — E1151 Type 2 diabetes mellitus with diabetic peripheral angiopathy without gangrene: Secondary | ICD-10-CM | POA: Diagnosis not present

## 2019-09-19 DIAGNOSIS — Z4801 Encounter for change or removal of surgical wound dressing: Secondary | ICD-10-CM | POA: Diagnosis not present

## 2019-09-19 DIAGNOSIS — I1310 Hypertensive heart and chronic kidney disease without heart failure, with stage 1 through stage 4 chronic kidney disease, or unspecified chronic kidney disease: Secondary | ICD-10-CM | POA: Diagnosis not present

## 2019-09-19 DIAGNOSIS — Z4781 Encounter for orthopedic aftercare following surgical amputation: Secondary | ICD-10-CM | POA: Diagnosis not present

## 2019-09-21 DIAGNOSIS — I1310 Hypertensive heart and chronic kidney disease without heart failure, with stage 1 through stage 4 chronic kidney disease, or unspecified chronic kidney disease: Secondary | ICD-10-CM | POA: Diagnosis not present

## 2019-09-21 DIAGNOSIS — Z4781 Encounter for orthopedic aftercare following surgical amputation: Secondary | ICD-10-CM | POA: Diagnosis not present

## 2019-09-21 DIAGNOSIS — E1151 Type 2 diabetes mellitus with diabetic peripheral angiopathy without gangrene: Secondary | ICD-10-CM | POA: Diagnosis not present

## 2019-09-21 DIAGNOSIS — Z4801 Encounter for change or removal of surgical wound dressing: Secondary | ICD-10-CM | POA: Diagnosis not present

## 2019-09-21 DIAGNOSIS — E1122 Type 2 diabetes mellitus with diabetic chronic kidney disease: Secondary | ICD-10-CM | POA: Diagnosis not present

## 2019-09-21 DIAGNOSIS — E1165 Type 2 diabetes mellitus with hyperglycemia: Secondary | ICD-10-CM | POA: Diagnosis not present

## 2019-09-28 DIAGNOSIS — Z4781 Encounter for orthopedic aftercare following surgical amputation: Secondary | ICD-10-CM | POA: Diagnosis not present

## 2019-09-28 DIAGNOSIS — I1310 Hypertensive heart and chronic kidney disease without heart failure, with stage 1 through stage 4 chronic kidney disease, or unspecified chronic kidney disease: Secondary | ICD-10-CM | POA: Diagnosis not present

## 2019-09-28 DIAGNOSIS — Z4801 Encounter for change or removal of surgical wound dressing: Secondary | ICD-10-CM | POA: Diagnosis not present

## 2019-09-28 DIAGNOSIS — E1165 Type 2 diabetes mellitus with hyperglycemia: Secondary | ICD-10-CM | POA: Diagnosis not present

## 2019-09-28 DIAGNOSIS — E1122 Type 2 diabetes mellitus with diabetic chronic kidney disease: Secondary | ICD-10-CM | POA: Diagnosis not present

## 2019-09-28 DIAGNOSIS — E1151 Type 2 diabetes mellitus with diabetic peripheral angiopathy without gangrene: Secondary | ICD-10-CM | POA: Diagnosis not present

## 2019-09-29 DIAGNOSIS — T8131XA Disruption of external operation (surgical) wound, not elsewhere classified, initial encounter: Secondary | ICD-10-CM | POA: Diagnosis not present

## 2019-09-29 DIAGNOSIS — Z89512 Acquired absence of left leg below knee: Secondary | ICD-10-CM | POA: Diagnosis not present

## 2019-09-29 DIAGNOSIS — E782 Mixed hyperlipidemia: Secondary | ICD-10-CM | POA: Diagnosis not present

## 2019-10-02 DIAGNOSIS — Z940 Kidney transplant status: Secondary | ICD-10-CM | POA: Diagnosis not present

## 2019-10-02 DIAGNOSIS — L97509 Non-pressure chronic ulcer of other part of unspecified foot with unspecified severity: Secondary | ICD-10-CM | POA: Diagnosis not present

## 2019-10-02 DIAGNOSIS — Z5181 Encounter for therapeutic drug level monitoring: Secondary | ICD-10-CM | POA: Diagnosis not present

## 2019-10-02 DIAGNOSIS — E1169 Type 2 diabetes mellitus with other specified complication: Secondary | ICD-10-CM | POA: Diagnosis not present

## 2019-10-02 DIAGNOSIS — Z794 Long term (current) use of insulin: Secondary | ICD-10-CM | POA: Diagnosis not present

## 2019-10-02 DIAGNOSIS — E13621 Other specified diabetes mellitus with foot ulcer: Secondary | ICD-10-CM | POA: Diagnosis not present

## 2019-10-04 DIAGNOSIS — E1122 Type 2 diabetes mellitus with diabetic chronic kidney disease: Secondary | ICD-10-CM | POA: Diagnosis not present

## 2019-10-04 DIAGNOSIS — I1310 Hypertensive heart and chronic kidney disease without heart failure, with stage 1 through stage 4 chronic kidney disease, or unspecified chronic kidney disease: Secondary | ICD-10-CM | POA: Diagnosis not present

## 2019-10-04 DIAGNOSIS — E1151 Type 2 diabetes mellitus with diabetic peripheral angiopathy without gangrene: Secondary | ICD-10-CM | POA: Diagnosis not present

## 2019-10-04 DIAGNOSIS — Z4801 Encounter for change or removal of surgical wound dressing: Secondary | ICD-10-CM | POA: Diagnosis not present

## 2019-10-04 DIAGNOSIS — E1165 Type 2 diabetes mellitus with hyperglycemia: Secondary | ICD-10-CM | POA: Diagnosis not present

## 2019-10-04 DIAGNOSIS — Z4781 Encounter for orthopedic aftercare following surgical amputation: Secondary | ICD-10-CM | POA: Diagnosis not present

## 2019-10-13 DIAGNOSIS — E1151 Type 2 diabetes mellitus with diabetic peripheral angiopathy without gangrene: Secondary | ICD-10-CM | POA: Diagnosis not present

## 2019-10-13 DIAGNOSIS — Z4801 Encounter for change or removal of surgical wound dressing: Secondary | ICD-10-CM | POA: Diagnosis not present

## 2019-10-13 DIAGNOSIS — I1310 Hypertensive heart and chronic kidney disease without heart failure, with stage 1 through stage 4 chronic kidney disease, or unspecified chronic kidney disease: Secondary | ICD-10-CM | POA: Diagnosis not present

## 2019-10-13 DIAGNOSIS — G8918 Other acute postprocedural pain: Secondary | ICD-10-CM | POA: Diagnosis not present

## 2019-10-13 DIAGNOSIS — E1165 Type 2 diabetes mellitus with hyperglycemia: Secondary | ICD-10-CM | POA: Diagnosis not present

## 2019-10-13 DIAGNOSIS — Z89512 Acquired absence of left leg below knee: Secondary | ICD-10-CM | POA: Diagnosis not present

## 2019-10-13 DIAGNOSIS — Z4781 Encounter for orthopedic aftercare following surgical amputation: Secondary | ICD-10-CM | POA: Diagnosis not present

## 2019-10-13 DIAGNOSIS — E1122 Type 2 diabetes mellitus with diabetic chronic kidney disease: Secondary | ICD-10-CM | POA: Diagnosis not present

## 2019-10-17 DIAGNOSIS — I422 Other hypertrophic cardiomyopathy: Secondary | ICD-10-CM | POA: Diagnosis not present

## 2019-10-17 DIAGNOSIS — Z4801 Encounter for change or removal of surgical wound dressing: Secondary | ICD-10-CM | POA: Diagnosis not present

## 2019-10-17 DIAGNOSIS — N184 Chronic kidney disease, stage 4 (severe): Secondary | ICD-10-CM | POA: Diagnosis not present

## 2019-10-17 DIAGNOSIS — Z940 Kidney transplant status: Secondary | ICD-10-CM | POA: Diagnosis not present

## 2019-10-17 DIAGNOSIS — E1122 Type 2 diabetes mellitus with diabetic chronic kidney disease: Secondary | ICD-10-CM | POA: Diagnosis not present

## 2019-10-17 DIAGNOSIS — E1165 Type 2 diabetes mellitus with hyperglycemia: Secondary | ICD-10-CM | POA: Diagnosis not present

## 2019-10-17 DIAGNOSIS — Z89512 Acquired absence of left leg below knee: Secondary | ICD-10-CM | POA: Diagnosis not present

## 2019-10-17 DIAGNOSIS — Z4781 Encounter for orthopedic aftercare following surgical amputation: Secondary | ICD-10-CM | POA: Diagnosis not present

## 2019-10-17 DIAGNOSIS — I1310 Hypertensive heart and chronic kidney disease without heart failure, with stage 1 through stage 4 chronic kidney disease, or unspecified chronic kidney disease: Secondary | ICD-10-CM | POA: Diagnosis not present

## 2019-10-17 DIAGNOSIS — D849 Immunodeficiency, unspecified: Secondary | ICD-10-CM | POA: Diagnosis not present

## 2019-10-17 DIAGNOSIS — E1151 Type 2 diabetes mellitus with diabetic peripheral angiopathy without gangrene: Secondary | ICD-10-CM | POA: Diagnosis not present

## 2019-10-18 DIAGNOSIS — E1165 Type 2 diabetes mellitus with hyperglycemia: Secondary | ICD-10-CM | POA: Diagnosis not present

## 2019-10-18 DIAGNOSIS — E1122 Type 2 diabetes mellitus with diabetic chronic kidney disease: Secondary | ICD-10-CM | POA: Diagnosis not present

## 2019-10-18 DIAGNOSIS — I1310 Hypertensive heart and chronic kidney disease without heart failure, with stage 1 through stage 4 chronic kidney disease, or unspecified chronic kidney disease: Secondary | ICD-10-CM | POA: Diagnosis not present

## 2019-10-18 DIAGNOSIS — Z4801 Encounter for change or removal of surgical wound dressing: Secondary | ICD-10-CM | POA: Diagnosis not present

## 2019-10-18 DIAGNOSIS — Z4781 Encounter for orthopedic aftercare following surgical amputation: Secondary | ICD-10-CM | POA: Diagnosis not present

## 2019-10-18 DIAGNOSIS — E1151 Type 2 diabetes mellitus with diabetic peripheral angiopathy without gangrene: Secondary | ICD-10-CM | POA: Diagnosis not present

## 2019-10-23 DIAGNOSIS — Z4781 Encounter for orthopedic aftercare following surgical amputation: Secondary | ICD-10-CM | POA: Diagnosis not present

## 2019-10-23 DIAGNOSIS — Z4801 Encounter for change or removal of surgical wound dressing: Secondary | ICD-10-CM | POA: Diagnosis not present

## 2019-10-26 DIAGNOSIS — Z4801 Encounter for change or removal of surgical wound dressing: Secondary | ICD-10-CM | POA: Diagnosis not present

## 2019-10-26 DIAGNOSIS — E1151 Type 2 diabetes mellitus with diabetic peripheral angiopathy without gangrene: Secondary | ICD-10-CM | POA: Diagnosis not present

## 2019-10-26 DIAGNOSIS — E1122 Type 2 diabetes mellitus with diabetic chronic kidney disease: Secondary | ICD-10-CM | POA: Diagnosis not present

## 2019-10-26 DIAGNOSIS — I1310 Hypertensive heart and chronic kidney disease without heart failure, with stage 1 through stage 4 chronic kidney disease, or unspecified chronic kidney disease: Secondary | ICD-10-CM | POA: Diagnosis not present

## 2019-10-26 DIAGNOSIS — E1165 Type 2 diabetes mellitus with hyperglycemia: Secondary | ICD-10-CM | POA: Diagnosis not present

## 2019-10-26 DIAGNOSIS — Z4781 Encounter for orthopedic aftercare following surgical amputation: Secondary | ICD-10-CM | POA: Diagnosis not present

## 2019-10-30 DIAGNOSIS — N281 Cyst of kidney, acquired: Secondary | ICD-10-CM | POA: Diagnosis not present

## 2019-10-30 DIAGNOSIS — Z89512 Acquired absence of left leg below knee: Secondary | ICD-10-CM | POA: Diagnosis not present

## 2019-10-30 DIAGNOSIS — G8918 Other acute postprocedural pain: Secondary | ICD-10-CM | POA: Diagnosis not present

## 2019-10-30 DIAGNOSIS — T8781 Dehiscence of amputation stump: Secondary | ICD-10-CM | POA: Diagnosis not present

## 2019-10-30 DIAGNOSIS — Z940 Kidney transplant status: Secondary | ICD-10-CM | POA: Diagnosis not present

## 2019-11-02 DIAGNOSIS — Z4801 Encounter for change or removal of surgical wound dressing: Secondary | ICD-10-CM | POA: Diagnosis not present

## 2019-11-02 DIAGNOSIS — I1310 Hypertensive heart and chronic kidney disease without heart failure, with stage 1 through stage 4 chronic kidney disease, or unspecified chronic kidney disease: Secondary | ICD-10-CM | POA: Diagnosis not present

## 2019-11-02 DIAGNOSIS — E1165 Type 2 diabetes mellitus with hyperglycemia: Secondary | ICD-10-CM | POA: Diagnosis not present

## 2019-11-02 DIAGNOSIS — E1122 Type 2 diabetes mellitus with diabetic chronic kidney disease: Secondary | ICD-10-CM | POA: Diagnosis not present

## 2019-11-02 DIAGNOSIS — Z4781 Encounter for orthopedic aftercare following surgical amputation: Secondary | ICD-10-CM | POA: Diagnosis not present

## 2019-11-02 DIAGNOSIS — E1151 Type 2 diabetes mellitus with diabetic peripheral angiopathy without gangrene: Secondary | ICD-10-CM | POA: Diagnosis not present

## 2019-11-13 DIAGNOSIS — D849 Immunodeficiency, unspecified: Secondary | ICD-10-CM | POA: Diagnosis not present

## 2019-11-13 DIAGNOSIS — Z940 Kidney transplant status: Secondary | ICD-10-CM | POA: Diagnosis not present

## 2019-11-13 DIAGNOSIS — L03116 Cellulitis of left lower limb: Secondary | ICD-10-CM | POA: Diagnosis not present

## 2019-11-13 DIAGNOSIS — Z89512 Acquired absence of left leg below knee: Secondary | ICD-10-CM | POA: Diagnosis not present

## 2019-11-16 DIAGNOSIS — E1165 Type 2 diabetes mellitus with hyperglycemia: Secondary | ICD-10-CM | POA: Diagnosis not present

## 2019-11-16 DIAGNOSIS — N184 Chronic kidney disease, stage 4 (severe): Secondary | ICD-10-CM | POA: Diagnosis not present

## 2019-11-16 DIAGNOSIS — Z940 Kidney transplant status: Secondary | ICD-10-CM | POA: Diagnosis not present

## 2019-11-16 DIAGNOSIS — Z89512 Acquired absence of left leg below knee: Secondary | ICD-10-CM | POA: Diagnosis not present

## 2019-11-16 DIAGNOSIS — I1310 Hypertensive heart and chronic kidney disease without heart failure, with stage 1 through stage 4 chronic kidney disease, or unspecified chronic kidney disease: Secondary | ICD-10-CM | POA: Diagnosis not present

## 2019-11-16 DIAGNOSIS — E1122 Type 2 diabetes mellitus with diabetic chronic kidney disease: Secondary | ICD-10-CM | POA: Diagnosis not present

## 2019-11-16 DIAGNOSIS — Z4801 Encounter for change or removal of surgical wound dressing: Secondary | ICD-10-CM | POA: Diagnosis not present

## 2019-11-16 DIAGNOSIS — Z4781 Encounter for orthopedic aftercare following surgical amputation: Secondary | ICD-10-CM | POA: Diagnosis not present

## 2019-11-16 DIAGNOSIS — I422 Other hypertrophic cardiomyopathy: Secondary | ICD-10-CM | POA: Diagnosis not present

## 2019-11-16 DIAGNOSIS — E1151 Type 2 diabetes mellitus with diabetic peripheral angiopathy without gangrene: Secondary | ICD-10-CM | POA: Diagnosis not present

## 2019-11-29 DIAGNOSIS — E1351 Other specified diabetes mellitus with diabetic peripheral angiopathy without gangrene: Secondary | ICD-10-CM | POA: Diagnosis not present

## 2019-11-29 DIAGNOSIS — Z89512 Acquired absence of left leg below knee: Secondary | ICD-10-CM | POA: Diagnosis not present

## 2019-11-29 DIAGNOSIS — Z902 Acquired absence of lung [part of]: Secondary | ICD-10-CM | POA: Diagnosis not present

## 2019-11-29 DIAGNOSIS — E1151 Type 2 diabetes mellitus with diabetic peripheral angiopathy without gangrene: Secondary | ICD-10-CM | POA: Diagnosis not present

## 2019-11-29 DIAGNOSIS — D84821 Immunodeficiency due to drugs: Secondary | ICD-10-CM | POA: Diagnosis not present

## 2019-11-29 DIAGNOSIS — D849 Immunodeficiency, unspecified: Secondary | ICD-10-CM | POA: Diagnosis not present

## 2019-11-29 DIAGNOSIS — C649 Malignant neoplasm of unspecified kidney, except renal pelvis: Secondary | ICD-10-CM | POA: Diagnosis not present

## 2019-11-29 DIAGNOSIS — I10 Essential (primary) hypertension: Secondary | ICD-10-CM | POA: Diagnosis not present

## 2019-11-29 DIAGNOSIS — Z79899 Other long term (current) drug therapy: Secondary | ICD-10-CM | POA: Diagnosis not present

## 2019-11-29 DIAGNOSIS — Z4822 Encounter for aftercare following kidney transplant: Secondary | ICD-10-CM | POA: Diagnosis not present

## 2019-11-29 DIAGNOSIS — E119 Type 2 diabetes mellitus without complications: Secondary | ICD-10-CM | POA: Diagnosis not present

## 2019-11-29 DIAGNOSIS — N2889 Other specified disorders of kidney and ureter: Secondary | ICD-10-CM | POA: Diagnosis not present

## 2019-11-29 DIAGNOSIS — R8281 Pyuria: Secondary | ICD-10-CM | POA: Diagnosis not present

## 2019-11-29 DIAGNOSIS — Z7952 Long term (current) use of systemic steroids: Secondary | ICD-10-CM | POA: Diagnosis not present

## 2019-11-29 DIAGNOSIS — E782 Mixed hyperlipidemia: Secondary | ICD-10-CM | POA: Diagnosis not present

## 2019-11-29 DIAGNOSIS — Z940 Kidney transplant status: Secondary | ICD-10-CM | POA: Diagnosis not present

## 2019-11-29 DIAGNOSIS — Z8619 Personal history of other infectious and parasitic diseases: Secondary | ICD-10-CM | POA: Diagnosis not present

## 2019-11-29 DIAGNOSIS — Z23 Encounter for immunization: Secondary | ICD-10-CM | POA: Diagnosis not present

## 2019-11-30 DIAGNOSIS — E1165 Type 2 diabetes mellitus with hyperglycemia: Secondary | ICD-10-CM | POA: Diagnosis not present

## 2019-11-30 DIAGNOSIS — E1151 Type 2 diabetes mellitus with diabetic peripheral angiopathy without gangrene: Secondary | ICD-10-CM | POA: Diagnosis not present

## 2019-11-30 DIAGNOSIS — I1310 Hypertensive heart and chronic kidney disease without heart failure, with stage 1 through stage 4 chronic kidney disease, or unspecified chronic kidney disease: Secondary | ICD-10-CM | POA: Diagnosis not present

## 2019-11-30 DIAGNOSIS — E1122 Type 2 diabetes mellitus with diabetic chronic kidney disease: Secondary | ICD-10-CM | POA: Diagnosis not present

## 2019-11-30 DIAGNOSIS — Z4781 Encounter for orthopedic aftercare following surgical amputation: Secondary | ICD-10-CM | POA: Diagnosis not present

## 2019-11-30 DIAGNOSIS — Z4801 Encounter for change or removal of surgical wound dressing: Secondary | ICD-10-CM | POA: Diagnosis not present

## 2019-12-04 DIAGNOSIS — Z89512 Acquired absence of left leg below knee: Secondary | ICD-10-CM | POA: Diagnosis not present

## 2019-12-12 DIAGNOSIS — E1151 Type 2 diabetes mellitus with diabetic peripheral angiopathy without gangrene: Secondary | ICD-10-CM | POA: Diagnosis not present

## 2019-12-12 DIAGNOSIS — I1310 Hypertensive heart and chronic kidney disease without heart failure, with stage 1 through stage 4 chronic kidney disease, or unspecified chronic kidney disease: Secondary | ICD-10-CM | POA: Diagnosis not present

## 2019-12-12 DIAGNOSIS — Z4781 Encounter for orthopedic aftercare following surgical amputation: Secondary | ICD-10-CM | POA: Diagnosis not present

## 2019-12-12 DIAGNOSIS — E1165 Type 2 diabetes mellitus with hyperglycemia: Secondary | ICD-10-CM | POA: Diagnosis not present

## 2019-12-12 DIAGNOSIS — E1122 Type 2 diabetes mellitus with diabetic chronic kidney disease: Secondary | ICD-10-CM | POA: Diagnosis not present

## 2019-12-12 DIAGNOSIS — Z4801 Encounter for change or removal of surgical wound dressing: Secondary | ICD-10-CM | POA: Diagnosis not present

## 2019-12-16 DIAGNOSIS — I422 Other hypertrophic cardiomyopathy: Secondary | ICD-10-CM | POA: Diagnosis not present

## 2019-12-16 DIAGNOSIS — E1165 Type 2 diabetes mellitus with hyperglycemia: Secondary | ICD-10-CM | POA: Diagnosis not present

## 2019-12-16 DIAGNOSIS — Z4781 Encounter for orthopedic aftercare following surgical amputation: Secondary | ICD-10-CM | POA: Diagnosis not present

## 2019-12-16 DIAGNOSIS — E1151 Type 2 diabetes mellitus with diabetic peripheral angiopathy without gangrene: Secondary | ICD-10-CM | POA: Diagnosis not present

## 2019-12-16 DIAGNOSIS — Z794 Long term (current) use of insulin: Secondary | ICD-10-CM | POA: Diagnosis not present

## 2019-12-16 DIAGNOSIS — E1122 Type 2 diabetes mellitus with diabetic chronic kidney disease: Secondary | ICD-10-CM | POA: Diagnosis not present

## 2019-12-16 DIAGNOSIS — Z89512 Acquired absence of left leg below knee: Secondary | ICD-10-CM | POA: Diagnosis not present

## 2019-12-16 DIAGNOSIS — Z940 Kidney transplant status: Secondary | ICD-10-CM | POA: Diagnosis not present

## 2019-12-16 DIAGNOSIS — I1310 Hypertensive heart and chronic kidney disease without heart failure, with stage 1 through stage 4 chronic kidney disease, or unspecified chronic kidney disease: Secondary | ICD-10-CM | POA: Diagnosis not present

## 2019-12-16 DIAGNOSIS — N184 Chronic kidney disease, stage 4 (severe): Secondary | ICD-10-CM | POA: Diagnosis not present

## 2019-12-19 DIAGNOSIS — N184 Chronic kidney disease, stage 4 (severe): Secondary | ICD-10-CM | POA: Diagnosis not present

## 2019-12-19 DIAGNOSIS — E1122 Type 2 diabetes mellitus with diabetic chronic kidney disease: Secondary | ICD-10-CM | POA: Diagnosis not present

## 2019-12-19 DIAGNOSIS — Z4781 Encounter for orthopedic aftercare following surgical amputation: Secondary | ICD-10-CM | POA: Diagnosis not present

## 2019-12-19 DIAGNOSIS — I1310 Hypertensive heart and chronic kidney disease without heart failure, with stage 1 through stage 4 chronic kidney disease, or unspecified chronic kidney disease: Secondary | ICD-10-CM | POA: Diagnosis not present

## 2019-12-19 DIAGNOSIS — E1151 Type 2 diabetes mellitus with diabetic peripheral angiopathy without gangrene: Secondary | ICD-10-CM | POA: Diagnosis not present

## 2019-12-19 DIAGNOSIS — E1165 Type 2 diabetes mellitus with hyperglycemia: Secondary | ICD-10-CM | POA: Diagnosis not present

## 2019-12-26 DIAGNOSIS — Z4781 Encounter for orthopedic aftercare following surgical amputation: Secondary | ICD-10-CM | POA: Diagnosis not present

## 2019-12-26 DIAGNOSIS — I1310 Hypertensive heart and chronic kidney disease without heart failure, with stage 1 through stage 4 chronic kidney disease, or unspecified chronic kidney disease: Secondary | ICD-10-CM | POA: Diagnosis not present

## 2019-12-26 DIAGNOSIS — E1151 Type 2 diabetes mellitus with diabetic peripheral angiopathy without gangrene: Secondary | ICD-10-CM | POA: Diagnosis not present

## 2019-12-26 DIAGNOSIS — N184 Chronic kidney disease, stage 4 (severe): Secondary | ICD-10-CM | POA: Diagnosis not present

## 2019-12-26 DIAGNOSIS — E1165 Type 2 diabetes mellitus with hyperglycemia: Secondary | ICD-10-CM | POA: Diagnosis not present

## 2019-12-26 DIAGNOSIS — E1122 Type 2 diabetes mellitus with diabetic chronic kidney disease: Secondary | ICD-10-CM | POA: Diagnosis not present

## 2019-12-28 DIAGNOSIS — E1165 Type 2 diabetes mellitus with hyperglycemia: Secondary | ICD-10-CM | POA: Diagnosis not present

## 2019-12-28 DIAGNOSIS — E1122 Type 2 diabetes mellitus with diabetic chronic kidney disease: Secondary | ICD-10-CM | POA: Diagnosis not present

## 2019-12-28 DIAGNOSIS — N184 Chronic kidney disease, stage 4 (severe): Secondary | ICD-10-CM | POA: Diagnosis not present

## 2019-12-28 DIAGNOSIS — E1151 Type 2 diabetes mellitus with diabetic peripheral angiopathy without gangrene: Secondary | ICD-10-CM | POA: Diagnosis not present

## 2019-12-28 DIAGNOSIS — I1310 Hypertensive heart and chronic kidney disease without heart failure, with stage 1 through stage 4 chronic kidney disease, or unspecified chronic kidney disease: Secondary | ICD-10-CM | POA: Diagnosis not present

## 2019-12-28 DIAGNOSIS — Z4781 Encounter for orthopedic aftercare following surgical amputation: Secondary | ICD-10-CM | POA: Diagnosis not present

## 2020-01-03 DIAGNOSIS — Z89512 Acquired absence of left leg below knee: Secondary | ICD-10-CM | POA: Diagnosis not present

## 2020-01-03 DIAGNOSIS — Z4781 Encounter for orthopedic aftercare following surgical amputation: Secondary | ICD-10-CM | POA: Diagnosis not present

## 2020-01-04 DIAGNOSIS — Z940 Kidney transplant status: Secondary | ICD-10-CM | POA: Diagnosis not present

## 2020-01-05 DIAGNOSIS — Z4781 Encounter for orthopedic aftercare following surgical amputation: Secondary | ICD-10-CM | POA: Diagnosis not present

## 2020-01-05 DIAGNOSIS — E1122 Type 2 diabetes mellitus with diabetic chronic kidney disease: Secondary | ICD-10-CM | POA: Diagnosis not present

## 2020-01-05 DIAGNOSIS — I1310 Hypertensive heart and chronic kidney disease without heart failure, with stage 1 through stage 4 chronic kidney disease, or unspecified chronic kidney disease: Secondary | ICD-10-CM | POA: Diagnosis not present

## 2020-01-05 DIAGNOSIS — E1151 Type 2 diabetes mellitus with diabetic peripheral angiopathy without gangrene: Secondary | ICD-10-CM | POA: Diagnosis not present

## 2020-01-05 DIAGNOSIS — E1165 Type 2 diabetes mellitus with hyperglycemia: Secondary | ICD-10-CM | POA: Diagnosis not present

## 2020-01-05 DIAGNOSIS — N184 Chronic kidney disease, stage 4 (severe): Secondary | ICD-10-CM | POA: Diagnosis not present

## 2020-01-09 DIAGNOSIS — E1165 Type 2 diabetes mellitus with hyperglycemia: Secondary | ICD-10-CM | POA: Diagnosis not present

## 2020-01-09 DIAGNOSIS — E1122 Type 2 diabetes mellitus with diabetic chronic kidney disease: Secondary | ICD-10-CM | POA: Diagnosis not present

## 2020-01-09 DIAGNOSIS — Z4781 Encounter for orthopedic aftercare following surgical amputation: Secondary | ICD-10-CM | POA: Diagnosis not present

## 2020-01-09 DIAGNOSIS — I1310 Hypertensive heart and chronic kidney disease without heart failure, with stage 1 through stage 4 chronic kidney disease, or unspecified chronic kidney disease: Secondary | ICD-10-CM | POA: Diagnosis not present

## 2020-01-09 DIAGNOSIS — N184 Chronic kidney disease, stage 4 (severe): Secondary | ICD-10-CM | POA: Diagnosis not present

## 2020-01-09 DIAGNOSIS — E1151 Type 2 diabetes mellitus with diabetic peripheral angiopathy without gangrene: Secondary | ICD-10-CM | POA: Diagnosis not present

## 2020-01-10 ENCOUNTER — Telehealth: Payer: Self-pay | Admitting: Family Medicine

## 2020-01-10 DIAGNOSIS — Z23 Encounter for immunization: Secondary | ICD-10-CM | POA: Diagnosis not present

## 2020-01-10 NOTE — Telephone Encounter (Signed)
Please go ahead with verbal order thank you

## 2020-01-10 NOTE — Telephone Encounter (Signed)
Alison Murray contacted and gave verbal orders for PT.

## 2020-01-10 NOTE — Telephone Encounter (Signed)
Shelley [name] calling from [facility]. Pt has been released by ortho that did her surgery. Pt has since began to go back to [facility] for prosthesis. Pt gets new prosthesis on Tuesday. [facility] is needing verbal order for PT to help pt learn to walk with new prosthesis. Please advise. Thank you

## 2020-01-15 DIAGNOSIS — N184 Chronic kidney disease, stage 4 (severe): Secondary | ICD-10-CM | POA: Diagnosis not present

## 2020-01-15 DIAGNOSIS — E1122 Type 2 diabetes mellitus with diabetic chronic kidney disease: Secondary | ICD-10-CM | POA: Diagnosis not present

## 2020-01-15 DIAGNOSIS — E1151 Type 2 diabetes mellitus with diabetic peripheral angiopathy without gangrene: Secondary | ICD-10-CM | POA: Diagnosis not present

## 2020-01-15 DIAGNOSIS — I1310 Hypertensive heart and chronic kidney disease without heart failure, with stage 1 through stage 4 chronic kidney disease, or unspecified chronic kidney disease: Secondary | ICD-10-CM | POA: Diagnosis not present

## 2020-01-15 DIAGNOSIS — Z4781 Encounter for orthopedic aftercare following surgical amputation: Secondary | ICD-10-CM | POA: Diagnosis not present

## 2020-01-15 DIAGNOSIS — Z940 Kidney transplant status: Secondary | ICD-10-CM | POA: Diagnosis not present

## 2020-01-15 DIAGNOSIS — Z89512 Acquired absence of left leg below knee: Secondary | ICD-10-CM | POA: Diagnosis not present

## 2020-01-15 DIAGNOSIS — Z794 Long term (current) use of insulin: Secondary | ICD-10-CM | POA: Diagnosis not present

## 2020-01-15 DIAGNOSIS — I422 Other hypertrophic cardiomyopathy: Secondary | ICD-10-CM | POA: Diagnosis not present

## 2020-01-15 DIAGNOSIS — E1165 Type 2 diabetes mellitus with hyperglycemia: Secondary | ICD-10-CM | POA: Diagnosis not present

## 2020-01-18 ENCOUNTER — Telehealth: Payer: Self-pay | Admitting: Family Medicine

## 2020-01-18 DIAGNOSIS — I1310 Hypertensive heart and chronic kidney disease without heart failure, with stage 1 through stage 4 chronic kidney disease, or unspecified chronic kidney disease: Secondary | ICD-10-CM | POA: Diagnosis not present

## 2020-01-18 DIAGNOSIS — E1122 Type 2 diabetes mellitus with diabetic chronic kidney disease: Secondary | ICD-10-CM | POA: Diagnosis not present

## 2020-01-18 DIAGNOSIS — E1165 Type 2 diabetes mellitus with hyperglycemia: Secondary | ICD-10-CM | POA: Diagnosis not present

## 2020-01-18 DIAGNOSIS — N184 Chronic kidney disease, stage 4 (severe): Secondary | ICD-10-CM | POA: Diagnosis not present

## 2020-01-18 DIAGNOSIS — E1151 Type 2 diabetes mellitus with diabetic peripheral angiopathy without gangrene: Secondary | ICD-10-CM | POA: Diagnosis not present

## 2020-01-18 DIAGNOSIS — Z4781 Encounter for orthopedic aftercare following surgical amputation: Secondary | ICD-10-CM | POA: Diagnosis not present

## 2020-01-18 NOTE — Telephone Encounter (Signed)
Left detailed message on secured voice mail.  

## 2020-01-18 NOTE — Telephone Encounter (Signed)
Please give verbal thank you

## 2020-01-18 NOTE — Telephone Encounter (Signed)
Rose Fillers PT from [facility] requesting verbal orders for therapy-gait training. Pt has new prosthesis and is needing to learn to walk with it. Please advise. Thank you  [phone] Voicemail is secure.

## 2020-01-19 DIAGNOSIS — Z4781 Encounter for orthopedic aftercare following surgical amputation: Secondary | ICD-10-CM | POA: Diagnosis not present

## 2020-01-19 DIAGNOSIS — E1165 Type 2 diabetes mellitus with hyperglycemia: Secondary | ICD-10-CM | POA: Diagnosis not present

## 2020-01-19 DIAGNOSIS — N184 Chronic kidney disease, stage 4 (severe): Secondary | ICD-10-CM | POA: Diagnosis not present

## 2020-01-19 DIAGNOSIS — E1122 Type 2 diabetes mellitus with diabetic chronic kidney disease: Secondary | ICD-10-CM | POA: Diagnosis not present

## 2020-01-19 DIAGNOSIS — I1310 Hypertensive heart and chronic kidney disease without heart failure, with stage 1 through stage 4 chronic kidney disease, or unspecified chronic kidney disease: Secondary | ICD-10-CM | POA: Diagnosis not present

## 2020-01-19 DIAGNOSIS — E1151 Type 2 diabetes mellitus with diabetic peripheral angiopathy without gangrene: Secondary | ICD-10-CM | POA: Diagnosis not present

## 2020-01-23 DIAGNOSIS — N184 Chronic kidney disease, stage 4 (severe): Secondary | ICD-10-CM | POA: Diagnosis not present

## 2020-01-23 DIAGNOSIS — E1122 Type 2 diabetes mellitus with diabetic chronic kidney disease: Secondary | ICD-10-CM | POA: Diagnosis not present

## 2020-01-23 DIAGNOSIS — Z4781 Encounter for orthopedic aftercare following surgical amputation: Secondary | ICD-10-CM | POA: Diagnosis not present

## 2020-01-23 DIAGNOSIS — I1310 Hypertensive heart and chronic kidney disease without heart failure, with stage 1 through stage 4 chronic kidney disease, or unspecified chronic kidney disease: Secondary | ICD-10-CM | POA: Diagnosis not present

## 2020-01-23 DIAGNOSIS — E1165 Type 2 diabetes mellitus with hyperglycemia: Secondary | ICD-10-CM | POA: Diagnosis not present

## 2020-01-23 DIAGNOSIS — E1151 Type 2 diabetes mellitus with diabetic peripheral angiopathy without gangrene: Secondary | ICD-10-CM | POA: Diagnosis not present

## 2020-01-24 DIAGNOSIS — B460 Pulmonary mucormycosis: Secondary | ICD-10-CM | POA: Diagnosis not present

## 2020-01-24 DIAGNOSIS — B259 Cytomegaloviral disease, unspecified: Secondary | ICD-10-CM | POA: Diagnosis not present

## 2020-01-24 DIAGNOSIS — Z87440 Personal history of urinary (tract) infections: Secondary | ICD-10-CM | POA: Diagnosis not present

## 2020-01-24 DIAGNOSIS — D84821 Immunodeficiency due to drugs: Secondary | ICD-10-CM | POA: Diagnosis not present

## 2020-01-24 DIAGNOSIS — E785 Hyperlipidemia, unspecified: Secondary | ICD-10-CM | POA: Diagnosis not present

## 2020-01-24 DIAGNOSIS — N179 Acute kidney failure, unspecified: Secondary | ICD-10-CM | POA: Diagnosis not present

## 2020-01-24 DIAGNOSIS — D849 Immunodeficiency, unspecified: Secondary | ICD-10-CM | POA: Diagnosis not present

## 2020-01-24 DIAGNOSIS — Z79899 Other long term (current) drug therapy: Secondary | ICD-10-CM | POA: Diagnosis not present

## 2020-01-24 DIAGNOSIS — B465 Mucormycosis, unspecified: Secondary | ICD-10-CM | POA: Diagnosis not present

## 2020-01-24 DIAGNOSIS — I313 Pericardial effusion (noninflammatory): Secondary | ICD-10-CM | POA: Diagnosis not present

## 2020-01-24 DIAGNOSIS — E119 Type 2 diabetes mellitus without complications: Secondary | ICD-10-CM | POA: Diagnosis not present

## 2020-01-24 DIAGNOSIS — Z4822 Encounter for aftercare following kidney transplant: Secondary | ICD-10-CM | POA: Diagnosis not present

## 2020-01-24 DIAGNOSIS — Z5181 Encounter for therapeutic drug level monitoring: Secondary | ICD-10-CM | POA: Diagnosis not present

## 2020-01-24 DIAGNOSIS — Z23 Encounter for immunization: Secondary | ICD-10-CM | POA: Diagnosis not present

## 2020-01-24 DIAGNOSIS — I10 Essential (primary) hypertension: Secondary | ICD-10-CM | POA: Diagnosis not present

## 2020-01-24 DIAGNOSIS — Z940 Kidney transplant status: Secondary | ICD-10-CM | POA: Diagnosis not present

## 2020-01-31 DIAGNOSIS — E1165 Type 2 diabetes mellitus with hyperglycemia: Secondary | ICD-10-CM | POA: Diagnosis not present

## 2020-01-31 DIAGNOSIS — I1310 Hypertensive heart and chronic kidney disease without heart failure, with stage 1 through stage 4 chronic kidney disease, or unspecified chronic kidney disease: Secondary | ICD-10-CM | POA: Diagnosis not present

## 2020-01-31 DIAGNOSIS — N184 Chronic kidney disease, stage 4 (severe): Secondary | ICD-10-CM | POA: Diagnosis not present

## 2020-01-31 DIAGNOSIS — Z4781 Encounter for orthopedic aftercare following surgical amputation: Secondary | ICD-10-CM | POA: Diagnosis not present

## 2020-01-31 DIAGNOSIS — E1122 Type 2 diabetes mellitus with diabetic chronic kidney disease: Secondary | ICD-10-CM | POA: Diagnosis not present

## 2020-01-31 DIAGNOSIS — E1151 Type 2 diabetes mellitus with diabetic peripheral angiopathy without gangrene: Secondary | ICD-10-CM | POA: Diagnosis not present

## 2020-02-06 DIAGNOSIS — I1310 Hypertensive heart and chronic kidney disease without heart failure, with stage 1 through stage 4 chronic kidney disease, or unspecified chronic kidney disease: Secondary | ICD-10-CM | POA: Diagnosis not present

## 2020-02-06 DIAGNOSIS — E1122 Type 2 diabetes mellitus with diabetic chronic kidney disease: Secondary | ICD-10-CM | POA: Diagnosis not present

## 2020-02-06 DIAGNOSIS — N184 Chronic kidney disease, stage 4 (severe): Secondary | ICD-10-CM | POA: Diagnosis not present

## 2020-02-06 DIAGNOSIS — E1165 Type 2 diabetes mellitus with hyperglycemia: Secondary | ICD-10-CM | POA: Diagnosis not present

## 2020-02-06 DIAGNOSIS — Z4781 Encounter for orthopedic aftercare following surgical amputation: Secondary | ICD-10-CM | POA: Diagnosis not present

## 2020-02-06 DIAGNOSIS — E1151 Type 2 diabetes mellitus with diabetic peripheral angiopathy without gangrene: Secondary | ICD-10-CM | POA: Diagnosis not present

## 2020-02-09 ENCOUNTER — Telehealth: Payer: Self-pay | Admitting: Family Medicine

## 2020-02-09 DIAGNOSIS — E1122 Type 2 diabetes mellitus with diabetic chronic kidney disease: Secondary | ICD-10-CM | POA: Diagnosis not present

## 2020-02-09 DIAGNOSIS — E1151 Type 2 diabetes mellitus with diabetic peripheral angiopathy without gangrene: Secondary | ICD-10-CM | POA: Diagnosis not present

## 2020-02-09 DIAGNOSIS — Z4781 Encounter for orthopedic aftercare following surgical amputation: Secondary | ICD-10-CM | POA: Diagnosis not present

## 2020-02-09 DIAGNOSIS — I1310 Hypertensive heart and chronic kidney disease without heart failure, with stage 1 through stage 4 chronic kidney disease, or unspecified chronic kidney disease: Secondary | ICD-10-CM | POA: Diagnosis not present

## 2020-02-09 DIAGNOSIS — E1165 Type 2 diabetes mellitus with hyperglycemia: Secondary | ICD-10-CM | POA: Diagnosis not present

## 2020-02-09 DIAGNOSIS — N184 Chronic kidney disease, stage 4 (severe): Secondary | ICD-10-CM | POA: Diagnosis not present

## 2020-02-09 NOTE — Telephone Encounter (Signed)
Plz do so 

## 2020-02-09 NOTE — Telephone Encounter (Signed)
Verbal order given for continuation of PT

## 2020-02-09 NOTE — Telephone Encounter (Signed)
Shelley [name] (pronounced Nida) from [facility] calling to request PT orders from provider. They would like to continue PT once a week for 8 weeks for her prosthetic training. Please advise. Thank you  [phone]

## 2020-02-12 DIAGNOSIS — E1165 Type 2 diabetes mellitus with hyperglycemia: Secondary | ICD-10-CM | POA: Diagnosis not present

## 2020-02-12 DIAGNOSIS — N184 Chronic kidney disease, stage 4 (severe): Secondary | ICD-10-CM | POA: Diagnosis not present

## 2020-02-12 DIAGNOSIS — Z4781 Encounter for orthopedic aftercare following surgical amputation: Secondary | ICD-10-CM | POA: Diagnosis not present

## 2020-02-12 DIAGNOSIS — I1310 Hypertensive heart and chronic kidney disease without heart failure, with stage 1 through stage 4 chronic kidney disease, or unspecified chronic kidney disease: Secondary | ICD-10-CM | POA: Diagnosis not present

## 2020-02-12 DIAGNOSIS — E1122 Type 2 diabetes mellitus with diabetic chronic kidney disease: Secondary | ICD-10-CM | POA: Diagnosis not present

## 2020-02-12 DIAGNOSIS — E1151 Type 2 diabetes mellitus with diabetic peripheral angiopathy without gangrene: Secondary | ICD-10-CM | POA: Diagnosis not present

## 2020-02-13 DIAGNOSIS — I1310 Hypertensive heart and chronic kidney disease without heart failure, with stage 1 through stage 4 chronic kidney disease, or unspecified chronic kidney disease: Secondary | ICD-10-CM | POA: Diagnosis not present

## 2020-02-13 DIAGNOSIS — E1165 Type 2 diabetes mellitus with hyperglycemia: Secondary | ICD-10-CM | POA: Diagnosis not present

## 2020-02-13 DIAGNOSIS — E1151 Type 2 diabetes mellitus with diabetic peripheral angiopathy without gangrene: Secondary | ICD-10-CM | POA: Diagnosis not present

## 2020-02-13 DIAGNOSIS — N184 Chronic kidney disease, stage 4 (severe): Secondary | ICD-10-CM | POA: Diagnosis not present

## 2020-02-13 DIAGNOSIS — E1122 Type 2 diabetes mellitus with diabetic chronic kidney disease: Secondary | ICD-10-CM | POA: Diagnosis not present

## 2020-02-13 DIAGNOSIS — Z4781 Encounter for orthopedic aftercare following surgical amputation: Secondary | ICD-10-CM | POA: Diagnosis not present

## 2020-02-14 DIAGNOSIS — Z940 Kidney transplant status: Secondary | ICD-10-CM | POA: Diagnosis not present

## 2020-02-14 DIAGNOSIS — Z794 Long term (current) use of insulin: Secondary | ICD-10-CM | POA: Diagnosis not present

## 2020-02-14 DIAGNOSIS — I422 Other hypertrophic cardiomyopathy: Secondary | ICD-10-CM | POA: Diagnosis not present

## 2020-02-14 DIAGNOSIS — I1310 Hypertensive heart and chronic kidney disease without heart failure, with stage 1 through stage 4 chronic kidney disease, or unspecified chronic kidney disease: Secondary | ICD-10-CM | POA: Diagnosis not present

## 2020-02-14 DIAGNOSIS — N184 Chronic kidney disease, stage 4 (severe): Secondary | ICD-10-CM | POA: Diagnosis not present

## 2020-02-14 DIAGNOSIS — S91201D Unspecified open wound of right great toe with damage to nail, subsequent encounter: Secondary | ICD-10-CM | POA: Diagnosis not present

## 2020-02-14 DIAGNOSIS — E1122 Type 2 diabetes mellitus with diabetic chronic kidney disease: Secondary | ICD-10-CM | POA: Diagnosis not present

## 2020-02-14 DIAGNOSIS — E1151 Type 2 diabetes mellitus with diabetic peripheral angiopathy without gangrene: Secondary | ICD-10-CM | POA: Diagnosis not present

## 2020-02-14 DIAGNOSIS — Z89512 Acquired absence of left leg below knee: Secondary | ICD-10-CM | POA: Diagnosis not present

## 2020-02-20 DIAGNOSIS — E1151 Type 2 diabetes mellitus with diabetic peripheral angiopathy without gangrene: Secondary | ICD-10-CM | POA: Diagnosis not present

## 2020-02-20 DIAGNOSIS — E1122 Type 2 diabetes mellitus with diabetic chronic kidney disease: Secondary | ICD-10-CM | POA: Diagnosis not present

## 2020-02-20 DIAGNOSIS — I1310 Hypertensive heart and chronic kidney disease without heart failure, with stage 1 through stage 4 chronic kidney disease, or unspecified chronic kidney disease: Secondary | ICD-10-CM | POA: Diagnosis not present

## 2020-02-20 DIAGNOSIS — Z89512 Acquired absence of left leg below knee: Secondary | ICD-10-CM | POA: Diagnosis not present

## 2020-02-20 DIAGNOSIS — N184 Chronic kidney disease, stage 4 (severe): Secondary | ICD-10-CM | POA: Diagnosis not present

## 2020-02-20 DIAGNOSIS — I422 Other hypertrophic cardiomyopathy: Secondary | ICD-10-CM | POA: Diagnosis not present

## 2020-02-21 DIAGNOSIS — E1165 Type 2 diabetes mellitus with hyperglycemia: Secondary | ICD-10-CM | POA: Diagnosis not present

## 2020-02-21 DIAGNOSIS — C649 Malignant neoplasm of unspecified kidney, except renal pelvis: Secondary | ICD-10-CM | POA: Diagnosis not present

## 2020-02-21 DIAGNOSIS — I10 Essential (primary) hypertension: Secondary | ICD-10-CM | POA: Diagnosis not present

## 2020-02-21 DIAGNOSIS — I1310 Hypertensive heart and chronic kidney disease without heart failure, with stage 1 through stage 4 chronic kidney disease, or unspecified chronic kidney disease: Secondary | ICD-10-CM | POA: Diagnosis not present

## 2020-02-21 DIAGNOSIS — E1351 Other specified diabetes mellitus with diabetic peripheral angiopathy without gangrene: Secondary | ICD-10-CM | POA: Diagnosis not present

## 2020-02-21 DIAGNOSIS — N184 Chronic kidney disease, stage 4 (severe): Secondary | ICD-10-CM | POA: Diagnosis not present

## 2020-02-21 DIAGNOSIS — I441 Atrioventricular block, second degree: Secondary | ICD-10-CM | POA: Diagnosis not present

## 2020-02-21 DIAGNOSIS — J90 Pleural effusion, not elsewhere classified: Secondary | ICD-10-CM | POA: Diagnosis not present

## 2020-02-21 DIAGNOSIS — E1122 Type 2 diabetes mellitus with diabetic chronic kidney disease: Secondary | ICD-10-CM | POA: Diagnosis not present

## 2020-02-21 DIAGNOSIS — Z01818 Encounter for other preprocedural examination: Secondary | ICD-10-CM | POA: Diagnosis not present

## 2020-02-21 DIAGNOSIS — D849 Immunodeficiency, unspecified: Secondary | ICD-10-CM | POA: Diagnosis not present

## 2020-02-21 DIAGNOSIS — Z89512 Acquired absence of left leg below knee: Secondary | ICD-10-CM | POA: Diagnosis not present

## 2020-02-21 DIAGNOSIS — I70229 Atherosclerosis of native arteries of extremities with rest pain, unspecified extremity: Secondary | ICD-10-CM | POA: Diagnosis not present

## 2020-02-21 DIAGNOSIS — I422 Other hypertrophic cardiomyopathy: Secondary | ICD-10-CM | POA: Diagnosis not present

## 2020-02-21 DIAGNOSIS — M86672 Other chronic osteomyelitis, left ankle and foot: Secondary | ICD-10-CM | POA: Diagnosis not present

## 2020-02-21 DIAGNOSIS — E1151 Type 2 diabetes mellitus with diabetic peripheral angiopathy without gangrene: Secondary | ICD-10-CM | POA: Diagnosis not present

## 2020-02-22 DIAGNOSIS — Z940 Kidney transplant status: Secondary | ICD-10-CM | POA: Diagnosis not present

## 2020-02-26 DIAGNOSIS — I422 Other hypertrophic cardiomyopathy: Secondary | ICD-10-CM | POA: Diagnosis not present

## 2020-02-26 DIAGNOSIS — I10 Essential (primary) hypertension: Secondary | ICD-10-CM | POA: Diagnosis not present

## 2020-02-26 DIAGNOSIS — I739 Peripheral vascular disease, unspecified: Secondary | ICD-10-CM | POA: Diagnosis not present

## 2020-02-27 DIAGNOSIS — Z841 Family history of disorders of kidney and ureter: Secondary | ICD-10-CM | POA: Diagnosis not present

## 2020-02-27 DIAGNOSIS — C642 Malignant neoplasm of left kidney, except renal pelvis: Secondary | ICD-10-CM | POA: Diagnosis not present

## 2020-02-27 DIAGNOSIS — Z902 Acquired absence of lung [part of]: Secondary | ICD-10-CM | POA: Diagnosis not present

## 2020-02-27 DIAGNOSIS — I441 Atrioventricular block, second degree: Secondary | ICD-10-CM | POA: Diagnosis present

## 2020-02-27 DIAGNOSIS — Z79899 Other long term (current) drug therapy: Secondary | ICD-10-CM | POA: Diagnosis not present

## 2020-02-27 DIAGNOSIS — Z7902 Long term (current) use of antithrombotics/antiplatelets: Secondary | ICD-10-CM | POA: Diagnosis not present

## 2020-02-27 DIAGNOSIS — F4321 Adjustment disorder with depressed mood: Secondary | ICD-10-CM | POA: Diagnosis present

## 2020-02-27 DIAGNOSIS — Z20822 Contact with and (suspected) exposure to covid-19: Secondary | ICD-10-CM | POA: Diagnosis present

## 2020-02-27 DIAGNOSIS — Z7982 Long term (current) use of aspirin: Secondary | ICD-10-CM | POA: Diagnosis not present

## 2020-02-27 DIAGNOSIS — E782 Mixed hyperlipidemia: Secondary | ICD-10-CM | POA: Diagnosis present

## 2020-02-27 DIAGNOSIS — D849 Immunodeficiency, unspecified: Secondary | ICD-10-CM | POA: Diagnosis not present

## 2020-02-27 DIAGNOSIS — E785 Hyperlipidemia, unspecified: Secondary | ICD-10-CM | POA: Diagnosis not present

## 2020-02-27 DIAGNOSIS — Z940 Kidney transplant status: Secondary | ICD-10-CM | POA: Diagnosis not present

## 2020-02-27 DIAGNOSIS — I10 Essential (primary) hypertension: Secondary | ICD-10-CM | POA: Diagnosis not present

## 2020-02-27 DIAGNOSIS — E119 Type 2 diabetes mellitus without complications: Secondary | ICD-10-CM | POA: Diagnosis not present

## 2020-02-27 DIAGNOSIS — R918 Other nonspecific abnormal finding of lung field: Secondary | ICD-10-CM | POA: Diagnosis not present

## 2020-02-27 DIAGNOSIS — Z833 Family history of diabetes mellitus: Secondary | ICD-10-CM | POA: Diagnosis not present

## 2020-02-27 DIAGNOSIS — C641 Malignant neoplasm of right kidney, except renal pelvis: Secondary | ICD-10-CM | POA: Diagnosis not present

## 2020-02-27 DIAGNOSIS — I129 Hypertensive chronic kidney disease with stage 1 through stage 4 chronic kidney disease, or unspecified chronic kidney disease: Secondary | ICD-10-CM | POA: Diagnosis present

## 2020-02-27 DIAGNOSIS — E1151 Type 2 diabetes mellitus with diabetic peripheral angiopathy without gangrene: Secondary | ICD-10-CM | POA: Diagnosis present

## 2020-02-27 DIAGNOSIS — I422 Other hypertrophic cardiomyopathy: Secondary | ICD-10-CM | POA: Diagnosis not present

## 2020-02-27 DIAGNOSIS — D84821 Immunodeficiency due to drugs: Secondary | ICD-10-CM | POA: Diagnosis present

## 2020-02-27 DIAGNOSIS — N189 Chronic kidney disease, unspecified: Secondary | ICD-10-CM | POA: Diagnosis present

## 2020-02-27 DIAGNOSIS — Z794 Long term (current) use of insulin: Secondary | ICD-10-CM | POA: Diagnosis not present

## 2020-02-27 DIAGNOSIS — E118 Type 2 diabetes mellitus with unspecified complications: Secondary | ICD-10-CM | POA: Diagnosis not present

## 2020-02-27 DIAGNOSIS — I160 Hypertensive urgency: Secondary | ICD-10-CM | POA: Diagnosis not present

## 2020-02-27 DIAGNOSIS — I421 Obstructive hypertrophic cardiomyopathy: Secondary | ICD-10-CM | POA: Diagnosis present

## 2020-02-27 DIAGNOSIS — T380X5A Adverse effect of glucocorticoids and synthetic analogues, initial encounter: Secondary | ICD-10-CM | POA: Diagnosis not present

## 2020-02-27 DIAGNOSIS — Z89512 Acquired absence of left leg below knee: Secondary | ICD-10-CM | POA: Diagnosis not present

## 2020-02-27 DIAGNOSIS — Z90710 Acquired absence of both cervix and uterus: Secondary | ICD-10-CM | POA: Diagnosis not present

## 2020-02-27 DIAGNOSIS — E1122 Type 2 diabetes mellitus with diabetic chronic kidney disease: Secondary | ICD-10-CM | POA: Diagnosis present

## 2020-02-27 DIAGNOSIS — E1165 Type 2 diabetes mellitus with hyperglycemia: Secondary | ICD-10-CM | POA: Diagnosis not present

## 2020-02-27 DIAGNOSIS — R9431 Abnormal electrocardiogram [ECG] [EKG]: Secondary | ICD-10-CM | POA: Diagnosis not present

## 2020-02-28 DIAGNOSIS — E118 Type 2 diabetes mellitus with unspecified complications: Secondary | ICD-10-CM | POA: Diagnosis not present

## 2020-02-28 DIAGNOSIS — I10 Essential (primary) hypertension: Secondary | ICD-10-CM | POA: Diagnosis not present

## 2020-02-28 DIAGNOSIS — I422 Other hypertrophic cardiomyopathy: Secondary | ICD-10-CM | POA: Diagnosis not present

## 2020-02-28 DIAGNOSIS — D849 Immunodeficiency, unspecified: Secondary | ICD-10-CM | POA: Diagnosis not present

## 2020-02-28 DIAGNOSIS — Z940 Kidney transplant status: Secondary | ICD-10-CM | POA: Diagnosis not present

## 2020-02-29 DIAGNOSIS — I10 Essential (primary) hypertension: Secondary | ICD-10-CM | POA: Diagnosis not present

## 2020-02-29 DIAGNOSIS — E118 Type 2 diabetes mellitus with unspecified complications: Secondary | ICD-10-CM | POA: Diagnosis not present

## 2020-02-29 DIAGNOSIS — D849 Immunodeficiency, unspecified: Secondary | ICD-10-CM | POA: Diagnosis not present

## 2020-02-29 DIAGNOSIS — I422 Other hypertrophic cardiomyopathy: Secondary | ICD-10-CM | POA: Diagnosis not present

## 2020-02-29 DIAGNOSIS — Z940 Kidney transplant status: Secondary | ICD-10-CM | POA: Diagnosis not present

## 2020-03-03 DIAGNOSIS — I422 Other hypertrophic cardiomyopathy: Secondary | ICD-10-CM | POA: Diagnosis not present

## 2020-03-03 DIAGNOSIS — E1122 Type 2 diabetes mellitus with diabetic chronic kidney disease: Secondary | ICD-10-CM | POA: Diagnosis not present

## 2020-03-03 DIAGNOSIS — N184 Chronic kidney disease, stage 4 (severe): Secondary | ICD-10-CM | POA: Diagnosis not present

## 2020-03-03 DIAGNOSIS — I1310 Hypertensive heart and chronic kidney disease without heart failure, with stage 1 through stage 4 chronic kidney disease, or unspecified chronic kidney disease: Secondary | ICD-10-CM | POA: Diagnosis not present

## 2020-03-03 DIAGNOSIS — E1151 Type 2 diabetes mellitus with diabetic peripheral angiopathy without gangrene: Secondary | ICD-10-CM | POA: Diagnosis not present

## 2020-03-03 DIAGNOSIS — Z89512 Acquired absence of left leg below knee: Secondary | ICD-10-CM | POA: Diagnosis not present

## 2020-03-08 DIAGNOSIS — Z89512 Acquired absence of left leg below knee: Secondary | ICD-10-CM | POA: Diagnosis not present

## 2020-03-08 DIAGNOSIS — E1122 Type 2 diabetes mellitus with diabetic chronic kidney disease: Secondary | ICD-10-CM | POA: Diagnosis not present

## 2020-03-08 DIAGNOSIS — I1310 Hypertensive heart and chronic kidney disease without heart failure, with stage 1 through stage 4 chronic kidney disease, or unspecified chronic kidney disease: Secondary | ICD-10-CM | POA: Diagnosis not present

## 2020-03-08 DIAGNOSIS — N184 Chronic kidney disease, stage 4 (severe): Secondary | ICD-10-CM | POA: Diagnosis not present

## 2020-03-08 DIAGNOSIS — I422 Other hypertrophic cardiomyopathy: Secondary | ICD-10-CM | POA: Diagnosis not present

## 2020-03-08 DIAGNOSIS — E1151 Type 2 diabetes mellitus with diabetic peripheral angiopathy without gangrene: Secondary | ICD-10-CM | POA: Diagnosis not present

## 2020-03-13 DIAGNOSIS — N184 Chronic kidney disease, stage 4 (severe): Secondary | ICD-10-CM | POA: Diagnosis not present

## 2020-03-13 DIAGNOSIS — I422 Other hypertrophic cardiomyopathy: Secondary | ICD-10-CM | POA: Diagnosis not present

## 2020-03-13 DIAGNOSIS — E1151 Type 2 diabetes mellitus with diabetic peripheral angiopathy without gangrene: Secondary | ICD-10-CM | POA: Diagnosis not present

## 2020-03-13 DIAGNOSIS — Z89512 Acquired absence of left leg below knee: Secondary | ICD-10-CM | POA: Diagnosis not present

## 2020-03-13 DIAGNOSIS — I1310 Hypertensive heart and chronic kidney disease without heart failure, with stage 1 through stage 4 chronic kidney disease, or unspecified chronic kidney disease: Secondary | ICD-10-CM | POA: Diagnosis not present

## 2020-03-13 DIAGNOSIS — E1122 Type 2 diabetes mellitus with diabetic chronic kidney disease: Secondary | ICD-10-CM | POA: Diagnosis not present

## 2020-03-14 DIAGNOSIS — Z89512 Acquired absence of left leg below knee: Secondary | ICD-10-CM | POA: Diagnosis not present

## 2020-03-14 DIAGNOSIS — I422 Other hypertrophic cardiomyopathy: Secondary | ICD-10-CM | POA: Diagnosis not present

## 2020-03-14 DIAGNOSIS — E1122 Type 2 diabetes mellitus with diabetic chronic kidney disease: Secondary | ICD-10-CM | POA: Diagnosis not present

## 2020-03-14 DIAGNOSIS — I1310 Hypertensive heart and chronic kidney disease without heart failure, with stage 1 through stage 4 chronic kidney disease, or unspecified chronic kidney disease: Secondary | ICD-10-CM | POA: Diagnosis not present

## 2020-03-14 DIAGNOSIS — E1151 Type 2 diabetes mellitus with diabetic peripheral angiopathy without gangrene: Secondary | ICD-10-CM | POA: Diagnosis not present

## 2020-03-14 DIAGNOSIS — N184 Chronic kidney disease, stage 4 (severe): Secondary | ICD-10-CM | POA: Diagnosis not present

## 2020-03-15 DIAGNOSIS — Z483 Aftercare following surgery for neoplasm: Secondary | ICD-10-CM | POA: Diagnosis not present

## 2020-04-12 ENCOUNTER — Telehealth: Payer: Self-pay | Admitting: *Deleted

## 2020-04-12 NOTE — Telephone Encounter (Signed)
PT calling for verbal order for prostetic training to continue 1 time a week for 8 weeks.  [phone]

## 2020-04-15 NOTE — Telephone Encounter (Signed)
Verbal order given to PT

## 2020-04-15 NOTE — Telephone Encounter (Signed)
May have verbal order for this 

## 2020-04-23 DIAGNOSIS — I1310 Hypertensive heart and chronic kidney disease without heart failure, with stage 1 through stage 4 chronic kidney disease, or unspecified chronic kidney disease: Secondary | ICD-10-CM | POA: Diagnosis not present

## 2020-04-23 DIAGNOSIS — Z483 Aftercare following surgery for neoplasm: Secondary | ICD-10-CM

## 2020-04-23 DIAGNOSIS — Z905 Acquired absence of kidney: Secondary | ICD-10-CM

## 2020-05-16 ENCOUNTER — Telehealth: Payer: Self-pay

## 2020-05-16 DIAGNOSIS — S88919A Complete traumatic amputation of unspecified lower leg, level unspecified, initial encounter: Secondary | ICD-10-CM

## 2020-05-16 NOTE — Telephone Encounter (Signed)
Pt states it is for her leg amputation

## 2020-05-16 NOTE — Telephone Encounter (Signed)
She finished home healthhome physical therapy needs more advance therapy in out patient therapy [place] phone [phone] Shelley [name] is who she wants to see

## 2020-05-16 NOTE — Telephone Encounter (Signed)
Please do for that reason  Pt sgould do follow up in the spring with [place]

## 2020-05-16 NOTE — Telephone Encounter (Signed)
I am fine with ordering this Ideally we would like to know the reason for the physical therapy so we can put this with the referral thank you

## 2020-05-17 NOTE — Addendum Note (Signed)
Addended by: Vicente Males on: 05/17/2020 08:56 AM   Modules accepted: Orders

## 2020-05-17 NOTE — Telephone Encounter (Signed)
PT referral placed and pt is aware.  Pt wanted me to tell Dr.Scott "Thank you Thank you Thank you".

## 2020-05-22 IMAGING — DX DG CHEST 2V
2 series · 2 of 2 positions shown · non-contrast
Comparison: None.

CLINICAL DATA: Right-sided chest pain with nonproductive cough,
severe shortness of breath and weakness for 2 weeks. History of
kidney transplant five months ago.

EXAM:
CHEST - 2 VIEW

[chest pa]
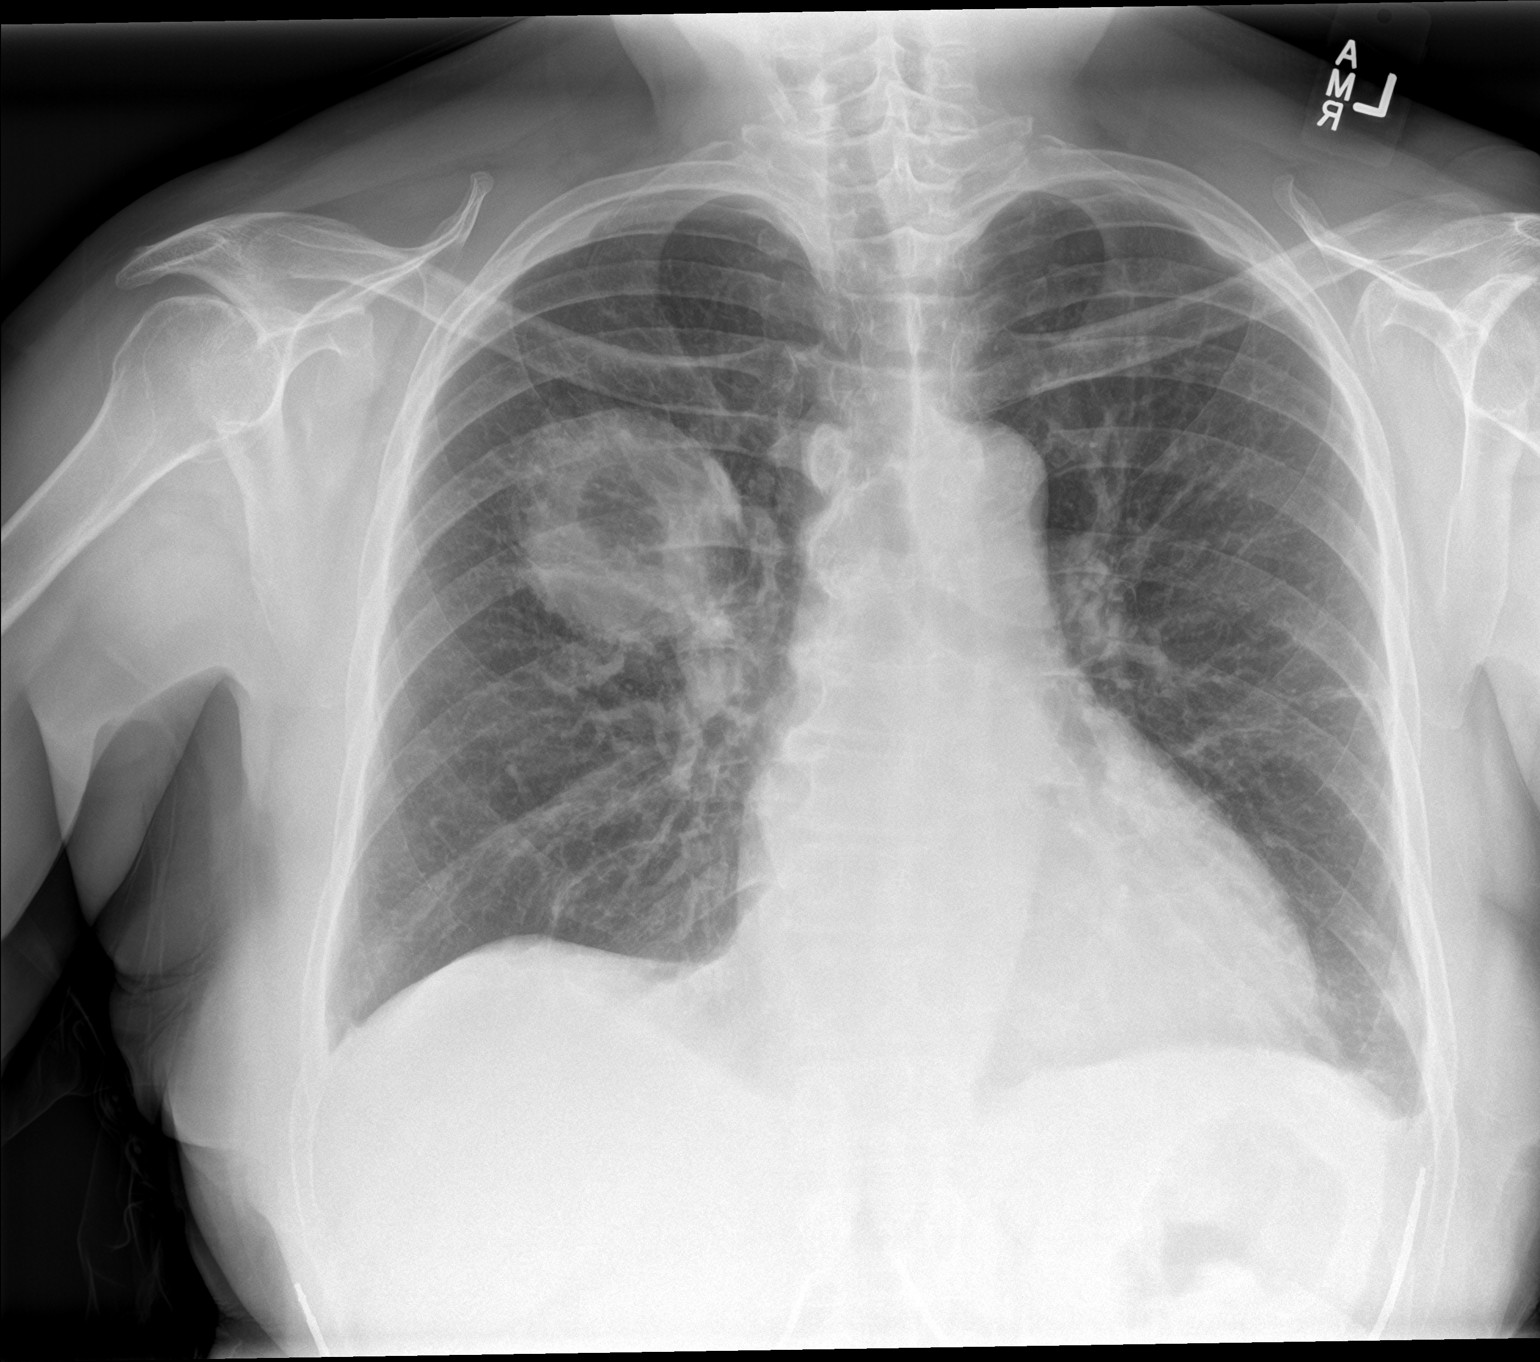

[chest lat]
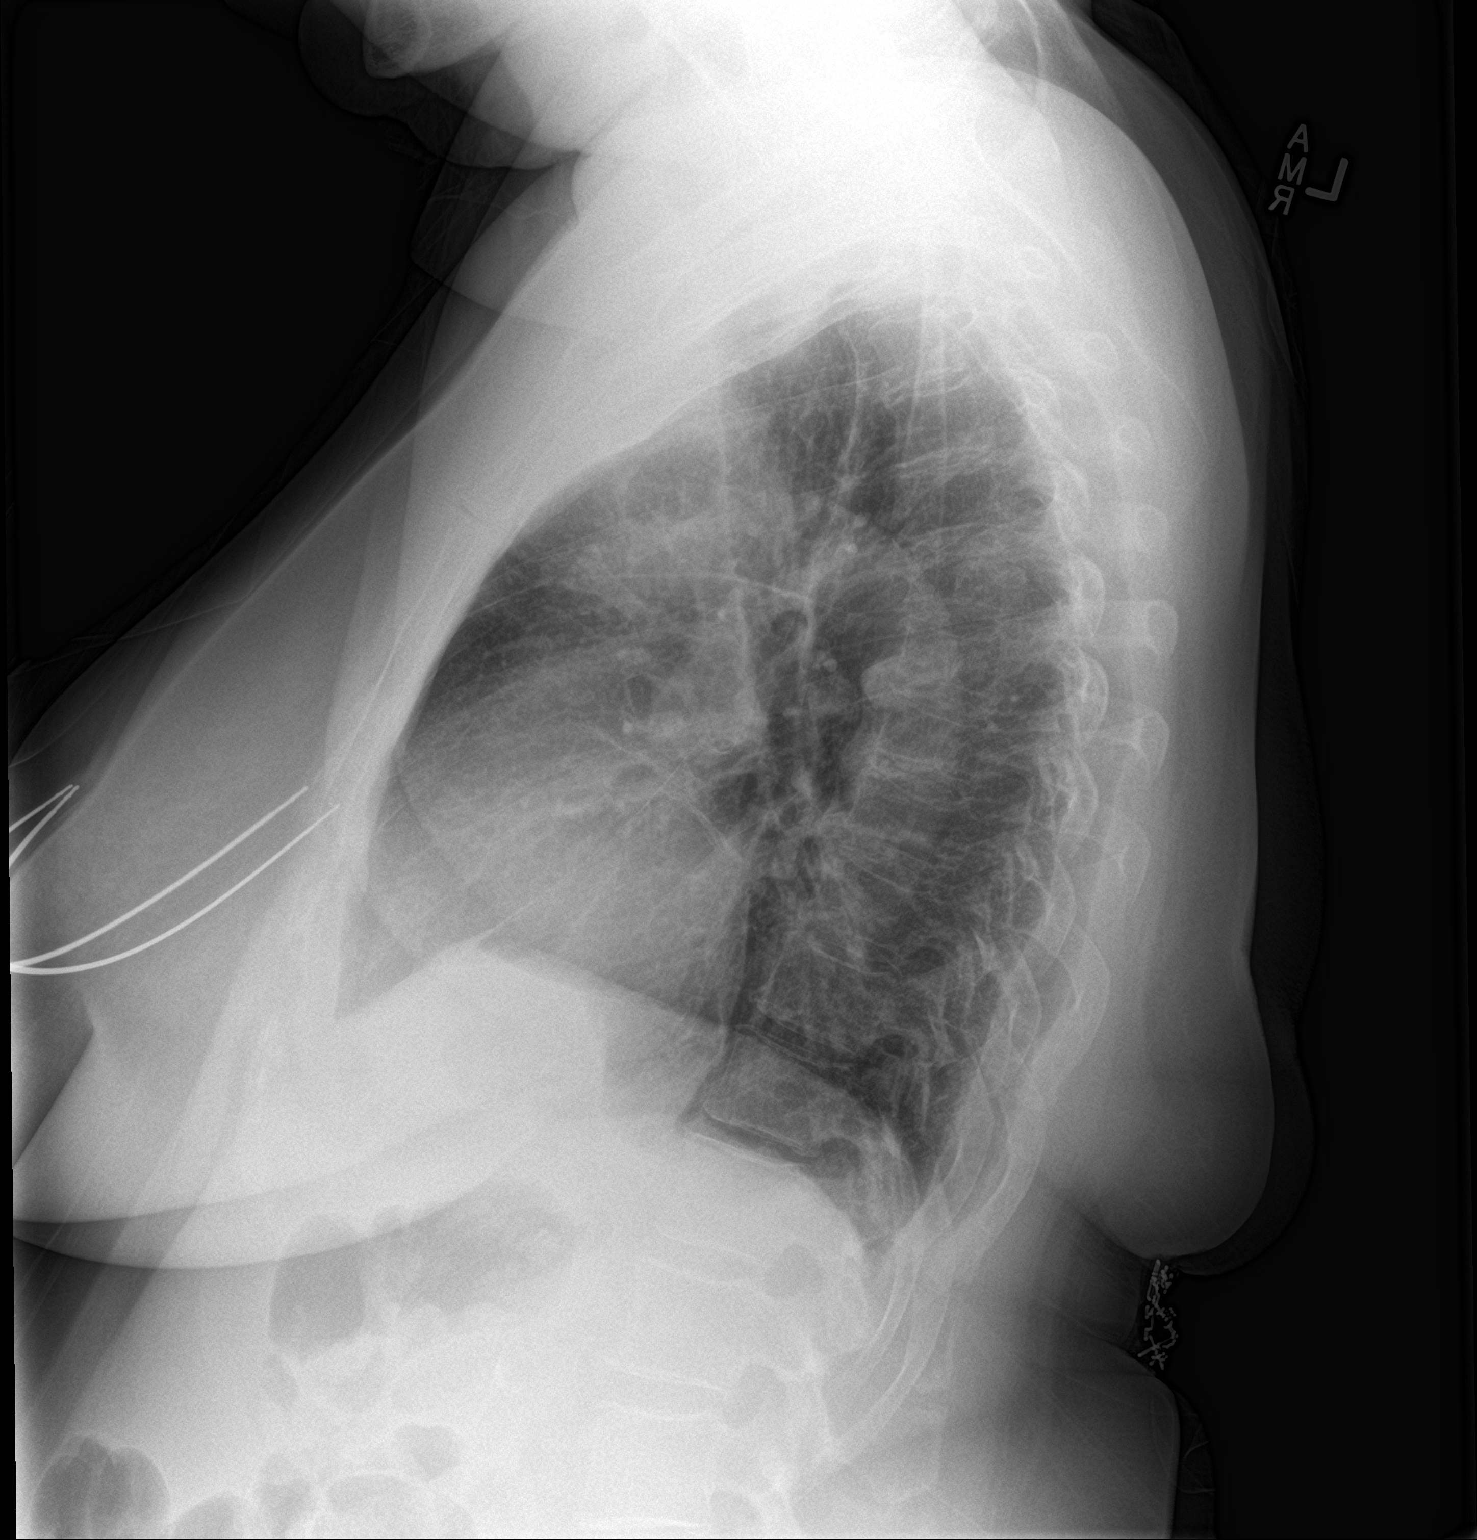

[2 of 2 positions shown; findings below may reference images not displayed]

FINDINGS: The heart size and mediastinal contours are normal. There is a large
centrally cavitary mass anteriorly in the right upper lobe, best
seen on the frontal examination and measuring up to 6.4 x 6.2 cm
(not corrected for magnification). This appears relatively
well-circumscribed, although is less well-defined laterally and on
the lateral view. The lungs are otherwise clear. There is no pleural
effusion or pneumothorax. The bones appear unremarkable.
IMPRESSION: Large cavitary mass anteriorly in the right upper lobe, most
consistent with an abscess in this clinical context. Cavitary
neoplasm less likely. Chest CT recommended for further evaluation.

These results will be called to the ordering clinician or
representative by the Radiologist Assistant, and communication
documented in the PACS or zVision Dashboard.

## 2020-06-05 ENCOUNTER — Other Ambulatory Visit: Payer: Self-pay

## 2020-06-05 ENCOUNTER — Encounter: Payer: Self-pay | Admitting: Physical Therapy

## 2020-06-05 ENCOUNTER — Ambulatory Visit (INDEPENDENT_AMBULATORY_CARE_PROVIDER_SITE_OTHER): Payer: Medicare Other | Admitting: Physical Therapy

## 2020-06-05 DIAGNOSIS — M6281 Muscle weakness (generalized): Secondary | ICD-10-CM

## 2020-06-05 DIAGNOSIS — R293 Abnormal posture: Secondary | ICD-10-CM

## 2020-06-05 DIAGNOSIS — R2681 Unsteadiness on feet: Secondary | ICD-10-CM

## 2020-06-05 DIAGNOSIS — R2689 Other abnormalities of gait and mobility: Secondary | ICD-10-CM | POA: Diagnosis not present

## 2020-06-05 NOTE — Therapy (Signed)
[facility] [address] [place], [place], [address] Phone: [phone]   Fax:  [phone]  Physical Therapy Evaluation  Patient Details  Name: Shelley [name] MRN: [id number] Date of Birth: June 10, 1950 Referring Provider (PT): Sallee Lange, MD   Encounter Date: 06/05/2020   PT End of Session - 06/05/20 1222    Visit Number 1    Number of Visits 25    Date for PT Re-Evaluation 09/03/20    Authorization Type Medicare & BCBS supp    PT Start Time 0930    PT Stop Time 1015    PT Time Calculation (min) 45 min    Equipment Utilized During Treatment Gait belt    Activity Tolerance Patient tolerated treatment well    Behavior During Therapy [facility] for tasks assessed/performed           Past Medical History:  Diagnosis Date  . Diabetes ([place])   . Diabetes mellitus without complication ([place])   . End stage renal disease ([place])   . HTN (hypertension)   . Hyperlipidemia   . Hypertension     Past Surgical History:  Procedure Laterality Date  . ABDOMINAL HYSTERECTOMY     fibroids/complete hysterec 2000  . APPENDECTOMY    . CATARACT EXTRACTION Bilateral   . COLONOSCOPY N/A 03/12/2016   Procedure: COLONOSCOPY;  Surgeon: Daneil Dolin, MD;  Location: [facility];  Service: Endoscopy;  Laterality: N/A;  9:30 am  . POLYPECTOMY  03/12/2016   Procedure: POLYPECTOMY;  Surgeon: Daneil Dolin, MD;  Location: [facility] END[facility] SU[facility];  Service: Endoscopy;;  colon  . YAG LASER APPLICATION Left 95/04/8411   Procedure: YAG LASER APPLICATION;  Surgeon: Williams Che, MD;  Location: [facility] ORS;  Service: Ophthalmology;  Laterality: Left;    There were no vitals filed for this visit.    Subjective Assessment - 06/05/20 0936    Subjective This 70yo female was referred on 05/17/2020 to PT by Sallee Lange, MD with [phone] (ICD-10-CM) - Amputation of leg. 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  She has a wound on right hallux. She received her prosthesis 01/16/2020 with PT at home until 3 weeks ago.    Pertinent History CKD st 5, DM2, ESRD, HTN, renal CA, kidney transplant 08/30/2017, Thoracotomy with lobectomy 02/24/2018,    Patient Stated Goals to be active like previously including working out (treadmill, weight training)    Currently in Pain? No/denies              [facility] PT Assessment - 06/05/20 0930      Assessment   Medical Diagnosis Left Transtibial Amputation    Referring Provider (PT) Sallee Lange, MD    Onset Date/Surgical Date 05/17/20   PT referral   Hand Dominance Right    Prior Therapy HHPT      Precautions   Precautions Fall    Precaution Comments NO BP LUE      Balance Screen   Has the patient fallen in the past 6 months No    Has the patient had a decrease in activity level because of a fear of falling?  Yes    Is the patient reluctant to leave their home because of a fear of falling?  No      Home Environment   Living Environment Private residence    Living Arrangements Alone   no pets   Available Help at Discharge Family    Type of [place]  Home Access Stairs to enter    Entrance Stairs-Number of Steps 4   garage 4 steps, front has 2 steps right rail   Entrance Stairs-Rails Right    Home Layout One level    [place] - 2 wheels;Cane - single point;Tub bench;Grab bars - toilet;Wheelchair - manual      Prior Function   Level of Independence Independent;Independent with household mobility without device;Independent with community mobility without device    Vocation Retired    Leisure working out at gym, Set designer Postural limitations    Postural Limitations Rounded Shoulders;Forward head;Flexed trunk;Weight shift right      ROM / Strength   AROM / PROM / Strength AROM;Strength      AROM   Overall AROM  Within functional limits for tasks performed      Strength    Overall Strength Within functional limits for tasks performed      Transfers   Transfers Sit to Stand;Stand to Sit    Sit to Stand 6: Modified independent (Device/Increase time);With upper extremity assist;With armrests;From chair/3-in-1   uses back of legs against chair or external support to stabilize   Stand to Sit 6: Modified independent (Device/Increase time);With upper extremity assist;With armrests;To chair/3-in-1;Other (comment)   uses back of legs against chair or external support to stabilize     Ambulation/Gait   Ambulation/Gait Yes    Ambulation/Gait Assistance 4: Min assist;6: Modified independent (Device/Increase time)   MinA cane & mod ind RW   Ambulation Distance (Feet) 100 Feet   enter/exit with RW >200' & assess cane in clinic   Assistive device Prosthesis;Straight cane;Rolling walker    Gait Pattern Step-through pattern;Decreased arm swing - left;Decreased step length - right;Decreased stance time - left;Decreased weight shift to left;Antalgic;Lateral hip instability;Abducted - left    Ambulation Surface Level;Indoor    Gait velocity 1.57 ft/sec with cane & prosthesis    Curb 3: Mod assist   cane & prosthesis     Standardized Balance Assessment   Standardized Balance Assessment Berg Balance Test;Dynamic Gait Index      Berg Balance Test   Sit to Stand Able to stand  independently using hands    Standing Unsupported Able to stand safely 2 minutes    Sitting with Back Unsupported but Feet Supported on Floor or Stool Able to sit safely and securely 2 minutes    Stand to Sit Controls descent by using hands    Transfers Able to transfer safely, minor use of hands    Standing Unsupported with Eyes Closed Able to stand 10 seconds with supervision    Standing Unsupported with Feet Together Able to place feet together independently and stand for 1 minute with supervision    From Standing, Reach Forward with Outstretched Arm Can reach forward >12 cm safely (5")    From Standing  Position, Pick up Object from Floor Able to pick up shoe, needs supervision    From Standing Position, Turn to Look Behind Over each Shoulder Turn sideways only but maintains balance    Turn 360 Degrees Needs assistance while turning    Standing Unsupported, Alternately Place Feet on Step/Stool Able to complete >2 steps/needs minimal assist    Standing Unsupported, One Foot in Front Needs help to step but can hold 15 seconds    Standing on One Leg Tries to lift leg/unable to hold 3 seconds but remains standing independently    Total Score  35      Dynamic Gait Index   Level Surface Moderate Impairment    Change in Gait Speed Severe Impairment    Gait with Horizontal Head Turns Moderate Impairment    Gait with Vertical Head Turns Moderate Impairment    Gait and Pivot Turn Moderate Impairment    Step Over Obstacle Severe Impairment    Step Around Obstacles Moderate Impairment    Steps Moderate Impairment    Total Score 6           Prosthetics Assessment - 06/05/20 0930      Prosthetics   Prosthetic Care Dependent with Skin check;Residual limb care;Prosthetic cleaning;Correct ply sock adjustment;Proper wear schedule/adjustment    Donning prosthesis  Supervision    Doffing prosthesis  Supervision    Current prosthetic wear tolerance (days/week)  daily    Current prosthetic wear tolerance (#hours/day)  donnes ~1 hour after arising & showering then doffes ~1 hour before bedtime    Current prosthetic weight-bearing tolerance (hours/day)  Pt tolerated standing for 8 min without limb pain.    Edema pitting with 5 sec capillary refill    Residual limb condition  Sweaty with gray overmoist skin, no open areas, cylinderical shape, normal temperature,    Prosthesis Description silicon liner with pin lock suspension, dynamic response foot                     Objective measurements completed on examination: See above findings.       [place] Adult PT Treatment/Exercise - 06/05/20  0930      Prosthetics   Prosthetic Care Comments  PT recommended donning upon arising, removing to shower, apply antiperspirant after shower & redonne, wear until bedtime. Dry limb & liner q4 hrs.    Education Provided Residual limb care;Proper Donning;Proper wear schedule/adjustment;Other (comment)   see prosthetic care comments   Person(s) Educated Patient    Education Method Explanation;Demonstration;Tactile cues;Verbal cues    Education Method Verbalized understanding;Returned demonstration;Tactile cues required;Verbal cues required;Needs further instruction                    PT Short Term Goals - 06/05/20 1245      PT SHORT TERM GOAL #1   Title Patient demonstrates how to properly donne prosthesis and verbalizes signs of sweating inside liner.    Time 4    Period Weeks    Status New    Target Date 07/04/20      PT SHORT TERM GOAL #2   Title Patient able to pick up items from floor without UE support safely.    Time 4    Period Weeks    Status New    Target Date 07/04/20      PT SHORT TERM GOAL #3   Title Patient ambulates 200' with cane & prosthesis with supervision.    Time 4    Period Weeks    Status New    Target Date 07/04/20      PT SHORT TERM GOAL #4   Title Patient negotiates ramps & curbs with prosthesis & cane with minA.    Time 4    Period Weeks    Status New    Target Date 07/04/20             PT Long Term Goals - 06/05/20 1235      PT LONG TERM GOAL #1   Title Patient tolerates prosthesis wear >90% of awake hours without skin or limb pain  issues and verbalizes proper prosthetic care.    Time 12    Period Weeks    Status New    Target Date 08/30/20      PT LONG TERM GOAL #2   Title Berg Balance >45/56    Time 12    Period Weeks    Status New    Target Date 08/30/20      PT LONG TERM GOAL #3   Title Dynamic Gait Index with cane or less & prosthesis >/= 14 / 24    Time 12    Period Weeks    Status New    Target Date 08/30/20       PT LONG TERM GOAL #4   Title Patient ambulates >500' with cane or less & prosthesis and negotiates ramps/curbs/stairs single rail modified independent.    Time 12    Period Weeks    Status New    Target Date 08/30/20      PT LONG TERM GOAL #5   Title Patient verbalizes & demonstrates understanding of how to properly use fitness equipment to return to gym per her goal.    Time 12    Period Weeks    Status New    Target Date 08/30/20                  Plan - 06/05/20 1226    Clinical Impression Statement This 70yo female was highly active including fitness / working out prior to left Transtibial Amputation on 07/31/2019. She received her prosthesis 01/16/2020 and choose to use HHPT.  She requires cues for safe prosthetic care & use.  She is wearing prosthesis most of awake hours with 2 times of day limiting wear which increases fall risk during those times.  Berg Balance score of 35/56 indicates high fall risk & dependency in standing ADLs. Her prosthetic gait requires RW support.  PT assessed with cane & prosthesis with gait deviations, gait velocity 1.57 ft/sec and Dynamic Gait Index 6/24 which indicates high fall risk.  Patient would benefit from skilled PT to improve function & safety.    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, Thoracotomy with lobectomy 02/24/2018,    Examination-Activity Limitations Locomotion Level;Lift;Squat;Stairs;Stand;Transfers    Examination-Participation Restrictions Community Activity    Stability/Clinical Decision Making Evolving/Moderate complexity    Clinical Decision Making Moderate    Rehab Potential Good    PT Frequency 2x / week    PT Duration 12 weeks    PT Treatment/Interventions ADLs/Self Care Home Management;DME Instruction;Gait training;Stair training;Functional mobility training;Therapeutic  activities;Therapeutic exercise;Balance training;Neuromuscular re-education;Patient/family education;Prosthetic Training    PT Next Visit Plan review prosthetic care,  HEP for balance & standing strength,  prosthetic gait with cane    Consulted and Agree with Plan of Care Patient           Patient will benefit from skilled therapeutic intervention in order to improve the following deficits and impairments:  Abnormal gait,Decreased activity tolerance,Decreased balance,Decreased endurance,Decreased knowledge of use of DME,Decreased mobility,Decreased range of motion,Increased edema,Postural dysfunction,Prosthetic Dependency  Visit Diagnosis: Unsteadiness on feet  Other abnormalities of gait and mobility  Muscle weakness (generalized)  Abnormal posture     Problem List Patient Active Problem List   Diagnosis Date Noted  . Uncontrolled type 2 diabetes mellitus with hyperglycemia, with long-term current use of insulin ([place]) 08/27/2017  . Hyperlipidemia associated with type 2  diabetes mellitus ([place]) 03/18/2017  . Chronic renal disease, stage V ([place]) 08/20/2015  . Personal history of noncompliance with medical treatment, presenting hazards to health 08/20/2015  . Proteinuria 07/04/2014  . Uncontrolled type 2 diabetes mellitus with ESRD (end-stage renal disease) ([place]) 05/21/2014  . Hyperlipidemia 05/21/2014  . HTN (hypertension) 05/21/2014    Jamey Reas, PT, DPT 06/05/2020, 12:47 PM  [facility] Physical Therapy [address] [place], [place], [address] Phone: [phone]   Fax:  [phone]  Name: Taytem Ghattas MRN: [id number] Date of Birth: 08-16-1950

## 2020-06-12 ENCOUNTER — Encounter: Payer: Self-pay | Admitting: Physical Therapy

## 2020-06-12 ENCOUNTER — Ambulatory Visit (INDEPENDENT_AMBULATORY_CARE_PROVIDER_SITE_OTHER): Payer: Medicare Other | Admitting: Physical Therapy

## 2020-06-12 ENCOUNTER — Other Ambulatory Visit: Payer: Self-pay

## 2020-06-12 DIAGNOSIS — R293 Abnormal posture: Secondary | ICD-10-CM | POA: Diagnosis not present

## 2020-06-12 DIAGNOSIS — R2681 Unsteadiness on feet: Secondary | ICD-10-CM

## 2020-06-12 DIAGNOSIS — R2689 Other abnormalities of gait and mobility: Secondary | ICD-10-CM

## 2020-06-12 DIAGNOSIS — M6281 Muscle weakness (generalized): Secondary | ICD-10-CM | POA: Diagnosis not present

## 2020-06-12 NOTE — Patient Instructions (Addendum)
Sweating increases with an amputation. Your body is trying to regulate your temperature & without an extremity, you sweat more easily to cool off. Also prosthetic material like liners do not breath and add hot layers which causes even more sweating. With time your body typically will accommodate to prosthesis and your sweat level will come closer to level with amputation but not pre-amputation level.   You need to pat your limb & liner dry when you notice sweating. If you leave sweat trapped inside your liner, then it can result in a blister.   Signs of sweating in your liner: 1. You are sweating elsewhere on your body or you notice sweat running / dripping.  2. Take note of how high your liner comes up on your limb when you first put your liner on your limb. If you notice that your liner has slipped down, then you probably have sweat inside your liner. A good time to check for liner slippage is when toileting.  3. You feel air bubbles inside your liner. When you liner slips, then air is allowed in bottom. As you put weight on prosthesis, the air is burp or pushed out. 4. You feel something crawling or moving inside your liner. When sweat runs inside the closed system of liner, it often feels like a bug or something crawling inside your liner.  If any of above symptoms are noted, you need to remove your prosthesis & liner to pat your limb & liner dry. This is permanent need as leaving sweat or water trapped can result in a blister or wound.     Access Code: [email or web address] URL: [email or web address] Date: 06/12/2020 Prepared by: Jamey Reas  Exercises Seated Hamstring Stretch - 2 x daily - 7 x weekly - 1 sets - 3 reps - 30 seconds hold Seated Gastroc Stretch with Strap - 2 x daily - 7 x weekly - 1 sets - 3 reps - 30 seconds hold Seated Hamstring Stretch with Strap - 2 x daily - 7 x weekly - 1 sets - 3 reps - 30 seconds hold feet together eyes open on floor - 1 x daily - 4 x weekly - 1  sets - 10 reps - 2 seconds hold Feet Apart with Eyes Closed with Head Motions - 1 x daily - 4 x weekly - 1 sets - 10 reps - 2 seconds hold Wide stance on Foam Pad head movements - 1 x daily - 4 x weekly - 1 sets - 10 reps - 2 seconds hold Alternating Punch with Resistance - 1 x daily - 5 x weekly - 1 sets - 10 reps - 5 seconds hold Standing Scapular Protraction with Resistance - 1 x daily - 7 x weekly - 1 sets - 10 reps - 5 seconds hold Standing alternate rows with resistance - 1 x daily - 7 x weekly - 1 sets - 10 reps - 5 seconds hold Standing Row with Anchored Resistance - 1 x daily - 7 x weekly - 1 sets - 10 reps - 5 seconds hold Alternating elbow flexion with resistance - 1 x daily - 7 x weekly - 1 sets - 10 reps - 5 seconds hold Standing Bicep Curls with Resistance - 1 x daily - 7 x weekly - 1 sets - 10 reps - 5 seconds hold

## 2020-06-12 NOTE — Therapy (Signed)
[facility] Therapy [address] [place], [place], [address] Phone: [phone]   Fax:  [phone]  Physical Therapy Treatment  Patient Details  Name: Shelley [name] MRN: [id number] Date of Birth: Sep 15, 1950 Referring Provider (PT): Sallee Lange, MD   Encounter Date: 06/12/2020   PT End of Session - 06/12/20 0757    Visit Number 3    Number of Visits 25    Date for PT Re-Evaluation 09/03/20    Authorization Type Medicare & BCBS supp    PT Start Time 0800    PT Stop Time 0849    PT Time Calculation (min) 49 min    Equipment Utilized During Treatment Gait belt    Activity Tolerance Patient tolerated treatment well    Behavior During Therapy [facility] for tasks assessed/performed           Past Medical History:  Diagnosis Date  . Diabetes ([place])   . Diabetes mellitus without complication ([place])   . End stage renal disease ([place])   . HTN (hypertension)   . Hyperlipidemia   . Hypertension     Past Surgical History:  Procedure Laterality Date  . ABDOMINAL HYSTERECTOMY     fibroids/complete hysterec 2000  . APPENDECTOMY    . CATARACT EXTRACTION Bilateral   . COLONOSCOPY N/A 03/12/2016   Procedure: COLONOSCOPY;  Surgeon: Daneil Dolin, MD;  [facility];  Service: Endoscopy;  Laterality: N/A;  9:30 am  . POLYPECTOMY  03/12/2016   Procedure: POLYPECTOMY;  Surgeon: Daneil Dolin, MD;  Location[facility];  Service: Endoscopy;;  colon  . YAG LASER APPLICATION Left 48/04/7076   Procedure: YAG LASER APPLICATION;  Surgeon: Williams Che, MD;  Location: [facility];  Service: Ophthalmology;  Laterality: Left;    There were no vitals filed for this visit.   Subjective Assessment - 06/12/20 0758    Subjective She stopped antibiodic. She started donning prosthesis upon arising and wears ~8hrs. She usually arises ~8:00-8:30 and gets in bed ~9:00.    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 No/denies                             [facility] Adult PT Treatment/Exercise - 06/12/20 0800      Neuro Re-ed    Neuro Re-ed Details  see HEP / pt instructions      Prosthetics   Prosthetic Care Comments  PT recommended donning upon arising, removing to shower, apply antiperspirant after shower & redonne, wear until bedtime. Dry limb & liner q4 hrs.On days not feeling well, still wear prosthesis when out of bed and decrease activities.  PT instructed in signs of sweating & need to dry limb/liner    Current prosthetic wear tolerance (days/week)  daily    Current prosthetic wear tolerance (#hours/day)  ~8 hrs. PT recommended to increase to all awake hours.    Edema pitting with 5 sec capillary refill    Residual limb condition  Sweaty with gray overmoist skin, no open areas, cylinderical shape, normal temperature,    Education Provided Residual limb care;Proper Donning;Proper wear schedule/adjustment;Other (comment)   see prosthetic care comments   Person(s) Educated Patient    Education Method Explanation;Demonstration;Tactile cues;Verbal cues;Handout    Education Method Verbalized understanding;Returned  demonstration;Tactile cues required;Verbal cues required;Needs further instruction                  PT Education - 06/12/20 0831    Education Details Medbridge Access Code: [email or web address]    Person(s) Educated Patient    Methods Explanation;Demonstration;Tactile cues;Verbal cues;Handout    Comprehension Verbalized understanding;Returned demonstration;Verbal cues required;Tactile cues required            PT Short Term Goals - 06/05/20 1245      PT SHORT TERM GOAL #1   Title Patient demonstrates how to properly donne prosthesis and verbalizes signs of sweating inside liner.    Time 4    Period Weeks    Status New     Target Date 07/04/20      PT SHORT TERM GOAL #2   Title Patient able to pick up items from floor without UE support safely.    Time 4    Period Weeks    Status New    Target Date 07/04/20      PT SHORT TERM GOAL #3   Title Patient ambulates 200' with cane & prosthesis with supervision.    Time 4    Period Weeks    Status New    Target Date 07/04/20      PT SHORT TERM GOAL #4   Title Patient negotiates ramps & curbs with prosthesis & cane with minA.    Time 4    Period Weeks    Status New    Target Date 07/04/20             PT Long Term Goals - 06/05/20 1235      PT LONG TERM GOAL #1   Title Patient tolerates prosthesis wear >90% of awake hours without skin or limb pain issues and verbalizes proper prosthetic care.    Time 12    Period Weeks    Status New    Target Date 08/30/20      PT LONG TERM GOAL #2   Title Berg Balance >45/56    Time 12    Period Weeks    Status New    Target Date 08/30/20      PT LONG TERM GOAL #3   Title Dynamic Gait Index with cane or less & prosthesis >/= 14 / 24    Time 12    Period Weeks    Status New    Target Date 08/30/20      PT LONG TERM GOAL #4   Title Patient ambulates >500' with cane or less & prosthesis and negotiates ramps/curbs/stairs single rail modified independent.    Time 12    Period Weeks    Status New    Target Date 08/30/20      PT LONG TERM GOAL #5   Title Patient verbalizes & demonstrates understanding of how to properly use fitness equipment to return to gym per her goal.    Time 12    Period Weeks    Status New    Target Date 08/30/20                 Plan - 06/12/20 0757    Clinical Impression Statement PT initiated HEP for LE stretches & standing balance. she appears to understand.  PT also progressed prosthetic care instructions and she has a basic understanding.    Personal Factors and Comorbidities Comorbidity 3+;Fitness;Time since onset of injury/illness/exacerbation     Comorbidities CKD st 5, DM2, ESRD, HTN, renal CA,  kidney transplant 08/30/2017, Thoracotomy with lobectomy 02/24/2018,    Examination-Activity Limitations Locomotion Level;Lift;Squat;Stairs;Stand;Transfers    Examination-Participation Restrictions Community Activity    Stability/Clinical Decision Making Evolving/Moderate complexity    Rehab Potential Good    PT Frequency 2x / week    PT Duration 12 weeks    PT Treatment/Interventions ADLs/Self Care Home Management;DME Instruction;Gait training;Stair training;Functional mobility training;Therapeutic activities;Therapeutic exercise;Balance training;Neuromuscular re-education;Patient/family education;Prosthetic Training    PT Next Visit Plan review prosthetic care,  check & updateHEP for balance & standing strength,  prosthetic gait with cane    Consulted and Agree with Plan of Care Patient           Patient will benefit from skilled therapeutic intervention in order to improve the following deficits and impairments:  Abnormal gait,Decreased activity tolerance,Decreased balance,Decreased endurance,Decreased knowledge of use of DME,Decreased mobility,Decreased range of motion,Increased edema,Postural dysfunction,Prosthetic Dependency  Visit Diagnosis: Unsteadiness on feet  Other abnormalities of gait and mobility  Muscle weakness (generalized)  Abnormal posture     Problem List Patient Active Problem List   Diagnosis Date Noted  . Uncontrolled type 2 diabetes mellitus with hyperglycemia, with long-term current use of insulin ([place]) 08/27/2017  . Hyperlipidemia associated with type 2 diabetes mellitus ([place]) 03/18/2017  . Chronic renal disease, stage V ([place]) 08/20/2015  . Personal history of noncompliance with medical treatment, presenting hazards to health 08/20/2015  . Proteinuria 07/04/2014  . Uncontrolled type 2 diabetes mellitus with ESRD (end-stage renal disease) ([place]) 05/21/2014  . Hyperlipidemia 05/21/2014  . HTN (hypertension)  05/21/2014    Jamey Reas, PT, DPT 06/12/2020, 8:58 AM  [facility] [address] [place], [place], [address] Phone: [phone]   Fax:  [phone]  Name: Shelley [name] MRN: [id number] Date of Birth: [birth year]

## 2020-06-19 ENCOUNTER — Encounter: Payer: Self-pay | Admitting: Physical Therapy

## 2020-06-19 ENCOUNTER — Ambulatory Visit (INDEPENDENT_AMBULATORY_CARE_PROVIDER_SITE_OTHER): Payer: Medicare Other | Admitting: Physical Therapy

## 2020-06-19 ENCOUNTER — Other Ambulatory Visit: Payer: Self-pay

## 2020-06-19 ENCOUNTER — Encounter: Payer: Medicare Other | Admitting: Physical Therapy

## 2020-06-19 DIAGNOSIS — M6281 Muscle weakness (generalized): Secondary | ICD-10-CM | POA: Diagnosis not present

## 2020-06-19 DIAGNOSIS — R2689 Other abnormalities of gait and mobility: Secondary | ICD-10-CM | POA: Diagnosis not present

## 2020-06-19 DIAGNOSIS — R293 Abnormal posture: Secondary | ICD-10-CM

## 2020-06-19 DIAGNOSIS — R2681 Unsteadiness on feet: Secondary | ICD-10-CM

## 2020-06-19 NOTE — Therapy (Signed)
[facility] [address] [place], [place], [address] Phone: [phone]   Fax:  [phone]  Physical Therapy Treatment  Patient Details  Name: Shelley [name] MRN: [id number] Date of Birth: 28-Feb-1951 Referring Provider (PT): Sallee Lange, MD   Encounter Date: 06/19/2020   PT End of Session - 06/19/20 0815    Visit Number 3    Number of Visits 25    Date for PT Re-Evaluation 09/03/20    Authorization Type Medicare & BCBS supp    PT Start Time 0804    PT Stop Time 0845    PT Time Calculation (min) 41 min    Equipment Utilized During Treatment Gait belt    Activity Tolerance Patient tolerated treatment well    Behavior During Therapy [facility] for tasks assessed/performed           Past Medical History:  Diagnosis Date  . Diabetes ([place])   . Diabetes mellitus without complication ([place])   . End stage renal disease ([place])   . HTN (hypertension)   . Hyperlipidemia   . Hypertension     Past Surgical History:  Procedure Laterality Date  . ABDOMINAL HYSTERECTOMY     fibroids/complete hysterec 2000  . APPENDECTOMY    . CATARACT EXTRACTION Bilateral   . COLONOSCOPY N/A 03/12/2016   Procedure: COLONOSCOPY;  Surgeon: Daneil Dolin, MD;  [facility];  Service: Endoscopy;  Laterality: N/A;  9:30 am  . POLYPECTOMY  03/12/2016   Procedure: POLYPECTOMY;  Surgeon: Daneil Dolin, MD;  [facility];  Service: Endoscopy;;  colon  . YAG LASER APPLICATION Left 46/07/6810   Procedure: YAG LASER APPLICATION;  Surgeon: Williams Che, MD;  Location: [facility];  Service: Ophthalmology;  Laterality: Left;    There were no vitals filed for this visit.   Subjective Assessment - 06/19/20 0816    Subjective She has been doing her exercises.    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 No/denies                             [facility] Adult PT Treatment/Exercise - 06/19/20 0804      Ambulation/Gait   Ambulation/Gait Yes    Ambulation/Gait Assistance 5: Supervision;4: Min guard   supervision/cues RW & min guard cane   Ambulation/Gait Assistance Details PT demo, visual & verbal cues on step width / not abducting (used line for visual progressed to proprioception) and wt shift over prosthesis in stance with heel rise on RLE.  PT also demo & verbal cues on difference in std tip vs stand alone tip.    Ambulation Distance (Feet) 300 Feet   300' RW & 300' cane stand alone tip   Assistive device Prosthesis;Straight cane;Rolling walker    Ambulation Surface Level;Indoor      Prosthetics   Prosthetic Care Comments  PT educated on Sweat Block weekly & antispirant daily to control sweat. began education on adjusting ply socks but need pt to bring in her extra socks to fully educate.    Current prosthetic wear tolerance (days/week)  daily    Current prosthetic wear tolerance (#hours/day)  ~8 hrs. PT recommended to increase to all awake hours.    Current prosthetic weight-bearing tolerance (hours/day)  Pt  tolerated standing for 8 min without limb pain.    Edema pitting with 5 sec capillary refill    Residual limb condition  Sweaty with gray overmoist skin, no open areas, cylinderical shape, normal temperature,    Education Provided Residual limb care;Proper Donning;Proper wear schedule/adjustment;Other (comment)    Person(s) Educated Patient    Education Method Explanation;Demonstration;Tactile cues;Verbal cues    Education Method Verbalized understanding;Tactile cues required;Verbal cues required;Needs further instruction                    PT Short Term Goals - 06/05/20 1245      PT SHORT TERM GOAL #1   Title Patient demonstrates how to properly donne prosthesis and verbalizes signs of sweating inside  liner.    Time 4    Period Weeks    Status New    Target Date 07/04/20      PT SHORT TERM GOAL #2   Title Patient able to pick up items from floor without UE support safely.    Time 4    Period Weeks    Status New    Target Date 07/04/20      PT SHORT TERM GOAL #3   Title Patient ambulates 200' with cane & prosthesis with supervision.    Time 4    Period Weeks    Status New    Target Date 07/04/20      PT SHORT TERM GOAL #4   Title Patient negotiates ramps & curbs with prosthesis & cane with minA.    Time 4    Period Weeks    Status New    Target Date 07/04/20             PT Long Term Goals - 06/05/20 1235      PT LONG TERM GOAL #1   Title Patient tolerates prosthesis wear >90% of awake hours without skin or limb pain issues and verbalizes proper prosthetic care.    Time 12    Period Weeks    Status New    Target Date 08/30/20      PT LONG TERM GOAL #2   Title Berg Balance >45/56    Time 12    Period Weeks    Status New    Target Date 08/30/20      PT LONG TERM GOAL #3   Title Dynamic Gait Index with cane or less & prosthesis >/= 14 / 24    Time 12    Period Weeks    Status New    Target Date 08/30/20      PT LONG TERM GOAL #4   Title Patient ambulates >500' with cane or less & prosthesis and negotiates ramps/curbs/stairs single rail modified independent.    Time 12    Period Weeks    Status New    Target Date 08/30/20      PT LONG TERM GOAL #5   Title Patient verbalizes & demonstrates understanding of how to properly use fitness equipment to return to gym per her goal.    Time 12    Period Weeks    Status New    Target Date 08/30/20                 Plan - 06/19/20 0816    Clinical Impression Statement PT instructed pt in proper step width & wt shift over prosthesis in stance with both RW & cane.  Her balance with gait seemed to improve and she seems  to understand.  PT continues to address prosthetic care issues.    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, Thoracotomy with lobectomy 02/24/2018,    Examination-Activity Limitations Locomotion Level;Lift;Squat;Stairs;Stand;Transfers    Examination-Participation Restrictions Community Activity    Stability/Clinical Decision Making Evolving/Moderate complexity    Rehab Potential Good    PT Frequency 2x / week    PT Duration 12 weeks    PT Treatment/Interventions ADLs/Self Care Home Management;DME Instruction;Gait training;Stair training;Functional mobility training;Therapeutic activities;Therapeutic exercise;Balance training;Neuromuscular re-education;Patient/family education;Prosthetic Training    PT Next Visit Plan review prosthetic care, introduce how to use fitness center equipment with prosthesis  check & update HEP for balance & standing strength,  prosthetic gait with cane    Consulted and Agree with Plan of Care Patient           Patient will benefit from skilled therapeutic intervention in order to improve the following deficits and impairments:  Abnormal gait,Decreased activity tolerance,Decreased balance,Decreased endurance,Decreased knowledge of use of DME,Decreased mobility,Decreased range of motion,Increased edema,Postural dysfunction,Prosthetic Dependency  Visit Diagnosis: Unsteadiness on feet  Other abnormalities of gait and mobility  Muscle weakness (generalized)  Abnormal posture     Problem List Patient Active Problem List   Diagnosis Date Noted  . Uncontrolled type 2 diabetes mellitus with hyperglycemia, with long-term current use of insulin ([place]) 08/27/2017  . Hyperlipidemia associated with type 2 diabetes mellitus ([place]) 03/18/2017  . Chronic renal disease, stage V ([place]) 08/20/2015  . Personal history of noncompliance with medical treatment, presenting hazards to health 08/20/2015  . Proteinuria  07/04/2014  . Uncontrolled type 2 diabetes mellitus with ESRD (end-stage renal disease) ([place]) 05/21/2014  . Hyperlipidemia 05/21/2014  . HTN (hypertension) 05/21/2014    Jamey Reas, PT, DPT 06/19/2020, 9:21 AM  [facility] Physical Therapy [address] [place], [place], [address] Phone: [phone]   Fax:  [phone]  Name: Shelley [name] MRN: [id number] Date of Birth: 1950-05-14

## 2020-06-26 ENCOUNTER — Encounter: Payer: Self-pay | Admitting: Physical Therapy

## 2020-06-26 ENCOUNTER — Ambulatory Visit (INDEPENDENT_AMBULATORY_CARE_PROVIDER_SITE_OTHER): Payer: Medicare Other | Admitting: Physical Therapy

## 2020-06-26 ENCOUNTER — Other Ambulatory Visit: Payer: Self-pay

## 2020-06-26 DIAGNOSIS — R2681 Unsteadiness on feet: Secondary | ICD-10-CM | POA: Diagnosis not present

## 2020-06-26 DIAGNOSIS — R2689 Other abnormalities of gait and mobility: Secondary | ICD-10-CM | POA: Diagnosis not present

## 2020-06-26 DIAGNOSIS — M6281 Muscle weakness (generalized): Secondary | ICD-10-CM | POA: Diagnosis not present

## 2020-06-26 DIAGNOSIS — R293 Abnormal posture: Secondary | ICD-10-CM | POA: Diagnosis not present

## 2020-06-26 NOTE — Therapy (Signed)
[facility] Physical Therapy [address] [place], [place], [address] Phone: [phone]   Fax:  [phone]  Physical Therapy Treatment  Patient Details  Name: Shelley [name] MRN: [id number] Date of Birth: 23-Sep-1950 Referring Provider (PT): Sallee Lange, MD   Encounter Date: 06/26/2020   PT End of Session - 06/26/20 0759    Visit Number 4    Number of Visits 25    Date for PT Re-Evaluation 09/03/20    Authorization Type Medicare & BCBS supp    PT Start Time 0800    PT Stop Time 0845    PT Time Calculation (min) 45 min    Equipment Utilized During Treatment Gait belt    Activity Tolerance Patient tolerated treatment well    Behavior During Therapy [facility] for tasks assessed/performed           Past Medical History:  Diagnosis Date  . Diabetes ([place])   . Diabetes mellitus without complication ([place])   . End stage renal disease ([place])   . HTN (hypertension)   . Hyperlipidemia   . Hypertension     Past Surgical History:  Procedure Laterality Date  . ABDOMINAL HYSTERECTOMY     fibroids/complete hysterec 2000  . APPENDECTOMY    . CATARACT EXTRACTION Bilateral   . COLONOSCOPY N/A 03/12/2016   Procedure: COLONOSCOPY;  Surgeon: Daneil Dolin, MD;  [facility];  Service: Endoscopy;  Laterality: N/A;  9:30 am  . POLYPECTOMY  03/12/2016   Procedure: POLYPECTOMY;  Surgeon: Daneil Dolin, MD;  [facility];  Service: Endoscopy;;  colon  . YAG LASER APPLICATION Left 96/04/2295   Procedure: YAG LASER APPLICATION;  Surgeon: [place] Che, MD;  Location: [facility];  Service: Ophthalmology;  Laterality: Left;    There were no vitals filed for this visit.   Subjective Assessment - 06/26/20 0800    Subjective She has been doing her exercises including walking without issues.  She is wearing prosthesis all awake hours paying attention to ply socks & sweat as PT instructed.    Pertinent History CKD st 5, DM2, ESRD, HTN, renal CA, kidney transplant  08/30/2017, Thoracotomy with lobectomy 02/24/2018,    Patient Stated Goals to be active like previously including working out (treadmill, weight training)    Currently in Pain? No/denies                             [facility] Adult PT Treatment/Exercise - 06/26/20 0800      Ambulation/Gait   Ambulation/Gait Yes    Ambulation/Gait Assistance 5: Supervision;4: Min guard   supervision/cues RW & min guard cane   Ambulation/Gait Assistance Details worked on proper step widith (PT placed colored band as target on RW) not rotating sound heel to midline and upright posture    Ambulation Distance (Feet) 300 Feet   300' RW & 300' cane stand alone tip   Assistive device Prosthesis;Straight cane;Rolling walker    Ambulation Surface Level;Indoor      High Level Balance   High Level Balance Activities Side stepping;Backward walking;Turns   turning 90* & 180*   High Level Balance Comments initially //bars progressed to cane inside bars.  PT demo & verbal cues on technique with TTA prosthesis      Exercises   Exercises Knee/Hip      Knee/Hip Exercises: Aerobic   Recumbent Bike Seat 5 level 1 for 5 min PT instructed in use & setup with TTA  prosthesis and pt verbalized understanding.      Prosthetics   Current prosthetic wear tolerance (days/week)  daily    Current prosthetic wear tolerance (#hours/day)  Pt reports most of her awake hours.    Current prosthetic weight-bearing tolerance (hours/day)  Pt tolerated standing for 8 min without limb pain.    Edema pitting with 5 sec capillary refill    Residual limb condition  pt reports no issues.                    PT Short Term Goals - 06/05/20 1245      PT SHORT TERM GOAL #1   Title Patient demonstrates how to properly donne prosthesis and verbalizes signs of sweating inside liner.    Time 4    Period Weeks    Status New    Target Date 07/04/20      PT SHORT TERM GOAL #2   Title Patient able to pick up items from floor  without UE support safely.    Time 4    Period Weeks    Status New    Target Date 07/04/20      PT SHORT TERM GOAL #3   Title Patient ambulates 200' with cane & prosthesis with supervision.    Time 4    Period Weeks    Status New    Target Date 07/04/20      PT SHORT TERM GOAL #4   Title Patient negotiates ramps & curbs with prosthesis & cane with minA.    Time 4    Period Weeks    Status New    Target Date 07/04/20             PT Long Term Goals - 06/05/20 1235      PT LONG TERM GOAL #1   Title Patient tolerates prosthesis wear >90% of awake hours without skin or limb pain issues and verbalizes proper prosthetic care.    Time 12    Period Weeks    Status New    Target Date 08/30/20      PT LONG TERM GOAL #2   Title Berg Balance >45/56    Time 12    Period Weeks    Status New    Target Date 08/30/20      PT LONG TERM GOAL #3   Title Dynamic Gait Index with cane or less & prosthesis >/= 14 / 24    Time 12    Period Weeks    Status New    Target Date 08/30/20      PT LONG TERM GOAL #4   Title Patient ambulates >500' with cane or less & prosthesis and negotiates ramps/curbs/stairs single rail modified independent.    Time 12    Period Weeks    Status New    Target Date 08/30/20      PT LONG TERM GOAL #5   Title Patient verbalizes & demonstrates understanding of how to properly use fitness equipment to return to gym per her goal.    Time 12    Period Weeks    Status New    Target Date 08/30/20                 Plan - 06/26/20 0759    Clinical Impression Statement PT instructed in balance & movement sideways, backwards & changing directions which she improved with instruction but needs additional work.    Personal Factors and Comorbidities Comorbidity 3+;Fitness;Time since onset  of injury/illness/exacerbation    Comorbidities CKD st 5, DM2, ESRD, HTN, renal CA, kidney transplant 08/30/2017, Thoracotomy with lobectomy 02/24/2018,     Examination-Activity Limitations Locomotion Level;Lift;Squat;Stairs;Stand;Transfers    Examination-Participation Restrictions Community Activity    Stability/Clinical Decision Making Evolving/Moderate complexity    Rehab Potential Good    PT Frequency 2x / week    PT Duration 12 weeks    PT Treatment/Interventions ADLs/Self Care Home Management;DME Instruction;Gait training;Stair training;Functional mobility training;Therapeutic activities;Therapeutic exercise;Balance training;Neuromuscular re-education;Patient/family education;Prosthetic Training    PT Next Visit Plan check STGs, review prosthetic care, introduce how to use fitness center equipment with prosthesis  check & update HEP for balance & standing strength,  prosthetic gait with cane    Consulted and Agree with Plan of Care Patient           Patient will benefit from skilled therapeutic intervention in order to improve the following deficits and impairments:  Abnormal gait,Decreased activity tolerance,Decreased balance,Decreased endurance,Decreased knowledge of use of DME,Decreased mobility,Decreased range of motion,Increased edema,Postural dysfunction,Prosthetic Dependency  Visit Diagnosis: Unsteadiness on feet  Other abnormalities of gait and mobility  Muscle weakness (generalized)  Abnormal posture     Problem List Patient Active Problem List   Diagnosis Date Noted  . Uncontrolled type 2 diabetes mellitus with hyperglycemia, with long-term current use of insulin ([place]) 08/27/2017  . Hyperlipidemia associated with type 2 diabetes mellitus ([place]) 03/18/2017  . Chronic renal disease, stage V ([place]) 08/20/2015  . Personal history of noncompliance with medical treatment, presenting hazards to health 08/20/2015  . Proteinuria 07/04/2014  . Uncontrolled type 2 diabetes mellitus with ESRD (end-stage renal disease) ([place]) 05/21/2014  . Hyperlipidemia 05/21/2014  . HTN (hypertension) 05/21/2014    Jamey Reas, PT,  DPT 06/26/2020, 2:07 PM  [facility] Physical Therapy [address] [place], [place], [address] Phone: [phone]   Fax:  [phone]  Name: Makylah Bossard MRN: [id number] Date of Birth: 10-22-1950

## 2020-06-27 ENCOUNTER — Encounter: Payer: Medicare Other | Admitting: Physical Therapy

## 2020-07-03 ENCOUNTER — Ambulatory Visit (INDEPENDENT_AMBULATORY_CARE_PROVIDER_SITE_OTHER): Payer: Medicare Other | Admitting: Physical Therapy

## 2020-07-03 ENCOUNTER — Other Ambulatory Visit: Payer: Self-pay

## 2020-07-03 ENCOUNTER — Encounter: Payer: Self-pay | Admitting: Physical Therapy

## 2020-07-03 DIAGNOSIS — M6281 Muscle weakness (generalized): Secondary | ICD-10-CM | POA: Diagnosis not present

## 2020-07-03 DIAGNOSIS — R2681 Unsteadiness on feet: Secondary | ICD-10-CM

## 2020-07-03 DIAGNOSIS — R293 Abnormal posture: Secondary | ICD-10-CM

## 2020-07-03 DIAGNOSIS — R2689 Other abnormalities of gait and mobility: Secondary | ICD-10-CM | POA: Diagnosis not present

## 2020-07-03 NOTE — Therapy (Signed)
[facility] Physical Therapy [address] [place], [place], [address] Phone: [phone]   Fax:  [phone]  Physical Therapy Treatment  Patient Details  Name: Shelley [name] MRN: [id number] Date of Birth: 1950/04/08 Referring Provider (PT): Sallee Lange, MD   Encounter Date: 07/03/2020   PT End of Session - 07/03/20 0810    Visit Number 5    Number of Visits 25    Date for PT Re-Evaluation 09/03/20    Authorization Type Medicare & BCBS supp    PT Start Time 0802    PT Stop Time 0845    PT Time Calculation (min) 43 min    Equipment Utilized During Treatment Gait belt    Activity Tolerance Patient tolerated treatment well    Behavior During Therapy [facility] for tasks assessed/performed           Past Medical History:  Diagnosis Date  . Diabetes ([place])   . Diabetes mellitus without complication ([place])   . End stage renal disease ([place])   . HTN (hypertension)   . Hyperlipidemia   . Hypertension     Past Surgical History:  Procedure Laterality Date  . ABDOMINAL HYSTERECTOMY     fibroids/complete hysterec 2000  . APPENDECTOMY    . CATARACT EXTRACTION Bilateral   . COLONOSCOPY N/A 03/12/2016   Procedure: COLONOSCOPY;  Surgeon: Daneil Dolin, MD;  [facility];  Service: Endoscopy;  Laterality: N/A;  9:30 am  . POLYPECTOMY  03/12/2016   Procedure: POLYPECTOMY;  Surgeon: Daneil Dolin, MD;  [facility];  Service: Endoscopy;;  colon  . YAG LASER APPLICATION Left 66/06/4032   Procedure: YAG LASER APPLICATION;  Surgeon: [place] Che, MD;  Location: [facility];  Service: Ophthalmology;  Laterality: Left;    There were no vitals filed for this visit.   Subjective Assessment - 07/03/20 0802    Subjective She has a wound on great toe of right foot. She is having vascular studies.  She was able to drive her stick shift car using prosthesis on clutch.    Pertinent History CKD st 5, DM2, ESRD, HTN, renal CA, kidney transplant 08/30/2017,  Thoracotomy with lobectomy 02/24/2018,    Patient Stated Goals to be active like previously including working out (treadmill, weight training)    Currently in Pain? No/denies                             [facility] Adult PT Treatment/Exercise - 07/03/20 0802      Transfers   Transfers Sit to Stand;Stand to Sit    Sit to Stand 5: Supervision;With upper extremity assist;From chair/3-in-1    Sit to Stand Details Visual cues for safe use of DME/AE;Verbal cues for technique    Sit to Stand Details (indicate cue type and reason) worked on standing from chairs without armrests using UEs & standing from chair with wheels.    Stand to Sit 5: Supervision;With upper extremity assist;To chair/3-in-1    Stand to Sit Details (indicate cue type and reason) Visual cues for safe use of DME/AE;Verbal cues for technique    Stand to Sit Details worked on sitting to chairs without armrests using UEs & sitting to chair with wheels.      Ambulation/Gait   Ambulation/Gait Yes    Ambulation/Gait Assistance 5: Supervision;4: Min guard   supervision/cues RW & min guard cane   Ambulation Distance (Feet) 300 Feet   300' RW & 100' cane stand  alone tip   Assistive device Prosthesis;Straight cane;Rolling walker    Ambulation Surface Level;Indoor    Ramp 3: Mod assist   cane & TTA prosthesis   Ramp Details (indicate cue type and reason) tactile & verbal cues on technique    Curb 3: Mod assist   cane & TTA prosthesis   Curb Details (indicate cue type and reason) tactile & verbal cues on technique      High Level Balance   High Level Balance Activities --    High Level Balance Comments --      Self-Care   Self-Care Lifting    Lifting PT demo & verbal cues on lifting light & 8# box from floor with TTA prosthesis.  Pt return demo lifting with minA for balance & verbal cues for carryover.      Exercises   Exercises Knee/Hip      Knee/Hip Exercises: Aerobic   Recumbent Bike --      Prosthetics    Prosthetic Care Comments  PT discussed functional ability with bil. prostheses if needed to decrease loss of hope if she does require RLE amputation.  PT also recommended continued wear of left prosthesis for safety.  PT instucted in current prosthesis wear when laying in her bed to watch tv.    Current prosthetic wear tolerance (days/week)  daily    Current prosthetic wear tolerance (#hours/day)  Pt reports most of her awake hours.    Current prosthetic weight-bearing tolerance (hours/day)  Pt tolerated standing for 8 min without limb pain.    Edema pitting with 5 sec capillary refill    Residual limb condition  pt reports no issues.    Education Provided Other (comment);Proper wear schedule/adjustment   see prosthetic care comments.   Person(s) Educated Patient    Education Method Explanation;Verbal cues    Education Method Verbalized understanding;Verbal cues required;Needs further instruction                    PT Short Term Goals - 06/05/20 1245      PT SHORT TERM GOAL #1   Title Patient demonstrates how to properly donne prosthesis and verbalizes signs of sweating inside liner.    Time 4    Period Weeks    Status New    Target Date 07/04/20      PT SHORT TERM GOAL #2   Title Patient able to pick up items from floor without UE support safely.    Time 4    Period Weeks    Status New    Target Date 07/04/20      PT SHORT TERM GOAL #3   Title Patient ambulates 200' with cane & prosthesis with supervision.    Time 4    Period Weeks    Status New    Target Date 07/04/20      PT SHORT TERM GOAL #4   Title Patient negotiates ramps & curbs with prosthesis & cane with minA.    Time 4    Period Weeks    Status New    Target Date 07/04/20             PT Long Term Goals - 06/05/20 1235      PT LONG TERM GOAL #1   Title Patient tolerates prosthesis wear >90% of awake hours without skin or limb pain issues and verbalizes proper prosthetic care.    Time 12     Period Weeks    Status New  Target Date 08/30/20      PT LONG TERM GOAL #2   Title Berg Balance >45/56    Time 12    Period Weeks    Status New    Target Date 08/30/20      PT LONG TERM GOAL #3   Title Dynamic Gait Index with cane or less & prosthesis >/= 14 / 24    Time 12    Period Weeks    Status New    Target Date 08/30/20      PT LONG TERM GOAL #4   Title Patient ambulates >500' with cane or less & prosthesis and negotiates ramps/curbs/stairs single rail modified independent.    Time 12    Period Weeks    Status New    Target Date 08/30/20      PT LONG TERM GOAL #5   Title Patient verbalizes & demonstrates understanding of how to properly use fitness equipment to return to gym per her goal.    Time 12    Period Weeks    Status New    Target Date 08/30/20                 Plan - 07/03/20 0811    Clinical Impression Statement PT instructed in lifting items from floor which she improved.  PT also worked on sit to/from stand without armrests.    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, Thoracotomy with lobectomy 02/24/2018,    Examination-Activity Limitations Locomotion Level;Lift;Squat;Stairs;Stand;Transfers    Examination-Participation Restrictions Community Activity    Stability/Clinical Decision Making Evolving/Moderate complexity    Rehab Potential Good    PT Frequency 2x / week    PT Duration 12 weeks    PT Treatment/Interventions ADLs/Self Care Home Management;DME Instruction;Gait training;Stair training;Functional mobility training;Therapeutic activities;Therapeutic exercise;Balance training;Neuromuscular re-education;Patient/family education;Prosthetic Training    PT Next Visit Plan check STGs, review prosthetic care, introduce how to use fitness center equipment with prosthesis  check & update HEP for balance  & standing strength,  prosthetic gait with cane    Consulted and Agree with Plan of Care Patient           Patient will benefit from skilled therapeutic intervention in order to improve the following deficits and impairments:  Abnormal gait,Decreased activity tolerance,Decreased balance,Decreased endurance,Decreased knowledge of use of DME,Decreased mobility,Decreased range of motion,Increased edema,Postural dysfunction,Prosthetic Dependency  Visit Diagnosis: Unsteadiness on feet  Other abnormalities of gait and mobility  Muscle weakness (generalized)  Abnormal posture     Problem List Patient Active Problem List   Diagnosis Date Noted  . Uncontrolled type 2 diabetes mellitus with hyperglycemia, with long-term current use of insulin ([place]) 08/27/2017  . Hyperlipidemia associated with type 2 diabetes mellitus ([place]) 03/18/2017  . Chronic renal disease, stage V ([place]) 08/20/2015  . Personal history of noncompliance with medical treatment, presenting hazards to health 08/20/2015  . Proteinuria 07/04/2014  . Uncontrolled type 2 diabetes mellitus with ESRD (end-stage renal disease) ([place]) 05/21/2014  . Hyperlipidemia 05/21/2014  . HTN (hypertension) 05/21/2014    Jamey Reas, PT, DPT 07/03/2020, 12:06 PM  [facility] Physical Therapy [address] [place], [place], [address] Phone: [phone]   Fax:  [phone]  Name: Shelley [name] MRN: [id number] Date of Birth: [birth year]

## 2020-07-04 ENCOUNTER — Ambulatory Visit (INDEPENDENT_AMBULATORY_CARE_PROVIDER_SITE_OTHER): Payer: Medicare Other | Admitting: Physical Therapy

## 2020-07-04 ENCOUNTER — Encounter: Payer: Self-pay | Admitting: Physical Therapy

## 2020-07-04 DIAGNOSIS — R2689 Other abnormalities of gait and mobility: Secondary | ICD-10-CM

## 2020-07-04 DIAGNOSIS — M6281 Muscle weakness (generalized): Secondary | ICD-10-CM

## 2020-07-04 DIAGNOSIS — R2681 Unsteadiness on feet: Secondary | ICD-10-CM | POA: Diagnosis not present

## 2020-07-04 DIAGNOSIS — R293 Abnormal posture: Secondary | ICD-10-CM | POA: Diagnosis not present

## 2020-07-04 NOTE — Therapy (Signed)
[facility] Physical Therapy [address] [place], [place], [address] Phone: [phone]   Fax:  [phone]  Physical Therapy Treatment  Patient Details  Name: Shelley [name] MRN: [id number] Date of Birth: 1950/05/29 Referring Provider (PT): Sallee Lange, MD   Encounter Date: 07/04/2020   PT End of Session - 07/04/20 0850    Visit Number 6    Number of Visits 25    Date for PT Re-Evaluation 09/03/20    Authorization Type Medicare & BCBS supp    PT Start Time 0845    PT Stop Time 0931    PT Time Calculation (min) 46 min    Equipment Utilized During Treatment Gait belt    Activity Tolerance Patient tolerated treatment well    Behavior During Therapy [facility] for tasks assessed/performed           Past Medical History:  Diagnosis Date  . Diabetes ([place])   . Diabetes mellitus without complication ([place])   . End stage renal disease ([place])   . HTN (hypertension)   . Hyperlipidemia   . Hypertension     Past Surgical History:  Procedure Laterality Date  . ABDOMINAL HYSTERECTOMY     fibroids/complete hysterec 2000  . APPENDECTOMY    . CATARACT EXTRACTION Bilateral   . COLONOSCOPY N/A 03/12/2016   Procedure: COLONOSCOPY;  Surgeon: Daneil Dolin, MD;  [facility];  Service: Endoscopy;  Laterality: N/A;  9:30 am  . POLYPECTOMY  03/12/2016   Procedure: POLYPECTOMY;  Surgeon: Daneil Dolin, MD;  [facility];  Service: Endoscopy;;  colon  . YAG LASER APPLICATION Left 17/0/0174   Procedure: YAG LASER APPLICATION;  Surgeon: Williams Che, MD;  Location: [facility];  Service: Ophthalmology;  Laterality: Left;    There were no vitals filed for this visit.   Subjective Assessment - 07/04/20 0845    Subjective She drove her Z car with clutch.  the exercises are going well.    Pertinent History CKD st 5, DM2, ESRD, HTN, renal CA, kidney transplant 08/30/2017, Thoracotomy with lobectomy 02/24/2018,    Patient Stated Goals to be active like  previously including working out (treadmill, weight training)    Currently in Pain? No/denies                             [facility] Adult PT Treatment/Exercise - 07/04/20 0845      Transfers   Transfers Sit to Stand;Stand to Sit    Sit to Stand 5: Supervision;With upper extremity assist;From chair/3-in-1    Sit to Stand Details Visual cues for safe use of DME/AE;Verbal cues for technique    Stand to Sit 5: Supervision;With upper extremity assist;To chair/3-in-1    Stand to Sit Details (indicate cue type and reason) Visual cues for safe use of DME/AE;Verbal cues for technique      Ambulation/Gait   Ambulation/Gait Yes    Ambulation/Gait Assistance 5: Supervision    Ambulation/Gait Assistance Details tactile & verbal cues on balance reactions & upright posture    Ambulation Distance (Feet) 300 Feet   300' RW & 100' cane stand alone tip   Assistive device Prosthesis;Straight cane;Rolling walker    Ramp --    Curb --      Self-Care   Self-Care Other Self-Care Comments    Lifting --    Other Self-Care Comments  driving a car with clutch with Left TTA prosthesis using hip / knee leg press  motion with tactile, demo & verbal cues on motion. Pt verbalized & return demo understanding along with recommendation to practicing.      Exercises   Exercises Knee/Hip      Prosthetics   Prosthetic Care Comments  PT instructed in need to wear shrinker at night to control limb volume fluctuations. PT demo & verbal cues on donning tubular shrinker with triple layers below knee.  PT instructed including too few, too many & proper ply socks.    Current prosthetic wear tolerance (days/week)  daily    Current prosthetic wear tolerance (#hours/day)  Pt reports most of her awake hours.    Current prosthetic weight-bearing tolerance (hours/day)  Pt tolerated standing for 8 min without limb pain.    Edema pitting with 5 sec capillary refill    Residual limb condition  pt reports no issues.     Education Provided Other (comment);Proper wear schedule/adjustment;Correct ply sock adjustment;Residual limb care   see prosthetic care comments.   Person(s) Educated Patient    Education Method Explanation;Demonstration;Tactile cues;Verbal cues;Handout    Education Method Verbalized understanding;Tactile cues required;Verbal cues required;Needs further instruction                    PT Short Term Goals - 06/05/20 1245      PT SHORT TERM GOAL #1   Title Patient demonstrates how to properly donne prosthesis and verbalizes signs of sweating inside liner.    Time 4    Period Weeks    Status New    Target Date 07/04/20      PT SHORT TERM GOAL #2   Title Patient able to pick up items from floor without UE support safely.    Time 4    Period Weeks    Status New    Target Date 07/04/20      PT SHORT TERM GOAL #3   Title Patient ambulates 200' with cane & prosthesis with supervision.    Time 4    Period Weeks    Status New    Target Date 07/04/20      PT SHORT TERM GOAL #4   Title Patient negotiates ramps & curbs with prosthesis & cane with minA.    Time 4    Period Weeks    Status New    Target Date 07/04/20             PT Long Term Goals - 06/05/20 1235      PT LONG TERM GOAL #1   Title Patient tolerates prosthesis wear >90% of awake hours without skin or limb pain issues and verbalizes proper prosthetic care.    Time 12    Period Weeks    Status New    Target Date 08/30/20      PT LONG TERM GOAL #2   Title Berg Balance >45/56    Time 12    Period Weeks    Status New    Target Date 08/30/20      PT LONG TERM GOAL #3   Title Dynamic Gait Index with cane or less & prosthesis >/= 14 / 24    Time 12    Period Weeks    Status New    Target Date 08/30/20      PT LONG TERM GOAL #4   Title Patient ambulates >500' with cane or less & prosthesis and negotiates ramps/curbs/stairs single rail modified independent.    Time 12    Period Weeks  Status New     Target Date 08/30/20      PT LONG TERM GOAL #5   Title Patient verbalizes & demonstrates understanding of how to properly use fitness equipment to return to gym per her goal.    Time 12    Period Weeks    Status New    Target Date 08/30/20                 Plan - 07/04/20 0850    Clinical Impression Statement PT instructed in adjusting ply socks with too few, too many & correct ply and she appears to have better understanding.  PT also instructed in need to wear shrinker at night.  PT instructed in LLE movement with prosthesis to drive car with clutch.    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, Thoracotomy with lobectomy 02/24/2018,    Examination-Activity Limitations Locomotion Level;Lift;Squat;Stairs;Stand;Transfers    Examination-Participation Restrictions Community Activity    Stability/Clinical Decision Making Evolving/Moderate complexity    Rehab Potential Good    PT Frequency 2x / week    PT Duration 12 weeks    PT Treatment/Interventions ADLs/Self Care Home Management;DME Instruction;Gait training;Stair training;Functional mobility training;Therapeutic activities;Therapeutic exercise;Balance training;Neuromuscular re-education;Patient/family education;Prosthetic Training    PT Next Visit Plan check STGs, review prosthetic care, introduce how to use fitness center equipment with prosthesis  check & update HEP for balance & standing strength,  prosthetic gait with cane    Consulted and Agree with Plan of Care Patient           Patient will benefit from skilled therapeutic intervention in order to improve the following deficits and impairments:  Abnormal gait,Decreased activity tolerance,Decreased balance,Decreased endurance,Decreased knowledge of use of DME,Decreased mobility,Decreased range of motion,Increased edema,Postural  dysfunction,Prosthetic Dependency  Visit Diagnosis: Unsteadiness on feet  Other abnormalities of gait and mobility  Muscle weakness (generalized)  Abnormal posture     Problem List Patient Active Problem List   Diagnosis Date Noted  . Uncontrolled type 2 diabetes mellitus with hyperglycemia, with long-term current use of insulin ([place]) 08/27/2017  . Hyperlipidemia associated with type 2 diabetes mellitus ([place]) 03/18/2017  . Chronic renal disease, stage V ([place]) 08/20/2015  . Personal history of noncompliance with medical treatment, presenting hazards to health 08/20/2015  . Proteinuria 07/04/2014  . Uncontrolled type 2 diabetes mellitus with ESRD (end-stage renal disease) ([place]) 05/21/2014  . Hyperlipidemia 05/21/2014  . HTN (hypertension) 05/21/2014    Jamey Reas, PT, DPT 07/04/2020, 1:01 PM  [facility] Physical Therapy [address] [place], [place], [address] Phone: [phone]   Fax:  [phone]  Name: Shelley [name] MRN: [id number] Date of Birth: 1951-02-08

## 2020-07-04 NOTE — Patient Instructions (Signed)
Hanger Socks: 1-ply is yellow color at top, 3-ply is green at top, 5-ply is navy blue at top How many ply you need depends on your limb size.  You should have even pressure on your limb when standing & walking.  Guidance points: 1. How ease it goes on? Should be some resistance. Too few it goes on too easily. Too many it takes a lot of work to get it on. 2. How many clicks you get. Especially clicks in sitting. 3. After standing or walking, check knee cap. Bottom should be just under the front lip.  Too few bottom of knee cap sits on indention. Too many bottom is above front lip. 4. Have your feet beside each other & hips over feet. Place hands on your waist. Pelvis Should be level. Too few prosthetic side will be low. Too many prosthetic side will be high.    Get ply socks correct before you leave the house. Take extra socks with you. Take one 3-ply and two 1-ply with you. This is in addition to what you are wearing.   

## 2020-07-09 ENCOUNTER — Other Ambulatory Visit: Payer: Self-pay

## 2020-07-09 ENCOUNTER — Encounter: Payer: Self-pay | Admitting: Physical Therapy

## 2020-07-09 ENCOUNTER — Ambulatory Visit (INDEPENDENT_AMBULATORY_CARE_PROVIDER_SITE_OTHER): Payer: Medicare Other | Admitting: Physical Therapy

## 2020-07-09 DIAGNOSIS — M6281 Muscle weakness (generalized): Secondary | ICD-10-CM | POA: Diagnosis not present

## 2020-07-09 DIAGNOSIS — R293 Abnormal posture: Secondary | ICD-10-CM

## 2020-07-09 DIAGNOSIS — R2689 Other abnormalities of gait and mobility: Secondary | ICD-10-CM

## 2020-07-09 DIAGNOSIS — R2681 Unsteadiness on feet: Secondary | ICD-10-CM | POA: Diagnosis not present

## 2020-07-09 NOTE — Therapy (Signed)
[facility] Physical Therapy [address] [place], [place], [address] Phone: [phone]   Fax:  [phone]  Physical Therapy Treatment  Patient Details  Name: Shelley [name] MRN: [id number] Date of Birth: [birth year] Referring Provider (PT): Sallee Lange, MD   Encounter Date: 07/09/2020   PT End of Session - 07/09/20 0807    Visit Number 7    Number of Visits 25    Date for PT Re-Evaluation 09/03/20    Authorization Type Medicare & BCBS supp    PT Start Time 0802    PT Stop Time 0848    PT Time Calculation (min) 46 min    Equipment Utilized During Treatment Gait belt    Activity Tolerance Patient tolerated treatment well    Behavior During Therapy [facility] for tasks assessed/performed           Past Medical History:  Diagnosis Date  . Diabetes ([place])   . Diabetes mellitus without complication ([place])   . End stage renal disease ([place])   . HTN (hypertension)   . Hyperlipidemia   . Hypertension     Past Surgical History:  Procedure Laterality Date  . ABDOMINAL HYSTERECTOMY     fibroids/complete hysterec 2000  . APPENDECTOMY    . CATARACT EXTRACTION Bilateral   . COLONOSCOPY N/A 03/12/2016   Procedure: COLONOSCOPY;  Surgeon: Daneil Dolin, MD;  [facility];  Service: Endoscopy;  Laterality: N/A;  9:30 am  . POLYPECTOMY  03/12/2016   Procedure: POLYPECTOMY;  Surgeon: Daneil Dolin, MD;  [facility];  Service: Endoscopy;;  colon  . YAG LASER APPLICATION Left 95/04/8411   Procedure: YAG LASER APPLICATION;  Surgeon: Williams Che, MD;  Location: [facility];  Service: Ophthalmology;  Laterality: Left;    There were no vitals filed for this visit.   Subjective Assessment - 07/09/20 0802    Subjective She followed PT recommendation for bathing & it helped.    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 No/denies                             [facility] Adult PT Treatment/Exercise - 07/09/20 0802      Transfers   Transfers Sit to Stand;Stand to Sit    Sit to Stand 6: Modified independent (Device/Increase time);With upper extremity assist;From chair/3-in-1    Sit to Stand Details --    Stand to Sit 6: Modified independent (Device/Increase time);With upper extremity assist;To chair/3-in-1    Stand to Sit Details (indicate cue type and reason) --      Ambulation/Gait   Ambulation/Gait Yes    Ambulation/Gait Assistance 5: Supervision    Ambulation/Gait Assistance Details worked on [place] ([place]et) [address] device Prosthesis;Straight cane    Ambulation Surface Level;Indoor    Ramp 5: Supervision   Hurry cane & TTA prosthesis   Ramp Details (indicate cue type and reason) demo, tactile & verbal cues on technique    Curb 5: Supervision   Hurry cane & TTA prosthesis   Curb Details (indicate cue type and reason) demo, tactile & verbal cues on technique      Self-Care   Self-Care ADL's  ADL's when standing stationary for stationary ADLs shift pelvis so equal weight on LEs prior to intiating activity. Pt verbalized & return demo understanding with tactile cues.    Lifting Pt able to pick up item from floor safely    Other Self-Care Comments  --      Exercises   Exercises Knee/Hip      Knee/Hip Exercises: Stretches   Active Hamstring Stretch Left;3 reps;20 seconds    Active Hamstring Stretch Limitations supine knee extended with strap DF      Knee/Hip Exercises: Aerobic   Tread Mill 1.0pmh for 82mn 2 sets with BUE support, cues to not releasae bars for safety but minimize lifting. PT instructed verbal & demo cues on safety including getting on / off.      Knee/Hip Exercises: Machines for Strengthening   Cybex Leg Press shuttle  leg press 100# 15 reps 2 sets 1st set back 45* & 2ns set back flat      Prosthetics   Prosthetic Care Comments  Pt verbalized proper sweat management & donning.    Current prosthetic wear tolerance (days/week)  daily    Current prosthetic wear tolerance (#hours/day)  most of awake hours    Current prosthetic weight-bearing tolerance (hours/day)  Pt tolerated standing for 8 min without limb pain but right hip gets sore.  PT instructed in standing with equal wt on LEs    Edema pitting with 5 sec capillary refill    Residual limb condition  pt reports no issues.    Education Provided --                  PT Education - 07/09/20 0851    Education Details Walking program to improve stamina & endurance - see pt instructions    Person(s) Educated Patient    Methods Explanation;Verbal cues;Handout    Comprehension Verbalized understanding;Verbal cues required;Need further instruction            PT Short Term Goals - 07/09/20 0901      PT SHORT TERM GOAL #1   Title Patient demonstrates how to properly donne prosthesis and verbalizes signs of sweating inside liner.    Time 4    Period Weeks    Status Achieved    Target Date 07/04/20      PT SHORT TERM GOAL #2   Title Patient able to pick up items from floor without UE support safely.    Time 4    Period Weeks    Status Achieved    Target Date 07/04/20      PT SHORT TERM GOAL #3   Title Patient ambulates 200' with cane & prosthesis with supervision.    Time 4    Period Weeks    Status Achieved    Target Date 07/04/20      PT SHORT TERM GOAL #4   Title Patient negotiates ramps & curbs with prosthesis & cane with minA.    Time 4    Period Weeks    Status Achieved    Target Date 07/04/20             PT Long Term Goals - 06/05/20 1235      PT LONG TERM GOAL #1   Title Patient tolerates prosthesis wear >90% of awake hours without skin or limb pain issues and verbalizes proper prosthetic care.    Time 12    Period  Weeks    Status New    Target Date 08/30/20  PT LONG TERM GOAL #2   Title Berg Balance >45/56    Time 12    Period Weeks    Status New    Target Date 08/30/20      PT LONG TERM GOAL #3   Title Dynamic Gait Index with cane or less & prosthesis >/= 14 / 24    Time 12    Period Weeks    Status New    Target Date 08/30/20      PT LONG TERM GOAL #4   Title Patient ambulates >500' with cane or less & prosthesis and negotiates ramps/curbs/stairs single rail modified independent.    Time 12    Period Weeks    Status New    Target Date 08/30/20      PT LONG TERM GOAL #5   Title Patient verbalizes & demonstrates understanding of how to properly use fitness equipment to return to gym per her goal.    Time 12    Period Weeks    Status New    Target Date 08/30/20                 Plan - 07/09/20 0807    Clinical Impression Statement Patient met all STGs set for initial 30 days.  She is improving mobility & safety.  Pt reports that she has a treadmill at home. PT began instruction in safe use of treadmill and would benefit from one more session prior to trying at home.    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, Thoracotomy with lobectomy 02/24/2018,    Examination-Activity Limitations Locomotion Level;Lift;Squat;Stairs;Stand;Transfers    Examination-Participation Restrictions Community Activity    Stability/Clinical Decision Making Evolving/Moderate complexity    Rehab Potential Good    PT Frequency 2x / week    PT Duration 12 weeks    PT Treatment/Interventions ADLs/Self Care Home Management;DME Instruction;Gait training;Stair training;Functional mobility training;Therapeutic activities;Therapeutic exercise;Balance training;Neuromuscular re-education;Patient/family education;Prosthetic Training    PT Next Visit Plan set updated  STGs, review prosthetic care, introduce how to use fitness center equipment with prosthesis  check & update HEP for balance & standing strength,  prosthetic gait with cane    Consulted and Agree with Plan of Care Patient           Patient will benefit from skilled therapeutic intervention in order to improve the following deficits and impairments:  Abnormal gait,Decreased activity tolerance,Decreased balance,Decreased endurance,Decreased knowledge of use of DME,Decreased mobility,Decreased range of motion,Increased edema,Postural dysfunction,Prosthetic Dependency  Visit Diagnosis: Other abnormalities of gait and mobility  Unsteadiness on feet  Muscle weakness (generalized)  Abnormal posture     Problem List Patient Active Problem List   Diagnosis Date Noted  . Uncontrolled type 2 diabetes mellitus with hyperglycemia, with long-term current use of insulin ([place]) 08/27/2017  . Hyperlipidemia associated with type 2 diabetes mellitus ([place]) 03/18/2017  . Chronic renal disease, stage V ([place]) 08/20/2015  . Personal history of noncompliance with medical treatment, presenting hazards to health 08/20/2015  . Proteinuria 07/04/2014  . Uncontrolled type 2 diabetes mellitus with ESRD (end-stage renal disease) ([place]) 05/21/2014  . Hyperlipidemia 05/21/2014  . HTN (hypertension) 05/21/2014    Jamey Reas, PT, DPT 07/09/2020, 9:03 AM  [facility] Physical Therapy [address] [place], [place], [address] Phone: [phone]   Fax:  [phone]  Name: Shelley [name] MRN: [id number] Date of Birth: [birth year]

## 2020-07-09 NOTE — Patient Instructions (Addendum)
Increasing your activity level is important. Short distances which is walking from one room to another. Work to increase frequency back to prior level. Try without device unless you fatigue then use cane.  Medium distances are entering & exiting your home or community with limited distances. Start with 4 medium walks which is one outing to one location and increase number of tolerated amounts. Try medium walks with your cane.  Long distance is your highest tolerance for you. Walk until you feel you must rest. Back or leg pain or general fatigue are indicators to maximum tolerance. Monitor by distance or time. Try to walk your BEST distance 1-2 times per day. You should see this increase over time. Long walks with your walker but try to lighten up how much you lean on walker

## 2020-07-10 ENCOUNTER — Ambulatory Visit (INDEPENDENT_AMBULATORY_CARE_PROVIDER_SITE_OTHER): Payer: Medicare Other | Admitting: Physical Therapy

## 2020-07-10 ENCOUNTER — Encounter: Payer: Self-pay | Admitting: Physical Therapy

## 2020-07-10 DIAGNOSIS — M6281 Muscle weakness (generalized): Secondary | ICD-10-CM

## 2020-07-10 DIAGNOSIS — R2689 Other abnormalities of gait and mobility: Secondary | ICD-10-CM

## 2020-07-10 DIAGNOSIS — R293 Abnormal posture: Secondary | ICD-10-CM

## 2020-07-10 DIAGNOSIS — R2681 Unsteadiness on feet: Secondary | ICD-10-CM | POA: Diagnosis not present

## 2020-07-10 NOTE — Therapy (Signed)
[facility] Physical Therapy [address] [place], [place], [address] Phone: [phone]   Fax:  [phone]  Physical Therapy Treatment  Patient Details  Name: Shelley [name] MRN: [id number] Date of Birth: March 12, 1951 Referring Provider (PT): Sallee Lange, MD   Encounter Date: 07/10/2020   PT End of Session - 07/10/20 0752    Visit Number 8    Number of Visits 25    Date for PT Re-Evaluation 09/03/20    Authorization Type Medicare & BCBS supp    Progress Note Due on Visit 10    PT Start Time 0755    PT Stop Time 0840    PT Time Calculation (min) 45 min    Equipment Utilized During Treatment Gait belt    Activity Tolerance Patient tolerated treatment well    Behavior During Therapy [facility] for tasks assessed/performed           Past Medical History:  Diagnosis Date  . Diabetes ([place])   . Diabetes mellitus without complication ([place])   . End stage renal disease ([place])   . HTN (hypertension)   . Hyperlipidemia   . Hypertension     Past Surgical History:  Procedure Laterality Date  . ABDOMINAL HYSTERECTOMY     fibroids/complete hysterec 2000  . APPENDECTOMY    . CATARACT EXTRACTION Bilateral   . COLONOSCOPY N/A 03/12/2016   Procedure: COLONOSCOPY;  Surgeon: Daneil Dolin, MD;  [facility];  Service: Endoscopy;  Laterality: N/A;  9:30 am  . POLYPECTOMY  03/12/2016   Procedure: POLYPECTOMY;  Surgeon: Daneil Dolin, MD;  [facility];  Service: Endoscopy;;  colon  . YAG LASER APPLICATION Left 93/09/9022   Procedure: YAG LASER APPLICATION;  Surgeon: Williams Che, MD;  Location: [facility];  Service: Ophthalmology;  Laterality: Left;    There were no vitals filed for this visit.   Subjective Assessment - 07/10/20 0800    Subjective No issues except limb itched a lot.    Pertinent History CKD st 5, DM2, ESRD, HTN, renal CA, kidney transplant 08/30/2017, Thoracotomy with lobectomy 02/24/2018,    Patient Stated Goals to be active like  previously including working out (treadmill, weight training)    Currently in Pain? No/denies                             [facility] Adult PT Treatment/Exercise - 07/10/20 0759      Transfers   Transfers Sit to Stand;Stand to Sit    Sit to Stand 6: Modified independent (Device/Increase time);With upper extremity assist;From chair/3-in-1    Stand to Sit 6: Modified independent (Device/Increase time);With upper extremity assist;To chair/3-in-1      Ambulation/Gait   Ambulation/Gait Yes    Ambulation/Gait Assistance 5: Supervision    Ambulation Distance (Feet) 100 Feet    Assistive device Prosthesis;Straight cane    Ambulation Surface Level;Indoor    Ramp 5: Supervision   Hurry cane & TTA prosthesis   Ramp Details (indicate cue type and reason) verbal cues on technique    Curb 5: Supervision   Hurry cane & TTA prosthesis   Curb Details (indicate cue type and reason) verbal cues on technique      Self-Care   Self-Care ADL's    ADL's --    Lifting --      Exercises   Exercises Knee/Hip      Knee/Hip Exercises: Stretches   Active Hamstring Stretch Left;3 reps;20 seconds  Active Hamstring Stretch Limitations supine knee extended with strap DF      Knee/Hip Exercises: Aerobic   Tread Mill 1.5pmh for 43min 2 sets with BUE support, cues to not releasae bars for safety but minimize lifting. minimal cues for technique.      Knee/Hip Exercises: Machines for Strengthening   Cybex Leg Press shuttle leg press 100# 15 reps 2 sets 1st set back 45* & 2nd set back flat      Prosthetics   Prosthetic Care Comments  PT instructed in "itchy" feeling typical if light sweat on limb esp when remove liner & need to use damp or dry cloth instead of scratching. PT demo & instructed in donning & use of Tegaderm    Current prosthetic wear tolerance (days/week)  daily    Current prosthetic wear tolerance (#hours/day)  most of awake hours    Current prosthetic weight-bearing tolerance  (hours/day)  Pt tolerated standing for 8 min without limb pain but right hip gets sore.  PT instructed in standing with equal wt on LEs    Edema pitting with 5 sec capillary refill    Residual limb condition  Pt has 3 superficial wounds on tibial crest from scratching with no signs of infection.    Education Provided Residual limb care;Other (comment)   See prosthetic care comments   Person(s) Educated Patient    Education Method Explanation;Verbal cues    Education Method Verbalized understanding;Verbal cues required                    PT Short Term Goals - 07/10/20 0756      PT SHORT TERM GOAL #1   Title Patient verbalizes proper sweat management & signs of sweat inside liner.    Time 4    Period Weeks    Status New    Target Date 08/01/20      PT SHORT TERM GOAL #2   Title Berg balace test 40/56    Time 4    Period Weeks    Status New    Target Date 08/01/20      PT SHORT TERM GOAL #3   Title Patient ambulates 400' with cane & prosthesis with supervision scanning environment.    Time 4    Period Weeks    Status Revised    Target Date 08/01/20      PT SHORT TERM GOAL #4   Title Patient negotiates ramps & curbs with prosthesis & cane with supervision.    Time 4    Period Weeks    Status Revised    Target Date 08/01/20             PT Long Term Goals - 06/05/20 1235      PT LONG TERM GOAL #1   Title Patient tolerates prosthesis wear >90% of awake hours without skin or limb pain issues and verbalizes proper prosthetic care.    Time 12    Period Weeks    Status New    Target Date 08/30/20      PT LONG TERM GOAL #2   Title Berg Balance >45/56    Time 12    Period Weeks    Status New    Target Date 08/30/20      PT LONG TERM GOAL #3   Title Dynamic Gait Index with cane or less & prosthesis >/= 14 / 24    Time 12    Period Weeks    Status New  Target Date 08/30/20      PT LONG TERM GOAL #4   Title Patient ambulates >500' with cane or less &  prosthesis and negotiates ramps/curbs/stairs single rail modified independent.    Time 12    Period Weeks    Status New    Target Date 08/30/20      PT LONG TERM GOAL #5   Title Patient verbalizes & demonstrates understanding of how to properly use fitness equipment to return to gym per her goal.    Time 12    Period Weeks    Status New    Target Date 08/30/20                 Plan - 07/10/20 0755    Clinical Impression Statement Patient has superficial wounds on tibia from scratching but she verbalizes understanding of care & future technique to not scratch.  She appears safe to use her treadmill at home.    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, Thoracotomy with lobectomy 02/24/2018,    Examination-Activity Limitations Locomotion Level;Lift;Squat;Stairs;Stand;Transfers    Examination-Participation Restrictions Community Activity    Stability/Clinical Decision Making Evolving/Moderate complexity    Rehab Potential Good    PT Frequency 2x / week    PT Duration 12 weeks    PT Treatment/Interventions ADLs/Self Care Home Management;DME Instruction;Gait training;Stair training;Functional mobility training;Therapeutic activities;Therapeutic exercise;Balance training;Neuromuscular re-education;Patient/family education;Prosthetic Training    PT Next Visit Plan work towards updated STGs, review prosthetic care, introduce how to use fitness center equipment with prosthesis  check & update HEP for balance & standing strength,  prosthetic gait with cane    Consulted and Agree with Plan of Care Patient           Patient will benefit from skilled therapeutic intervention in order to improve the following deficits and impairments:  Abnormal gait,Decreased activity tolerance,Decreased balance,Decreased endurance,Decreased knowledge of use of  DME,Decreased mobility,Decreased range of motion,Increased edema,Postural dysfunction,Prosthetic Dependency  Visit Diagnosis: Other abnormalities of gait and mobility  Unsteadiness on feet  Muscle weakness (generalized)  Abnormal posture     Problem List Patient Active Problem List   Diagnosis Date Noted  . Uncontrolled type 2 diabetes mellitus with hyperglycemia, with long-term current use of insulin ([place]) 08/27/2017  . Hyperlipidemia associated with type 2 diabetes mellitus ([place]) 03/18/2017  . Chronic renal disease, stage V ([name]) 08/20/2015  . Personal history of noncompliance with medical treatment, presenting hazards to health 08/20/2015  . Proteinuria 07/04/2014  . Uncontrolled type 2 diabetes mellitus with ESRD (end-stage renal disease) ([place]) 05/21/2014  . Hyperlipidemia 05/21/2014  . HTN (hypertension) 05/21/2014    Jamey Reas, PT, DPT 07/10/2020, 8:44 AM  [facility] Physical Therapy [address] [place], [place], [address] Phone: [phone]   Fax:  [phone]  Name: Shelley [name] MRN: [id number] Date of Birth: [birth year]

## 2020-07-16 ENCOUNTER — Ambulatory Visit (INDEPENDENT_AMBULATORY_CARE_PROVIDER_SITE_OTHER): Payer: Medicare Other | Admitting: Physical Therapy

## 2020-07-16 ENCOUNTER — Other Ambulatory Visit: Payer: Self-pay

## 2020-07-16 ENCOUNTER — Encounter: Payer: Self-pay | Admitting: Physical Therapy

## 2020-07-16 DIAGNOSIS — M6281 Muscle weakness (generalized): Secondary | ICD-10-CM

## 2020-07-16 DIAGNOSIS — R2689 Other abnormalities of gait and mobility: Secondary | ICD-10-CM | POA: Diagnosis not present

## 2020-07-16 DIAGNOSIS — R2681 Unsteadiness on feet: Secondary | ICD-10-CM | POA: Diagnosis not present

## 2020-07-16 DIAGNOSIS — R293 Abnormal posture: Secondary | ICD-10-CM | POA: Diagnosis not present

## 2020-07-16 NOTE — Therapy (Signed)
[facility] Physical Therapy [address] [place], [place], [address] Phone: [phone]   Fax:  [phone]  Physical Therapy Treatment  Patient Details  Name: Honor Frison MRN: [id number] Date of Birth: [birth year] Referring Provider (PT): Sallee Lange, MD   Encounter Date: 07/16/2020   PT End of Session - 07/16/20 0758    Visit Number 9    Number of Visits 25    Date for PT Re-Evaluation 09/03/20    Authorization Type Medicare & BCBS supp    Progress Note Due on Visit 10    PT Start Time 0758    PT Stop Time 0843    PT Time Calculation (min) 45 min    Equipment Utilized During Treatment Gait belt    Activity Tolerance Patient tolerated treatment well    Behavior During Therapy [facility] for tasks assessed/performed           Past Medical History:  Diagnosis Date  . Diabetes ([place])   . Diabetes mellitus without complication ([place])   . End stage renal disease ([place])   . HTN (hypertension)   . Hyperlipidemia   . Hypertension     Past Surgical History:  Procedure Laterality Date  . ABDOMINAL HYSTERECTOMY     fibroids/complete hysterec 2000  . APPENDECTOMY    . CATARACT EXTRACTION Bilateral   . COLONOSCOPY N/A 03/12/2016   Procedure: COLONOSCOPY;  Surgeon: Daneil Dolin, MD;  [facility];  Service: Endoscopy;  Laterality: N/A;  9:30 am  . POLYPECTOMY  03/12/2016   Procedure: POLYPECTOMY;  Surgeon: Daneil Dolin, MD;  [facility];  Service: Endoscopy;;  colon  . YAG LASER APPLICATION Left 05/25/5972   Procedure: YAG LASER APPLICATION;  Surgeon: Williams Che, MD;  Location: [facility];  Service: Ophthalmology;  Laterality: Left;    There were no vitals filed for this visit.   Subjective Assessment - 07/16/20 0759    Subjective She is still waiting on dye test for RLE circulation study.    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 No/denies                             [facility] Adult PT Treatment/Exercise - 07/16/20 0758      Transfers   Transfers Sit to Stand;Stand to Sit    Sit to Stand 6: Modified independent (Device/Increase time);With upper extremity assist;From chair/3-in-1    Stand to Sit 6: Modified independent (Device/Increase time);With upper extremity assist;To chair/3-in-1      Ambulation/Gait   Ambulation/Gait Yes    Ambulation/Gait Assistance 5: Supervision    Ambulation Distance (Feet) 100 Feet    Assistive device Prosthesis;Straight cane    Ramp 5: Supervision   Hurry cane & TTA prosthesis   Curb 5: Supervision   Hurry cane & TTA prosthesis     Self-Care   Self-Care ADL's    Lifting PT educated with discussion on donning prosthesis at night to toilet including hotels & visiting with friends.  PT recommending night light or at least flashlight. pt verbalized understanding      Exercises   Exercises Knee/Hip      Knee/Hip Exercises: Stretches   Active Hamstring Stretch Left;3 reps;20 seconds    Active Hamstring Stretch Limitations supine  knee extended with strap DF      Knee/Hip Exercises: Aerobic   Tread Mill 1.6pmh for 81min 1 set with 15 sec with 1 bout at 1.42mph & 1 at 1.10mph with BUE support, cues to take quicker & longer stride to increase pace.    Other Aerobic treadmill to work on inclines 1.31mph for 2 min uphill & 2 min downhilll grade 4.0-7.0%      Knee/Hip Exercises: Machines for Strengthening   Cybex Leg Press shuttle leg press 100# 15 reps 2 sets 1st set back 45* & 2nd set back flat      Prosthetics   Prosthetic Care Comments  --    Current prosthetic wear tolerance (days/week)  daily    Current prosthetic wear tolerance (#hours/day)  most of awake hours    Current prosthetic weight-bearing tolerance (hours/day)  Pt tolerated standing for 8 min without  limb pain but right hip gets sore.  PT instructed in standing with equal wt on LEs    Edema pitting with 5 sec capillary refill    Residual limb condition  Pt has 3 superficial wounds on tibial crest from scratching with no signs of infection.    Education Provided Other (comment)   See prosthetic care comments                   PT Short Term Goals - 07/10/20 0756      PT SHORT TERM GOAL #1   Title Patient verbalizes proper sweat management & signs of sweat inside liner.    Time 4    Period Weeks    Status New    Target Date 08/01/20      PT SHORT TERM GOAL #2   Title Berg balace test 40/56    Time 4    Period Weeks    Status New    Target Date 08/01/20      PT SHORT TERM GOAL #3   Title Patient ambulates 400' with cane & prosthesis with supervision scanning environment.    Time 4    Period Weeks    Status Revised    Target Date 08/01/20      PT SHORT TERM GOAL #4   Title Patient negotiates ramps & curbs with prosthesis & cane with supervision.    Time 4    Period Weeks    Status Revised    Target Date 08/01/20             PT Long Term Goals - 06/05/20 1235      PT LONG TERM GOAL #1   Title Patient tolerates prosthesis wear >90% of awake hours without skin or limb pain issues and verbalizes proper prosthetic care.    Time 12    Period Weeks    Status New    Target Date 08/30/20      PT LONG TERM GOAL #2   Title Berg Balance >45/56    Time 12    Period Weeks    Status New    Target Date 08/30/20      PT LONG TERM GOAL #3   Title Dynamic Gait Index with cane or less & prosthesis >/= 14 / 24    Time 12    Period Weeks    Status New    Target Date 08/30/20      PT LONG TERM GOAL #4   Title Patient ambulates >500' with cane or less & prosthesis and negotiates ramps/curbs/stairs single rail modified independent.  Time 12    Period Weeks    Status New    Target Date 08/30/20      PT LONG TERM GOAL #5   Title Patient verbalizes &  demonstrates understanding of how to properly use fitness equipment to return to gym per her goal.    Time 12    Period Weeks    Status New    Target Date 08/30/20                 Plan - 07/16/20 0758    Clinical Impression Statement PT used treadmill to work on inclines & increasing pace.  PT also used leg press to simulate single leg stance with knee extension.  She improved both with instruction.    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, Thoracotomy with lobectomy 02/24/2018,    Examination-Activity Limitations Locomotion Level;Lift;Squat;Stairs;Stand;Transfers    Examination-Participation Restrictions Community Activity    Stability/Clinical Decision Making Evolving/Moderate complexity    Rehab Potential Good    PT Frequency 2x / week    PT Duration 12 weeks    PT Treatment/Interventions ADLs/Self Care Home Management;DME Instruction;Gait training;Stair training;Functional mobility training;Therapeutic activities;Therapeutic exercise;Balance training;Neuromuscular re-education;Patient/family education;Prosthetic Training    PT Next Visit Plan do 10th visit note, work towards updated STGs, review prosthetic care, introduce how to use fitness center equipment with prosthesis  check & update HEP for balance & standing strength,  prosthetic gait with cane    Consulted and Agree with Plan of Care Patient           Patient will benefit from skilled therapeutic intervention in order to improve the following deficits and impairments:  Abnormal gait,Decreased activity tolerance,Decreased balance,Decreased endurance,Decreased knowledge of use of DME,Decreased mobility,Decreased range of motion,Increased edema,Postural dysfunction,Prosthetic Dependency  Visit Diagnosis: Other abnormalities of gait and mobility  Unsteadiness on  feet  Muscle weakness (generalized)  Abnormal posture     Problem List Patient Active Problem List   Diagnosis Date Noted  . Uncontrolled type 2 diabetes mellitus with hyperglycemia, with long-term current use of insulin ([place]) 08/27/2017  . Hyperlipidemia associated with type 2 diabetes mellitus ([place]) 03/18/2017  . Chronic renal disease, stage V ([place]) 08/20/2015  . Personal history of noncompliance with medical treatment, presenting hazards to health 08/20/2015  . Proteinuria 07/04/2014  . Uncontrolled type 2 diabetes mellitus with ESRD (end-stage renal disease) ([place]) 05/21/2014  . Hyperlipidemia 05/21/2014  . HTN (hypertension) 05/21/2014    Jamey Reas, PT, DPT 07/16/2020, 8:44 AM  [facility] Physical Therapy [address] [place], [place], [address] Phone: [phone]   Fax:  [phone]  Name: Adna Nofziger MRN: [id number] Date of Birth: December 23, 1950

## 2020-07-17 ENCOUNTER — Encounter: Payer: Self-pay | Admitting: Physical Therapy

## 2020-07-17 ENCOUNTER — Ambulatory Visit (INDEPENDENT_AMBULATORY_CARE_PROVIDER_SITE_OTHER): Payer: Medicare Other | Admitting: Physical Therapy

## 2020-07-17 DIAGNOSIS — R293 Abnormal posture: Secondary | ICD-10-CM

## 2020-07-17 DIAGNOSIS — R2681 Unsteadiness on feet: Secondary | ICD-10-CM

## 2020-07-17 DIAGNOSIS — R2689 Other abnormalities of gait and mobility: Secondary | ICD-10-CM

## 2020-07-17 DIAGNOSIS — M6281 Muscle weakness (generalized): Secondary | ICD-10-CM

## 2020-07-17 NOTE — Therapy (Signed)
[facility] Physical Therapy [address] [place], [place], [address] Phone: [phone]   Fax:  [phone]  Physical Therapy Treatment & 10th Visit Note  Shelley [name] Details  Name: Shelley [name] MRN: [id number] Date of Birth: 1951-01-23 Referring Provider (Shelley [name]): Sallee Lange, MD   Encounter Date: 07/17/2020   Progress Note Reporting Period 06/05/2020 to 07/17/2020  See note below for Objective Data and Assessment of Progress/Goals.        Shelley [name] End of Session - 07/17/20 0800    Visit Number 10    Number of Visits 25    Date for Shelley [name] Re-Evaluation 09/03/20    Authorization Type Medicare & BCBS supp    Progress Note Due on Visit 20    Shelley [name] Start Time 0755    Shelley [name] Stop Time 0840    Shelley [name] Time Calculation (min) 45 min    Equipment Utilized During Treatment Gait belt    Activity Tolerance Shelley [name] tolerated treatment well    Behavior During Therapy WFL for tasks assessed/performed           Past Medical History:  Diagnosis Date  . Diabetes ([place])   . Diabetes mellitus without complication ([place])   . End stage renal disease ([place])   . HTN (hypertension)   . Hyperlipidemia   . Hypertension     Past Surgical History:  Procedure Laterality Date  . ABDOMINAL HYSTERECTOMY     fibroids/complete hysterec 2000  . APPENDECTOMY    . CATARACT EXTRACTION Bilateral   . COLONOSCOPY N/A 03/12/2016   Procedure: COLONOSCOPY;  Surgeon: Daneil Dolin, MD;  [facility];  Service: Endoscopy;  Laterality: N/A;  9:30 am  . POLYPECTOMY  03/12/2016   Procedure: POLYPECTOMY;  Surgeon: Daneil Dolin, MD;  [facility];  Service: Endoscopy;;  colon  . YAG LASER APPLICATION Left 04/29/2534   Procedure: YAG LASER APPLICATION;  Surgeon: Williams Che, MD;  Location: [facility];  Service: Ophthalmology;  Laterality: Left;    There were no vitals filed for this visit.   Subjective Assessment - 07/17/20 0755    Subjective Shelley [name] is feeling more confident in using the cane.     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 No/denies                             [facility] Adult Shelley [name] Treatment/Exercise - 07/17/20 0755      Transfers   Transfers Sit to Stand;Stand to Sit    Sit to Stand 6: Modified independent (Device/Increase time);With upper extremity assist;From chair/3-in-1    Stand to Sit 6: Modified independent (Device/Increase time);With upper extremity assist;To chair/3-in-1      Ambulation/Gait   Ambulation/Gait Yes    Ambulation/Gait Assistance 5: Supervision    Ambulation/Gait Assistance Details Shelley [name] arrived carrying bag on right shoulder which altered cane placement. Shelley [name] had her switch bag to shoulder opposite cane which improved her gait.    Ambulation Distance (Feet) 250 Feet    Assistive device Prosthesis;Straight cane    Ramp 5: Supervision   Hurry cane & TTA prosthesis   Curb 5: Supervision   Hurry cane & TTA prosthesis     Self-Care   Self-Care --    Lifting recommended practicing doffing shrinker, donning liner  prior to sitting up on edge of bed then donning prosthesis similar to procedure to toilet at night. And once sits on edge of bed, romoving prosthesis, liner & donning shrinker.  The more Shelley [name] practices it should become quicker. Shelley [name] verbalized understanding.      Exercises   Exercises Knee/Hip      Knee/Hip Exercises: Stretches   Active Hamstring Stretch Left;Right;3 reps;20 seconds    Active Hamstring Stretch Limitations supine knee extended with strap DF      Knee/Hip Exercises: Aerobic   Tread Mill 1.6pmh for 30min 1 set with 2 bouts of 30sec at 2.22mph with BUE support, cues to take quicker & longer stride to increase pace.    Other Aerobic treadmill to work on inclines 1.36mph for 2 min uphill & 2 min downhilll grade  4.0-7.0%      Knee/Hip Exercises: Machines for Strengthening   Cybex Leg Press shuttle leg press 112# 15 reps 2 sets 1st set back 45* & 2nd set back flat      Prosthetics   Prosthetic Care Comments  Shelley [name] recommended appt with prosthetist for pretibial pads & alignment.  Appt set up after Shelley [name] on 5/3.  Shelley [name] demo & verbal cues on detecting & correcting socket rotation.  Once pads are placed in socket, Shelley [name] will reduce current ply sock fit.    Current prosthetic wear tolerance (days/week)  daily    Current prosthetic wear tolerance (#hours/day)  most of awake hours    Current prosthetic weight-bearing tolerance (hours/day)  Shelley [name] tolerated standing for 8 min without limb pain but right hip gets sore.  Shelley [name] instructed in standing with equal wt on LEs    Edema pitting with 5 sec capillary refill    Residual limb condition  Shelley [name] has 3 superficial wounds on tibial crest from scratching with no signs of infection.    Education Provided Correct ply sock adjustment;Other (comment)   See prosthetic care comments   Person(s) Educated Shelley [name]    Education Method Explanation;Verbal cues    Education Method Verbalized understanding;Verbal cues required;Needs further instruction                    Shelley [name] Short Term Goals - 07/10/20 0756      Shelley [name] SHORT TERM GOAL #1   Title Shelley [name] verbalizes proper sweat management & signs of sweat inside liner.    Time 4    Period Weeks    Status New    Target Date 08/01/20      Shelley [name] SHORT TERM GOAL #2   Title Berg balace test 40/56    Time 4    Period Weeks    Status New    Target Date 08/01/20      Shelley [name] SHORT TERM GOAL #3   Title Shelley [name] ambulates 400' with cane & prosthesis with supervision scanning environment.    Time 4    Period Weeks    Status Revised    Target Date 08/01/20      Shelley [name] SHORT TERM GOAL #4   Title Shelley [name] negotiates ramps & curbs with prosthesis & cane with supervision.    Time 4    Period Weeks    Status Revised    Target Date 08/01/20              Shelley [name] Long Term Goals - 06/05/20 1235      Shelley [name] LONG TERM GOAL #1   Title Shelley [name] tolerates prosthesis wear >90% of awake hours without skin or limb  pain issues and verbalizes proper prosthetic care.    Time 12    Period Weeks    Status New    Target Date 08/30/20      Shelley [name] LONG TERM GOAL #2   Title Berg Balance >45/56    Time 12    Period Weeks    Status New    Target Date 08/30/20      Shelley [name] LONG TERM GOAL #3   Title Dynamic Gait Index with cane or less & prosthesis >/= 14 / 24    Time 12    Period Weeks    Status New    Target Date 08/30/20      Shelley [name] LONG TERM GOAL #4   Title Shelley [name] ambulates >500' with cane or less & prosthesis and negotiates ramps/curbs/stairs single rail modified independent.    Time 12    Period Weeks    Status New    Target Date 08/30/20      Shelley [name] LONG TERM GOAL #5   Title Shelley [name] verbalizes & demonstrates understanding of how to properly use fitness equipment to return to gym per her goal.    Time 12    Period Weeks    Status New    Target Date 08/30/20                 Plan - 07/17/20 0800    Clinical Impression Statement Shelley [name] is on target to meet STGs & LTGs. Shelley [name] has improved prosthetic gait with cane.  Shelley [name] using treadmill to work on increasing pace and walking on inclines which is improving along with endurance.    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, Thoracotomy with lobectomy 02/24/2018,    Examination-Activity Limitations Locomotion Level;Lift;Squat;Stairs;Stand;Transfers    Examination-Participation Restrictions Community Activity    Stability/Clinical Decision Making Evolving/Moderate complexity    Rehab Potential Good    Shelley [name] Frequency 2x / week    Shelley [name] Duration 12 weeks    Shelley [name] Treatment/Interventions ADLs/Self Care Home Management;DME Instruction;Gait training;Stair training;Functional  mobility training;Therapeutic activities;Therapeutic exercise;Balance training;Neuromuscular re-education;Shelley [name]/family education;Prosthetic Training    Shelley [name] Next Visit Plan work towards updated STGs, review prosthetic care, introduce how to use fitness center equipment with prosthesis  check & update HEP for balance & standing strength,  prosthetic gait with cane    Consulted and Agree with Plan of Care Shelley [name]           Shelley [name] will benefit from skilled therapeutic intervention in order to improve the following deficits and impairments:  Abnormal gait,Decreased activity tolerance,Decreased balance,Decreased endurance,Decreased knowledge of use of DME,Decreased mobility,Decreased range of motion,Increased edema,Postural dysfunction,Prosthetic Dependency  Visit Diagnosis: Other abnormalities of gait and mobility  Unsteadiness on feet  Muscle weakness (generalized)  Abnormal posture     Problem List Shelley [name] Active Problem List   Diagnosis Date Noted  . Uncontrolled type 2 diabetes mellitus with hyperglycemia, with long-term current use of insulin ([place]) 08/27/2017  . Hyperlipidemia associated with type 2 diabetes mellitus ([place]) 03/18/2017  . Chronic renal disease, stage V ([place]) 08/20/2015  . Personal history of noncompliance with medical treatment, presenting hazards to health 08/20/2015  . Proteinuria 07/04/2014  . Uncontrolled type 2 diabetes mellitus with ESRD (end-stage renal disease) ([place]) 05/21/2014  . Hyperlipidemia 05/21/2014  . HTN (hypertension) 05/21/2014    Shelley [name], Shelley [name], Shelley [name] 07/17/2020, 9:49 AM  [facility] Physical Therapy [place], [place],  [address] Phone: [phone]   Fax:  [phone]  Name: Shelley [name] MRN: [id number] Date of Birth: 08/30/1950

## 2020-07-23 ENCOUNTER — Other Ambulatory Visit: Payer: Self-pay

## 2020-07-23 ENCOUNTER — Ambulatory Visit (INDEPENDENT_AMBULATORY_CARE_PROVIDER_SITE_OTHER): Payer: Medicare Other | Admitting: Physical Therapy

## 2020-07-23 DIAGNOSIS — R2689 Other abnormalities of gait and mobility: Secondary | ICD-10-CM | POA: Diagnosis not present

## 2020-07-23 DIAGNOSIS — R293 Abnormal posture: Secondary | ICD-10-CM

## 2020-07-23 DIAGNOSIS — M6281 Muscle weakness (generalized): Secondary | ICD-10-CM | POA: Diagnosis not present

## 2020-07-23 DIAGNOSIS — R2681 Unsteadiness on feet: Secondary | ICD-10-CM | POA: Diagnosis not present

## 2020-07-23 NOTE — Therapy (Signed)
[facility] Physical Therapy [address] [place], [place], [address] Phone: [phone]   Fax:  [phone]  Physical Therapy Treatment  Patient Details  Name: Shatonya Passon MRN: [id number] Date of Birth: 07-May-1950 Referring Provider (PT): Sallee Lange, MD   Encounter Date: 07/23/2020   PT End of Session - 07/23/20 0835    Visit Number 11    Number of Visits 25    Date for PT Re-Evaluation 09/03/20    Authorization Type Medicare & BCBS supp    Progress Note Due on Visit 20    PT Start Time 0801    PT Stop Time 0840    PT Time Calculation (min) 39 min    Equipment Utilized During Treatment Gait belt    Activity Tolerance Patient tolerated treatment well    Behavior During Therapy [facility] for tasks assessed/performed           Past Medical History:  Diagnosis Date  . Diabetes ([place])   . Diabetes mellitus without complication ([place])   . End stage renal disease ([place])   . HTN (hypertension)   . Hyperlipidemia   . Hypertension     Past Surgical History:  Procedure Laterality Date  . ABDOMINAL HYSTERECTOMY     fibroids/complete hysterec 2000  . APPENDECTOMY    . CATARACT EXTRACTION Bilateral   . COLONOSCOPY N/A 03/12/2016   Procedure: COLONOSCOPY;  Surgeon: Daneil Dolin, MD;  [facility];  Service: Endoscopy;  Laterality: N/A;  9:30 am  . POLYPECTOMY  03/12/2016   Procedure: POLYPECTOMY;  Surgeon: Daneil Dolin, MD;  [facility];  Service: Endoscopy;;  colon  . YAG LASER APPLICATION Left 50/11/3265   Procedure: YAG LASER APPLICATION;  Surgeon: Williams Che, MD;  Location: [facility];  Service: Ophthalmology;  Laterality: Left;    There were no vitals filed for this visit.   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, Thoracotomy with lobectomy 02/24/2018,    Patient Stated Goals to  be active like previously including working out (treadmill, [employer] training)                             [facility] Adult PT Treatment/Exercise - 07/23/20 0001      Transfers   Transfers Sit to Stand;Stand to Sit    Sit to Stand 6: Modified independent (Device/Increase time);With upper extremity assist;From chair/3-in-1    Stand to Sit 6: Modified independent (Device/Increase time);With upper extremity assist;To chair/3-in-1      Ambulation/Gait   Ambulation/Gait Yes    Ambulation/Gait Assistance 5: Supervision    Ambulation Distance (Feet) 250 Feet    Assistive device Prosthesis;Straight cane    Ramp 5: Supervision   with cane   Curb 5: Supervision   with cane     Knee/Hip Exercises: Stretches   Active Hamstring Stretch Left;Right;3 reps;20 seconds    Active Hamstring Stretch Limitations supine knee extended with strap DF      Knee/Hip Exercises: Aerobic   Tread Mill 1.6pmh for 5 min      Knee/Hip Exercises: Machines for Strengthening   Cybex Knee Extension 10# bilat 4X10    Cybex Knee Flexion 25#bilat 3X10    Cybex Leg Press shuttle leg press 112# 15 reps 2 sets 1st set back 45* & 2nd set  back flat      Knee/Hip Exercises: Standing   Other Standing Knee Exercises 6 inch step taps X10 bilat without UE support. Then 6 inch step ups with Rt UE support X10 on Rt and 2X 5 on Lt      Knee/Hip Exercises: Seated   Sit to Sand 10 reps;without UE support                    PT Short Term Goals - 07/10/20 0756      PT SHORT TERM GOAL #1   Title Patient verbalizes proper sweat management & signs of sweat inside liner.    Time 4    Period Weeks    Status New    Target Date 08/01/20      PT SHORT TERM GOAL #2   Title Berg balace test 40/56    Time 4    Period Weeks    Status New    Target Date 08/01/20      PT SHORT TERM GOAL #3   Title Patient ambulates 400' with cane & prosthesis with supervision scanning environment.    Time 4    Period Weeks     Status Revised    Target Date 08/01/20      PT SHORT TERM GOAL #4   Title Patient negotiates ramps & curbs with prosthesis & cane with supervision.    Time 4    Period Weeks    Status Revised    Target Date 08/01/20             PT Long Term Goals - 06/05/20 1235      PT LONG TERM GOAL #1   Title Patient tolerates prosthesis wear >90% of awake hours without skin or limb pain issues and verbalizes proper prosthetic care.    Time 12    Period Weeks    Status New    Target Date 08/30/20      PT LONG TERM GOAL #2   Title Berg Balance >45/56    Time 12    Period Weeks    Status New    Target Date 08/30/20      PT LONG TERM GOAL #3   Title Dynamic Gait Index with cane or less & prosthesis >/= 14 / 24    Time 12    Period Weeks    Status New    Target Date 08/30/20      PT LONG TERM GOAL #4   Title Patient ambulates >500' with cane or less & prosthesis and negotiates ramps/curbs/stairs single rail modified independent.    Time 12    Period Weeks    Status New    Target Date 08/30/20      PT LONG TERM GOAL #5   Title Patient verbalizes & demonstrates understanding of how to properly use fitness equipment to return to gym per her goal.    Time 12    Period Weeks    Status New    Target Date 08/30/20                 Plan - 07/23/20 0836    Clinical Impression Statement Worked to improve overall endurance, leg strength,gait and balance today. We also worked in more resistance machines that she can do at fitness center. She did not have any reports of pain but does stilll fatigue easily and needs rest breaks. We will work to improve this as she can tolerate.    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, Thoracotomy with lobectomy 02/24/2018,    Examination-Activity Limitations Locomotion  Level;Lift;Squat;Stairs;Stand;Transfers    Examination-Participation Restrictions Community Activity    Stability/Clinical Decision Making Evolving/Moderate complexity    Rehab Potential Good    PT Frequency 2x / week    PT Duration 12 weeks    PT Treatment/Interventions ADLs/Self Care Home Management;DME Instruction;Gait training;Stair training;Functional mobility training;Therapeutic activities;Therapeutic exercise;Balance training;Neuromuscular re-education;Patient/family education;Prosthetic Training    PT Next Visit Plan work towards updated STGs, review prosthetic care, introduce how to use fitness center equipment with prosthesis  check & update HEP for balance & standing strength,  prosthetic gait with cane    Consulted and Agree with Plan of Care Patient           Patient will benefit from skilled therapeutic intervention in order to improve the following deficits and impairments:  Abnormal gait,Decreased activity tolerance,Decreased balance,Decreased endurance,Decreased knowledge of use of DME,Decreased mobility,Decreased range of motion,Increased edema,Postural dysfunction,Prosthetic Dependency  Visit Diagnosis: Other abnormalities of gait and mobility  Unsteadiness on feet  Muscle weakness (generalized)  Abnormal posture     Problem List Patient Active Problem List   Diagnosis Date Noted  . Uncontrolled type 2 diabetes mellitus with hyperglycemia, with long-term current use of insulin ([place]) 08/27/2017  . Hyperlipidemia associated with type 2 diabetes mellitus ([place]) 03/18/2017  . Chronic renal disease, stage V ([place]) 08/20/2015  . Personal history of noncompliance with medical treatment, presenting hazards to health 08/20/2015  . Proteinuria 07/04/2014  . Uncontrolled type 2 diabetes mellitus with ESRD (end-stage renal disease) ([place]) 05/21/2014  . Hyperlipidemia 05/21/2014  . HTN (hypertension) 05/21/2014    Silvestre Mesi 07/23/2020, 8:40 AM  [facility] Physical Therapy [address] [place], [place], [address] Phone: [phone]   Fax:  [phone]  Name: Trudi Morgenthaler MRN: [id number] Date of Birth: January 26, 1951

## 2020-07-25 ENCOUNTER — Encounter: Payer: Self-pay | Admitting: Physical Therapy

## 2020-07-25 ENCOUNTER — Other Ambulatory Visit: Payer: Self-pay

## 2020-07-25 ENCOUNTER — Ambulatory Visit (INDEPENDENT_AMBULATORY_CARE_PROVIDER_SITE_OTHER): Payer: Medicare Other | Admitting: Physical Therapy

## 2020-07-25 DIAGNOSIS — R2681 Unsteadiness on feet: Secondary | ICD-10-CM

## 2020-07-25 DIAGNOSIS — R293 Abnormal posture: Secondary | ICD-10-CM | POA: Diagnosis not present

## 2020-07-25 DIAGNOSIS — M6281 Muscle weakness (generalized): Secondary | ICD-10-CM

## 2020-07-25 DIAGNOSIS — R2689 Other abnormalities of gait and mobility: Secondary | ICD-10-CM

## 2020-07-25 NOTE — Therapy (Signed)
[facility] Physical Therapy [address] [place], [place], [address] Phone: [phone]   Fax:  [phone]  Physical Therapy Treatment  Patient Details  Name: Shelley [name] MRN: [id number] Date of Birth: Oct 17, 1950 Referring Provider (PT): Sallee Lange, MD   Encounter Date: 07/25/2020   PT End of Session - 07/25/20 0837    Visit Number 12    Number of Visits 25    Date for PT Re-Evaluation 09/03/20    Authorization Type Medicare & BCBS supp    Progress Note Due on Visit 20    PT Start Time 0802    PT Stop Time 0845    PT Time Calculation (min) 43 min    Equipment Utilized During Treatment Gait belt    Activity Tolerance Patient tolerated treatment well    Behavior During Therapy [facility] for tasks assessed/performed           Past Medical History:  Diagnosis Date  . Diabetes ([place])   . Diabetes mellitus without complication ([place])   . End stage renal disease ([place])   . HTN (hypertension)   . Hyperlipidemia   . Hypertension     Past Surgical History:  Procedure Laterality Date  . ABDOMINAL HYSTERECTOMY     fibroids/complete hysterec 2000  . APPENDECTOMY    . CATARACT EXTRACTION Bilateral   . COLONOSCOPY N/A 03/12/2016   Procedure: COLONOSCOPY;  Surgeon: Daneil Dolin, MD;  [facility];  Service: Endoscopy;  Laterality: N/A;  9:30 am  . POLYPECTOMY  03/12/2016   Procedure: POLYPECTOMY;  Surgeon: Daneil Dolin, MD;  [facility];  Service: Endoscopy;;  colon  . YAG LASER APPLICATION Left 62/10/3149   Procedure: YAG LASER APPLICATION;  Surgeon: [place] Che, MD;  Location: [facility];  Service: Ophthalmology;  Laterality: Left;    There were no vitals filed for this visit.   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, Thoracotomy with lobectomy  02/24/2018,    Patient Stated Goals to be active like previously including working out (treadmill, [employer] training)                             [facility] Adult PT Treatment/Exercise - 07/25/20 0001      Transfers   Transfers Sit to Stand;Stand to Sit    Sit to Stand 6: Modified independent (Device/Increase time);With upper extremity assist;From chair/3-in-1    Stand to Sit 6: Modified independent (Device/Increase time);With upper extremity assist;To chair/3-in-1      Ambulation/Gait   Ambulation/Gait Yes    Ambulation/Gait Assistance 5: Supervision    Ambulation Distance (Feet) 250 Feet    Assistive device Prosthesis;Straight cane      Knee/Hip Exercises: Stretches   Active Hamstring Stretch Left;Right;3 reps;20 seconds    Active Hamstring Stretch Limitations supine knee extended with strap DF      Knee/Hip Exercises: Aerobic   Tread Mill 1.6pmh for 5 min with bilat UE support      Knee/Hip Exercises: Machines for Strengthening   Cybex Knee Extension 10# bilat 4X10    Cybex Knee Flexion 25#bilat 3X10    Cybex Leg Press shuttle leg press 112# 15 reps 3 sets      Knee/Hip Exercises: Standing   Other Standing Knee Exercises step ups  onto airex pad X10 with Rt leg no UE support, then X10 with left leg with one UE support on Rt      Knee/Hip Exercises: Seated   Sit to Sand 15 reps;without UE support                    PT Short Term Goals - 07/10/20 0756      PT SHORT TERM GOAL #1   Title Patient verbalizes proper sweat management & signs of sweat inside liner.    Time 4    Period Weeks    Status New    Target Date 08/01/20      PT SHORT TERM GOAL #2   Title Berg balace test 40/56    Time 4    Period Weeks    Status New    Target Date 08/01/20      PT SHORT TERM GOAL #3   Title Patient ambulates 400' with cane & prosthesis with supervision scanning environment.    Time 4    Period Weeks    Status Revised    Target Date 08/01/20      PT  SHORT TERM GOAL #4   Title Patient negotiates ramps & curbs with prosthesis & cane with supervision.    Time 4    Period Weeks    Status Revised    Target Date 08/01/20             PT Long Term Goals - 06/05/20 1235      PT LONG TERM GOAL #1   Title Patient tolerates prosthesis wear >90% of awake hours without skin or limb pain issues and verbalizes proper prosthetic care.    Time 12    Period Weeks    Status New    Target Date 08/30/20      PT LONG TERM GOAL #2   Title Berg Balance >45/56    Time 12    Period Weeks    Status New    Target Date 08/30/20      PT LONG TERM GOAL #3   Title Dynamic Gait Index with cane or less & prosthesis >/= 14 / 24    Time 12    Period Weeks    Status New    Target Date 08/30/20      PT LONG TERM GOAL #4   Title Patient ambulates >500' with cane or less & prosthesis and negotiates ramps/curbs/stairs single rail modified independent.    Time 12    Period Weeks    Status New    Target Date 08/30/20      PT LONG TERM GOAL #5   Title Patient verbalizes & demonstrates understanding of how to properly use fitness equipment to return to gym per her goal.    Time 12    Period Weeks    Status New    Target Date 08/30/20                 Plan - 07/25/20 0840    Clinical Impression Statement Worked to progress overall strength and conditioning today as she can tolerate with rest breaks as needed. PT encouraged her to try to perfrom more activity outside her home and to try to walk more when able. 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, Thoracotomy with lobectomy 02/24/2018,    Examination-Activity Limitations Locomotion  Level;Lift;Squat;Stairs;Stand;Transfers    Examination-Participation Restrictions Community Activity    Stability/Clinical Decision Making  Evolving/Moderate complexity    Rehab Potential Good    PT Frequency 2x / week    PT Duration 12 weeks    PT Treatment/Interventions ADLs/Self Care Home Management;DME Instruction;Gait training;Stair training;Functional mobility training;Therapeutic activities;Therapeutic exercise;Balance training;Neuromuscular re-education;Patient/family education;Prosthetic Training    PT Next Visit Plan work towards updated STGs, review prosthetic care, introduce how to use fitness center equipment with prosthesis  check & update HEP for balance & standing strength,  prosthetic gait with cane    Consulted and Agree with Plan of Care Patient           Patient will benefit from skilled therapeutic intervention in order to improve the following deficits and impairments:  Abnormal gait,Decreased activity tolerance,Decreased balance,Decreased endurance,Decreased knowledge of use of DME,Decreased mobility,Decreased range of motion,Increased edema,Postural dysfunction,Prosthetic Dependency  Visit Diagnosis: Other abnormalities of gait and mobility  Unsteadiness on feet  Muscle weakness (generalized)  Abnormal posture     Problem List Patient Active Problem List   Diagnosis Date Noted  . Uncontrolled type 2 diabetes mellitus with hyperglycemia, with long-term current use of insulin ([place]) 08/27/2017  . Hyperlipidemia associated with type 2 diabetes mellitus ([place]) 03/18/2017  . Chronic renal disease, stage V ([place]) 08/20/2015  . Personal history of noncompliance with medical treatment, presenting hazards to health 08/20/2015  . Proteinuria 07/04/2014  . Uncontrolled type 2 diabetes mellitus with ESRD (end-stage renal disease) ([place]) 05/21/2014  . Hyperlipidemia 05/21/2014  . HTN (hypertension) 05/21/2014    Silvestre Mesi 07/25/2020, 8:44 AM  [facility] Physical Therapy [address] [place], [place], [address] Phone: [phone]   Fax:  [phone]  Name: Ambria Mayfield MRN: [id number] Date of Birth: [birth year]

## 2020-07-30 ENCOUNTER — Other Ambulatory Visit: Payer: Self-pay

## 2020-07-30 ENCOUNTER — Ambulatory Visit (INDEPENDENT_AMBULATORY_CARE_PROVIDER_SITE_OTHER): Payer: Medicare Other | Admitting: Physical Therapy

## 2020-07-30 ENCOUNTER — Encounter: Payer: Self-pay | Admitting: Physical Therapy

## 2020-07-30 DIAGNOSIS — R2689 Other abnormalities of gait and mobility: Secondary | ICD-10-CM | POA: Diagnosis not present

## 2020-07-30 DIAGNOSIS — R2681 Unsteadiness on feet: Secondary | ICD-10-CM

## 2020-07-30 DIAGNOSIS — M6281 Muscle weakness (generalized): Secondary | ICD-10-CM | POA: Diagnosis not present

## 2020-07-30 DIAGNOSIS — R293 Abnormal posture: Secondary | ICD-10-CM | POA: Diagnosis not present

## 2020-07-30 NOTE — Therapy (Signed)
[facility] Physical Therapy [address] [place], [place], [address] Phone: [phone]   Fax:  [phone]  Physical Therapy Treatment  Patient Details  Name: Shelley [name] MRN: [id number] Date of Birth: 1950/11/10 Referring Provider (PT): Sallee Lange, MD   Encounter Date: 07/30/2020   PT End of Session - 07/30/20 0807    Visit Number 13    Number of Visits 25    Date for PT Re-Evaluation 09/03/20    Authorization Type Medicare & BCBS supp    Progress Note Due on Visit 20    PT Start Time 0800    PT Stop Time 0843    PT Time Calculation (min) 43 min    Equipment Utilized During Treatment Gait belt    Activity Tolerance Patient tolerated treatment well    Behavior During Therapy [facility] for tasks assessed/performed           Past Medical History:  Diagnosis Date  . Diabetes ([place])   . Diabetes mellitus without complication ([place])   . End stage renal disease ([place])   . HTN (hypertension)   . Hyperlipidemia   . Hypertension     Past Surgical History:  Procedure Laterality Date  . ABDOMINAL HYSTERECTOMY     fibroids/complete hysterec 2000  . APPENDECTOMY    . CATARACT EXTRACTION Bilateral   . COLONOSCOPY N/A 03/12/2016   Procedure: COLONOSCOPY;  Surgeon: Daneil Dolin, MD;  [facility];  Service: Endoscopy;  Laterality: N/A;  9:30 am  . POLYPECTOMY  03/12/2016   Procedure: POLYPECTOMY;  Surgeon: Daneil Dolin, MD;  [facility];  Service: Endoscopy;;  colon  . YAG LASER APPLICATION Left 25/05/6642   Procedure: YAG LASER APPLICATION;  Surgeon: [place] Che, MD;  Location: [facility];  Service: Ophthalmology;  Laterality: Left;    There were no vitals filed for this visit.   Subjective Assessment - 07/30/20 0800    Subjective The prosthetist put pads in socket & realignment which improved prosthetic function.    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 No/denies                             [facility] Adult PT Treatment/Exercise - 07/30/20 0800      Transfers   Transfers Sit to Stand;Stand to Sit    Sit to Stand 6: Modified independent (Device/Increase time);With upper extremity assist;From chair/3-in-1    Stand to Sit 6: Modified independent (Device/Increase time);With upper extremity assist;To chair/3-in-1      Ambulation/Gait   Ambulation/Gait Yes    Ambulation/Gait Assistance 5: Supervision    Ambulation Distance (Feet) 250 Feet    Assistive device Prosthesis;Straight cane    Ramp 5: Supervision   Hurry cane & TTA prosthesis   Ramp Details (indicate cue type and reason) worked on carryover of treadmill incline to ramp.    Curb 5: Supervision   Hurry cane & TTA prosthesis   Curb Details (indicate cue type and reason) cues on technique including maintaining motion for momentum.      High Level Balance   High Level Balance Activities Side stepping;Backward walking;Negotiating over obstacles    High Level Balance Comments PT demo & verbal cues on technique with prosthesis.  Self-Care   Lifting --      Exercises   Exercises Knee/Hip      Knee/Hip Exercises: Stretches   Active Hamstring Stretch Left;Right;20 seconds;2 reps    Active Hamstring Stretch Limitations supine knee extended with strap DF      Knee/Hip Exercises: Aerobic   Tread Mill 2.0pmh for 35min 1 set with 2 bouts of 30sec at 2.46mph with BUE support, cues to take quicker & longer stride to increase pace.    Other Aerobic treadmill to work on inclines 1.76mph for 2 min uphill & 2 min downhilll grade 4.0-7.0%      Knee/Hip Exercises: Machines for Strengthening   Cybex Leg Press shuttle leg press 118# 15 reps 2 sets 1st set back 45* & 2nd set back flat, lifting RLE off plate with LLE ext hold for stance control.       Prosthetics   Prosthetic Care Comments  --    Current prosthetic wear tolerance (days/week)  daily    Current prosthetic wear tolerance (#hours/day)  most of awake hours    Current prosthetic weight-bearing tolerance (hours/day)  Pt tolerated standing for 8 min without limb pain but right hip gets sore.  PT instructed in standing with equal wt on LEs    Edema pitting with 5 sec capillary refill    Residual limb condition  pt reports no issues.    Education Provided --                    PT Short Term Goals - 07/30/20 1257      PT SHORT TERM GOAL #1   Title Patient verbalizes proper sweat management & signs of sweat inside liner.    Time 4    Period Weeks    Status Achieved    Target Date 08/01/20      PT SHORT TERM GOAL #2   Title Berg balace test 40/56    Time 4    Period Weeks    Status On-going    Target Date 08/01/20      PT SHORT TERM GOAL #3   Title Patient ambulates 400' with cane & prosthesis with supervision scanning environment.    Time 4    Period Weeks    Status Achieved    Target Date 08/01/20      PT SHORT TERM GOAL #4   Title Patient negotiates ramps & curbs with prosthesis & cane with supervision.    Time 4    Period Weeks    Status Achieved    Target Date 08/01/20             PT Long Term Goals - 06/05/20 1235      PT LONG TERM GOAL #1   Title Patient tolerates prosthesis wear >90% of awake hours without skin or limb pain issues and verbalizes proper prosthetic care.    Time 12    Period Weeks    Status New    Target Date 08/30/20      PT LONG TERM GOAL #2   Title Berg Balance >45/56    Time 12    Period Weeks    Status New    Target Date 08/30/20      PT LONG TERM GOAL #3   Title Dynamic Gait Index with cane or less & prosthesis >/= 14 / 24    Time 12    Period Weeks    Status New    Target Date 08/30/20  PT LONG TERM GOAL #4   Title Patient ambulates >500' with cane or less & prosthesis and negotiates  ramps/curbs/stairs single rail modified independent.    Time 12    Period Weeks    Status New    Target Date 08/30/20      PT LONG TERM GOAL #5   Title Patient verbalizes & demonstrates understanding of how to properly use fitness equipment to return to gym per her goal.    Time 12    Period Weeks    Status New    Target Date 08/30/20                 Plan - 07/30/20 0807    Clinical Impression Statement PT used treadmill to work on endurance, increasing pace and inclines with overground carryover afterwards. She appears to have improved those skills.  PT instructed & worked on stepping over obstacles.    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, Thoracotomy with lobectomy 02/24/2018,    Examination-Activity Limitations Locomotion Level;Lift;Squat;Stairs;Stand;Transfers    Examination-Participation Restrictions Community Activity    Stability/Clinical Decision Making Evolving/Moderate complexity    Rehab Potential Good    PT Frequency 2x / week    PT Duration 12 weeks    PT Treatment/Interventions ADLs/Self Care Home Management;DME Instruction;Gait training;Stair training;Functional mobility training;Therapeutic activities;Therapeutic exercise;Balance training;Neuromuscular re-education;Patient/family education;Prosthetic Training    PT Next Visit Plan Assess Darryl [place], review prosthetic care, introduce how to use fitness center equipment with prosthesis  check & update HEP for balance & standing strength,  prosthetic gait with cane    Consulted and Agree with Plan of Care Patient           Patient will benefit from skilled therapeutic intervention in order to improve the following deficits and impairments:  Abnormal gait,Decreased activity tolerance,Decreased balance,Decreased endurance,Decreased knowledge of use of DME,Decreased  mobility,Decreased range of motion,Increased edema,Postural dysfunction,Prosthetic Dependency  Visit Diagnosis: Other abnormalities of gait and mobility  Unsteadiness on feet  Muscle weakness (generalized)  Abnormal posture     Problem List Patient Active Problem List   Diagnosis Date Noted  . Uncontrolled type 2 diabetes mellitus with hyperglycemia, with long-term current use of insulin ([place]) 08/27/2017  . Hyperlipidemia associated with type 2 diabetes mellitus ([place]) 03/18/2017  . Chronic renal disease, stage V ([place]) 08/20/2015  . Personal history of noncompliance with medical treatment, presenting hazards to health 08/20/2015  . Proteinuria 07/04/2014  . Uncontrolled type 2 diabetes mellitus with ESRD (end-stage renal disease) ([place]) 05/21/2014  . Hyperlipidemia 05/21/2014  . HTN (hypertension) 05/21/2014    Jamey Reas, PT, DPT 07/30/2020, 1:00 PM  [facility] Physical Therapy [address] [place], [place], [address] Phone: [phone]   Fax:  [phone]  Name: Shelley [name] MRN: [id number] Date of Birth: 02-08-1951

## 2020-08-01 ENCOUNTER — Encounter: Payer: Self-pay | Admitting: Physical Therapy

## 2020-08-01 ENCOUNTER — Ambulatory Visit (INDEPENDENT_AMBULATORY_CARE_PROVIDER_SITE_OTHER): Payer: Medicare Other | Admitting: Physical Therapy

## 2020-08-01 ENCOUNTER — Other Ambulatory Visit: Payer: Self-pay

## 2020-08-01 DIAGNOSIS — R2681 Unsteadiness on feet: Secondary | ICD-10-CM

## 2020-08-01 DIAGNOSIS — R293 Abnormal posture: Secondary | ICD-10-CM | POA: Diagnosis not present

## 2020-08-01 DIAGNOSIS — R2689 Other abnormalities of gait and mobility: Secondary | ICD-10-CM

## 2020-08-01 DIAGNOSIS — M6281 Muscle weakness (generalized): Secondary | ICD-10-CM | POA: Diagnosis not present

## 2020-08-01 NOTE — Therapy (Signed)
[facility] Physical Therapy [address] [place], [place], [address] Phone: [phone]   Fax:  [phone]  Physical Therapy Treatment  Patient Details  Name: Shelley [name] MRN: [id number] Date of Birth: 08/14/1950 Referring Provider (PT): Sallee Lange, MD   Encounter Date: 08/01/2020   PT End of Session - 08/01/20 0757    Visit Number 14    Number of Visits 25    Date for PT Re-Evaluation 09/03/20    Authorization Type Medicare & BCBS supp    Progress Note Due on Visit 20    PT Start Time 0758    PT Stop Time 0843    PT Time Calculation (min) 45 min    Equipment Utilized During Treatment Gait belt    Activity Tolerance Patient tolerated treatment well    Behavior During Therapy [facility] for tasks assessed/performed           Past Medical History:  Diagnosis Date  . Diabetes ([place])   . Diabetes mellitus without complication ([place])   . End stage renal disease ([place])   . HTN (hypertension)   . Hyperlipidemia   . Hypertension     Past Surgical History:  Procedure Laterality Date  . ABDOMINAL HYSTERECTOMY     fibroids/complete hysterec 2000  . APPENDECTOMY    . CATARACT EXTRACTION Bilateral   . COLONOSCOPY N/A 03/12/2016   Procedure: COLONOSCOPY;  Surgeon: Daneil Dolin, MD;  [facility];  Service: Endoscopy;  Laterality: N/A;  9:30 am  . POLYPECTOMY  03/12/2016   Procedure: POLYPECTOMY;  Surgeon: Daneil Dolin, MD;  Location: [facility];  Service: Endoscopy;;  colon  . YAG LASER APPLICATION Left 35/05/2990   Procedure: YAG LASER APPLICATION;  Surgeon: Williams Che, MD;  Location: [facility] ORS;  Service: Ophthalmology;  Laterality: Left;    There were no vitals filed for this visit.   Subjective Assessment - 08/01/20 0757    Subjective No issues with prosthesis.  She is starting to do more things herself now like she was able to take her own car in for an oil change. She is happy to be able to be more mobile in community.    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 No/denies              [facility] PT Assessment - 08/01/20 0758      Assessment   Medical Diagnosis Left Transtibial Amputation    Referring Provider (PT) Sallee Lange, MD      Berg Balance Test   Sit to Stand Able to stand  independently using hands    Standing Unsupported Able to stand safely 2 minutes    Sitting with Back Unsupported but Feet Supported on Floor or Stool Able to sit safely and securely 2 minutes    Stand to Sit Sits safely with minimal use of hands    Transfers Able to transfer safely, minor use of hands    Standing Unsupported with Eyes Closed Able to stand 10 seconds safely    Standing Unsupported with Feet Together Able to place feet together independently and stand 1 minute safely    From Standing, Reach Forward with Outstretched Arm Can reach confidently >25 cm (10")    From Standing Position, Pick up Object from Floor Able to pick up  shoe safely and easily    From Standing Position, Turn to Look Behind Over each Shoulder Looks behind one side only/other side shows less weight shift    Turn 360 Degrees Able to turn 360 degrees safely but slowly    Standing Unsupported, Alternately Place Feet on Step/Stool Able to complete 4 steps without aid or supervision    Standing Unsupported, One Foot in Front Able to plae foot ahead of the other independently and hold 30 seconds    Standing on One Leg Tries to lift leg/unable to hold 3 seconds but remains standing independently    Total Score 46    Berg comment: initially was 35/56                         [facility] Adult PT Treatment/Exercise - 08/01/20 0758      Transfers   Transfers Sit to Stand;Stand to Sit    Sit to Stand 6: Modified independent (Device/Increase time);With  upper extremity assist;From chair/3-in-1    Stand to Sit 6: Modified independent (Device/Increase time);With upper extremity assist;To chair/3-in-1      Ambulation/Gait   Ambulation/Gait Yes    Ambulation/Gait Assistance 5: Supervision    Ambulation Distance (Feet) 250 Feet    Assistive device Prosthesis;Straight cane    Ramp 5: Supervision   Hurry cane & TTA prosthesis   Curb 5: Supervision   Hurry cane & TTA prosthesis     High Level Balance   High Level Balance Activities Side stepping;Backward walking;Negotiating over obstacles;Other (comment)   increasing pace & walking eyes closed   High Level Balance Comments PT verbal & tactile cues on technique with prosthesis.      Exercises   Exercises Knee/Hip      Knee/Hip Exercises: Stretches   Active Hamstring Stretch Left;Right;20 seconds;2 reps    Active Hamstring Stretch Limitations supine knee extended with strap DF      Knee/Hip Exercises: Aerobic   Tread Mill 2.0pmh for 47min 1 set with 2 bouts of 30sec at 2.80mph with BUE support, cues to take quicker & longer stride to increase pace.    Other Aerobic treadmill to work on inclines 1.26mph for 2 min uphill & 2 min downhilll grade 4.0-7.0%      Knee/Hip Exercises: Machines for Strengthening   Cybex Leg Press shuttle leg press 118# 15 reps 2 sets 1st set back 45* & 2nd set back flat, lifting RLE off plate with LLE ext hold for stance control.      Prosthetics   Current prosthetic wear tolerance (days/week)  daily    Current prosthetic wear tolerance (#hours/day)  most of awake hours    Current prosthetic weight-bearing tolerance (hours/day)  Pt tolerated standing for 8 min without limb pain but right hip gets sore.  PT instructed in standing with equal wt on LEs    Edema pitting with 5 sec capillary refill    Residual limb condition  pt reports no issues.                  PT Education - 08/01/20 0901    Education Details components of fitness plan & progressive walking  program with treadmill  - see pt instructions    Person(s) Educated Patient    Methods Explanation;Verbal cues;Handout    Comprehension Verbalized understanding;Verbal cues required            PT Short Term Goals - 08/01/20 0927      PT SHORT  TERM GOAL #1   Title Patient verbalizes proper sweat management & signs of sweat inside liner.    Time 4    Period Weeks    Status Achieved    Target Date 08/01/20      PT SHORT TERM GOAL #2   Title Berg balace test 40/56    Time 4    Period Weeks    Status Achieved    Target Date 08/01/20      PT SHORT TERM GOAL #3   Title Patient ambulates 400' with cane & prosthesis with supervision scanning environment.    Time 4    Period Weeks    Status Achieved    Target Date 08/01/20      PT SHORT TERM GOAL #4   Title Patient negotiates ramps & curbs with prosthesis & cane with supervision.    Time 4    Period Weeks    Status Achieved    Target Date 08/01/20             PT Long Term Goals - 08/01/20 0927      PT LONG TERM GOAL #1   Title Patient tolerates prosthesis wear >90% of awake hours without skin or limb pain issues and verbalizes proper prosthetic care.    Time 12    Period Weeks    Status On-going    Target Date 08/30/20      PT LONG TERM GOAL #2   Title Berg Balance >/=  52/56    Time 12    Period Weeks    Status Revised    Target Date 08/30/20      PT LONG TERM GOAL #3   Title Dynamic Gait Index with cane or less & prosthesis >/= 14 / 24    Time 12    Period Weeks    Status On-going    Target Date 08/30/20      PT LONG TERM GOAL #4   Title Patient ambulates >500' with cane or less & prosthesis and negotiates ramps/curbs/stairs single rail modified independent.    Time 12    Period Weeks    Status On-going    Target Date 08/30/20      PT LONG TERM GOAL #5   Title Patient verbalizes & demonstrates understanding of how to properly use fitness equipment to return to gym per her goal.    Time 12    Period  Weeks    Status On-going    Target Date 08/30/20                 Plan - 08/01/20 0757    Clinical Impression Statement Patient's balance has improved with more equality of weight bearing as noted by Merrilee Jansky Balance to 46/56 from 35/56 initially.  PT instructed in fitness plan & treadmill program which she appears to understand.    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, Thoracotomy with lobectomy 02/24/2018,    Examination-Activity Limitations Locomotion Level;Lift;Squat;Stairs;Stand;Transfers    Examination-Participation Restrictions Community Activity    Stability/Clinical Decision Making Evolving/Moderate complexity    Rehab Potential Good    PT Frequency 2x / week    PT Duration 12 weeks    PT Treatment/Interventions ADLs/Self Care Home Management;DME Instruction;Gait training;Stair training;Functional mobility training;Therapeutic activities;Therapeutic exercise;Balance training;Neuromuscular re-education;Patient/family education;Prosthetic Training    PT Next Visit Plan review prosthetic care, introduce how to use fitness center equipment  with prosthesis  prosthetic gait with& without cane    Consulted and Agree with Plan of Care Patient           Patient will benefit from skilled therapeutic intervention in order to improve the following deficits and impairments:  Abnormal gait,Decreased activity tolerance,Decreased balance,Decreased endurance,Decreased knowledge of use of DME,Decreased mobility,Decreased range of motion,Increased edema,Postural dysfunction,Prosthetic Dependency  Visit Diagnosis: Other abnormalities of gait and mobility  Unsteadiness on feet  Muscle weakness (generalized)  Abnormal posture     Problem List Patient Active Problem List   Diagnosis Date Noted  . Uncontrolled type 2 diabetes mellitus with  hyperglycemia, with long-term current use of insulin ([place]) 08/27/2017  . Hyperlipidemia associated with type 2 diabetes mellitus ([place]) 03/18/2017  . Chronic renal disease, stage V ([place]) 08/20/2015  . Personal history of noncompliance with medical treatment, presenting hazards to health 08/20/2015  . Proteinuria 07/04/2014  . Uncontrolled type 2 diabetes mellitus with ESRD (end-stage renal disease) ([place]) 05/21/2014  . Hyperlipidemia 05/21/2014  . HTN (hypertension) 05/21/2014    Jamey Reas, PT, DPT 08/01/2020, 10:12 AM  [facility] Physical Therapy [address] [place], [place], [address] Phone: [phone]   Fax:  [phone]  Name: Shelley [name] MRN: [id number] Date of Birth: 30-May-1950

## 2020-08-01 NOTE — Patient Instructions (Signed)
Treadmill workout 1. Walk 3 min rest 3 min for 5 sets 2. Walk 4 min rest 3 min for 5 sets 3. Walk 5 min rest 3 min for 4 sets 4. Progressively decrease rest periods until can walk 20 min without rest  This will take weeks.    Fitness Plan has 4 components.  1. Endurance - Goal is 20-30 minutes. You can break time up between machines. You can do sets with rest between sets if need. Example 5 minutes work, 2 minutes rest for 3 sets. Recommend machines that are sitting with back support that uses both your arms and your legs at the same time. Or can do walking program & can use assistive device if increases time & quality of your walking.  Treadmill safety- step on & off machine with foot on solid portion not treadmill belt. Use safety lead so belt will stop if you get in trouble. Straddle belt. Turn treadmill on & set to desired speed while you are still straddling the belt. Step on & off belt leading with your good leg. Step off the belt first to adjust speed or stop.  2. Strength - Goal is form & control. Weight machines should have enough weight to have resistance but not so much that you have to strain or cheat. If you have to cheat (poor form), strain or the weight stack hits hard / out of control, then you probably have too much weight.   Goal is to do 15 repetitions for 1-3 sets. Start with 1 set and build up. Rest 30-60 seconds between sets.  Do 2-4 leg machines, 2-4 arm machines & 2-4 trunk machines. Build up to 4-6 leg machines & 4-6 arm machines. Look for pictures to make sure you exercise both sides of arm, leg or trunk. You want to balance out muscle groups that you working.   Leg machines like leg press (can do both legs or each leg by themselves),   At home can do theraband exercises for arms or legs, floor transfers, sit to stand to sit using arms as little as possible, Yoga positions 3.Flexibility - make sure you include arms, legs & trunk. Can do Yoga also 4. Balance- can work in  corner with chair in front - head turns, arm motions, eyes closed; place one foot inside cabinet or on 4-6" block to work on one-legged stance,   Recommendations are at least 150 minutes a week of moderate intensity exercise. Try not to go more than 2 days in a row without being active for at least 30 minutes a day. Any activity where you are up and moving is good- Walking, bicycling, stationary bicycling, dancing.  (moderate intensity means to get  a little out of breath). To start you may not tolerate moderate but if it is challenging to you then it is helping.    Do resistance exercise at least 2 times a week.  This can be yoga poses or strength training where you lift your own weight (think leg lifts or toe raises)  or light weights like cans of beans with your arms.    Flexibility and balance exercise- safe stretching and practicing balance is very important to health.

## 2020-08-06 ENCOUNTER — Ambulatory Visit (INDEPENDENT_AMBULATORY_CARE_PROVIDER_SITE_OTHER): Payer: Medicare Other | Admitting: Physical Therapy

## 2020-08-06 ENCOUNTER — Encounter: Payer: Self-pay | Admitting: Physical Therapy

## 2020-08-06 ENCOUNTER — Other Ambulatory Visit: Payer: Self-pay

## 2020-08-06 DIAGNOSIS — R2689 Other abnormalities of gait and mobility: Secondary | ICD-10-CM | POA: Diagnosis not present

## 2020-08-06 DIAGNOSIS — R2681 Unsteadiness on feet: Secondary | ICD-10-CM

## 2020-08-06 DIAGNOSIS — R293 Abnormal posture: Secondary | ICD-10-CM | POA: Diagnosis not present

## 2020-08-06 DIAGNOSIS — M6281 Muscle weakness (generalized): Secondary | ICD-10-CM | POA: Diagnosis not present

## 2020-08-06 NOTE — Therapy (Signed)
[facility] Physical Therapy [address] [place], [place], [address] Phone: [phone]   Fax:  [phone]  Physical Therapy Treatment  Patient Details  Name: Shelley [name] MRN: [id number] Date of Birth: 30-Jul-1950 Referring Provider (PT): Sallee Lange, MD   Encounter Date: 08/06/2020   PT End of Session - 08/06/20 0755    Visit Number 15    Number of Visits 25    Date for PT Re-Evaluation 09/03/20    Authorization Type Medicare & BCBS supp    Progress Note Due on Visit 20    PT Start Time 0800    PT Stop Time 0845    PT Time Calculation (min) 45 min    Equipment Utilized During Treatment Gait belt    Activity Tolerance Patient tolerated treatment well    Behavior During Therapy [facility] for tasks assessed/performed           Past Medical History:  Diagnosis Date  . Diabetes ([place])   . Diabetes mellitus without complication ([place])   . End stage renal disease ([place])   . HTN (hypertension)   . Hyperlipidemia   . Hypertension     Past Surgical History:  Procedure Laterality Date  . ABDOMINAL HYSTERECTOMY     fibroids/complete hysterec 2000  . APPENDECTOMY    . CATARACT EXTRACTION Bilateral   . COLONOSCOPY N/A 03/12/2016   Procedure: COLONOSCOPY;  Surgeon: Daneil Dolin, MD;  [facility];  Service: Endoscopy;  Laterality: N/A;  9:30 am  . POLYPECTOMY  03/12/2016   Procedure: POLYPECTOMY;  Surgeon: Daneil Dolin, MD;  [facility];  Service: Endoscopy;;  colon  . YAG LASER APPLICATION Left 45/10/996   Procedure: YAG LASER APPLICATION;  Surgeon: Williams Che, MD;  Location: [facility];  Service: Ophthalmology;  Laterality: Left;    There were no vitals filed for this visit.   Subjective Assessment - 08/06/20 0756    Subjective She saw foot doctor yesterday & got good report. She has appt with vascular doctor in couple of weeks.    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 No/denies                             [facility] Adult PT Treatment/Exercise - 08/06/20 0756      Transfers   Transfers Sit to Stand;Stand to Sit    Sit to Stand 6: Modified independent (Device/Increase time);With upper extremity assist;From chair/3-in-1    Stand to Sit 6: Modified independent (Device/Increase time);With upper extremity assist;To chair/3-in-1      Ambulation/Gait   Ambulation/Gait Yes    Ambulation/Gait Assistance 5: Supervision    Ambulation Distance (Feet) 250 Feet    Assistive device Prosthesis;Straight cane    Ramp 5: Supervision;3: Mod assist   supervision with Hurry cane & TTA prosthesis, modA without device   Ramp Details (indicate cue type and reason) tactile & verbal cues on technique    Curb 5: Supervision   supervision with Hurry cane & TTA prosthesis, modA without device   Curb Details (indicate cue type and reason) tactile & verbal cues on technique      High Level Balance   High Level Balance Activities Side stepping;Backward walking;Negotiating over obstacles;Other (comment)   increasing pace &  walking eyes closed   High Level Balance Comments PT verbal & tactile cues on technique with prosthesis.      Exercises   Exercises Knee/Hip      Knee/Hip Exercises: Stretches   Active Hamstring Stretch Left;Right;20 seconds;2 reps    Active Hamstring Stretch Limitations supine knee extended with strap DF      Knee/Hip Exercises: Aerobic   Tread Mill 2.1pmh for 58min 1 set with 2 bouts of 30sec at 2.51mph with BUE support, cues to take quicker & longer stride to increase pace.    Other Aerobic treadmill to work on inclines 1.56mph for 2 min uphill & 2 min downhilll grade 4.0-7.0%      Knee/Hip Exercises: Machines for Strengthening   Cybex Leg Press shuttle leg press 125# 15 reps 2 sets 1st set  back 45* & 2nd set back flat, lifting RLE off plate with LLE ext hold for stance control.      Prosthetics   Prosthetic Care Comments  PT contacted prosthetic office to recommend increasing size of pretibial & popliteal pads to decrease rotation.  Pt has been using current socket >6 months and limb volume changes indicate need for socket revision.  Pt may benefit from suction suspension.  She will need a face to face with referring physician either inperson or virtually.    Current prosthetic wear tolerance (days/week)  daily    Current prosthetic wear tolerance (#hours/day)  most of awake hours    Current prosthetic weight-bearing tolerance (hours/day)  Pt tolerated standing for 8 min without limb pain but right hip gets sore.  PT instructed in standing with equal wt on LEs    Edema pitting with 5 sec capillary refill    Residual limb condition  pt reports no issues.    Education Provided Correct ply sock adjustment;Other (comment)   see prosthetic care comments   Person(s) Educated Patient    Education Method Explanation;Verbal cues    Education Method Verbalized understanding;Verbal cues required;Needs further instruction                    PT Short Term Goals - 08/01/20 0927      PT SHORT TERM GOAL #1   Title Patient verbalizes proper sweat management & signs of sweat inside liner.    Time 4    Period Weeks    Status Achieved    Target Date 08/01/20      PT SHORT TERM GOAL #2   Title Berg balace test 40/56    Time 4    Period Weeks    Status Achieved    Target Date 08/01/20      PT SHORT TERM GOAL #3   Title Patient ambulates 400' with cane & prosthesis with supervision scanning environment.    Time 4    Period Weeks    Status Achieved    Target Date 08/01/20      PT SHORT TERM GOAL #4   Title Patient negotiates ramps & curbs with prosthesis & cane with supervision.    Time 4    Period Weeks    Status Achieved    Target Date 08/01/20             PT  Long Term Goals - 08/01/20 0927      PT LONG TERM GOAL #1   Title Patient tolerates prosthesis wear >90% of awake hours without skin or limb pain issues and verbalizes proper prosthetic care.    Time 12  Period Weeks    Status On-going    Target Date 08/30/20      PT LONG TERM GOAL #2   Title Berg Balance >/=  52/56    Time 12    Period Weeks    Status Revised    Target Date 08/30/20      PT LONG TERM GOAL #3   Title Dynamic Gait Index with cane or less & prosthesis >/= 14 / 24    Time 12    Period Weeks    Status On-going    Target Date 08/30/20      PT LONG TERM GOAL #4   Title Patient ambulates >500' with cane or less & prosthesis and negotiates ramps/curbs/stairs single rail modified independent.    Time 12    Period Weeks    Status On-going    Target Date 08/30/20      PT LONG TERM GOAL #5   Title Patient verbalizes & demonstrates understanding of how to properly use fitness equipment to return to gym per her goal.    Time 12    Period Weeks    Status On-going    Target Date 08/30/20                 Plan - 08/06/20 0755    Clinical Impression Statement patient's prosthesis rotated during gait today increasing risk of knee instability or tripping. Prosthetist to increase size of pads in socket.  PT recommending starting process of socket revision with possibility of suction suspension.  She is on target to meet LTGs by end of cert period but may benefit from additional PT to reach a higher level of function.    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, Thoracotomy with lobectomy 02/24/2018,    Examination-Activity Limitations Locomotion Level;Lift;Squat;Stairs;Stand;Transfers    Examination-Participation Restrictions Community Activity    Stability/Clinical Decision Making Evolving/Moderate complexity    Rehab  Potential Good    PT Frequency 2x / week    PT Duration 12 weeks    PT Treatment/Interventions ADLs/Self Care Home Management;DME Instruction;Gait training;Stair training;Functional mobility training;Therapeutic activities;Therapeutic exercise;Balance training;Neuromuscular re-education;Patient/family education;Prosthetic Training    PT Next Visit Plan review prosthetic care, introduce how to use fitness center equipment with prosthesis  prosthetic gait with& without cane    Consulted and Agree with Plan of Care Patient           Patient will benefit from skilled therapeutic intervention in order to improve the following deficits and impairments:  Abnormal gait,Decreased activity tolerance,Decreased balance,Decreased endurance,Decreased knowledge of use of DME,Decreased mobility,Decreased range of motion,Increased edema,Postural dysfunction,Prosthetic Dependency  Visit Diagnosis: Other abnormalities of gait and mobility  Unsteadiness on feet  Muscle weakness (generalized)  Abnormal posture     Problem List Patient Active Problem List   Diagnosis Date Noted  . Uncontrolled type 2 diabetes mellitus with hyperglycemia, with long-term current use of insulin ([place]) 08/27/2017  . Hyperlipidemia associated with type 2 diabetes mellitus ([place]) 03/18/2017  . Chronic renal disease, stage V ([place]) 08/20/2015  . Personal history of noncompliance with medical treatment, presenting hazards to health 08/20/2015  . Proteinuria 07/04/2014  . Uncontrolled type 2 diabetes mellitus with ESRD (end-stage renal disease) ([place]) 05/21/2014  . Hyperlipidemia 05/21/2014  . HTN (hypertension) 05/21/2014    Jamey Reas, PT, DPT 08/06/2020, 1:04 PM  [facility] Physical Therapy [address] [place], [place], [address]  Phone: [phone]   Fax:  [phone]  Name: Shelley [name] MRN: [id number] Date of Birth: 1950-06-13

## 2020-08-08 ENCOUNTER — Other Ambulatory Visit: Payer: Self-pay

## 2020-08-08 ENCOUNTER — Encounter: Payer: Self-pay | Admitting: Physical Therapy

## 2020-08-08 ENCOUNTER — Ambulatory Visit (INDEPENDENT_AMBULATORY_CARE_PROVIDER_SITE_OTHER): Payer: Medicare Other | Admitting: Physical Therapy

## 2020-08-08 DIAGNOSIS — R293 Abnormal posture: Secondary | ICD-10-CM | POA: Diagnosis not present

## 2020-08-08 DIAGNOSIS — M6281 Muscle weakness (generalized): Secondary | ICD-10-CM | POA: Diagnosis not present

## 2020-08-08 DIAGNOSIS — R2689 Other abnormalities of gait and mobility: Secondary | ICD-10-CM | POA: Diagnosis not present

## 2020-08-08 DIAGNOSIS — R2681 Unsteadiness on feet: Secondary | ICD-10-CM

## 2020-08-08 NOTE — Therapy (Signed)
[facility] Physical Therapy [address] [place], [place], [address] Phone: [phone]   Fax:  [phone]  Physical Therapy Treatment  Patient Details  Name: Shelley [name] MRN: [id number] Date of Birth: Jan 04, 1951 Referring Provider (PT): Sallee Lange, MD   Encounter Date: 08/08/2020   PT End of Session - 08/08/20 0758    Visit Number 16    Number of Visits 25    Date for PT Re-Evaluation 09/03/20    Authorization Type Medicare & BCBS supp    Progress Note Due on Visit 20    PT Start Time 0759    PT Stop Time 0845    PT Time Calculation (min) 46 min    Equipment Utilized During Treatment Gait belt    Activity Tolerance Patient tolerated treatment well    Behavior During Therapy [facility] for tasks assessed/performed           Past Medical History:  Diagnosis Date  . Diabetes ([place])   . Diabetes mellitus without complication ([place])   . End stage renal disease ([place])   . HTN (hypertension)   . Hyperlipidemia   . Hypertension     Past Surgical History:  Procedure Laterality Date  . ABDOMINAL HYSTERECTOMY     fibroids/complete hysterec 2000  . APPENDECTOMY    . CATARACT EXTRACTION Bilateral   . COLONOSCOPY N/A 03/12/2016   Procedure: COLONOSCOPY;  Surgeon: Daneil Dolin, MD;  [facility];  Service: Endoscopy;  Laterality: N/A;  9:30 am  . POLYPECTOMY  03/12/2016   Procedure: POLYPECTOMY;  Surgeon: Daneil Dolin, MD;  [facility];  Service: Endoscopy;;  colon  . YAG LASER APPLICATION Left 22/04/9796   Procedure: YAG LASER APPLICATION;  Surgeon: Williams Che, MD;  Location: [facility];  Service: Ophthalmology;  Laterality: Left;    There were no vitals filed for this visit.   Subjective Assessment - 08/08/20 0759    Subjective Her limb is hurting with new pads. She couldn't get prosthesis on her limb until ~2 yesterday.    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 Yes    Pain Score 7     Pain Location Leg   residual limb   Pain Orientation Left   beside tibia & popleatial where pads are located.   Pain Descriptors / Indicators Sore;Squeezing    Pain Type Other (Comment)   prosthetic   Pain Onset In the past 7 days    Aggravating Factors  increased size of pads in prosthetic socket    Pain Relieving Factors taking prosthesis off                             [facility] Adult PT Treatment/Exercise - 08/08/20 0759      Transfers   Transfers Sit to Stand;Stand to Sit    Sit to Stand 6: Modified independent (Device/Increase time);With upper extremity assist;From chair/3-in-1    Stand to Sit 6: Modified independent (Device/Increase time);With upper extremity assist;To chair/3-in-1      Ambulation/Gait   Ambulation/Gait Yes    Ambulation/Gait Assistance 5: Supervision    Ambulation Distance (Feet) 250 Feet    Assistive device Prosthesis;Straight cane    Ramp 5: Supervision;3: Mod assist   supervision with Hurry cane &  TTA prosthesis, modA without device   Curb 5: Supervision   supervision with Hurry cane & TTA prosthesis, modA without device     High Level Balance   High Level Balance Activities Side stepping;Backward walking;Negotiating over obstacles;Other (comment)   increasing pace & walking eyes closed, turning 90* & 180*   High Level Balance Comments PT verbal & tactile cues on technique with prosthesis.      Exercises   Exercises Knee/Hip      Knee/Hip Exercises: Stretches   Active Hamstring Stretch Left;Right;20 seconds;3 reps    Active Hamstring Stretch Limitations supine knee extended with strap DF      Knee/Hip Exercises: Aerobic   Tread Mill --    Other Aerobic --      Knee/Hip Exercises: Machines for Strengthening   Cybex Leg Press shuttle leg press 125# 15 reps 2 sets  1st set back 45* & 2nd set back flat, lifting RLE off plate with LLE ext hold for stance control.      Prosthetics   Prosthetic Care Comments  Pretibial & popliteal pads are ~3-4 times the size of previous pads.  PT spoke to her prosthetist regarding size of pads that another prosthetist placed on his day off 2 days ago.  He set up appt tomorrow in his office at 9:30.  PT instructed in standing & lifting or stepping RLE to put weight on prosthesis to donne and fall risk if walks away from chair & prosthesis falls off. PT recommended taking shrinkers with her tomorrow to check that she is in proper size with current limb volume.  PT removed additional popliteal pad and pt went from 0-ply fit that was too many/high to 4-ply fit. She reported increased comfort.    Current prosthetic wear tolerance (days/week)  daily    Current prosthetic wear tolerance (#hours/day)  most of awake hours    Current prosthetic weight-bearing tolerance (hours/day)  Pt tolerated standing for 8 min without limb pain but right hip gets sore.  PT instructed in standing with equal wt on LEs    Edema pitting with 5 sec capillary refill    Residual limb condition  pt reports no issues.    Education Provided Other (comment);Proper Donning   see prosthetic care comments   Person(s) Educated Patient    Education Method Explanation;Demonstration;Tactile cues;Verbal cues    Education Method Verbalized understanding;Verbal cues required;Needs further instruction;Tactile cues required               Balance Exercises - 08/08/20 0759      Balance Exercises: Standing   Standing Eyes Opened Wide (BOA);Foam/compliant surface;5 reps;Head turns   standing crossways on foam beam   Standing Eyes Opened Limitations minA/ tactile cues for balance reactions, head turns 4 ways (right/left, up/down & diagonals)    Standing Eyes Closed Wide (BOA);Head turns;Foam/compliant surface;5 reps   standing crossways on balance beam   Standing Eyes  Closed Limitations minA/ tactile cues for balance reactions, head turns 4 ways (right/left, up/down & diagonals)               PT Short Term Goals - 08/01/20 0927      PT SHORT TERM GOAL #1   Title Patient verbalizes proper sweat management & signs of sweat inside liner.    Time 4    Period Weeks    Status Achieved    Target Date 08/01/20      PT SHORT TERM GOAL #2   Title Berg balace test [phone]  Time 4    Period Weeks    Status Achieved    Target Date 08/01/20      PT SHORT TERM GOAL #3   Title Patient ambulates 400' with cane & prosthesis with supervision scanning environment.    Time 4    Period Weeks    Status Achieved    Target Date 08/01/20      PT SHORT TERM GOAL #4   Title Patient negotiates ramps & curbs with prosthesis & cane with supervision.    Time 4    Period Weeks    Status Achieved    Target Date 08/01/20             PT Long Term Goals - 08/01/20 0927      PT LONG TERM GOAL #1   Title Patient tolerates prosthesis wear >90% of awake hours without skin or limb pain issues and verbalizes proper prosthetic care.    Time 12    Period Weeks    Status On-going    Target Date 08/30/20      PT LONG TERM GOAL #2   Title Berg Balance >/=  52/56    Time 12    Period Weeks    Status Revised    Target Date 08/30/20      PT LONG TERM GOAL #3   Title Dynamic Gait Index with cane or less & prosthesis >/= 14 / 24    Time 12    Period Weeks    Status On-going    Target Date 08/30/20      PT LONG TERM GOAL #4   Title Patient ambulates >500' with cane or less & prosthesis and negotiates ramps/curbs/stairs single rail modified independent.    Time 12    Period Weeks    Status On-going    Target Date 08/30/20      PT LONG TERM GOAL #5   Title Patient verbalizes & demonstrates understanding of how to properly use fitness equipment to return to gym per her goal.    Time 12    Period Weeks    Status On-going    Target Date 08/30/20                  Plan - 08/08/20 0759    Clinical Impression Statement Increased size of prosthetic pads seemed too much causing pain & difficulty donning.  PT was able to remove popliteal pad addition which improved both issues.  PT also set up appt with her primary prosthetist for tomorrow.  PT worked on balance issues.    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, Thoracotomy with lobectomy 02/24/2018,    Examination-Activity Limitations Locomotion Level;Lift;Squat;Stairs;Stand;Transfers    Examination-Participation Restrictions Community Activity    Stability/Clinical Decision Making Evolving/Moderate complexity    Rehab Potential Good    PT Frequency 2x / week    PT Duration 12 weeks    PT Treatment/Interventions ADLs/Self Care Home Management;DME Instruction;Gait training;Stair training;Functional mobility training;Therapeutic activities;Therapeutic exercise;Balance training;Neuromuscular re-education;Patient/family education;Prosthetic Training    PT Next Visit Plan review prosthetic care, introduce how to use fitness center equipment with prosthesis  prosthetic gait with& without cane    Consulted and Agree with Plan of Care Patient           Patient will benefit from skilled therapeutic intervention in order to improve the following deficits and impairments:  Abnormal gait,Decreased  activity tolerance,Decreased balance,Decreased endurance,Decreased knowledge of use of DME,Decreased mobility,Decreased range of motion,Increased edema,Postural dysfunction,Prosthetic Dependency  Visit Diagnosis: Unsteadiness on feet  Muscle weakness (generalized)  Other abnormalities of gait and mobility  Abnormal posture     Problem List Patient Active Problem List   Diagnosis Date Noted  . Uncontrolled type 2 diabetes mellitus with hyperglycemia,  with long-term current use of insulin ([place]) 08/27/2017  . Hyperlipidemia associated with type 2 diabetes mellitus ([place]) 03/18/2017  . Chronic renal disease, stage V ([place]) 08/20/2015  . Personal history of noncompliance with medical treatment, presenting hazards to health 08/20/2015  . Proteinuria 07/04/2014  . Uncontrolled type 2 diabetes mellitus with ESRD (end-stage renal disease) ([place]) 05/21/2014  . Hyperlipidemia 05/21/2014  . HTN (hypertension) 05/21/2014    Jamey Reas, PT, DPT 08/08/2020, 9:47 AM  [facility] Physical Therapy [address] [place], [place], [address] Phone: [phone]   Fax:  [phone]  Name: Aniaya Bacha MRN: [id number] Date of Birth: [birth year]

## 2020-08-13 ENCOUNTER — Ambulatory Visit (INDEPENDENT_AMBULATORY_CARE_PROVIDER_SITE_OTHER): Payer: Medicare Other | Admitting: Physical Therapy

## 2020-08-13 ENCOUNTER — Encounter: Payer: Self-pay | Admitting: Physical Therapy

## 2020-08-13 ENCOUNTER — Other Ambulatory Visit: Payer: Self-pay

## 2020-08-13 DIAGNOSIS — R2681 Unsteadiness on feet: Secondary | ICD-10-CM

## 2020-08-13 DIAGNOSIS — R293 Abnormal posture: Secondary | ICD-10-CM

## 2020-08-13 DIAGNOSIS — R2689 Other abnormalities of gait and mobility: Secondary | ICD-10-CM

## 2020-08-13 DIAGNOSIS — M6281 Muscle weakness (generalized): Secondary | ICD-10-CM

## 2020-08-13 NOTE — Therapy (Signed)
[facility] Physical Therapy [address] [place], [place], [address] Phone: [phone]   Fax:  [phone]  Physical Therapy Treatment  Patient Details  Name: Shelley [name] MRN: [id number] Date of Birth: [birth year] Referring Provider (PT): Sallee Lange, MD   Encounter Date: 08/13/2020   PT End of Session - 08/13/20 0807    Visit Number 17    Number of Visits 25    Date for PT Re-Evaluation 09/03/20    Authorization Type Medicare & BCBS supp    Progress Note Due on Visit 20    PT Start Time 0800    PT Stop Time 0841    PT Time Calculation (min) 41 min    Equipment Utilized During Treatment Gait belt    Activity Tolerance Patient tolerated treatment well    Behavior During Therapy [facility] for tasks assessed/performed           Past Medical History:  Diagnosis Date  . Diabetes ([place])   . Diabetes mellitus without complication ([place])   . End stage renal disease ([place])   . HTN (hypertension)   . Hyperlipidemia   . Hypertension     Past Surgical History:  Procedure Laterality Date  . ABDOMINAL HYSTERECTOMY     fibroids/complete hysterec 2000  . APPENDECTOMY    . CATARACT EXTRACTION Bilateral   . COLONOSCOPY N/A 03/12/2016   Procedure: COLONOSCOPY;  Surgeon: Daneil Dolin, MD;  [facility];  Service: Endoscopy;  Laterality: N/A;  9:30 am  . POLYPECTOMY  03/12/2016   Procedure: POLYPECTOMY;  Surgeon: Daneil Dolin, MD;  [facility];  Service: Endoscopy;;  colon  . YAG LASER APPLICATION Left 94/03/7406   Procedure: YAG LASER APPLICATION;  Surgeon: Williams Che, MD;  Location: [facility];  Service: Ophthalmology;  Laterality: Left;    There were no vitals filed for this visit.   Subjective Assessment - 08/13/20 0800    Subjective She saw her primary prosthetist who managed size of pads & alignment which feels a lot better & still controls socket rotating on limb.  He is going to set up virtual appt with Dr. Lynann Bologna about socket  revision.    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 No/denies    Pain Onset In the past 7 days                             [facility] Adult PT Treatment/Exercise - 08/13/20 0800      Transfers   Transfers Sit to Stand;Stand to Sit    Sit to Stand 6: Modified independent (Device/Increase time);With upper extremity assist;From chair/3-in-1    Stand to Sit 6: Modified independent (Device/Increase time);With upper extremity assist;To chair/3-in-1      Ambulation/Gait   Ambulation/Gait Yes    Ambulation/Gait Assistance 5: Supervision    Ambulation Distance (Feet) 250 Feet    Assistive device Prosthesis;Straight cane    Ramp 5: Supervision   supervision with Hurry cane & TTA prosthesis,   Ramp Details (indicate cue type and reason) cues on carryover to treadmill for wt shift with upright posture.    Curb 5: Supervision   supervision with Hurry cane & TTA prosthesis   Curb Details (indicate cue type and reason) verbal cues on maintaining  momentum and step through      High Level Balance   High Level Balance Activities Side stepping;Backward walking;Negotiating over obstacles;Other (comment)   increasing pace & walking eyes closed   High Level Balance Comments PT verbal & tactile cues on technique with prosthesis.      Exercises   Exercises Knee/Hip      Knee/Hip Exercises: Stretches   Active Hamstring Stretch Left;Right;2 reps;30 seconds    Active Hamstring Stretch Limitations supine knee extended with strap DF      Knee/Hip Exercises: Aerobic   Tread Mill 2.2pmh for 40min 1 set with 2 bouts of 30sec at 2.7 mph with BUE support, cues to take quicker & longer stride to increase pace.    Other Aerobic treadmill to work on inclines 1.20mph for 2 min uphill & 2 min downhilll  grade 4.0-7.0%  cues to feel difference in wt shift uphill vs flat vs downhill      Knee/Hip Exercises: Machines for Strengthening   Cybex Leg Press shuttle leg press 125# 15 reps 2 sets 1st set back 45* & 2nd set back flat, lifting RLE off plate with LLE ext hold for stance control.      Prosthetics   Prosthetic Care Comments  set up & safety when staying in hotel    Current prosthetic wear tolerance (days/week)  daily    Current prosthetic wear tolerance (#hours/day)  most of awake hours    Current prosthetic weight-bearing tolerance (hours/day)  Pt tolerated standing for 8 min without limb pain but right hip gets sore.  PT instructed in standing with equal wt on LEs    Edema pitting with 5 sec capillary refill    Residual limb condition  pt reports no issues.    Education Provided Other (comment)   see prosthetic care comments   Person(s) Educated Patient    Education Method Explanation;Verbal cues    Education Method Verbalized understanding;Verbal cues required;Needs further instruction                    PT Short Term Goals - 08/01/20 0927      PT SHORT TERM GOAL #1   Title Patient verbalizes proper sweat management & signs of sweat inside liner.    Time 4    Period Weeks    Status Achieved    Target Date 08/01/20      PT SHORT TERM GOAL #2   Title Berg balace test 40/56    Time 4    Period Weeks    Status Achieved    Target Date 08/01/20      PT SHORT TERM GOAL #3   Title Patient ambulates 400' with cane & prosthesis with supervision scanning environment.    Time 4    Period Weeks    Status Achieved    Target Date 08/01/20      PT SHORT TERM GOAL #4   Title Patient negotiates ramps & curbs with prosthesis & cane with supervision.    Time 4    Period Weeks    Status Achieved    Target Date 08/01/20             PT Long Term Goals - 08/01/20 0927      PT LONG TERM GOAL #1   Title Patient tolerates prosthesis wear >90% of awake hours without skin  or limb pain issues and verbalizes proper prosthetic care.    Time 12    Period Weeks    Status  On-going    Target Date 08/30/20      PT LONG TERM GOAL #2   Title Berg Balance >/=  52/56    Time 12    Period Weeks    Status Revised    Target Date 08/30/20      PT LONG TERM GOAL #3   Title Dynamic Gait Index with cane or less & prosthesis >/= 14 / 24    Time 12    Period Weeks    Status On-going    Target Date 08/30/20      PT LONG TERM GOAL #4   Title Patient ambulates >500' with cane or less & prosthesis and negotiates ramps/curbs/stairs single rail modified independent.    Time 12    Period Weeks    Status On-going    Target Date 08/30/20      PT LONG TERM GOAL #5   Title Patient verbalizes & demonstrates understanding of how to properly use fitness equipment to return to gym per her goal.    Time 12    Period Weeks    Status On-going    Target Date 08/30/20                 Plan - 08/13/20 0808    Clinical Impression Statement Prosthesis seems to be not rotating with pads & more comfortable with proper size.  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  She is on target to meet LTGs with cane but has potential to function with prosthesis only with additional PT after current cert period.    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, Thoracotomy with lobectomy 02/24/2018,    Examination-Activity Limitations Locomotion Level;Lift;Squat;Stairs;Stand;Transfers    Examination-Participation Restrictions Community Activity    Stability/Clinical Decision Making Evolving/Moderate complexity    Rehab Potential Good    PT Frequency 2x / week    PT Duration 12 weeks    PT Treatment/Interventions ADLs/Self Care Home Management;DME Instruction;Gait training;Stair training;Functional mobility  training;Therapeutic activities;Therapeutic exercise;Balance training;Neuromuscular re-education;Patient/family education;Prosthetic Training    PT Next Visit Plan review prosthetic care, introduce how to use fitness center equipment with prosthesis  prosthetic gait with& without cane    Consulted and Agree with Plan of Care Patient           Patient will benefit from skilled therapeutic intervention in order to improve the following deficits and impairments:  Abnormal gait,Decreased activity tolerance,Decreased balance,Decreased endurance,Decreased knowledge of use of DME,Decreased mobility,Decreased range of motion,Increased edema,Postural dysfunction,Prosthetic Dependency  Visit Diagnosis: Unsteadiness on feet  Muscle weakness (generalized)  Other abnormalities of gait and mobility  Abnormal posture     Problem List Patient Active Problem List   Diagnosis Date Noted  . Uncontrolled type 2 diabetes mellitus with hyperglycemia, with long-term current use of insulin ([place]) 08/27/2017  . Hyperlipidemia associated with type 2 diabetes mellitus ([place]) 03/18/2017  . Chronic renal disease, stage V ([place]) 08/20/2015  . Personal history of noncompliance with medical treatment, presenting hazards to health 08/20/2015  . Proteinuria 07/04/2014  . Uncontrolled type 2 diabetes mellitus with ESRD (end-stage renal disease) ([place]) 05/21/2014  . Hyperlipidemia 05/21/2014  . HTN (hypertension) 05/21/2014    Jamey Reas, PT, DPT 08/13/2020, 8:44 AM  [facility] Physical Therapy [address] [place], [place], [address] Phone: [phone]   Fax:  [phone]  Name: Vinessa Macconnell MRN: [id number] Date of Birth:  07/30/1950   

## 2020-08-15 ENCOUNTER — Encounter: Payer: Self-pay | Admitting: Physical Therapy

## 2020-08-15 ENCOUNTER — Ambulatory Visit (INDEPENDENT_AMBULATORY_CARE_PROVIDER_SITE_OTHER): Payer: Medicare Other | Admitting: Physical Therapy

## 2020-08-15 ENCOUNTER — Other Ambulatory Visit: Payer: Self-pay

## 2020-08-15 DIAGNOSIS — R2689 Other abnormalities of gait and mobility: Secondary | ICD-10-CM | POA: Diagnosis not present

## 2020-08-15 DIAGNOSIS — R2681 Unsteadiness on feet: Secondary | ICD-10-CM

## 2020-08-15 DIAGNOSIS — M6281 Muscle weakness (generalized): Secondary | ICD-10-CM

## 2020-08-15 DIAGNOSIS — R293 Abnormal posture: Secondary | ICD-10-CM | POA: Diagnosis not present

## 2020-08-15 NOTE — Therapy (Signed)
[facility] Physical Therapy [address] [place], [place], [address] Phone: [phone]   Fax:  [phone]  Physical Therapy Treatment  Patient Details  Name: Shelley [name] MRN: [id number] Date of Birth: [birth year] Referring Provider (PT): Sallee Lange, MD   Encounter Date: 08/15/2020   PT End of Session - 08/15/20 0813    Visit Number 18    Number of Visits 25    Date for PT Re-Evaluation 09/03/20    Authorization Type Medicare & BCBS supp    Progress Note Due on Visit 20    PT Start Time 0810    PT Stop Time 0848    PT Time Calculation (min) 38 min    Equipment Utilized During Treatment Gait belt    Activity Tolerance Patient tolerated treatment well    Behavior During Therapy [facility] for tasks assessed/performed           Past Medical History:  Diagnosis Date  . Diabetes ([place])   . Diabetes mellitus without complication ([place])   . End stage renal disease ([place])   . HTN (hypertension)   . Hyperlipidemia   . Hypertension     Past Surgical History:  Procedure Laterality Date  . ABDOMINAL HYSTERECTOMY     fibroids/complete hysterec 2000  . APPENDECTOMY    . CATARACT EXTRACTION Bilateral   . COLONOSCOPY N/A 03/12/2016   Procedure: COLONOSCOPY;  Surgeon: Daneil Dolin, MD;  [facility];  Service: Endoscopy;  Laterality: N/A;  9:30 am  . POLYPECTOMY  03/12/2016   Procedure: POLYPECTOMY;  Surgeon: Daneil Dolin, MD;  [facility];  Service: Endoscopy;;  colon  . YAG LASER APPLICATION Left 46/07/352   Procedure: YAG LASER APPLICATION;  Surgeon: Williams Che, MD;  Location: [facility];  Service: Ophthalmology;  Laterality: Left;    There were no vitals filed for this visit.   Subjective Assessment - 08/15/20 0810    Subjective She wants to be able to get on step stool. She has both a single step & 2 step version.    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 No/denies    Pain Onset In the past 7 days                             [facility] Adult PT Treatment/Exercise - 08/15/20 0810      Transfers   Transfers Sit to Stand;Stand to Sit    Sit to Stand 6: Modified independent (Device/Increase time);With upper extremity assist;From chair/3-in-1    Stand to Sit 6: Modified independent (Device/Increase time);With upper extremity assist;To chair/3-in-1      Ambulation/Gait   Ambulation/Gait Yes    Ambulation/Gait Assistance 5: Supervision    Ambulation Distance (Feet) 250 Feet    Assistive device Prosthesis;Straight cane    Stairs Yes    Stairs Assistance 5: Supervision    Stairs Assistance Details (indicate cue type and reason) PT demo & verbal cues on technique for alternating pattern with TTA prosthesis.    Stair Management Technique Two rails;Alternating pattern;Forwards    Number of Stairs 28   11 + 11 + 6   Height of Stairs 6    Ramp 5: Supervision   supervision with Hurry cane & TTA prosthesis,  Curb 5: Supervision   supervision with Hurry cane & TTA prosthesis     High Level Balance   High Level Balance Activities --    High Level Balance Comments --      Self-Care   Other Self-Care Comments  PT discussed with pt on seat adjustment in her vehicles to enable to reach clutch with her prosthesis.  Pt verbalizes a general understanding.      Therapeutic Activites    Therapeutic Activities Other Therapeutic Activities    Other Therapeutic Activities per pt request: PT demo & instructed in stepping up & down on step stool. Pt able to return demo simulating her closet set up using Hurraycane.  PT demo & verbal cues on technique to climb 2 steps up/down ladder or step stool. Pt verbalized a general understanding.      Exercises   Exercises Knee/Hip      Knee/Hip Exercises: Stretches    Active Hamstring Stretch Left;Right;2 reps;30 seconds    Active Hamstring Stretch Limitations supine knee extended with strap DF      Knee/Hip Exercises: Aerobic   Tread Mill --    Other Aerobic --      Knee/Hip Exercises: Machines for Strengthening   Cybex Leg Press shuttle leg press 125# 15 reps 2 sets 1st set back 45* & 2nd set back flat, lifting RLE off plate with LLE ext hold for stance control.      Prosthetics   Prosthetic Care Comments  if feel air bubbles then liner has probably slid down common if sweating. Need to dry limb / liner & redonne    Current prosthetic wear tolerance (days/week)  daily    Current prosthetic wear tolerance (#hours/day)  most of awake hours    Current prosthetic weight-bearing tolerance (hours/day)  Pt tolerated standing for 8 min without limb pain but right hip gets sore.  PT instructed in standing with equal wt on LEs    Edema pitting with 5 sec capillary refill    Residual limb condition  pt reports no issues.    Education Provided Other (comment)   see prosthetic care comments   Person(s) Educated Patient    Education Method Explanation;Verbal cues    Education Method Verbalized understanding;Verbal cues required;Needs further instruction                    PT Short Term Goals - 08/01/20 0927      PT SHORT TERM GOAL #1   Title Patient verbalizes proper sweat management & signs of sweat inside liner.    Time 4    Period Weeks    Status Achieved    Target Date 08/01/20      PT SHORT TERM GOAL #2   Title Berg balace test 40/56    Time 4    Period Weeks    Status Achieved    Target Date 08/01/20      PT SHORT TERM GOAL #3   Title Patient ambulates 400' with cane & prosthesis with supervision scanning environment.    Time 4    Period Weeks    Status Achieved    Target Date 08/01/20      PT SHORT TERM GOAL #4   Title Patient negotiates ramps & curbs with prosthesis & cane with supervision.    Time 4    Period Weeks     Status Achieved    Target Date 08/01/20  PT Long Term Goals - 08/01/20 8144      PT LONG TERM GOAL #1   Title Patient tolerates prosthesis wear >90% of awake hours without skin or limb pain issues and verbalizes proper prosthetic care.    Time 12    Period Weeks    Status On-going    Target Date 08/30/20      PT LONG TERM GOAL #2   Title Berg Balance >/=  52/56    Time 12    Period Weeks    Status Revised    Target Date 08/30/20      PT LONG TERM GOAL #3   Title Dynamic Gait Index with cane or less & prosthesis >/= 14 / 24    Time 12    Period Weeks    Status On-going    Target Date 08/30/20      PT LONG TERM GOAL #4   Title Patient ambulates >500' with cane or less & prosthesis and negotiates ramps/curbs/stairs single rail modified independent.    Time 12    Period Weeks    Status On-going    Target Date 08/30/20      PT LONG TERM GOAL #5   Title Patient verbalizes & demonstrates understanding of how to properly use fitness equipment to return to gym per her goal.    Time 12    Period Weeks    Status On-going    Target Date 08/30/20                 Plan - 08/15/20 0814    Clinical Impression Statement PT instructed pt in using step stool with TTA prosthesis per her request. She appears to have a general understanding.  PT also introduced alternating pattern on stairs. She requires 2 rails for this pattern at this time.    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, Thoracotomy with lobectomy 02/24/2018,    Examination-Activity Limitations Locomotion Level;Lift;Squat;Stairs;Stand;Transfers    Examination-Participation Restrictions Community Activity    Stability/Clinical Decision Making Evolving/Moderate complexity    Rehab Potential Good    PT Frequency 2x / week    PT Duration 12 weeks    PT  Treatment/Interventions ADLs/Self Care Home Management;DME Instruction;Gait training;Stair training;Functional mobility training;Therapeutic activities;Therapeutic exercise;Balance training;Neuromuscular re-education;Patient/family education;Prosthetic Training    PT Next Visit Plan review prosthetic care, work towards [place], review climbing ladder with TTA prosthesis    Consulted and Agree with Plan of Care Patient           Patient will benefit from skilled therapeutic intervention in order to improve the following deficits and impairments:  Abnormal gait,Decreased activity tolerance,Decreased balance,Decreased endurance,Decreased knowledge of use of DME,Decreased mobility,Decreased range of motion,Increased edema,Postural dysfunction,Prosthetic Dependency  Visit Diagnosis: Unsteadiness on feet  Muscle weakness (generalized)  Other abnormalities of gait and mobility  Abnormal posture     Problem List Patient Active Problem List   Diagnosis Date Noted  . Uncontrolled type 2 diabetes mellitus with hyperglycemia, with long-term current use of insulin ([place]) 08/27/2017  . Hyperlipidemia associated with type 2 diabetes mellitus ([place]) 03/18/2017  . Chronic renal disease, stage V ([place]) 08/20/2015  . Personal history of noncompliance with medical treatment, presenting hazards to health 08/20/2015  . Proteinuria 07/04/2014  . Uncontrolled type 2 diabetes mellitus with ESRD (end-stage renal disease) ([place]) 05/21/2014  . Hyperlipidemia 05/21/2014  . HTN (hypertension) 05/21/2014  Jamey Reas, PT, DPT 08/15/2020, 11:23 AM  [facility] Physical Therapy [address] [place], [place], [address] Phone: [phone]   Fax:  [phone]  Name: Surie Suchocki MRN: [id number] Date of Birth: [birth year]

## 2020-08-20 ENCOUNTER — Ambulatory Visit (INDEPENDENT_AMBULATORY_CARE_PROVIDER_SITE_OTHER): Payer: Medicare Other | Admitting: Physical Therapy

## 2020-08-20 ENCOUNTER — Other Ambulatory Visit: Payer: Self-pay

## 2020-08-20 ENCOUNTER — Encounter: Payer: Self-pay | Admitting: Physical Therapy

## 2020-08-20 DIAGNOSIS — M6281 Muscle weakness (generalized): Secondary | ICD-10-CM | POA: Diagnosis not present

## 2020-08-20 DIAGNOSIS — R2689 Other abnormalities of gait and mobility: Secondary | ICD-10-CM

## 2020-08-20 DIAGNOSIS — R2681 Unsteadiness on feet: Secondary | ICD-10-CM

## 2020-08-20 DIAGNOSIS — R293 Abnormal posture: Secondary | ICD-10-CM

## 2020-08-20 NOTE — Therapy (Signed)
[facility] Physical Therapy [address] [place], [place], [address] Phone: [phone]   Fax:  [phone]  Physical Therapy Treatment  Patient Details  Name: Shelley [name] MRN: [id number] Date of Birth: April 04, 1950 Referring Provider (PT): Sallee Lange, MD   Encounter Date: 08/20/2020   PT End of Session - 08/20/20 0755    Visit Number 19    Number of Visits 25    Date for PT Re-Evaluation 09/03/20    Authorization Type Medicare & BCBS supp    Progress Note Due on Visit 20    PT Start Time 0758    PT Stop Time 0848    PT Time Calculation (min) 50 min    Equipment Utilized During Treatment Gait belt    Activity Tolerance Patient tolerated treatment well    Behavior During Therapy [facility] for tasks assessed/performed           Past Medical History:  Diagnosis Date  . Diabetes ([place])   . Diabetes mellitus without complication ([place])   . End stage renal disease ([place])   . HTN (hypertension)   . Hyperlipidemia   . Hypertension     Past Surgical History:  Procedure Laterality Date  . ABDOMINAL HYSTERECTOMY     fibroids/complete hysterec 2000  . APPENDECTOMY    . CATARACT EXTRACTION Bilateral   . COLONOSCOPY N/A 03/12/2016   Procedure: COLONOSCOPY;  Surgeon: Daneil Dolin, MD;  [facility];  Service: Endoscopy;  Laterality: N/A;  9:30 am  . POLYPECTOMY  03/12/2016   Procedure: POLYPECTOMY;  Surgeon: Daneil Dolin, MD;  [facility];  Service: Endoscopy;;  colon  . YAG LASER APPLICATION Left 50/0/9381   Procedure: YAG LASER APPLICATION;  Surgeon: Williams Che, MD;  Location: [facility];  Service: Ophthalmology;  Laterality: Left;    There were no vitals filed for this visit.   Subjective Assessment - 08/20/20 0756    Subjective Her car broke down Friday. She used ladder climbing technique to climb in tall tow truck.    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 No/denies    Pain Onset In the past 7 days                             [facility] Adult PT Treatment/Exercise - 08/20/20 0756      Transfers   Transfers Sit to Stand;Stand to Sit    Sit to Stand 6: Modified independent (Device/Increase time);With upper extremity assist;From chair/3-in-1    Stand to Sit 6: Modified independent (Device/Increase time);With upper extremity assist;To chair/3-in-1      Ambulation/Gait   Ambulation/Gait Yes    Ambulation/Gait Assistance 5: Supervision    Ambulation Distance (Feet) 250 Feet    Assistive device Prosthesis;Straight cane    Stairs Yes    Stairs Assistance 5: Supervision    Stairs Assistance Details (indicate cue type and reason) PT demo & verbal cues on technique for descending using LLE & ascending alternating pattern with TTA prosthesis.    Stair Management Technique One rail Right;One rail Left;With cane;Alternating pattern;Step to pattern;Forwards   descending w/2 rails alternating, single rail & cane step-to using LLE and ascending single rail & cane alternating.   Number of Stairs 11   2 reps  Height of Stairs 6    Ramp 5: Supervision   supervision with Hurry cane & TTA prosthesis,   Ramp Details (indicate cue type and reason) cues on carryover to treadmill for wt shift with upright posture.    Curb 5: Supervision   supervision with Hurry cane & TTA prosthesis   Curb Details (indicate cue type and reason) verbal cues on maintaining momentum and step through      High Level Balance   High Level Balance Activities Negotiating over obstacles;Other (comment)   increasing pace & stepping over obstacle   High Level Balance Comments PT demo, verbal & tactile cues on technique with prosthesis.      Exercises   Exercises Knee/Hip      Knee/Hip Exercises: Stretches   Active Hamstring Stretch  Left;Right;2 reps;30 seconds    Active Hamstring Stretch Limitations supine knee extended with strap DF      Knee/Hip Exercises: Aerobic   Tread Mill 2.2pmh for 41min 1 set with 2 bouts of 30sec at 2.7 mph with BUE support, cues to take quicker & longer stride to increase pace.    Other Aerobic treadmill to work on inclines 1.25mph for 2 min uphill & 2 min downhilll grade 4.0-7.0%  cues to feel difference in wt shift uphill vs flat vs downhill      Knee/Hip Exercises: Machines for Strengthening   Cybex Leg Press shuttle leg press 131# 15 reps 2 sets 1st set back 45* & 2nd set back flat, lifting RLE off plate with LLE ext hold for stance control.      Prosthetics   Prosthetic Care Comments  PT generally educated on suction sockets with TTA prosthesis and benefits    Current prosthetic wear tolerance (days/week)  daily    Current prosthetic wear tolerance (#hours/day)  most of awake hours    Current prosthetic weight-bearing tolerance (hours/day)  Pt tolerated standing for 8 min without limb pain but right hip gets sore.  PT instructed in standing with equal wt on LEs    Edema pitting with 5 sec capillary refill    Residual limb condition  pt reports no issues.    Education Provided Other (comment)   see prosthetic care comments   Person(s) Educated Patient    Education Method Explanation;Verbal cues    Education Method Verbalized understanding;Verbal cues required;Needs further instruction                    PT Short Term Goals - 08/01/20 0927      PT SHORT TERM GOAL #1   Title Patient verbalizes proper sweat management & signs of sweat inside liner.    Time 4    Period Weeks    Status Achieved    Target Date 08/01/20      PT SHORT TERM GOAL #2   Title Berg balace test 40/56    Time 4    Period Weeks    Status Achieved    Target Date 08/01/20      PT SHORT TERM GOAL #3   Title Patient ambulates 400' with cane & prosthesis with supervision scanning environment.    Time  4    Period Weeks    Status Achieved    Target Date 08/01/20      PT SHORT TERM GOAL #4   Title Patient negotiates ramps & curbs with prosthesis & cane with supervision.    Time 4    Period Weeks    Status Achieved  Target Date 08/01/20             PT Long Term Goals - 08/01/20 0927      PT LONG TERM GOAL #1   Title Patient tolerates prosthesis wear >90% of awake hours without skin or limb pain issues and verbalizes proper prosthetic care.    Time 12    Period Weeks    Status On-going    Target Date 08/30/20      PT LONG TERM GOAL #2   Title Berg Balance >/=  52/56    Time 12    Period Weeks    Status Revised    Target Date 08/30/20      PT LONG TERM GOAL #3   Title Dynamic Gait Index with cane or less & prosthesis >/= 14 / 24    Time 12    Period Weeks    Status On-going    Target Date 08/30/20      PT LONG TERM GOAL #4   Title Patient ambulates >500' with cane or less & prosthesis and negotiates ramps/curbs/stairs single rail modified independent.    Time 12    Period Weeks    Status On-going    Target Date 08/30/20      PT LONG TERM GOAL #5   Title Patient verbalizes & demonstrates understanding of how to properly use fitness equipment to return to gym per her goal.    Time 12    Period Weeks    Status On-going    Target Date 08/30/20                 Plan - 08/20/20 0755    Clinical Impression Statement Patient improved ability to negotiate ramps & curbs and step over obstacles with Hurrycane & TTA prosthesis.  PT worked on Industrial/product designer using prosthesis instead of modified technique.  She appears to have a general understandng.    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, Thoracotomy with lobectomy 02/24/2018,    Examination-Activity Limitations Locomotion Level;Lift;Squat;Stairs;Stand;Transfers     Examination-Participation Restrictions Community Activity    Stability/Clinical Decision Making Evolving/Moderate complexity    Rehab Potential Good    PT Frequency 2x / week    PT Duration 12 weeks    PT Treatment/Interventions ADLs/Self Care Home Management;DME Instruction;Gait training;Stair training;Functional mobility training;Therapeutic activities;Therapeutic exercise;Balance training;Neuromuscular re-education;Patient/family education;Prosthetic Training    PT Next Visit Plan do 10th visit note, review prosthetic care, work towards [place], review climbing ladder with TTA prosthesis    Consulted and Agree with Plan of Care Patient           Patient will benefit from skilled therapeutic intervention in order to improve the following deficits and impairments:  Abnormal gait,Decreased activity tolerance,Decreased balance,Decreased endurance,Decreased knowledge of use of DME,Decreased mobility,Decreased range of motion,Increased edema,Postural dysfunction,Prosthetic Dependency  Visit Diagnosis: Unsteadiness on feet  Muscle weakness (generalized)  Other abnormalities of gait and mobility  Abnormal posture     Problem List Patient Active Problem List   Diagnosis Date Noted  . Uncontrolled type 2 diabetes mellitus with hyperglycemia, with long-term current use of insulin ([place]) 08/27/2017  . Hyperlipidemia associated with type 2 diabetes mellitus ([place]) 03/18/2017  . Chronic renal disease, stage V ([place]) 08/20/2015  . Personal history of noncompliance with medical treatment, presenting hazards to health 08/20/2015  . Proteinuria 07/04/2014  . Uncontrolled type 2 diabetes mellitus with  ESRD (end-stage renal disease) ([place]) 05/21/2014  . Hyperlipidemia 05/21/2014  . HTN (hypertension) 05/21/2014    Jamey Reas, PT, DPT 08/20/2020, 9:01 AM  [facility] Physical Therapy [address] [place], [place], [address] Phone: [phone]   Fax:  [phone]  Name:  Shelley [name] MRN: [id number] Date of Birth: Sep 14, 1950

## 2020-08-22 ENCOUNTER — Other Ambulatory Visit: Payer: Self-pay

## 2020-08-22 ENCOUNTER — Encounter: Payer: Self-pay | Admitting: Physical Therapy

## 2020-08-22 ENCOUNTER — Ambulatory Visit (INDEPENDENT_AMBULATORY_CARE_PROVIDER_SITE_OTHER): Payer: Medicare Other | Admitting: Physical Therapy

## 2020-08-22 DIAGNOSIS — R2681 Unsteadiness on feet: Secondary | ICD-10-CM | POA: Diagnosis not present

## 2020-08-22 DIAGNOSIS — R2689 Other abnormalities of gait and mobility: Secondary | ICD-10-CM

## 2020-08-22 DIAGNOSIS — R293 Abnormal posture: Secondary | ICD-10-CM | POA: Diagnosis not present

## 2020-08-22 DIAGNOSIS — M6281 Muscle weakness (generalized): Secondary | ICD-10-CM | POA: Diagnosis not present

## 2020-08-22 NOTE — Therapy (Signed)
[facility] Physical Therapy [address] [place], [place], [address] Phone: [phone]   Fax:  [phone]  Physical Therapy Treatment & 10th visit Progress Note  Patient Details  Name: Shelley [name] MRN: [id number] Date of Birth: 08-Sep-1950 Referring Provider (PT): Sallee Lange, MD   Encounter Date: 08/22/2020   Progress Note Reporting Period 07/23/2020 to 08/22/2020  See note below for Objective Data and Assessment of Progress/Goals.        PT End of Session - 08/22/20 0755    Visit Number 20    Number of Visits 25    Date for PT Re-Evaluation 09/03/20    Authorization Type Medicare & BCBS supp    Progress Note Due on Visit 20    PT Start Time 0800    Equipment Utilized During Treatment Gait belt    Activity Tolerance Patient tolerated treatment well    Behavior During Therapy WFL for tasks assessed/performed           Past Medical History:  Diagnosis Date  . Diabetes ([place])   . Diabetes mellitus without complication ([place])   . End stage renal disease ([place])   . HTN (hypertension)   . Hyperlipidemia   . Hypertension     Past Surgical History:  Procedure Laterality Date  . ABDOMINAL HYSTERECTOMY     fibroids/complete hysterec 2000  . APPENDECTOMY    . CATARACT EXTRACTION Bilateral   . COLONOSCOPY N/A 03/12/2016   Procedure: COLONOSCOPY;  Surgeon: Daneil Dolin, MD;  [facility];  Service: Endoscopy;  Laterality: N/A;  9:30 am  . POLYPECTOMY  03/12/2016   Procedure: POLYPECTOMY;  Surgeon: Daneil Dolin, MD;  [facility];  Service: Endoscopy;;  colon  . YAG LASER APPLICATION Left 70/11/6281   Procedure: YAG LASER APPLICATION;  Surgeon: Williams Che, MD;  Location: [facility];  Service: Ophthalmology;  Laterality: Left;    There were no vitals filed for this visit.   Subjective Assessment - 08/22/20 0756    Subjective She is using cane for community mobility.  She wants to continue with PT to be able to ambulate  without device.  She used step stool as PT instructed to change a clock without any issues.    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 No/denies    Pain Onset In the past 7 days                             [facility] Adult PT Treatment/Exercise - 08/22/20 0756      Transfers   Transfers Sit to Stand;Stand to Sit    Sit to Stand 6: Modified independent (Device/Increase time);With upper extremity assist;From chair/3-in-1    Stand to Sit 6: Modified independent (Device/Increase time);With upper extremity assist;To chair/3-in-1      Ambulation/Gait   Ambulation/Gait Yes    Ambulation/Gait Assistance 5: Supervision    Ambulation/Gait Assistance Details verbal cues on technique.    Ambulation Distance (Feet) 250 Feet    Assistive device Prosthesis;Straight cane    Stairs --    Stairs Assistance --    Stair Management Technique --    Number of Stairs --    Height of Stairs --    Ramp 5: Supervision   supervision with  Hurry cane & TTA prosthesis,   Ramp Details (indicate cue type and reason) cues on carryover to treadmill for wt shift with upright posture.    Curb 5: Supervision   supervision with Hurry cane & TTA prosthesis   Curb Details (indicate cue type and reason) verbal cues on maintaining momentum and step through      High Level Balance   High Level Balance Activities Negotiating over obstacles;Other (comment);Head turns   increasing pace & stepping over obstacle   High Level Balance Comments PT demo, verbal & tactile cues on technique with prosthesis.      Exercises   Exercises Knee/Hip      Knee/Hip Exercises: Stretches   Active Hamstring Stretch Left;Right;2 reps;30 seconds    Active Hamstring Stretch Limitations supine knee extended with strap DF       Knee/Hip Exercises: Aerobic   Tread Mill 2.2pmh for 61min 1 set with 2 bouts of 30sec at 2.7 mph with BUE support, cues to take quicker & longer stride to increase pace.    Other Aerobic treadmill to work on inclines 1.15mph for 2 min uphill & 2.5 min downhilll grade 4.0-7.0%  cues to feel difference in wt shift uphill vs flat vs downhill      Knee/Hip Exercises: Machines for Strengthening   Cybex Leg Press shuttle leg press 131# 15 reps 2 sets 1st set back 45* & 2nd set back flat, lifting RLE off plate with LLE ext hold for stance control.      Prosthetics   Prosthetic Care Comments  --    Current prosthetic wear tolerance (days/week)  daily    Current prosthetic wear tolerance (#hours/day)  most of awake hours    Current prosthetic weight-bearing tolerance (hours/day)  Pt tolerated standing for 8 min without limb pain but right hip gets sore.  PT instructed in standing with equal wt on LEs    Edema pitting with 5 sec capillary refill    Residual limb condition  pt reports no issues.    Education Provided --                    PT Short Term Goals - 08/01/20 0927      PT SHORT TERM GOAL #1   Title Patient verbalizes proper sweat management & signs of sweat inside liner.    Time 4    Period Weeks    Status Achieved    Target Date 08/01/20      PT SHORT TERM GOAL #2   Title Berg balace test 40/56    Time 4    Period Weeks    Status Achieved    Target Date 08/01/20      PT SHORT TERM GOAL #3   Title Patient ambulates 400' with cane & prosthesis with supervision scanning environment.    Time 4    Period Weeks    Status Achieved    Target Date 08/01/20      PT SHORT TERM GOAL #4   Title Patient negotiates ramps & curbs with prosthesis & cane with supervision.    Time 4    Period Weeks    Status Achieved    Target Date 08/01/20             PT Long Term Goals - 08/01/20 0927      PT LONG TERM GOAL #1   Title Patient tolerates prosthesis wear >90% of awake  hours without skin or limb pain issues and verbalizes proper  prosthetic care.    Time 12    Period Weeks    Status On-going    Target Date 08/30/20      PT LONG TERM GOAL #2   Title Berg Balance >/=  52/56    Time 12    Period Weeks    Status Revised    Target Date 08/30/20      PT LONG TERM GOAL #3   Title Dynamic Gait Index with cane or less & prosthesis >/= 14 / 24    Time 12    Period Weeks    Status On-going    Target Date 08/30/20      PT LONG TERM GOAL #4   Title Patient ambulates >500' with cane or less & prosthesis and negotiates ramps/curbs/stairs single rail modified independent.    Time 12    Period Weeks    Status On-going    Target Date 08/30/20      PT LONG TERM GOAL #5   Title Patient verbalizes & demonstrates understanding of how to properly use fitness equipment to return to gym per her goal.    Time 12    Period Weeks    Status On-going    Target Date 08/30/20                 Plan - 08/22/20 0756    Clinical Impression Statement Patient is on target to meet LTGs with a cane next week.  She has the potential to function with prosthesis only without device at community level but will need additional PT for this.  Pt is in agreement and requesting additional PT.  She also is scheduled for virtual visit for discussion & prescription of socket revision next week.  PT anticipates advancing to suction suspension system and she will require instruction in new system.    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, Thoracotomy with lobectomy 02/24/2018,    Examination-Activity Limitations Locomotion Level;Lift;Squat;Stairs;Stand;Transfers    Examination-Participation Restrictions Community Activity    Stability/Clinical Decision Making Evolving/Moderate complexity    Rehab Potential Good    PT Frequency 2x / week     PT Duration 12 weeks    PT Treatment/Interventions ADLs/Self Care Home Management;DME Instruction;Gait training;Stair training;Functional mobility training;Therapeutic activities;Therapeutic exercise;Balance training;Neuromuscular re-education;Patient/family education;Prosthetic Training    PT Next Visit Plan check LTGs & recertify with higher level functioning goals.    Consulted and Agree with Plan of Care Patient           Patient will benefit from skilled therapeutic intervention in order to improve the following deficits and impairments:  Abnormal gait,Decreased activity tolerance,Decreased balance,Decreased endurance,Decreased knowledge of use of DME,Decreased mobility,Decreased range of motion,Increased edema,Postural dysfunction,Prosthetic Dependency  Visit Diagnosis: Unsteadiness on feet  Muscle weakness (generalized)  Other abnormalities of gait and mobility  Abnormal posture     Problem List Patient Active Problem List   Diagnosis Date Noted  . Uncontrolled type 2 diabetes mellitus with hyperglycemia, with long-term current use of insulin ([place]) 08/27/2017  . Hyperlipidemia associated with type 2 diabetes mellitus ([place]) 03/18/2017  . Chronic renal disease, stage V ([place]) 08/20/2015  . Personal history of noncompliance with medical treatment, presenting hazards to health 08/20/2015  . Proteinuria 07/04/2014  . Uncontrolled type 2 diabetes mellitus with ESRD (end-stage renal disease) ([place]) 05/21/2014  . Hyperlipidemia 05/21/2014  . HTN (hypertension) 05/21/2014    Jamey Reas,  PT, DPT 08/22/2020, 12:50 PM  [facility] Physical Therapy [address] [place], [place], [address] Phone: [phone]   Fax:  [phone]  Name: Shelley [name] MRN: [id number] Date of Birth: [birth year]

## 2020-08-27 ENCOUNTER — Encounter: Payer: Self-pay | Admitting: Physical Therapy

## 2020-08-27 ENCOUNTER — Other Ambulatory Visit: Payer: Self-pay

## 2020-08-27 ENCOUNTER — Ambulatory Visit (INDEPENDENT_AMBULATORY_CARE_PROVIDER_SITE_OTHER): Payer: Medicare Other | Admitting: Physical Therapy

## 2020-08-27 DIAGNOSIS — R2681 Unsteadiness on feet: Secondary | ICD-10-CM | POA: Diagnosis not present

## 2020-08-27 DIAGNOSIS — M6281 Muscle weakness (generalized): Secondary | ICD-10-CM

## 2020-08-27 DIAGNOSIS — R2689 Other abnormalities of gait and mobility: Secondary | ICD-10-CM

## 2020-08-27 DIAGNOSIS — R293 Abnormal posture: Secondary | ICD-10-CM

## 2020-08-27 NOTE — Patient Instructions (Signed)
Access Code: [email or web address] URL: [email or web address] Date: 08/27/2020 Prepared by: Jamey Reas  Exercises Seated Hamstring Stretch - 2 x daily - 7 x weekly - 1 sets - 3 reps - 30 seconds hold Seated Gastroc Stretch with Strap - 2 x daily - 7 x weekly - 1 sets - 3 reps - 30 seconds hold Seated Hamstring Stretch with Strap - 2 x daily - 7 x weekly - 1 sets - 3 reps - 30 seconds hold feet together eyes open on floor - 1 x daily - 4 x weekly - 1 sets - 10 reps - 2 seconds hold Feet Apart with Eyes Closed with Head Motions - 1 x daily - 4 x weekly - 1 sets - 10 reps - 2 seconds hold Wide stance on Foam Pad head movements - 1 x daily - 4 x weekly - 1 sets - 10 reps - 2 seconds hold Alternating Punch with Resistance - 1 x daily - 5 x weekly - 1 sets - 10 reps - 5 seconds hold Standing Scapular Protraction with Resistance - 1 x daily - 7 x weekly - 1 sets - 10 reps - 5 seconds hold Standing alternate rows with resistance - 1 x daily - 7 x weekly - 1 sets - 10 reps - 5 seconds hold Standing Row with Anchored Resistance - 1 x daily - 7 x weekly - 1 sets - 10 reps - 5 seconds hold Alternating elbow flexion with resistance - 1 x daily - 7 x weekly - 1 sets - 10 reps - 5 seconds hold Standing Bicep Curls with Resistance - 1 x daily - 7 x weekly - 1 sets - 10 reps - 5 seconds hold Standing Tapping Toes near sink - 1 x daily - 7 x weekly - 1 sets - 10 reps - 1 seconds hold Standing Tandem Balance with Counter Support - 1 x daily - 7 x weekly - 1 sets - 1 reps - 50 seconds hold Standing ball rolls front to back - 1 x daily - 7 x weekly - 1 sets - 10 reps - 1 seconds hold standing ball rolls inward / outward - 1 x daily - 7 x weekly - 1 sets - 10 reps - 5 seconds hold standing ball rolls in circles - 1 x daily - 7 x weekly - 1 sets - 10 reps - 5 seconds hold

## 2020-08-27 NOTE — Therapy (Signed)
[facility] Physical Therapy [address] [place], [place], [address] Phone: [phone]   Fax:  [phone]  Physical Therapy Treatment  Patient Details  Name: Shelley [name] MRN: [id number] Date of Birth: [birth year] Referring Provider (PT): Sallee Lange, MD   Encounter Date: 08/27/2020   PT End of Session - 08/27/20 0806    Visit Number 21    Number of Visits 25    Date for PT Re-Evaluation 09/03/20    Authorization Type Medicare & BCBS supp    Progress Note Due on Visit 20    PT Start Time 0801    PT Stop Time 0842    PT Time Calculation (min) 41 min    Equipment Utilized During Treatment Gait belt    Activity Tolerance Patient tolerated treatment well    Behavior During Therapy [facility] for tasks assessed/performed           Past Medical History:  Diagnosis Date  . Diabetes ([place])   . Diabetes mellitus without complication ([place])   . End stage renal disease ([place])   . HTN (hypertension)   . Hyperlipidemia   . Hypertension     Past Surgical History:  Procedure Laterality Date  . ABDOMINAL HYSTERECTOMY     fibroids/complete hysterec 2000  . APPENDECTOMY    . CATARACT EXTRACTION Bilateral   . COLONOSCOPY N/A 03/12/2016   Procedure: COLONOSCOPY;  Surgeon: Daneil Dolin, MD;  [facility];  Service: Endoscopy;  Laterality: N/A;  9:30 am  . POLYPECTOMY  03/12/2016   Procedure: POLYPECTOMY;  Surgeon: Daneil Dolin, MD;  [facility];  Service: Endoscopy;;  colon  . YAG LASER APPLICATION Left 63/03/4968   Procedure: YAG LASER APPLICATION;  Surgeon: Williams Che, MD;  Location: [facility];  Service: Ophthalmology;  Laterality: Left;    There were no vitals filed for this visit.   Subjective Assessment - 08/27/20 0801    Subjective She is having vascular surgery on 6/15 to clean out plaque in RLE.  No other issues.  She wear prothesis all awake hours daily without issues.    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 No/denies    Pain Onset In the past 7 days              [facility] PT Assessment - 08/27/20 0801      Assessment   Medical Diagnosis Left Transtibial Amputation    Referring Provider (PT) Sallee Lange, MD      Ambulation/Gait   Gait velocity with cane 2.95 ft/sec comfortable & 4.08 ft/sec fast pace   on 3/16 with cane comfortable was 1.57 ft/sec     Berg Balance Test   Sit to Stand Able to stand without using hands and stabilize independently    Standing Unsupported Able to stand safely 2 minutes    Sitting with Back Unsupported but Feet Supported on Floor or Stool Able to sit safely and securely 2 minutes    Stand to Sit Sits safely with minimal use of hands    Transfers Able to transfer safely, minor use of hands    Standing Unsupported with Eyes Closed Able to stand 10 seconds safely    Standing Unsupported with Feet Together Able to place feet together independently and stand 1 minute safely  From Standing, Reach Forward with Outstretched Arm Can reach confidently >25 cm (10")    From Standing Position, Pick up Object from [place] to pick up shoe safely and easily    From Standing Position, Turn to Look Behind Over each Shoulder Looks behind from both sides and weight shifts well    Turn 360 Degrees Able to turn 360 degrees safely in 4 seconds or less    Standing Unsupported, Alternately Place Feet on Step/Stool Able to stand independently and complete 8 steps >20 seconds    Standing Unsupported, One Foot in Front Able to plae foot ahead of the other independently and hold 30 seconds    Standing on One Leg Able to lift leg independently and hold equal to or more than 3 seconds    Total Score 52    Berg comment: initially was 35/56      Dynamic Gait Index   Level Surface Mild Impairment     Change in Gait Speed Mild Impairment    Gait with Horizontal Head Turns Mild Impairment    Gait with Vertical Head Turns Mild Impairment    Gait and Pivot Turn Normal    Step Over Obstacle Mild Impairment    Step Around Obstacles Normal    Steps Mild Impairment    Total Score 18    DGI comment: initial was 6/24           Prosthetics Assessment - 08/27/20 0801      Prosthetics   Prosthetic Care Independent with Skin check;Residual limb care;Prosthetic cleaning;Care of non-amputated limb;Ply sock cleaning;Correct ply sock adjustment;Proper wear schedule/adjustment;Proper weight-bearing schedule/adjustment                        [facility] Adult PT Treatment/Exercise - 08/27/20 0801      Transfers   Transfers Sit to Stand;Stand to Sit    Sit to Stand 7: Independent;Without upper extremity assist;From chair/3-in-1    Stand to Sit 7: Independent;Without upper extremity assist;To chair/3-in-1      Ambulation/Gait   Ambulation/Gait Yes    Ambulation/Gait Assistance 6: Modified independent (Device/Increase time)   with cane & prosthesis   Assistive device Prosthesis;Straight cane    Ambulation Surface Level;Indoor    Stairs Yes    Stairs Assistance 6: Modified independent (Device/Increase time)    Stair Management Technique One rail Right;With cane;Alternating pattern;Forwards    Number of Stairs 11    Height of Stairs 6    Gait Comments PT accidently tripped on her bag that she was carrying low while ambulating with cane.  She was able to catch herself without assistance or need to touch wall.  This indicates improved balance reactions.      Neuro Re-ed    Neuro Re-ed Details  Updated HEP - see pt instructions for tapping, tandem stance & tennis ball rolls      Prosthetics   Current prosthetic wear tolerance (days/week)  daily    Current prosthetic wear tolerance (#hours/day)  most of awake hours    Current prosthetic weight-bearing tolerance (hours/day)  Pt  tolerated standing for 15 min without limb pain but right hip gets sore.  PT instructed in standing with equal wt on LEs    Residual limb condition  pt reports no issues.                    PT Short Term Goals - 08/01/20 0927      PT SHORT TERM  GOAL #1   Title Patient verbalizes proper sweat management & signs of sweat inside liner.    Time 4    Period Weeks    Status Achieved    Target Date 08/01/20      PT SHORT TERM GOAL #2   Title Berg balace test 40/56    Time 4    Period Weeks    Status Achieved    Target Date 08/01/20      PT SHORT TERM GOAL #3   Title Patient ambulates 400' with cane & prosthesis with supervision scanning environment.    Time 4    Period Weeks    Status Achieved    Target Date 08/01/20      PT SHORT TERM GOAL #4   Title Patient negotiates ramps & curbs with prosthesis & cane with supervision.    Time 4    Period Weeks    Status Achieved    Target Date 08/01/20             PT Long Term Goals - 08/27/20 0854      PT LONG TERM GOAL #1   Title Patient tolerates prosthesis wear >90% of awake hours without skin or limb pain issues and verbalizes proper prosthetic care.    Time 12    Period Weeks    Status Achieved      PT LONG TERM GOAL #2   Title Berg Balance >/=  52/56    Time 12    Period Weeks    Status Achieved      PT LONG TERM GOAL #3   Title Dynamic Gait Index with cane or less & prosthesis >/= 14 / 24    Time 12    Period Weeks    Status Achieved      PT LONG TERM GOAL #4   Title Patient ambulates >500' with cane or less & prosthesis and negotiates ramps/curbs/stairs single rail modified independent.    Time 12    Period Weeks    Status On-going      PT LONG TERM GOAL #5   Title Patient verbalizes & demonstrates understanding of how to properly use fitness equipment to return to gym per her goal.    Time 12    Period Weeks    Status Achieved                 Plan - 08/27/20 0807    Clinical  Impression Statement Patient has significantly improved function with cane with lower fall risk.  Her Gait Velocity improved to 2.95 ft/sec comfortable pace and 4.09 ft/sec fast pace. Her Berg Balance improved to 52/56. Her Dynamic Gait Index with cane & prosthesis improved to 18/24.  She has potential to safely function with prosthesis only with additional PT.    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, Thoracotomy with lobectomy 02/24/2018,    Examination-Activity Limitations Locomotion Level;Lift;Squat;Stairs;Stand;Transfers    Examination-Participation Restrictions Community Activity    Stability/Clinical Decision Making Evolving/Moderate complexity    Rehab Potential Good    PT Frequency 2x / week    PT Duration 12 weeks    PT Treatment/Interventions ADLs/Self Care Home Management;DME Instruction;Gait training;Stair training;Functional mobility training;Therapeutic activities;Therapeutic exercise;Balance training;Neuromuscular re-education;Patient/family education;Prosthetic Training    PT Next Visit Plan check remaining LTG.  Test without device with prosthesis FGA and gait.    Consulted and Agree with  Plan of Care Patient           Patient will benefit from skilled therapeutic intervention in order to improve the following deficits and impairments:  Abnormal gait,Decreased activity tolerance,Decreased balance,Decreased endurance,Decreased knowledge of use of DME,Decreased mobility,Decreased range of motion,Increased edema,Postural dysfunction,Prosthetic Dependency  Visit Diagnosis: Unsteadiness on feet  Muscle weakness (generalized)  Other abnormalities of gait and mobility  Abnormal posture     Problem List Patient Active Problem List   Diagnosis Date Noted  . Uncontrolled type 2 diabetes mellitus with hyperglycemia, with long-term  current use of insulin ([place]) 08/27/2017  . Hyperlipidemia associated with type 2 diabetes mellitus ([place]) 03/18/2017  . Chronic renal disease, stage V ([place]) 08/20/2015  . Personal history of noncompliance with medical treatment, presenting hazards to health 08/20/2015  . Proteinuria 07/04/2014  . Uncontrolled type 2 diabetes mellitus with ESRD (end-stage renal disease) ([place]) 05/21/2014  . Hyperlipidemia 05/21/2014  . HTN (hypertension) 05/21/2014    Jamey Reas, PT, DPT 08/27/2020, 9:00 AM  [facility] Physical Therapy [address] [place], [place], [address] Phone: [phone]   Fax:  [phone]  Name: Shelley [name] MRN: [id number] Date of Birth: 1950/05/20

## 2020-08-28 ENCOUNTER — Encounter: Payer: Self-pay | Admitting: Physical Therapy

## 2020-08-28 ENCOUNTER — Ambulatory Visit (INDEPENDENT_AMBULATORY_CARE_PROVIDER_SITE_OTHER): Payer: Medicare Other | Admitting: Physical Therapy

## 2020-08-28 DIAGNOSIS — R2681 Unsteadiness on feet: Secondary | ICD-10-CM

## 2020-08-28 DIAGNOSIS — M6281 Muscle weakness (generalized): Secondary | ICD-10-CM

## 2020-08-28 DIAGNOSIS — R293 Abnormal posture: Secondary | ICD-10-CM

## 2020-08-28 DIAGNOSIS — R2689 Other abnormalities of gait and mobility: Secondary | ICD-10-CM

## 2020-08-28 NOTE — Therapy (Signed)
[facility] Physical Therapy [address] [place], [place], [address] Phone: [phone]   Fax:  [phone]  Physical Therapy Treatment / Recertification & Progress Note  Patient Details  Name: Shelley [name] MRN: [id number] Date of Birth: June 20, 1950 Referring Provider (PT): Sallee Lange, MD   Encounter Date: 08/28/2020   Progress Note Reporting Period 08/22/2020 to 08/28/2020  See note below for Objective Data and Assessment of Progress/Goals.        PT End of Session - 08/28/20 1005    Visit Number 22    Number of Visits 10    Date for PT Re-Evaluation 11/21/20    Authorization Type Medicare & BCBS supp    Progress Note Due on Visit 44    PT Start Time 0845    PT Stop Time 0926    PT Time Calculation (min) 41 min    Equipment Utilized During Treatment Gait belt    Activity Tolerance Patient tolerated treatment well    Behavior During Therapy WFL for tasks assessed/performed           Past Medical History:  Diagnosis Date  . Diabetes ([place])   . Diabetes mellitus without complication ([place])   . End stage renal disease ([place])   . HTN (hypertension)   . Hyperlipidemia   . Hypertension     Past Surgical History:  Procedure Laterality Date  . ABDOMINAL HYSTERECTOMY     fibroids/complete hysterec 2000  . APPENDECTOMY    . CATARACT EXTRACTION Bilateral   . COLONOSCOPY N/A 03/12/2016   Procedure: COLONOSCOPY;  Surgeon: Daneil Dolin, MD;  [facility];  Service: Endoscopy;  Laterality: N/A;  9:30 am  . POLYPECTOMY  03/12/2016   Procedure: POLYPECTOMY;  Surgeon: Daneil Dolin, MD;  [facility];  Service: Endoscopy;;  colon  . YAG LASER APPLICATION Left 03/29/7937   Procedure: YAG LASER APPLICATION;  Surgeon: Williams Che, MD;  Location: [facility];  Service: Ophthalmology;  Laterality: Left;    There were no vitals filed for this visit.       [facility] PT Assessment - 08/28/20 0845      Assessment   Medical Diagnosis Left  Transtibial Amputation    Referring Provider (PT) Sallee Lange, MD      Transfers   Transfers Sit to Stand;Stand to Sit    Sit to Stand 7: Independent;Without upper extremity assist;From chair/3-in-1    Stand to Sit 7: Independent;Without upper extremity assist;To chair/3-in-1      Ambulation/Gait   Ambulation/Gait Assistance 6: Modified independent (Device/Increase time)   with cane & prosthesis   Assistive device Prosthesis;Straight cane    Gait velocity with cane 2.95 ft/sec comfortable & 4.08 ft/sec fast pace;  without device except prosthesis 2.80 ft/sec comfortable & 3.98 ft/sec fast pace   on 3/16 with cane comfortable was 1.57 ft/sec   Stairs Yes    Stairs Assistance 6: Modified independent (Device/Increase time)    Stair Management Technique One rail Right;With cane;Alternating pattern;Forwards    Number of Stairs 11    Height of Stairs 6      Standardized Balance Assessment   Standardized Balance Assessment Timed Up and Go Test      Berg Balance Test   Sit to Stand Able to stand without using hands and stabilize independently    Standing Unsupported Able to stand safely 2 minutes    Sitting with Back Unsupported but Feet Supported on Floor or Stool Able to sit safely and securely 2  minutes    Stand to Sit Sits safely with minimal use of hands    Transfers Able to transfer safely, minor use of hands    Standing Unsupported with Eyes Closed Able to stand 10 seconds safely    Standing Unsupported with Feet Together Able to place feet together independently and stand 1 minute safely    From Standing, Reach Forward with Outstretched Arm Can reach confidently >25 cm (10")    From Standing Position, Pick up Object from Floor Able to pick up shoe safely and easily    From Standing Position, Turn to Look Behind Over each Shoulder Looks behind from both sides and weight shifts well    Turn 360 Degrees Able to turn 360 degrees safely in 4 seconds or less    Standing Unsupported,  Alternately Place Feet on Step/Stool Able to stand independently and complete 8 steps >20 seconds    Standing Unsupported, One Foot in Front Able to plae foot ahead of the other independently and hold 30 seconds    Standing on One Leg Able to lift leg independently and hold equal to or more than 3 seconds    Total Score 52    Berg comment: initially was 35/56      Dynamic Gait Index   Level Surface Mild Impairment    Change in Gait Speed Mild Impairment    Gait with Horizontal Head Turns Mild Impairment    Gait with Vertical Head Turns Mild Impairment    Gait and Pivot Turn Normal    Step Over Obstacle Mild Impairment    Step Around Obstacles Normal    Steps Mild Impairment    Total Score 18    DGI comment: initial was 6/24      Timed Up and Go Test   Normal TUG (seconds) 11.22    Cognitive TUG (seconds) 16.98   listing parts of computer. She stops talking to perform task indicating fall risk along with time. TUG:  Normal: >13.5 sec indicates high fall risk  Cognitive: >15 sec indicates high fall risk   and 51% increase from std TUG high fall risk     Functional Gait  Assessment   Gait assessed  Yes    Gait Level Surface Walks 20 ft, slow speed, abnormal gait pattern, evidence for imbalance or deviates 10-15 in outside of the 12 in walkway width. Requires more than 7 sec to ambulate 20 ft.    Change in Gait Speed Makes only minor adjustments to walking speed, or accomplishes a change in speed with significant gait deviations, deviates 10-15 in outside the 12 in walkway width, or changes speed but loses balance but is able to recover and continue walking.    Gait with Horizontal Head Turns Performs head turns smoothly with slight change in gait velocity (eg, minor disruption to smooth gait path), deviates 6-10 in outside 12 in walkway width, or uses an assistive device.    Gait with Vertical Head Turns Performs task with slight change in gait velocity (eg, minor disruption to smooth gait  path), deviates 6 - 10 in outside 12 in walkway width or uses assistive device    Gait and Pivot Turn Turns slowly, requires verbal cueing, or requires several small steps to catch balance following turn and stop    Step Over Obstacle Is able to step over one shoe box (4.5 in total height) but must slow down and adjust steps to clear box safely. May require verbal cueing.  Gait with Narrow Base of Support Ambulates less than 4 steps heel to toe or cannot perform without assistance.    Gait with Eyes Closed Walks 20 ft, slow speed, abnormal gait pattern, evidence for imbalance, deviates 10-15 in outside 12 in walkway width. Requires more than 9 sec to ambulate 20 ft.    Ambulating Backwards Walks 20 ft, slow speed, abnormal gait pattern, evidence for imbalance, deviates 10-15 in outside 12 in walkway width.    Steps Alternating feet, must use rail.   slow pace   Total Score 12    FGA comment: Max score 30  Interpretation: 25-28 = low risk fall   19-24 = medium risk fall  < 19 = high risk fall           Prosthetics Assessment - 08/28/20 0845      Prosthetics   Prosthetic Care Independent with Skin check;Residual limb care;Care of non-amputated limb;Prosthetic cleaning;Ply sock cleaning;Correct ply sock adjustment;Proper wear schedule/adjustment;Proper weight-bearing schedule/adjustment    Donning prosthesis  Modified independent (Device/Increase time)    Doffing prosthesis  Modified independent (Device/Increase time)    Current prosthetic wear tolerance (days/week)  daily    Current prosthetic wear tolerance (#hours/day)  most of awake hours    Current prosthetic weight-bearing tolerance (hours/day)  Pt tolerated standing for 15 min without limb pain but right hip gets sore.  PT instructed in standing with equal wt on LEs    Residual limb condition  No issues noted.                        [facility] Adult PT Treatment/Exercise - 08/28/20 0845      Ambulation/Gait    Ambulation/Gait Yes    Ambulation Distance (Feet) 700 Feet   700' cane   Ramp 4: Min assist   min guard without device except prosthesis   Curb 5: Supervision   prosthesis only   Gait Comments --      Knee/Hip Exercises: Stretches   Active Hamstring Stretch Both;2 reps;30 seconds    Active Hamstring Stretch Limitations supine knee extended with strap DF      Knee/Hip Exercises: Machines for Strengthening   Cybex Leg Press shuttle leg press 131# 15 reps 2 sets 1st set back 45* & 2nd set back flat, lifting RLE off plate with LLE ext hold for stance control.                    PT Short Term Goals - 08/28/20 1010      PT SHORT TERM GOAL #1   Title Patient able to perform cognitive TUG without having to stop naming items during task.    Time 4    Period Weeks    Status New    Target Date 09/26/20      PT SHORT TERM GOAL #2   Title Patient able to scan environment ambulating with prosthesis only with no balance loss    Time 4    Period Weeks    Status New    Target Date 09/26/20      PT SHORT TERM GOAL #3   Title Patient ambulates 300' with prosthesis only with supervision.    Time 4    Period Weeks    Status Revised    Target Date 09/26/20      PT SHORT TERM GOAL #4   Title Patient negotiates ramps & curbs with prosthesis only with supervision.    Time 4  Period Weeks    Status Revised    Target Date 09/26/20             PT Long Term Goals - 08/28/20 1009      PT LONG TERM GOAL #1   Title Patient tolerates prosthesis wear >90% of awake hours without skin or limb pain issues and verbalizes proper prosthetic care.    Time 12    Period Weeks    Status Achieved      PT LONG TERM GOAL #2   Title Berg Balance >/=  52/56    Time 12    Period Weeks    Status Achieved      PT LONG TERM GOAL #3   Title Dynamic Gait Index with cane or less & prosthesis >/= 14 / 24    Time 12    Period Weeks    Status Achieved      PT LONG TERM GOAL #4   Title Patient  ambulates >500' with cane or less & prosthesis and negotiates ramps/curbs/stairs single rail modified independent.    Time 12    Period Weeks    Status Achieved      PT LONG TERM GOAL #5   Title Patient verbalizes & demonstrates understanding of how to properly use fitness equipment to return to gym per her goal.    Time 12    Period Weeks    Status Achieved              PT Long Term Goals - 08/28/20 1200      PT LONG TERM GOAL #1   Title Patient tolerates wear of  prosthesis with new socket design >90% of awake hours without skin or limb pain issues and verbalizes proper prosthetic care.    Time 12    Period Weeks    Status Revised    Target Date 11/22/20      PT LONG TERM GOAL #2   Title Functional Gait Assessment > 19/30 with prosthesis only to indicate lower fall risk.    Time 12    Period Weeks    Status New    Target Date 11/22/20      PT LONG TERM GOAL #3   Title Timed Up & Go Cognitive <15 sec with no delay in cognitive task.    Time 12    Period Weeks    Status New    Target Date 11/22/20      PT LONG TERM GOAL #4   Title Patient ambulates >500' with prosthesis only and negotiates ramps/curbs/stairs single rail modified independent.    Time 12    Period Weeks    Status Revised    Target Date 11/22/20      PT LONG TERM GOAL #5   Title Patient verbalizes & demonstrates understanding of how to properly use fitness equipment to return to gym per her goal.    Time 12    Period Weeks    Status On-going    Target Date 11/22/20                Plan - 08/28/20 1012    Clinical Impression Statement Patient met all LTGs set for initial certification period. She appears to be safely functioning at community level with cane & prosthesis. She has potential to function wihout device except prosthesis at community level with additional PT.  Functional Gait Assessment of 12 /30 indicates high fall risk at this time.  Cognitive Timed Up & Go  test indicates high  fall risk when she is distracted or not focusing on her gait.  Patient needs a socket revision due to shrinkage of her limb with prosthesis use for 7 months. She would benefit from suction suspension in new socket and will need PT instruction in differences in care.    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, Thoracotomy with lobectomy 02/24/2018,    Examination-Activity Limitations Locomotion Level;Lift;Squat;Stairs;Stand;Transfers    Examination-Participation Restrictions Community Activity    Stability/Clinical Decision Making Evolving/Moderate complexity    Rehab Potential Good    PT Frequency 2x / week    PT Duration 12 weeks    PT Treatment/Interventions ADLs/Self Care Home Management;DME Instruction;Gait training;Stair training;Functional mobility training;Therapeutic activities;Therapeutic exercise;Balance training;Neuromuscular re-education;Patient/family education;Prosthetic Training    PT Next Visit Plan work towards updated STGs.    Consulted and Agree with Plan of Care Patient           Patient will benefit from skilled therapeutic intervention in order to improve the following deficits and impairments:  Abnormal gait,Decreased activity tolerance,Decreased balance,Decreased endurance,Decreased knowledge of use of DME,Decreased mobility,Decreased range of motion,Increased edema,Postural dysfunction,Prosthetic Dependency  Visit Diagnosis: Muscle weakness (generalized)  Unsteadiness on feet  Other abnormalities of gait and mobility  Abnormal posture     Problem List Patient Active Problem List   Diagnosis Date Noted  . Uncontrolled type 2 diabetes mellitus with hyperglycemia, with long-term current use of insulin ([place]) 08/27/2017  . Hyperlipidemia associated with type 2 diabetes mellitus ([place]) 03/18/2017  . Chronic renal  disease, stage V ([place]) 08/20/2015  . Personal history of noncompliance with medical treatment, presenting hazards to health 08/20/2015  . Proteinuria 07/04/2014  . Uncontrolled type 2 diabetes mellitus with ESRD (end-stage renal disease) ([place]) 05/21/2014  . Hyperlipidemia 05/21/2014  . HTN (hypertension) 05/21/2014    Jamey Reas, PT, DPT 08/28/2020, 11:57 AM  [facility] Physical Therapy [address] [place], [place], [address] Phone: [phone]   Fax:  [phone]  Name: Nashla Althoff MRN: [id number] Date of Birth: 1950-05-05

## 2020-09-03 ENCOUNTER — Encounter: Payer: Self-pay | Admitting: Physical Therapy

## 2020-09-03 ENCOUNTER — Other Ambulatory Visit: Payer: Self-pay

## 2020-09-03 ENCOUNTER — Ambulatory Visit (INDEPENDENT_AMBULATORY_CARE_PROVIDER_SITE_OTHER): Payer: Medicare Other | Admitting: Physical Therapy

## 2020-09-03 DIAGNOSIS — M6281 Muscle weakness (generalized): Secondary | ICD-10-CM

## 2020-09-03 DIAGNOSIS — R293 Abnormal posture: Secondary | ICD-10-CM | POA: Diagnosis not present

## 2020-09-03 DIAGNOSIS — R2681 Unsteadiness on feet: Secondary | ICD-10-CM | POA: Diagnosis not present

## 2020-09-03 DIAGNOSIS — R2689 Other abnormalities of gait and mobility: Secondary | ICD-10-CM | POA: Diagnosis not present

## 2020-09-03 NOTE — Therapy (Signed)
[facility] Physical Therapy [address] [place], [place], [address] Phone: [phone]   Fax:  [phone]  Physical Therapy Treatment  Patient Details  Name: Shelley [name] MRN: [id number] Date of Birth: 09-Aug-1950 Referring Provider (PT): Sallee Lange, MD   Encounter Date: 09/03/2020   PT End of Session - 09/03/20 0759     Visit Number 23    Number of Visits 69    Date for PT Re-Evaluation 11/21/20    Authorization Type Medicare & BCBS supp    Progress Note Due on Visit 33    PT Start Time 0800    PT Stop Time 0845    PT Time Calculation (min) 45 min    Equipment Utilized During Treatment Gait belt    Activity Tolerance Patient tolerated treatment well    Behavior During Therapy WFL for tasks assessed/performed             Past Medical History:  Diagnosis Date   Diabetes ([place])    Diabetes mellitus without complication ([place])    End stage renal disease ([place])    HTN (hypertension)    Hyperlipidemia    Hypertension     Past Surgical History:  Procedure Laterality Date   ABDOMINAL HYSTERECTOMY     fibroids/complete hysterec 2000   APPENDECTOMY     CATARACT EXTRACTION Bilateral    COLONOSCOPY N/A 03/12/2016   Procedure: COLONOSCOPY;  Surgeon: Daneil Dolin, MD;  [facility];  Service: Endoscopy;  Laterality: N/A;  9:30 am   POLYPECTOMY  03/12/2016   Procedure: POLYPECTOMY;  Surgeon: Daneil Dolin, MD;  [facility];  Service: Endoscopy;;  colon   YAG LASER APPLICATION Left 12/23/5850   Procedure: YAG LASER APPLICATION;  Surgeon: Williams Che, MD;  Location: [facility];  Service: Ophthalmology;  Laterality: Left;    There were no vitals filed for this visit.   Subjective Assessment - 09/03/20 0800     Subjective She had virtual appt with Dr. Lynann Bologna and he ordered new socket with suction suspension.  She has her vascular surgery tomorrow.    Pertinent History CKD st 5, DM2, HTN, renal CA, ESRD/ kidney transplant  08/30/2017, Thoracotomy with lobectomy 02/24/2018,    Patient Stated Goals to be active like previously including working out (treadmill, weight training)    Currently in Pain? No/denies    Pain Onset In the past 7 days                               [facility] Adult PT Treatment/Exercise - 09/03/20 0800       Transfers   Transfers Sit to Stand;Stand to Sit    Sit to Stand 7: Independent;Without upper extremity assist;From chair/3-in-1    Stand to Sit 7: Independent;Without upper extremity assist;To chair/3-in-1      Ambulation/Gait   Ambulation/Gait Yes    Ambulation/Gait Assistance 5: Supervision   prosthesis only   Ambulation Distance (Feet) --    Assistive device Prosthesis;None   arrives/exits with cane, session working with prosthesis only   Gait velocity --    Stairs --    Stairs Assistance --    Stair Management Technique --    Number of Stairs --    Height of Stairs --    Ramp 4: Min assist   min guard without device except prosthesis   Ramp Details (indicate cue type and reason) cues on carryover to treadmill for wt  shift with upright posture.    Curb 5: Supervision   prosthesis only   Curb Details (indicate cue type and reason) demo & verbal cues on technique with prosthesis only      High Level Balance   High Level Balance Comments standing at sink with chair back opposite - stepping forward hold 5sec & backward hold 5 sec for 3 reps ea LE ea direction.  Pt to perform as HEP.      Knee/Hip Exercises: Stretches   Active Hamstring Stretch Both;2 reps;30 seconds    Active Hamstring Stretch Limitations supine knee extended with strap DF    Other Knee/Hip Stretches standing back stretch rotation & extension 1 rep 20 sec hold    Other Knee/Hip Stretches standing upper trap stretch hold bar & sidebend of head.      Knee/Hip Exercises: Aerobic   Tread Mill 2.2pmh for 47min 1 set with 2 bouts of 30sec at 2.7 mph with BUE support, cues to take quicker & longer  stride to increase pace.    Other Aerobic treadmill to work on inclines 1.58mph for 2 min uphill & 3 min downhilll grade 4.0-7.0%  cues to feel difference in wt shift uphill vs flat vs downhill      Knee/Hip Exercises: Machines for Strengthening   Cybex Leg Press shuttle leg press 137# 15 reps 2 sets 1st set back 45* & 2nd set back flat, lifting RLE off plate with LLE ext hold for stance control.      Prosthetics   Prosthetic Care Comments  PT explained a general understanding of new socket system.    Current prosthetic wear tolerance (days/week)  daily    Current prosthetic wear tolerance (#hours/day)  most of awake hours    Current prosthetic weight-bearing tolerance (hours/day)  Pt tolerated standing for 15 min without limb pain but right hip gets sore.  PT instructed in standing with equal wt on LEs    Residual limb condition  No issues noted.                      PT Short Term Goals - 08/28/20 1010       PT SHORT TERM GOAL #1   Title Patient able to perform cognitive TUG without having to stop naming items during task.    Time 4    Period Weeks    Status New    Target Date 09/26/20      PT SHORT TERM GOAL #2   Title Patient able to scan environment ambulating with prosthesis only with no balance loss    Time 4    Period Weeks    Status New    Target Date 09/26/20      PT SHORT TERM GOAL #3   Title Patient ambulates 300' with prosthesis only with supervision.    Time 4    Period Weeks    Status Revised    Target Date 09/26/20      PT SHORT TERM GOAL #4   Title Patient negotiates ramps & curbs with prosthesis only with supervision.    Time 4    Period Weeks    Status Revised    Target Date 09/26/20               PT Long Term Goals - 08/28/20 1200       PT LONG TERM GOAL #1   Title Patient tolerates wear of  prosthesis with new socket design >90% of awake hours  without skin or limb pain issues and verbalizes proper prosthetic care.    Time 12     Period Weeks    Status Revised    Target Date 11/22/20      PT LONG TERM GOAL #2   Title Functional Gait Assessment > 19/30 with prosthesis only to indicate lower fall risk.    Time 12    Period Weeks    Status New    Target Date 11/22/20      PT LONG TERM GOAL #3   Title Timed Up & Go Cognitive <15 sec with no delay in cognitive task.    Time 12    Period Weeks    Status New    Target Date 11/22/20      PT LONG TERM GOAL #4   Title Patient ambulates >500' with prosthesis only and negotiates ramps/curbs/stairs single rail modified independent.    Time 12    Period Weeks    Status Revised    Target Date 11/22/20      PT LONG TERM GOAL #5   Title Patient verbalizes & demonstrates understanding of how to properly use fitness equipment to return to gym per her goal.    Time 12    Period Weeks    Status On-going    Target Date 11/22/20                   Plan - 09/03/20 0800     Clinical Impression Statement PT session worked on prosthetic gait without assistive device.  PT added basic exercise to facilitate step strategy and she appears to understand.    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, Thoracotomy with lobectomy 02/24/2018,    Examination-Activity Limitations Locomotion Level;Lift;Squat;Stairs;Stand;Transfers    Examination-Participation Restrictions Community Activity    Stability/Clinical Decision Making Evolving/Moderate complexity    Rehab Potential Good    PT Frequency 2x / week    PT Duration 12 weeks    PT Treatment/Interventions ADLs/Self Care Home Management;DME Instruction;Gait training;Stair training;Functional mobility training;Therapeutic activities;Therapeutic exercise;Balance training;Neuromuscular re-education;Patient/family education;Prosthetic Training    PT Next Visit Plan check that patient has  clearance after her vascular procedure,  work towards updated STGs.    Consulted and Agree with Plan of Care Patient             Patient will benefit from skilled therapeutic intervention in order to improve the following deficits and impairments:  Abnormal gait, Decreased activity tolerance, Decreased balance, Decreased endurance, Decreased knowledge of use of DME, Decreased mobility, Decreased range of motion, Increased edema, Postural dysfunction, Prosthetic Dependency  Visit Diagnosis: Muscle weakness (generalized)  Other abnormalities of gait and mobility  Unsteadiness on feet  Abnormal posture     Problem List Patient Active Problem List   Diagnosis Date Noted   Uncontrolled type 2 diabetes mellitus with hyperglycemia, with long-term current use of insulin ([place]) 08/27/2017   Hyperlipidemia associated with type 2 diabetes mellitus ([place]) 03/18/2017   Chronic renal disease, stage V ([place]) 08/20/2015   Personal history of noncompliance with medical treatment, presenting hazards to health 08/20/2015   Proteinuria 07/04/2014   Uncontrolled type 2 diabetes mellitus with ESRD (end-stage renal disease) ([place]) 05/21/2014   Hyperlipidemia 05/21/2014   HTN (hypertension) 05/21/2014    Jamey Reas, PT, DPT 09/03/2020, 9:03 AM  [facility] Physical Therapy [address] [place], [place], [address] Phone: [phone]  Fax:  [phone]  Name: Shelley [name] MRN: [id number] Date of Birth: 01-09-1951

## 2020-09-04 ENCOUNTER — Encounter: Payer: Medicare Other | Admitting: Physical Therapy

## 2020-09-10 ENCOUNTER — Other Ambulatory Visit: Payer: Self-pay

## 2020-09-10 ENCOUNTER — Ambulatory Visit (INDEPENDENT_AMBULATORY_CARE_PROVIDER_SITE_OTHER): Payer: Medicare Other | Admitting: Physical Therapy

## 2020-09-10 ENCOUNTER — Encounter: Payer: Self-pay | Admitting: Physical Therapy

## 2020-09-10 DIAGNOSIS — M6281 Muscle weakness (generalized): Secondary | ICD-10-CM | POA: Diagnosis not present

## 2020-09-10 DIAGNOSIS — R293 Abnormal posture: Secondary | ICD-10-CM | POA: Diagnosis not present

## 2020-09-10 DIAGNOSIS — R2689 Other abnormalities of gait and mobility: Secondary | ICD-10-CM

## 2020-09-10 DIAGNOSIS — R2681 Unsteadiness on feet: Secondary | ICD-10-CM | POA: Diagnosis not present

## 2020-09-10 NOTE — Therapy (Signed)
[facility] Physical Therapy [address] [place], [place], [address] Phone: [phone]   Fax:  [phone]  Physical Therapy Treatment  Patient Details  Name: Shelley [name] MRN: [id number] Date of Birth: 1951/02/07 Referring Provider (PT): Sallee Lange, MD   Encounter Date: 09/10/2020   PT End of Session - 09/10/20 1017     Visit Number 24    Number of Visits 103    Date for PT Re-Evaluation 11/21/20    Authorization Type Medicare & BCBS supp    Progress Note Due on Visit 81    PT Start Time 1016    PT Stop Time 1101    PT Time Calculation (min) 45 min    Equipment Utilized During Treatment Gait belt    Activity Tolerance Patient tolerated treatment well    Behavior During Therapy WFL for tasks assessed/performed             Past Medical History:  Diagnosis Date   Diabetes ([place])    Diabetes mellitus without complication ([place])    End stage renal disease ([place])    HTN (hypertension)    Hyperlipidemia    Hypertension     Past Surgical History:  Procedure Laterality Date   ABDOMINAL HYSTERECTOMY     fibroids/complete hysterec 2000   APPENDECTOMY     CATARACT EXTRACTION Bilateral    COLONOSCOPY N/A 03/12/2016   Procedure: COLONOSCOPY;  Surgeon: Daneil Dolin, MD;  [facility];  Service: Endoscopy;  Laterality: N/A;  9:30 am   POLYPECTOMY  03/12/2016   Procedure: POLYPECTOMY;  Surgeon: Daneil Dolin, MD;  [facility];  Service: Endoscopy;;  colon   YAG LASER APPLICATION Left 53/11/7671   Procedure: YAG LASER APPLICATION;  Surgeon: Williams Che, MD;  Location: [facility];  Service: Ophthalmology;  Laterality: Left;    There were no vitals filed for this visit.   Subjective Assessment - 09/10/20 1018     Subjective She did not have vascular surgery. At first delayed due to other emergency. Then MD decided that with wound healed that she did not need surgery.    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 No/denies    Pain Onset In the past 7 days                               [facility] Adult PT Treatment/Exercise - 09/10/20 1018       Transfers   Transfers Sit to Stand;Stand to Sit    Sit to Stand 7: Independent;Without upper extremity assist;From chair/3-in-1    Stand to Sit 7: Independent;Without upper extremity assist;To chair/3-in-1      Ambulation/Gait   Ambulation/Gait Yes    Ambulation/Gait Assistance 5: Supervision   prosthesis only   Ambulation/Gait Assistance Details verbal cues on increasing pace working on "crossing street"  tactile & verbal cues on maintaining path & pace with scanning right/left & up/down.    Assistive device Prosthesis;None   arrives/exits with cane, session working with prosthesis only   Ambulation Surface Level;Indoor    Ramp 5: Supervision   prosthesis only   Ramp Details (indicate cue type and reason) verbal cues on step length, pace adjustment & wt shift over prosthesis in stance.  Curb 5: Supervision   prosthesis only   Curb Details (indicate cue type and reason) verbal & demo cues on foot placement, maintaining movement & balance reactions.      Therapeutic Activites    Other Therapeutic Activities Pt wants to return to golf.  PT instructed with demo, tactile & verbal cues on golf swing.  Pt performed 10 reps of golf drive swing.      Prosthetics   Prosthetic Care Comments  PT instructed with demo cut off sock rationale & technique.  PT also discussed using old socket for water leg when she gets the new socket.  Pt to discuss with prosthetist on Thursday with her new fitting.    Current prosthetic wear tolerance (days/week)  daily    Current prosthetic wear tolerance (#hours/day)  most of awake hours    Current prosthetic weight-bearing  tolerance (hours/day)  Pt tolerated standing for 15 min without limb pain but right hip gets sore.  PT instructed in standing with equal wt on LEs    Residual limb condition  No issues noted.    Education Provided Other (comment);Correct ply sock adjustment   see prosthetic care comments   Person(s) Educated Patient    Education Method Explanation;Demonstration;Verbal cues    Education Method Verbalized understanding;Tactile cues required;Verbal cues required;Needs further instruction                      PT Short Term Goals - 08/28/20 1010       PT SHORT TERM GOAL #1   Title Patient able to perform cognitive TUG without having to stop naming items during task.    Time 4    Period Weeks    Status New    Target Date 09/26/20      PT SHORT TERM GOAL #2   Title Patient able to scan environment ambulating with prosthesis only with no balance loss    Time 4    Period Weeks    Status New    Target Date 09/26/20      PT SHORT TERM GOAL #3   Title Patient ambulates 300' with prosthesis only with supervision.    Time 4    Period Weeks    Status Revised    Target Date 09/26/20      PT SHORT TERM GOAL #4   Title Patient negotiates ramps & curbs with prosthesis only with supervision.    Time 4    Period Weeks    Status Revised    Target Date 09/26/20               PT Long Term Goals - 08/28/20 1200       PT LONG TERM GOAL #1   Title Patient tolerates wear of  prosthesis with new socket design >90% of awake hours without skin or limb pain issues and verbalizes proper prosthetic care.    Time 12    Period Weeks    Status Revised    Target Date 11/22/20      PT LONG TERM GOAL #2   Title Functional Gait Assessment > 19/30 with prosthesis only to indicate lower fall risk.    Time 12    Period Weeks    Status New    Target Date 11/22/20      PT LONG TERM GOAL #3   Title Timed Up & Go Cognitive <15 sec with no delay in cognitive task.    Time 12    Period  Weeks    Status New    Target Date 11/22/20      PT LONG TERM GOAL #4   Title Patient ambulates >500' with prosthesis only and negotiates ramps/curbs/stairs single rail modified independent.    Time 12    Period Weeks    Status Revised    Target Date 11/22/20      PT LONG TERM GOAL #5   Title Patient verbalizes & demonstrates understanding of how to properly use fitness equipment to return to gym per her goal.    Time 12    Period Weeks    Status On-going    Target Date 11/22/20                   Plan - 09/10/20 1017     Clinical Impression Statement PT worked on gait activities with prosthesis only increasing pace, scanning & negotiating ramps/curbs which she improved.  PT worked on golf swing for balance & return to her desired recreation. Pt appeared safe but needs more instruction to improve swing & form.  PT referred her to local golf specialist who works with adaptive golf.    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, Thoracotomy with lobectomy 02/24/2018,    Examination-Activity Limitations Locomotion Level;Lift;Squat;Stairs;Stand;Transfers    Examination-Participation Restrictions Community Activity    Stability/Clinical Decision Making Evolving/Moderate complexity    Rehab Potential Good    PT Frequency 2x / week    PT Duration 12 weeks    PT Treatment/Interventions ADLs/Self Care Home Management;DME Instruction;Gait training;Stair training;Functional mobility training;Therapeutic activities;Therapeutic exercise;Balance training;Neuromuscular re-education;Patient/family education;Prosthetic Training    PT Next Visit Plan work towards updated STGs.    Consulted and Agree with Plan of Care Patient             Patient will benefit from skilled therapeutic intervention in order to improve the following deficits and  impairments:  Abnormal gait, Decreased activity tolerance, Decreased balance, Decreased endurance, Decreased knowledge of use of DME, Decreased mobility, Decreased range of motion, Increased edema, Postural dysfunction, Prosthetic Dependency  Visit Diagnosis: Muscle weakness (generalized)  Other abnormalities of gait and mobility  Unsteadiness on feet  Abnormal posture     Problem List Patient Active Problem List   Diagnosis Date Noted   Uncontrolled type 2 diabetes mellitus with hyperglycemia, with long-term current use of insulin ([place]) 08/27/2017   Hyperlipidemia associated with type 2 diabetes mellitus ([place]) 03/18/2017   Chronic renal disease, stage V ([place]) 08/20/2015   Personal history of noncompliance with medical treatment, presenting hazards to health 08/20/2015   Proteinuria 07/04/2014   Uncontrolled type 2 diabetes mellitus with ESRD (end-stage renal disease) ([place]) 05/21/2014   Hyperlipidemia 05/21/2014   HTN (hypertension) 05/21/2014    Jamey Reas, PT, DPT 09/10/2020, 11:19 AM  [facility] Physical Therapy [address] [place], [place], [address] Phone: [phone]   Fax:  [phone]  Name: Shelley [name] MRN: [id number] Date of Birth: 1950-07-06

## 2020-09-11 ENCOUNTER — Encounter: Payer: Self-pay | Admitting: Physical Therapy

## 2020-09-11 ENCOUNTER — Ambulatory Visit (INDEPENDENT_AMBULATORY_CARE_PROVIDER_SITE_OTHER): Payer: Medicare Other | Admitting: Physical Therapy

## 2020-09-11 DIAGNOSIS — R293 Abnormal posture: Secondary | ICD-10-CM

## 2020-09-11 DIAGNOSIS — M6281 Muscle weakness (generalized): Secondary | ICD-10-CM

## 2020-09-11 DIAGNOSIS — R2689 Other abnormalities of gait and mobility: Secondary | ICD-10-CM

## 2020-09-11 DIAGNOSIS — R2681 Unsteadiness on feet: Secondary | ICD-10-CM

## 2020-09-11 NOTE — Therapy (Signed)
[facility] Physical Therapy [address] [place], [place], [address] Phone: [phone]   Fax:  [phone]  Physical Therapy Treatment  Patient Details  Name: Shelley [name] MRN: [id number] Date of Birth: 1950-05-14 Referring Provider (PT): Sallee Lange, MD   Encounter Date: 09/11/2020   PT End of Session - 09/11/20 0757     Visit Number 25    Number of Visits 7    Date for PT Re-Evaluation 11/21/20    Authorization Type Medicare & BCBS supp    Progress Note Due on Visit 61    PT Start Time 0758    PT Stop Time 0845    PT Time Calculation (min) 47 min    Equipment Utilized During Treatment Gait belt    Activity Tolerance Patient tolerated treatment well    Behavior During Therapy [facility] for tasks assessed/performed             Past Medical History:  Diagnosis Date   Diabetes ([place])    Diabetes mellitus without complication ([place])    End stage renal disease ([place])    HTN (hypertension)    Hyperlipidemia    Hypertension     Past Surgical History:  Procedure Laterality Date   ABDOMINAL HYSTERECTOMY     fibroids/complete hysterec 2000   APPENDECTOMY     CATARACT EXTRACTION Bilateral    COLONOSCOPY N/A 03/12/2016   Procedure: COLONOSCOPY;  Surgeon: Daneil Dolin, MD;  [facility];  Service: Endoscopy;  Laterality: N/A;  9:30 am   POLYPECTOMY  03/12/2016   Procedure: POLYPECTOMY;  Surgeon: Daneil Dolin, MD;  [facility];  Service: Endoscopy;;  colon   YAG LASER APPLICATION Left 27/09/8240   Procedure: YAG LASER APPLICATION;  Surgeon: Williams Che, MD;  Location: [facility];  Service: Ophthalmology;  Laterality: Left;    There were no vitals filed for this visit.   Subjective Assessment - 09/11/20 0758     Subjective Her appointment with [facility] was yesterday not Thursday and she missed it.    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 No/denies    Pain Onset In the past 7 days                               [facility] Adult PT Treatment/Exercise - 09/11/20 0758       Transfers   Transfers Sit to Stand;Stand to Sit    Sit to Stand 7: Independent;Without upper extremity assist;From chair/3-in-1    Stand to Sit 7: Independent;Without upper extremity assist;To chair/3-in-1      Ambulation/Gait   Ambulation/Gait Yes    Ambulation/Gait Assistance 5: Supervision   prosthesis only   Ambulation/Gait Assistance Details PT worked on scanning right/left & up/down, then A-Z naming, then progressed to combining the 2 activities.    Ambulation Distance (Feet) 400 Feet    Assistive device Prosthesis;None   arrives/exits with cane, session working with prosthesis only   Ramp 4: Min assist   min guard without device except prosthesis   Curb 5: Supervision   prosthesis only     High Level Balance   High Level Balance Comments standing at sink - tandem stance 1x ea foot back counting to 50 stopping when has to touch  for stability. With prosthesis in front position she was able to stand up to 20sec without touching and prosthesis in back up to [place].   Modified single leg stance - SLS RLE with prosthetic on teenis ball & SLS on prosthetic forefoot in lower cabinet. Pt verbalized how to work on these at home.      Knee/Hip Exercises: Stretches   Active Hamstring Stretch Both;2 reps;30 seconds    Active Hamstring Stretch Limitations supine knee extended with strap DF    Other Knee/Hip Stretches standing back stretch rotation & extension 1 rep 20 sec hold    Other Knee/Hip Stretches standing upper trap stretch hold bar & sidebend of head.      Knee/Hip Exercises: Aerobic   Tread Mill 2.2pmh for 61min 1 set with 2 bouts of 30sec at 2.7 mph with BUE support, cues to take quicker & longer stride to increase  pace and scanning for 3 steps ea direction right, left &up when at 2.39mph    Other Aerobic treadmill to work on inclines 1.23mph for 2 min uphill & 3 min downhilll grade 4.0-7.0%  cues to feel difference in wt shift uphill vs flat vs downhill      Knee/Hip Exercises: Machines for Strengthening   Cybex Leg Press shuttle leg press 137# 15 reps 2 sets 1st set back 45* & 2nd set back flat, lifting RLE off plate with LLE ext hold for stance control.      Prosthetics   Prosthetic Care Comments  --    Current prosthetic wear tolerance (days/week)  daily    Current prosthetic wear tolerance (#hours/day)  most of awake hours    Current prosthetic weight-bearing tolerance (hours/day)  Pt tolerated standing for 15 min without limb pain but right hip gets sore.  PT instructed in standing with equal wt on LEs    Residual limb condition  No issues noted.                    PT Education - 09/11/20 [phone]     Education Details components of fitness plan - see pt instructions    Person(s) Educated Patient    Methods Explanation;Verbal cues    Comprehension Verbalized understanding;Need further instruction              PT Short Term Goals - 08/28/20 1010       PT SHORT TERM GOAL #1   Title Patient able to perform cognitive TUG without having to stop naming items during task.    Time 4    Period Weeks    Status New    Target Date 09/26/20      PT SHORT TERM GOAL #2   Title Patient able to scan environment ambulating with prosthesis only with no balance loss    Time 4    Period Weeks    Status New    Target Date 09/26/20      PT SHORT TERM GOAL #3   Title Patient ambulates 300' with prosthesis only with supervision.    Time 4    Period Weeks    Status Revised    Target Date 09/26/20      PT SHORT TERM GOAL #4   Title Patient negotiates ramps & curbs with prosthesis only with supervision.    Time 4    Period Weeks    Status Revised    Target Date 09/26/20  PT Long Term Goals - 08/28/20 1200       PT LONG TERM GOAL #1   Title Patient tolerates wear of  prosthesis with new socket design >90% of awake hours without skin or limb pain issues and verbalizes proper prosthetic care.    Time 12    Period Weeks    Status Revised    Target Date 11/22/20      PT LONG TERM GOAL #2   Title Functional Gait Assessment > 19/30 with prosthesis only to indicate lower fall risk.    Time 12    Period Weeks    Status New    Target Date 11/22/20      PT LONG TERM GOAL #3   Title Timed Up & Go Cognitive <15 sec with no delay in cognitive task.    Time 12    Period Weeks    Status New    Target Date 11/22/20      PT LONG TERM GOAL #4   Title Patient ambulates >500' with prosthesis only and negotiates ramps/curbs/stairs single rail modified independent.    Time 12    Period Weeks    Status Revised    Target Date 11/22/20      PT LONG TERM GOAL #5   Title Patient verbalizes & demonstrates understanding of how to properly use fitness equipment to return to gym per her goal.    Time 12    Period Weeks    Status On-going    Target Date 11/22/20                   Plan - 09/11/20 0757     Clinical Impression Statement PT added scanning to treadmill at base speed of 2.51mph. PT also worked on her ability to maintain path & pace with scanning & mild cognitive task.   PT added modified tandem stance & SLS to HEP and reviewed need / benefits to stretching.    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, Thoracotomy with lobectomy 02/24/2018,    Examination-Activity Limitations Locomotion Level;Lift;Squat;Stairs;Stand;Transfers    Examination-Participation Restrictions Community Activity    Stability/Clinical Decision Making Evolving/Moderate complexity    Rehab Potential Good    PT Frequency 2x /  week    PT Duration 12 weeks    PT Treatment/Interventions ADLs/Self Care Home Management;DME Instruction;Gait training;Stair training;Functional mobility training;Therapeutic activities;Therapeutic exercise;Balance training;Neuromuscular re-education;Patient/family education;Prosthetic Training    PT Next Visit Plan work towards updated STGs.    Consulted and Agree with Plan of Care Patient             Patient will benefit from skilled therapeutic intervention in order to improve the following deficits and impairments:  Abnormal gait, Decreased activity tolerance, Decreased balance, Decreased endurance, Decreased knowledge of use of DME, Decreased mobility, Decreased range of motion, Increased edema, Postural dysfunction, Prosthetic Dependency  Visit Diagnosis: Muscle weakness (generalized)  Other abnormalities of gait and mobility  Unsteadiness on feet  Abnormal posture     Problem List Patient Active Problem List   Diagnosis Date Noted   Uncontrolled type 2 diabetes mellitus with hyperglycemia, with long-term current use of insulin ([place]) 08/27/2017   Hyperlipidemia associated with type 2 diabetes mellitus ([place]) 03/18/2017   Chronic renal disease, stage V ([place]) 08/20/2015   Personal history of noncompliance with medical treatment, presenting hazards to health 08/20/2015   Proteinuria 07/04/2014  Uncontrolled type 2 diabetes mellitus with ESRD (end-stage renal disease) ([place]) 05/21/2014   Hyperlipidemia 05/21/2014   HTN (hypertension) 05/21/2014    Jamey Reas, PT, DPT 09/11/2020, 9:40 AM  [facility] Physical Therapy [address] [place], [place], [address] Phone: [phone]   Fax:  [phone]  Name: Shelley [name] MRN: [id number] Date of Birth: 09/18/1950

## 2020-09-11 NOTE — Patient Instructions (Signed)
Fitness Plan has 4 components.  1. Endurance - Goal is 20-30 minutes. You can break time up between machines. You can do sets with rest between sets if need. Example 5 minutes work, 2 minutes rest for 3 sets. Recommend machines that are sitting with back support that uses both your arms and your legs at the same time. Or can do walking program & can use assistive device if increases time & quality of your walking.  Treadmill safety- step on & off machine with foot on solid portion not treadmill belt. Use safety lead so belt will stop if you get in trouble. Straddle belt. Turn treadmill on & set to desired speed while you are still straddling the belt. Step on & off belt leading with your good leg. Step off the belt first to adjust speed or stop.  2. Strength - Goal is form & control. Weight machines should have enough weight to have resistance but not so much that you have to strain or cheat. If you have to cheat (poor form), strain or the weight stack hits hard / out of control, then you probably have too much weight.   Goal is to do 15 repetitions for 1-3 sets. Start with 1 set and build up. Rest 30-60 seconds between sets.  Do 2-4 leg machines, 2-4 arm machines & 2-4 trunk machines. Build up to 4-6 leg machines & 4-6 arm machines. Look for pictures to make sure you exercise both sides of arm, leg or trunk. You want to balance out muscle groups that you working.   Leg machines like leg press (can do both legs or each leg by themselves),   At home can do theraband exercises for arms or legs, floor transfers, sit to stand to sit using arms as little as possible, Yoga positions 3.Flexibility - make sure you include arms, legs & trunk. Can do Yoga also 4. Balance- can work in corner with chair in front - head turns, arm motions, eyes closed; place one foot inside cabinet or on 4-6" block to work on one-legged stance,   Try to get to each component 3-5 times per week.  Going to [facility] or fitness center you  want do things you can not do at home.  For example use bikes at [facility] if you don't have one at home. Then on days you don't go to [facility], do exercises at home. Having 2 or more programs like one program for [facility] & one program for home / days that you do not go to [facility].   Recommendations are at least 150 minutes a week of moderate intensity exercise. Try not to go more than 2 days in a row without being active for at least 30 minutes a day. Any activity where you are up and moving is good- Walking, bicycling, stationary bicycling, dancing.  (moderate intensity means to get  a little out of breath). To start you may not tolerate moderate but if it is challenging to you then it is helping.    Do resistance exercise at least 2 times a week.  This can be yoga poses or strength training where you lift your own weight (think leg lifts or toe raises)  or light weights like cans of beans with your arms.    Flexibility and balance exercise- safe stretching and practicing balance is very important to health.

## 2020-09-17 ENCOUNTER — Other Ambulatory Visit: Payer: Self-pay

## 2020-09-17 ENCOUNTER — Ambulatory Visit (INDEPENDENT_AMBULATORY_CARE_PROVIDER_SITE_OTHER): Payer: Medicare Other | Admitting: Physical Therapy

## 2020-09-17 ENCOUNTER — Encounter: Payer: Self-pay | Admitting: Physical Therapy

## 2020-09-17 DIAGNOSIS — M6281 Muscle weakness (generalized): Secondary | ICD-10-CM | POA: Diagnosis not present

## 2020-09-17 DIAGNOSIS — R2689 Other abnormalities of gait and mobility: Secondary | ICD-10-CM

## 2020-09-17 DIAGNOSIS — R293 Abnormal posture: Secondary | ICD-10-CM | POA: Diagnosis not present

## 2020-09-17 DIAGNOSIS — R2681 Unsteadiness on feet: Secondary | ICD-10-CM | POA: Diagnosis not present

## 2020-09-17 NOTE — Therapy (Signed)
[facility] Physical Therapy [address] [place], [place], [address] Phone: [phone]   Fax:  [phone]  Physical Therapy Treatment  Patient Details  Name: Shelley [name] MRN: [id number] Date of Birth: Apr 15, 1950 Referring Provider (PT): Sallee Lange, MD   Encounter Date: 09/17/2020   PT End of Session - 09/17/20 0758     Visit Number 26    Number of Visits 24    Date for PT Re-Evaluation 11/21/20    Authorization Type Medicare & BCBS supp    Progress Note Due on Visit 54    PT Start Time 0758    PT Stop Time 0842    PT Time Calculation (min) 44 min    Equipment Utilized During Treatment Gait belt    Activity Tolerance Patient tolerated treatment well    Behavior During Therapy [facility] for tasks assessed/performed             Past Medical History:  Diagnosis Date   Diabetes ([place])    Diabetes mellitus without complication ([place])    End stage renal disease ([place])    HTN (hypertension)    Hyperlipidemia    Hypertension     Past Surgical History:  Procedure Laterality Date   ABDOMINAL HYSTERECTOMY     fibroids/complete hysterec 2000   APPENDECTOMY     CATARACT EXTRACTION Bilateral    COLONOSCOPY N/A 03/12/2016   Procedure: COLONOSCOPY;  Surgeon: Daneil Dolin, MD;  [facility];  Service: Endoscopy;  Laterality: N/A;  9:30 am   POLYPECTOMY  03/12/2016   Procedure: POLYPECTOMY;  Surgeon: Daneil Dolin, MD;  [facility];  Service: Endoscopy;;  colon   YAG LASER APPLICATION Left 73/04/2023   Procedure: YAG LASER APPLICATION;  Surgeon: Williams Che, MD;  Location: [facility] ORS;  Service: Ophthalmology;  Laterality: Left;    There were no vitals filed for this visit.   Subjective Assessment - 09/17/20 0758     Subjective She saw prosthetist Friday for 2nd appt in fitting new socket and will make water leg also.    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 No/denies    Pain Onset In the past 7 days                               [facility] Adult PT Treatment/Exercise - 09/17/20 0759       Transfers   Transfers Sit to Stand;Stand to Sit;Floor to Transfer    Sit to Stand 7: Independent;Without upper extremity assist;From chair/3-in-1    Stand to Sit 7: Independent;Without upper extremity assist;To chair/3-in-1    Floor to Transfer 5: Supervision;With upper extremity assist    Floor to Transfer Details (indicate cue type and reason) PT demo & verbal cues on floor transfer via half kneeling using horizontal surface to push on.  PT used internet to show garden kneel bench as option.  Pt return demo & verbalized understanding.      Ambulation/Gait   Ambulation/Gait Yes    Ambulation/Gait Assistance 5: Supervision   prosthesis only   Ambulation Distance (Feet) 400 Feet    Assistive device Prosthesis;None   arrives/exits with cane, session working with prosthesis only   Ramp 5: Supervision   without device except prosthesis  Ramp Details (indicate cue type and reason) verbal cues on step length, pace adjustment & wt shift over prosthesis in stance.    Curb 5: Supervision   prosthesis only   Curb Details (indicate cue type and reason) worked on leading with either LE      High Level Balance   High Level Balance Activities Turns;Other (comment)   increasing pace & alternating q3 steps eyes closed   High Level Balance Comments verbal cues for technique & supervision      Knee/Hip Exercises: Stretches   Active Hamstring Stretch Both;2 reps;30 seconds    Active Hamstring Stretch Limitations supine knee extended with strap DF    Other Knee/Hip Stretches standing back stretch rotation & extension 1 rep 20 sec hold    Other Knee/Hip Stretches standing upper trap stretch hold bar & sidebend of head.       Knee/Hip Exercises: Aerobic   Tread Mill 2.2pmh for 12min 1 set with 2 bouts of 30sec at 2.7 mph with BUE support, cues to take quicker & longer stride to increase pace and scanning for 3 steps ea direction right, left &up when at 2.67mph    Other Aerobic treadmill to work on inclines 1.9mph for 2 min uphill & 3 min downhilll grade 4.0-7.0%  cues to feel difference in wt shift uphill vs flat vs downhill      Knee/Hip Exercises: Machines for Strengthening   Cybex Leg Press shuttle leg press 137# 15 reps 2 sets 1st set back 45* & 2nd set back flat, lifting RLE off plate with LLE ext hold for stance control.      Prosthetics   Current prosthetic wear tolerance (days/week)  daily    Current prosthetic wear tolerance (#hours/day)  most of awake hours    Current prosthetic weight-bearing tolerance (hours/day)  Pt tolerated standing for 15 min without limb pain but right hip gets sore.  PT instructed in standing with equal wt on LEs    Residual limb condition  No issues noted.                      PT Short Term Goals - 08/28/20 1010       PT SHORT TERM GOAL #1   Title Patient able to perform cognitive TUG without having to stop naming items during task.    Time 4    Period Weeks    Status New    Target Date 09/26/20      PT SHORT TERM GOAL #2   Title Patient able to scan environment ambulating with prosthesis only with no balance loss    Time 4    Period Weeks    Status New    Target Date 09/26/20      PT SHORT TERM GOAL #3   Title Patient ambulates 300' with prosthesis only with supervision.    Time 4    Period Weeks    Status Revised    Target Date 09/26/20      PT SHORT TERM GOAL #4   Title Patient negotiates ramps & curbs with prosthesis only with supervision.    Time 4    Period Weeks    Status Revised    Target Date 09/26/20               PT Long Term Goals - 08/28/20 1200       PT LONG TERM GOAL #1   Title Patient tolerates wear of  prosthesis  with new  socket design >90% of awake hours without skin or limb pain issues and verbalizes proper prosthetic care.    Time 12    Period Weeks    Status Revised    Target Date 11/22/20      PT LONG TERM GOAL #2   Title Functional Gait Assessment > 19/30 with prosthesis only to indicate lower fall risk.    Time 12    Period Weeks    Status New    Target Date 11/22/20      PT LONG TERM GOAL #3   Title Timed Up & Go Cognitive <15 sec with no delay in cognitive task.    Time 12    Period Weeks    Status New    Target Date 11/22/20      PT LONG TERM GOAL #4   Title Patient ambulates >500' with prosthesis only and negotiates ramps/curbs/stairs single rail modified independent.    Time 12    Period Weeks    Status Revised    Target Date 11/22/20      PT LONG TERM GOAL #5   Title Patient verbalizes & demonstrates understanding of how to properly use fitness equipment to return to gym per her goal.    Time 12    Period Weeks    Status On-going    Target Date 11/22/20                   Plan - 09/17/20 0758     Clinical Impression Statement PT instructed in floor transfer in response to patient question how to get off floor in case of fall.  She appears to understand including need to assess for injuries & check if prosthesis was rotated on her limb during the fall.    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, Thoracotomy with lobectomy 02/24/2018,    Examination-Activity Limitations Locomotion Level;Lift;Squat;Stairs;Stand;Transfers    Examination-Participation Restrictions Community Activity    Stability/Clinical Decision Making Evolving/Moderate complexity    Rehab Potential Good    PT Frequency 2x / week    PT Duration 12 weeks    PT Treatment/Interventions ADLs/Self Care Home Management;DME Instruction;Gait training;Stair  training;Functional mobility training;Therapeutic activities;Therapeutic exercise;Balance training;Neuromuscular re-education;Patient/family education;Prosthetic Training    PT Next Visit Plan work towards updated STGs.    Consulted and Agree with Plan of Care Patient             Patient will benefit from skilled therapeutic intervention in order to improve the following deficits and impairments:  Abnormal gait, Decreased activity tolerance, Decreased balance, Decreased endurance, Decreased knowledge of use of DME, Decreased mobility, Decreased range of motion, Increased edema, Postural dysfunction, Prosthetic Dependency  Visit Diagnosis: Muscle weakness (generalized)  Other abnormalities of gait and mobility  Unsteadiness on feet  Abnormal posture     Problem List Patient Active Problem List   Diagnosis Date Noted   Uncontrolled type 2 diabetes mellitus with hyperglycemia, with long-term current use of insulin ([place]) 08/27/2017   Hyperlipidemia associated with type 2 diabetes mellitus ([place]) 03/18/2017   Chronic renal disease, stage V ([place]) 08/20/2015   Personal history of noncompliance with medical treatment, presenting hazards to health 08/20/2015   Proteinuria 07/04/2014   Uncontrolled type 2 diabetes mellitus with ESRD (end-stage renal disease) ([place]) 05/21/2014   Hyperlipidemia 05/21/2014   HTN (hypertension) 05/21/2014    Jamey Reas, PT, DPT 09/17/2020, 8:53 AM  [facility] Physical Therapy [address] [place], [place], [address] Phone: [phone]   Fax:  [phone]  Name: Shelley [name] MRN: [id number] Date of Birth: January 18, 1951

## 2020-09-18 ENCOUNTER — Ambulatory Visit (INDEPENDENT_AMBULATORY_CARE_PROVIDER_SITE_OTHER): Payer: Medicare Other | Admitting: Physical Therapy

## 2020-09-18 ENCOUNTER — Encounter: Payer: Self-pay | Admitting: Physical Therapy

## 2020-09-18 DIAGNOSIS — R2689 Other abnormalities of gait and mobility: Secondary | ICD-10-CM | POA: Diagnosis not present

## 2020-09-18 DIAGNOSIS — M6281 Muscle weakness (generalized): Secondary | ICD-10-CM

## 2020-09-18 DIAGNOSIS — R2681 Unsteadiness on feet: Secondary | ICD-10-CM

## 2020-09-18 DIAGNOSIS — R293 Abnormal posture: Secondary | ICD-10-CM | POA: Diagnosis not present

## 2020-09-18 NOTE — Therapy (Signed)
[facility] Physical Therapy [address] [place], [place], [address] Phone: [phone]   Fax:  [phone]  Physical Therapy Treatment  Patient Details  Name: Shelley [name] MRN: [id number] Date of Birth: [birth year] Referring Provider (PT): Sallee Lange, MD   Encounter Date: 09/18/2020   PT End of Session - 09/18/20 0755     Visit Number 27    Number of Visits 60    Date for PT Re-Evaluation 11/21/20    Authorization Type Medicare & BCBS supp    Progress Note Due on Visit 70    PT Start Time 0756    PT Stop Time 0845    PT Time Calculation (min) 49 min    Equipment Utilized During Treatment Gait belt    Activity Tolerance Patient tolerated treatment well    Behavior During Therapy WFL for tasks assessed/performed             Past Medical History:  Diagnosis Date   Diabetes ([place])    Diabetes mellitus without complication ([place])    End stage renal disease ([place])    HTN (hypertension)    Hyperlipidemia    Hypertension     Past Surgical History:  Procedure Laterality Date   ABDOMINAL HYSTERECTOMY     fibroids/complete hysterec 2000   APPENDECTOMY     CATARACT EXTRACTION Bilateral    COLONOSCOPY N/A 03/12/2016   Procedure: COLONOSCOPY;  Surgeon: Daneil Dolin, MD;  [facility];  Service: Endoscopy;  Laterality: N/A;  9:30 am   POLYPECTOMY  03/12/2016   Procedure: POLYPECTOMY;  Surgeon: Daneil Dolin, MD;  [facility];  Service: Endoscopy;;  colon   YAG LASER APPLICATION Left 75/11/1636   Procedure: YAG LASER APPLICATION;  Surgeon: Williams Che, MD;  Location: [facility];  Service: Ophthalmology;  Laterality: Left;    There were no vitals filed for this visit.   Subjective Assessment - 09/18/20 0757     Subjective no changes since yesterday.    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 No/denies    Pain Onset In the past 7 days                               [facility] Adult PT Treatment/Exercise - 09/18/20 0758       Transfers   Transfers Sit to Stand;Stand to Sit;Floor to Transfer    Sit to Stand 7: Independent;Without upper extremity assist;From chair/3-in-1    Stand to Sit 7: Independent;Without upper extremity assist;To chair/3-in-1    Floor to Transfer 5: Supervision;With upper extremity assist    Floor to Transfer Details (indicate cue type and reason) verbal cues on technique.  PT recommended performing 2-3 reps per day for next week to develop muscle memory & functional strength. pt verbalized understanding.      Ambulation/Gait   Ambulation/Gait Yes    Ambulation/Gait Assistance 5: Supervision   prosthesis only   Ambulation Distance (Feet) 400 Feet    Assistive device Prosthesis;None   arrives/exits with cane, session working with prosthesis only   Ramp 5: Supervision   without device except prosthesis   Curb 5: Supervision   prosthesis only   Curb Details (indicate cue type and reason) worked on leading with either LE  High Level Balance   High Level Balance Activities Turns;Other (comment);Backward walking;Negotiating over obstacles   increasing pace & alternating q3 steps eyes closed; worked on cognitive task of A-Z naming of animals while performing high level balance in gait.   High Level Balance Comments verbal cues for technique & supervision      Knee/Hip Exercises: Stretches   Active Hamstring Stretch Both;2 reps;30 seconds    Active Hamstring Stretch Limitations supine knee extended with strap DF    Other Knee/Hip Stretches standing back stretch rotation & extension 1 rep 20 sec hold    Other Knee/Hip Stretches standing upper trap stretch hold bar & sidebend of head.      Knee/Hip Exercises: Aerobic   Tread Mill 2.2pmh for 62min 1 set with 2 bouts of  30sec at 2.7 mph with BUE support, cues to take quicker & longer stride to increase pace and scanning for 3 steps ea direction right, left &up when at 2.48mph    Other Aerobic treadmill to work on inclines 1.77mph for 2 min uphill & 3 min downhilll grade 4.0-7.0%  cues to feel difference in wt shift uphill vs flat vs downhill      Knee/Hip Exercises: Machines for Strengthening   Cybex Leg Press shuttle leg press 137# 15 reps 2 sets 1st set back 45* & 2nd set back flat, lifting RLE off plate with LLE ext hold for stance control.      Prosthetics   Current prosthetic wear tolerance (days/week)  daily    Current prosthetic wear tolerance (#hours/day)  most of awake hours    Current prosthetic weight-bearing tolerance (hours/day)  Pt tolerated standing for 15 min without limb pain but right hip gets sore.  PT instructed in standing with equal wt on LEs    Residual limb condition  No issues noted.                      PT Short Term Goals - 09/18/20 0756       PT SHORT TERM GOAL #1   Title Patient able to perform cognitive TUG without having to stop naming items during task.    Time 4    Period Weeks    Status On-going    Target Date 09/26/20      PT SHORT TERM GOAL #2   Title Patient able to scan environment ambulating with prosthesis only with no balance loss    Time 4    Period Weeks    Status On-going    Target Date 09/26/20      PT SHORT TERM GOAL #3   Title Patient ambulates 300' with prosthesis only with supervision.    Time 4    Period Weeks    Status On-going    Target Date 09/26/20      PT SHORT TERM GOAL #4   Title Patient negotiates ramps & curbs with prosthesis only with supervision.    Time 4    Period Weeks    Status On-going    Target Date 09/26/20               PT Long Term Goals - 09/18/20 0756       PT LONG TERM GOAL #1   Title Patient tolerates wear of  prosthesis with new socket design >90% of awake hours without skin or limb pain  issues and verbalizes proper prosthetic care.    Time 12    Period Weeks    Status On-going  Target Date 11/22/20      PT LONG TERM GOAL #2   Title Functional Gait Assessment > 19/30 with prosthesis only to indicate lower fall risk.    Time 12    Period Weeks    Status On-going    Target Date 11/22/20      PT LONG TERM GOAL #3   Title Timed Up & Go Cognitive <15 sec with no delay in cognitive task.    Time 12    Period Weeks    Status On-going    Target Date 11/22/20      PT LONG TERM GOAL #4   Title Patient ambulates >500' with prosthesis only and negotiates ramps/curbs/stairs single rail modified independent.    Time 12    Period Weeks    Status On-going    Target Date 11/22/20      PT LONG TERM GOAL #5   Title Patient verbalizes & demonstrates understanding of how to properly use fitness equipment to return to gym per her goal.    Time 12    Period Weeks    Status On-going    Target Date 11/22/20                   Plan - 09/18/20 0757     Clinical Impression Statement PT worked on her ability to perform multi-task while ambulating.  She is on target to meet STGs.  PT recommended practicing floor transfers and she appears to understand.    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, Thoracotomy with lobectomy 02/24/2018,    Examination-Activity Limitations Locomotion Level;Lift;Squat;Stairs;Stand;Transfers    Examination-Participation Restrictions Community Activity    Stability/Clinical Decision Making Evolving/Moderate complexity    Rehab Potential Good    PT Frequency 2x / week    PT Duration 12 weeks    PT Treatment/Interventions ADLs/Self Care Home Management;DME Instruction;Gait training;Stair training;Functional mobility training;Therapeutic activities;Therapeutic exercise;Balance training;Neuromuscular  re-education;Patient/family education;Prosthetic Training    PT Next Visit Plan work towards updated STGs.    Consulted and Agree with Plan of Care Patient             Patient will benefit from skilled therapeutic intervention in order to improve the following deficits and impairments:  Abnormal gait, Decreased activity tolerance, Decreased balance, Decreased endurance, Decreased knowledge of use of DME, Decreased mobility, Decreased range of motion, Increased edema, Postural dysfunction, Prosthetic Dependency  Visit Diagnosis: Muscle weakness (generalized)  Other abnormalities of gait and mobility  Unsteadiness on feet  Abnormal posture     Problem List Patient Active Problem List   Diagnosis Date Noted   Uncontrolled type 2 diabetes mellitus with hyperglycemia, with long-term current use of insulin ([place]) 08/27/2017   Hyperlipidemia associated with type 2 diabetes mellitus ([place]) 03/18/2017   Chronic renal disease, stage V ([place]) 08/20/2015   Personal history of noncompliance with medical treatment, presenting hazards to health 08/20/2015   Proteinuria 07/04/2014   Uncontrolled type 2 diabetes mellitus with ESRD (end-stage renal disease) ([place]) 05/21/2014   Hyperlipidemia 05/21/2014   HTN (hypertension) 05/21/2014    Jamey Reas, PT, DPT 09/18/2020, 8:52 AM  [facility] Physical Therapy [address] [place], [place], [address] Phone: [phone]   Fax:  [phone]  Name: Shelley [name] MRN: [id number] Date of Birth: 07-Sep-1950

## 2020-09-25 ENCOUNTER — Other Ambulatory Visit: Payer: Self-pay

## 2020-09-25 ENCOUNTER — Encounter: Payer: Self-pay | Admitting: Physical Therapy

## 2020-09-25 ENCOUNTER — Ambulatory Visit (INDEPENDENT_AMBULATORY_CARE_PROVIDER_SITE_OTHER): Payer: Medicare Other | Admitting: Physical Therapy

## 2020-09-25 DIAGNOSIS — R2681 Unsteadiness on feet: Secondary | ICD-10-CM | POA: Diagnosis not present

## 2020-09-25 DIAGNOSIS — M6281 Muscle weakness (generalized): Secondary | ICD-10-CM

## 2020-09-25 DIAGNOSIS — R293 Abnormal posture: Secondary | ICD-10-CM | POA: Diagnosis not present

## 2020-09-25 DIAGNOSIS — R2689 Other abnormalities of gait and mobility: Secondary | ICD-10-CM | POA: Diagnosis not present

## 2020-09-25 NOTE — Therapy (Signed)
[facility] Physical Therapy [address] [place], [place], [address] Phone: [phone]   Fax:  [phone]  Physical Therapy Treatment  Patient Details  Name: Shelley [name] MRN: [id number] Date of Birth: 17-May-1950 Referring Provider (PT): Sallee Lange, MD   Encounter Date: 09/25/2020   PT End of Session - 09/25/20 0759     Visit Number 28    Number of Visits 31    Date for PT Re-Evaluation 11/21/20    Authorization Type Medicare & BCBS supp    Progress Note Due on Visit 46    PT Start Time 0759    PT Stop Time 0845    PT Time Calculation (min) 46 min    Equipment Utilized During Treatment Gait belt    Activity Tolerance Patient tolerated treatment well    Behavior During Therapy WFL for tasks assessed/performed             Past Medical History:  Diagnosis Date   Diabetes ([place])    Diabetes mellitus without complication ([place])    End stage renal disease ([place])    HTN (hypertension)    Hyperlipidemia    Hypertension     Past Surgical History:  Procedure Laterality Date   ABDOMINAL HYSTERECTOMY     fibroids/complete hysterec 2000   APPENDECTOMY     CATARACT EXTRACTION Bilateral    COLONOSCOPY N/A 03/12/2016   Procedure: COLONOSCOPY;  Surgeon: Daneil Dolin, MD;  [facility];  Service: Endoscopy;  Laterality: N/A;  9:30 am   POLYPECTOMY  03/12/2016   Procedure: POLYPECTOMY;  Surgeon: Daneil Dolin, MD;  [facility];  Service: Endoscopy;;  colon   YAG LASER APPLICATION Left 58/07/2776   Procedure: YAG LASER APPLICATION;  Surgeon: Williams Che, MD;  Location: [facility];  Service: Ophthalmology;  Laterality: Left;    There were no vitals filed for this visit.   Subjective Assessment - 09/25/20 0800     Subjective She could not stand up from a low toilet without anything to push up.  She had to get on floor & use tub to push up.    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 No/denies    Pain Onset In the past 7 days                               [facility] Adult PT Treatment/Exercise - 09/25/20 0800       Transfers   Transfers Sit to Stand;Stand to Sit;Floor to Transfer    Sit to Stand 5: Supervision;Without upper extremity assist;From toilet    Sit to Stand Details (indicate cue type and reason) demo & verbal cues on technique from chairs without armrests that are not stable (if push back against the chair will slide) & 16" simulated toilet.    Stand to Sit 5: Supervision;Without upper extremity assist;To toilet    Stand to Sit Details demo & verbal cues on technique to chairs without armrests that are not stable (if push back against the chair will slide) & 16" simulated toilet.    Floor to Transfer --      Ambulation/Gait   Ambulation/Gait Yes    Ambulation/Gait Assistance 5: Supervision   prosthesis only   Ambulation Distance (Feet) --  Assistive device Prosthesis;None   arrives/exits with cane, session working with prosthesis only   Ramp 5: Supervision   without device except prosthesis   Curb 5: Supervision   prosthesis only     High Level Balance   High Level Balance Activities --    High Level Balance Comments --      Knee/Hip Exercises: Stretches   Active Hamstring Stretch Both;2 reps;30 seconds    Active Hamstring Stretch Limitations supine knee extended with strap DF    Other Knee/Hip Stretches --    Other Knee/Hip Stretches --      Knee/Hip Exercises: Aerobic   Tread Mill --    Other Aerobic --      Knee/Hip Exercises: Machines for Strengthening   Cybex Leg Press shuttle leg press 137# 15 reps 2 sets 1st set back 45* & 2nd set back flat, lifting RLE off plate with LLE ext hold for stance control.      Prosthetics   Prosthetic Care Comments  pt request,  PT educated on differences with new socket & suspension including donning to toilet at night, weight & minimal expected limb volume changes.  PT also instructed in temporary cover for dress up situations.  PT educated in dress up shoe options using images on internet.    Current prosthetic wear tolerance (days/week)  daily    Current prosthetic wear tolerance (#hours/day)  most of awake hours    Current prosthetic weight-bearing tolerance (hours/day)  Pt tolerated standing for 15 min without limb pain but right hip gets sore.  PT instructed in standing with equal wt on LEs    Residual limb condition  No issues noted.    Education Provided Other (comment)   see prosthetic care comments.   Person(s) Educated Patient    Education Method Explanation;Verbal cues;Other (comment);Demonstration   internet   Education Method Verbalized understanding                      PT Short Term Goals - 09/18/20 0756       PT SHORT TERM GOAL #1   Title Patient able to perform cognitive TUG without having to stop naming items during task.    Time 4    Period Weeks    Status On-going    Target Date 09/26/20      PT SHORT TERM GOAL #2   Title Patient able to scan environment ambulating with prosthesis only with no balance loss    Time 4    Period Weeks    Status On-going    Target Date 09/26/20      PT SHORT TERM GOAL #3   Title Patient ambulates 300' with prosthesis only with supervision.    Time 4    Period Weeks    Status On-going    Target Date 09/26/20      PT SHORT TERM GOAL #4   Title Patient negotiates ramps & curbs with prosthesis only with supervision.    Time 4    Period Weeks    Status On-going    Target Date 09/26/20               PT Long Term Goals - 09/18/20 0756       PT LONG TERM GOAL #1   Title Patient tolerates wear of  prosthesis with new socket design >90% of awake hours without skin or limb pain issues and verbalizes proper prosthetic care.    Time 12  Period Weeks    Status On-going    Target Date 11/22/20      PT LONG TERM GOAL #2   Title Functional Gait Assessment > 19/30 with prosthesis only to indicate lower fall risk.    Time 12    Period Weeks    Status On-going    Target Date 11/22/20      PT LONG TERM GOAL #3   Title Timed Up & Go Cognitive <15 sec with no delay in cognitive task.    Time 12    Period Weeks    Status On-going    Target Date 11/22/20      PT LONG TERM GOAL #4   Title Patient ambulates >500' with prosthesis only and negotiates ramps/curbs/stairs single rail modified independent.    Time 12    Period Weeks    Status On-going    Target Date 11/22/20      PT LONG TERM GOAL #5   Title Patient verbalizes & demonstrates understanding of how to properly use fitness equipment to return to gym per her goal.    Time 12    Period Weeks    Status On-going    Target Date 11/22/20                   Plan - 09/25/20 0759     Clinical Impression Statement PT educated pt on sit/stand from standard ht toilets or chairs without armrests which she improved with practice.  PT also educated patient on new socket differences and changing shoes.  Pt appears to understand.    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, Thoracotomy with lobectomy 02/24/2018,    Examination-Activity Limitations Locomotion Level;Lift;Squat;Stairs;Stand;Transfers    Examination-Participation Restrictions Community Activity    Stability/Clinical Decision Making Evolving/Moderate complexity    Rehab Potential Good    PT Frequency 2x / week    PT Duration 12 weeks    PT Treatment/Interventions ADLs/Self Care Home Management;DME Instruction;Gait training;Stair training;Functional mobility training;Therapeutic activities;Therapeutic exercise;Balance training;Neuromuscular  re-education;Patient/family education;Prosthetic Training    PT Next Visit Plan check STGs.    Consulted and Agree with Plan of Care Patient             Patient will benefit from skilled therapeutic intervention in order to improve the following deficits and impairments:  Abnormal gait, Decreased activity tolerance, Decreased balance, Decreased endurance, Decreased knowledge of use of DME, Decreased mobility, Decreased range of motion, Increased edema, Postural dysfunction, Prosthetic Dependency  Visit Diagnosis: Muscle weakness (generalized)  Other abnormalities of gait and mobility  Unsteadiness on feet  Abnormal posture     Problem List Patient Active Problem List   Diagnosis Date Noted   Uncontrolled type 2 diabetes mellitus with hyperglycemia, with long-term current use of insulin ([place]) 08/27/2017   Hyperlipidemia associated with type 2 diabetes mellitus ([place]) 03/18/2017   Chronic renal disease, stage V ([place]) 08/20/2015   Personal history of noncompliance with medical treatment, presenting hazards to health 08/20/2015   Proteinuria 07/04/2014   Uncontrolled type 2 diabetes mellitus with ESRD (end-stage renal disease) ([place]) 05/21/2014   Hyperlipidemia 05/21/2014   HTN (hypertension) 05/21/2014    Jamey Reas, PT, DPT 09/25/2020, 10:52 AM  [facility] Physical Therapy [address] [place], [place], [address] Phone: [phone]   Fax:  [phone]  Name: Shelley [name] MRN: [id number] Date of Birth: 1950/10/15

## 2020-09-26 ENCOUNTER — Ambulatory Visit (INDEPENDENT_AMBULATORY_CARE_PROVIDER_SITE_OTHER): Payer: Medicare Other | Admitting: Physical Therapy

## 2020-09-26 ENCOUNTER — Encounter: Payer: Self-pay | Admitting: Physical Therapy

## 2020-09-26 DIAGNOSIS — R2681 Unsteadiness on feet: Secondary | ICD-10-CM

## 2020-09-26 DIAGNOSIS — R293 Abnormal posture: Secondary | ICD-10-CM

## 2020-09-26 DIAGNOSIS — R2689 Other abnormalities of gait and mobility: Secondary | ICD-10-CM | POA: Diagnosis not present

## 2020-09-26 DIAGNOSIS — M6281 Muscle weakness (generalized): Secondary | ICD-10-CM

## 2020-09-26 NOTE — Therapy (Signed)
[facility] Physical Therapy [address] [place], [place], [address] Phone: [phone]   Fax:  [phone]  Physical Therapy Treatment  Patient Details  Name: Shelley [name] MRN: [id number] Date of Birth: [birth year] Referring Provider (PT): Sallee Lange, MD   Encounter Date: 09/26/2020   PT End of Session - 09/26/20 0848     Visit Number 29    Number of Visits 73    Date for PT Re-Evaluation 11/21/20    Authorization Type Medicare & BCBS supp    Progress Note Due on Visit 63    PT Start Time 0845    PT Stop Time 0929    PT Time Calculation (min) 44 min    Equipment Utilized During Treatment Gait belt    Activity Tolerance Patient tolerated treatment well    Behavior During Therapy WFL for tasks assessed/performed             Past Medical History:  Diagnosis Date   Diabetes ([place])    Diabetes mellitus without complication ([place])    End stage renal disease ([place])    HTN (hypertension)    Hyperlipidemia    Hypertension     Past Surgical History:  Procedure Laterality Date   ABDOMINAL HYSTERECTOMY     fibroids/complete hysterec 2000   APPENDECTOMY     CATARACT EXTRACTION Bilateral    COLONOSCOPY N/A 03/12/2016   Procedure: COLONOSCOPY;  Surgeon: Daneil Dolin, MD;  [facility];  Service: Endoscopy;  Laterality: N/A;  9:30 am   POLYPECTOMY  03/12/2016   Procedure: POLYPECTOMY;  Surgeon: Daneil Dolin, MD;  [facility];  Service: Endoscopy;;  colon   YAG LASER APPLICATION Left 58/07/2776   Procedure: YAG LASER APPLICATION;  Surgeon: Williams Che, MD;  Location: [facility];  Service: Ophthalmology;  Laterality: Left;    There were no vitals filed for this visit.   Subjective Assessment - 09/26/20 0845     Subjective She has used her treadmill which is in her garage 2 times and is able to do 3 sets for 3 min at 1.70mh.    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 No/denies    Pain Onset In the past 7 days                [facility]       Assessment   Medical Diagnosis Left Transtibial Amputation    Referring Provider (PT) SSallee Lange MD      Timed Up and Go Test   Normal TUG (seconds) 11.01   11.22   Cognitive TUG (seconds) 13.57   was 16.98,  able to continue naming while performing.                          [facility] - 09/26[facility]20 0845       Transfers   Transfers Sit to Stand;Stand to Sit;Floor to Transfer    Sit to Stand 5: Supervision;Without upper extremity assist;From toilet    Stand to Sit 5: Supervision;Without upper extremity assist;To toilet      Ambulation/Gait   Ambulation/Gait Yes    Ambulation/Gait Assistance 5: Supervision   prosthesis only   Ambulation/Gait Assistance Details scanning environment looking at pictures without  LOB    Ambulation Distance (Feet) 400 Feet    Assistive device Prosthesis;None   arrives/exits with cane, session working with prosthesis only   Ramp 5: Supervision   without device except prosthesis   Curb 5: Supervision   prosthesis only     Knee/Hip Exercises: Stretches   Active Hamstring Stretch Both;2 reps;30 seconds    Active Hamstring Stretch Limitations supine knee extended with strap DF      Knee/Hip Exercises: Machines for Strengthening   Cybex Leg Press shuttle leg press 137# 15 reps 2 sets 1st set back 45* & 2nd set back flat, lifting RLE off plate with LLE ext hold for stance control.      Prosthetics   Prosthetic Care Comments  PT gave info on [facility].  PT reviewed shoes. Her ankle can accomodate flat up to ~1" heel. PT explained how changing shoes can effect her knee  in stance either flexing or hyperextending.    Current prosthetic wear  tolerance (days/week)  daily    Current prosthetic wear tolerance (#hours/day)  most of awake hours    Current prosthetic weight-bearing tolerance (hours/day)  Pt tolerated standing for 15 min without limb pain but right hip gets sore.  PT instructed in standing with equal wt on LEs    Residual limb condition  No issues noted.    Education Provided Other (comment)   see prosthetic care comments.   Person(s) Educated Patient    Education Method Explanation;Verbal cues;Demonstration;Handout    Education Method Verbalized understanding;Needs further instruction                      PT Short Term Goals - 09/26/20 0951       PT SHORT TERM GOAL #1   Title Patient able to perform cognitive TUG without having to stop naming items during task.    Time 4    Period Weeks    Status Achieved    Target Date 09/26/20      PT SHORT TERM GOAL #2   Title Patient able to scan environment ambulating with prosthesis only with no balance loss    Time 4    Period Weeks    Status Achieved    Target Date 09/26/20      PT SHORT TERM GOAL #3   Title Patient ambulates 300' with prosthesis only with supervision.    Time 4    Period Weeks    Status Achieved    Target Date 09/26/20      PT SHORT TERM GOAL #4   Title Patient negotiates ramps & curbs with prosthesis only with supervision.    Time 4    Period Weeks    Status Achieved    Target Date 09/26/20              PT Short Term Goals - 09/26/20 0959       PT SHORT TERM GOAL #1   Title Patient demo & verbalize how to donne new socket with suction suspension.    Time 4    Period Weeks    Status New    Target Date 10/24/20      PT SHORT TERM GOAL #2   Title Functional Gait Assessment >/= 15 / 30    Time 4    Period Weeks    Status New    Target Date 10/24/20      PT SHORT TERM GOAL #3   Title Patient  ambulates 400' with prosthesis only with supervision.    Time 4    Period Weeks    Status Revised    Target Date  10/24/20      PT SHORT TERM GOAL #4   Title Patient negotiates stairs alternating pattern with single rail with supervision.    Time 4    Period Weeks    Status New    Target Date 10/24/20               PT Long Term Goals - 09/18/20 0756       PT LONG TERM GOAL #1   Title Patient tolerates wear of  prosthesis with new socket design >90% of awake hours without skin or limb pain issues and verbalizes proper prosthetic care.    Time 12    Period Weeks    Status On-going    Target Date 11/22/20      PT LONG TERM GOAL #2   Title Functional Gait Assessment > 19/30 with prosthesis only to indicate lower fall risk.    Time 12    Period Weeks    Status On-going    Target Date 11/22/20      PT LONG TERM GOAL #3   Title Timed Up & Go Cognitive <15 sec with no delay in cognitive task.    Time 12    Period Weeks    Status On-going    Target Date 11/22/20      PT LONG TERM GOAL #4   Title Patient ambulates >500' with prosthesis only and negotiates ramps/curbs/stairs single rail modified independent.    Time 12    Period Weeks    Status On-going    Target Date 11/22/20      PT LONG TERM GOAL #5   Title Patient verbalizes & demonstrates understanding of how to properly use fitness equipment to return to gym per her goal.    Time 12    Period Weeks    Status On-going    Target Date 11/22/20                   Plan - 09/26/20 0848     Clinical Impression Statement Patient met all STGs set for this 30 day period.  She appears to understand better about changing shoes with a prosthesis.    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, Thoracotomy with lobectomy 02/24/2018,    Examination-Activity Limitations Locomotion Level;Lift;Squat;Stairs;Stand;Transfers    Examination-Participation Restrictions Community Activity     Stability/Clinical Decision Making Evolving/Moderate complexity    Rehab Potential Good    PT Frequency 2x / week    PT Duration 12 weeks    PT Treatment/Interventions ADLs/Self Care Home Management;DME Instruction;Gait training;Stair training;Functional mobility training;Therapeutic activities;Therapeutic exercise;Balance training;Neuromuscular re-education;Patient/family education;Prosthetic Training    PT Next Visit Plan work towards updated STGs.    Consulted and Agree with Plan of Care Patient             Patient will benefit from skilled therapeutic intervention in order to improve the following deficits and impairments:  Abnormal gait, Decreased activity tolerance, Decreased balance, Decreased endurance, Decreased knowledge of use of DME, Decreased mobility, Decreased range of motion, Increased edema, Postural dysfunction, Prosthetic Dependency  Visit Diagnosis: Muscle weakness (generalized)  Other abnormalities of gait and mobility  Unsteadiness on feet  Abnormal posture     Problem List Patient  Active Problem List   Diagnosis Date Noted   Uncontrolled type 2 diabetes mellitus with hyperglycemia, with long-term current use of insulin ([place]) 08/27/2017   Hyperlipidemia associated with type 2 diabetes mellitus ([place]) 03/18/2017   Chronic renal disease, stage V ([place]) 08/20/2015   Personal history of noncompliance with medical treatment, presenting hazards to health 08/20/2015   Proteinuria 07/04/2014   Uncontrolled type 2 diabetes mellitus with ESRD (end-stage renal disease) ([place]) 05/21/2014   Hyperlipidemia 05/21/2014   HTN (hypertension) 05/21/2014    Jamey Reas, PT, DPT 09/26/2020, 9:58 AM  [facility] Physical Therapy [address] [place], [place], [address] Phone: [phone]   Fax:  [phone]  Name: Shelley [name] MRN: [id number] Date of Birth: Feb 09, 1951

## 2020-09-26 NOTE — Patient Instructions (Signed)
You can change shoes on your prosthesis.  The most critical is using same heel height or pitch of the shoe. You need to know difference between sole of shoe under heel compared to sole under balls of your toes. When you place a new shoe on the prosthesis, the top of the prosthetic foot should set level and the pipe / pylon upright. If you put too tall of heel height on the prosthesis, the pylon will tip forward and buckle the knee.  If you put too short of heel height on the prosthesis, the pylon will tip backwards and push knee into hyperextension for below knee amputations or make knee harder to unlock for above knee prosthesis.  Second is design of shoe toe box. The prosthetic foot will not compress to fit narrower shoes. A curved or square toe box is easier. Also look at depth of the toe box. If too shallow then can be difficult to get prosthesis into the shoe.  Third consideration is how hard the heel is. Most shoes allow some compression as you put weight on the foot. If you put too stiff or hard heel on prosthesis, it can make the foot move from heel contact to foot flat too fast.  If you put too soft of heel on prosthesis, then the prosthesis may push you (knee) backwards as you sink weight into the prosthesis. Fourth consideration is forefoot coverage or straps. The heel of the prosthesis needs to stay down in the shoe. If the design has open area over the forefoot, then the prosthesis will slide up & down in shoe as you walk. You can have a shoe repairman add a strap across the forefoot (like a Dan Humphreys). You can wear sandals if they have at least 3 straps (one around heel, one across midfoot and one across balls of toes).

## 2020-10-01 ENCOUNTER — Other Ambulatory Visit: Payer: Self-pay

## 2020-10-01 ENCOUNTER — Ambulatory Visit (INDEPENDENT_AMBULATORY_CARE_PROVIDER_SITE_OTHER): Payer: Medicare Other | Admitting: Physical Therapy

## 2020-10-01 DIAGNOSIS — R2689 Other abnormalities of gait and mobility: Secondary | ICD-10-CM

## 2020-10-01 DIAGNOSIS — M6281 Muscle weakness (generalized): Secondary | ICD-10-CM

## 2020-10-01 DIAGNOSIS — R293 Abnormal posture: Secondary | ICD-10-CM

## 2020-10-01 DIAGNOSIS — R2681 Unsteadiness on feet: Secondary | ICD-10-CM | POA: Diagnosis not present

## 2020-10-01 NOTE — Therapy (Signed)
[facility] Physical Therapy [address] [place], [place], [address] Phone: [phone]   Fax:  [phone]  Physical Therapy Treatment  Patient Details  Name: Shelley [name] MRN: [id number] Date of Birth: 23-Aug-1950 Referring Provider (PT): Sallee Lange, MD   Encounter Date: 10/01/2020   PT End of Session - 10/01/20 0921     Visit Number 30    Number of Visits 12    Date for PT Re-Evaluation 11/21/20    Authorization Type Medicare & BCBS supp    Progress Note Due on Visit 69    PT Start Time 0800    PT Stop Time 0842    PT Time Calculation (min) 42 min    Equipment Utilized During Treatment Gait belt    Activity Tolerance Patient tolerated treatment well    Behavior During Therapy [facility] for tasks assessed/performed             Past Medical History:  Diagnosis Date   Diabetes ([place])    Diabetes mellitus without complication ([place])    End stage renal disease ([place])    HTN (hypertension)    Hyperlipidemia    Hypertension     Past Surgical History:  Procedure Laterality Date   ABDOMINAL HYSTERECTOMY     fibroids/complete hysterec 2000   APPENDECTOMY     CATARACT EXTRACTION Bilateral    COLONOSCOPY N/A 03/12/2016   Procedure: COLONOSCOPY;  Surgeon: Daneil Dolin, MD;  [facility];  Service: Endoscopy;  Laterality: N/A;  9:30 am   POLYPECTOMY  03/12/2016   Procedure: POLYPECTOMY;  Surgeon: Daneil Dolin, MD;  [facility];  Service: Endoscopy;;  colon   YAG LASER APPLICATION Left 23/07/5730   Procedure: YAG LASER APPLICATION;  Surgeon: Williams Che, MD;  Location: [facility];  Service: Ophthalmology;  Laterality: Left;    There were no vitals filed for this visit.   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, Thoracotomy with lobectomy 02/24/2018,     Patient Stated Goals to be active like previously including working out (treadmill, weight training)    Pain Onset In the past 7 days             [facility] Adult PT Treatment/Exercise - 10/01/20 0001       Transfers   Transfers Sit to Stand;Stand to Sit;Floor to Transfer    Sit to Stand 5: Supervision;Without upper extremity assist;From toilet    Stand to Sit 5: Supervision;Without upper extremity assist;To toilet      Ambulation/Gait   Ambulation/Gait Yes    Ambulation/Gait Assistance 5: Supervision   prosthesis only   Ambulation Distance (Feet) 400 Feet    Assistive device Prosthesis;None   arrives/exits with cane, session working with prosthesis only     Neuro Re-ed    Neuro Re-ed Details  in bars with intermit UE support PRN for march walking, retrowalking, 4 square step test X3 (37 sec but required mod A to keep balance on last square)      Knee/Hip Exercises: Stretches   Active Hamstring Stretch Both;2 reps;30 seconds    Active Hamstring Stretch Limitations supine knee extended with strap DF      Knee/Hip Exercises: Aerobic   Tread Mill 1.8 mph 3.5 min, then 3 min      Knee/Hip Exercises: Machines for Strengthening  Cybex Knee Extension 10# bilat 4X10    Cybex Knee Flexion 25#bilat 3X10    Cybex Leg Press shuttle leg press 137# 15 reps 2 sets 1st set back 45* & 2nd set back flat, lifting RLE off plate with LLE ext hold for stance control.                      PT Short Term Goals - 09/26/20 0959       PT SHORT TERM GOAL #1   Title Patient demo & verbalize how to donne new socket with suction suspension.    Time 4    Period Weeks    Status New    Target Date 10/24/20      PT SHORT TERM GOAL #2   Title Functional Gait Assessment >/= 15 / 30    Time 4    Period Weeks    Status New    Target Date 10/24/20      PT SHORT TERM GOAL #3   Title Patient ambulates 400' with prosthesis only with supervision.    Time 4    Period Weeks    Status Revised     Target Date 10/24/20      PT SHORT TERM GOAL #4   Title Patient negotiates stairs alternating pattern with single rail with supervision.    Time 4    Period Weeks    Status New    Target Date 10/24/20               PT Long Term Goals - 09/18/20 0756       PT LONG TERM GOAL #1   Title Patient tolerates wear of  prosthesis with new socket design >90% of awake hours without skin or limb pain issues and verbalizes proper prosthetic care.    Time 12    Period Weeks    Status On-going    Target Date 11/22/20      PT LONG TERM GOAL #2   Title Functional Gait Assessment > 19/30 with prosthesis only to indicate lower fall risk.    Time 12    Period Weeks    Status On-going    Target Date 11/22/20      PT LONG TERM GOAL #3   Title Timed Up & Go Cognitive <15 sec with no delay in cognitive task.    Time 12    Period Weeks    Status On-going    Target Date 11/22/20      PT LONG TERM GOAL #4   Title Patient ambulates >500' with prosthesis only and negotiates ramps/curbs/stairs single rail modified independent.    Time 12    Period Weeks    Status On-going    Target Date 11/22/20      PT LONG TERM GOAL #5   Title Patient verbalizes & demonstrates understanding of how to properly use fitness equipment to return to gym per her goal.    Time 12    Period Weeks    Status On-going    Target Date 11/22/20                   Plan - 10/01/20 0927     Clinical Impression Statement Session focused on overall endurance, leg strength, and dynamic balance. She did lose her balance with 4 square step test requiring mod A to keep from falling. She will get new prosthesis Friday she reports.    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, Thoracotomy with lobectomy 02/24/2018,    Examination-Activity Limitations Locomotion  Level;Lift;Squat;Stairs;Stand;Transfers    Examination-Participation Restrictions Community Activity    Stability/Clinical Decision Making Evolving/Moderate complexity    Rehab Potential Good    PT Frequency 2x / week    PT Duration 12 weeks    PT Treatment/Interventions ADLs/Self Care Home Management;DME Instruction;Gait training;Stair training;Functional mobility training;Therapeutic activities;Therapeutic exercise;Balance training;Neuromuscular re-education;Patient/family education;Prosthetic Training    PT Next Visit Plan work towards updated STGs.    Consulted and Agree with Plan of Care Patient             Patient will benefit from skilled therapeutic intervention in order to improve the following deficits and impairments:  Abnormal gait, Decreased activity tolerance, Decreased balance, Decreased endurance, Decreased knowledge of use of DME, Decreased mobility, Decreased range of motion, Increased edema, Postural dysfunction, Prosthetic Dependency  Visit Diagnosis: Muscle weakness (generalized)  Other abnormalities of gait and mobility  Unsteadiness on feet  Abnormal posture     Problem List Patient Active Problem List   Diagnosis Date Noted   Uncontrolled type 2 diabetes mellitus with hyperglycemia, with long-term current use of insulin ([place]) 08/27/2017   Hyperlipidemia associated with type 2 diabetes mellitus ([place]) 03/18/2017   Chronic renal disease, stage V ([place]) 08/20/2015   Personal history of noncompliance with medical treatment, presenting hazards to health 08/20/2015   Proteinuria 07/04/2014   Uncontrolled type 2 diabetes mellitus with ESRD (end-stage renal disease) ([place]) 05/21/2014   Hyperlipidemia 05/21/2014   HTN (hypertension) 05/21/2014    Silvestre Mesi 10/01/2020, 9:29 AM  [facility] Physical Therapy [address] [place], [place], [address] Phone: [phone]   Fax:  [phone]  Name: Alsace Dowd MRN:  [id number] Date of Birth: 08/16/1950

## 2020-10-03 ENCOUNTER — Encounter: Payer: Medicare Other | Admitting: Physical Therapy

## 2020-10-08 ENCOUNTER — Encounter: Payer: Self-pay | Admitting: Physical Therapy

## 2020-10-08 ENCOUNTER — Other Ambulatory Visit: Payer: Self-pay

## 2020-10-08 ENCOUNTER — Ambulatory Visit (INDEPENDENT_AMBULATORY_CARE_PROVIDER_SITE_OTHER): Payer: Medicare Other | Admitting: Physical Therapy

## 2020-10-08 DIAGNOSIS — R293 Abnormal posture: Secondary | ICD-10-CM

## 2020-10-08 DIAGNOSIS — M6281 Muscle weakness (generalized): Secondary | ICD-10-CM | POA: Diagnosis not present

## 2020-10-08 DIAGNOSIS — R2689 Other abnormalities of gait and mobility: Secondary | ICD-10-CM

## 2020-10-08 DIAGNOSIS — R2681 Unsteadiness on feet: Secondary | ICD-10-CM

## 2020-10-08 NOTE — Therapy (Signed)
[facility] Physical Therapy [address] [place], [place], [address] Phone: [phone]   Fax:  [phone]  Physical Therapy Treatment  Patient Details  Name: Shelley [name] MRN: [id number] Date of Birth: 09-07-1950 Referring Provider (PT): Sallee Lange, MD   Encounter Date: 10/08/2020   PT End of Session - 10/08/20 0805     Visit Number 31    Number of Visits 77    Date for PT Re-Evaluation 11/21/20    Authorization Type Medicare & BCBS supp    Progress Note Due on Visit 64    PT Start Time 0800    PT Stop Time 0846    PT Time Calculation (min) 46 min    Equipment Utilized During Treatment Gait belt    Activity Tolerance Patient tolerated treatment well    Behavior During Therapy [facility] for tasks assessed/performed             Past Medical History:  Diagnosis Date   Diabetes ([place])    Diabetes mellitus without complication ([place])    End stage renal disease ([place])    HTN (hypertension)    Hyperlipidemia    Hypertension     Past Surgical History:  Procedure Laterality Date   ABDOMINAL HYSTERECTOMY     fibroids/complete hysterec 2000   APPENDECTOMY     CATARACT EXTRACTION Bilateral    COLONOSCOPY N/A 03/12/2016   Procedure: COLONOSCOPY;  Surgeon: Daneil Dolin, MD;  [facility];  Service: Endoscopy;  Laterality: N/A;  9:30 am   POLYPECTOMY  03/12/2016   Procedure: POLYPECTOMY;  Surgeon: Daneil Dolin, MD;  [facility];  Service: Endoscopy;;  colon   YAG LASER APPLICATION Left 95/04/8411   Procedure: YAG LASER APPLICATION;  Surgeon: Williams Che, MD;  Location: [facility];  Service: Ophthalmology;  Laterality: Left;    There were no vitals filed for this visit.   Subjective Assessment - 10/08/20 0800     Subjective She got new socket with suspension on Friday and developed wound yesterday.    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 No/denies    Pain Onset In the past 7 days                               [facility] Adult PT Treatment/Exercise - 10/08/20 0800       Ambulation/Gait   Ambulation/Gait Yes    Ambulation/Gait Assistance 5: Supervision    Ambulation/Gait Assistance Details visual (line on floor), proprioception & verbal cues on not abducting prosthesis.  She improved her ability to ambulate without abduction.    Ambulation Distance (Feet) 200 Feet   200' X 2   Assistive device Prosthesis;None      Neuro Re-ed    Neuro Re-ed Details  tactile, verbal & visual (mirror) cues for pelvic shift for even weight bearing in stance. PT recommended finding this midline each time that she stands still.      Knee/Hip Exercises: Standing   Rocker Board 1 minute   ant/level/post & right/level/left   Rocker Board Limitations BUE support, round board w/single pivot point,  visual (mirror), tactile & verbal cues on pelvic motion with goal to facilitate hip strategy      Prosthetics   Prosthetic Care  Comments  PT demo & verbal cues donning new suction pin system.  PT covered wound with Tegaderm & instructed in use until wound heals. PT spoke with prosthetist who will adjust proximal socket where wound is.  Also PT recommending using 1/4" then 1/8" heel lift on sound side until her hip muscles accommodate to level pelvis.    Current prosthetic wear tolerance (days/week)  daily    Current prosthetic wear tolerance (#hours/day)  most of awake hours    Residual limb condition  horizontal linear wound at proximal lateral socket area.    Education Provided Skin check;Residual limb care;Other (comment)   see prosthetic care comments   Person(s) Educated Patient    Education Method Explanation;Demonstration;Tactile cues;Verbal cues    Education Method Verbalized understanding;Returned  demonstration;Tactile cues required;Verbal cues required;Needs further instruction                      PT Short Term Goals - 09/26/20 0959       PT SHORT TERM GOAL #1   Title Patient demo & verbalize how to donne new socket with suction suspension.    Time 4    Period Weeks    Status New    Target Date 10/24/20      PT SHORT TERM GOAL #2   Title Functional Gait Assessment >/= 15 / 30    Time 4    Period Weeks    Status New    Target Date 10/24/20      PT SHORT TERM GOAL #3   Title Patient ambulates 400' with prosthesis only with supervision.    Time 4    Period Weeks    Status Revised    Target Date 10/24/20      PT SHORT TERM GOAL #4   Title Patient negotiates stairs alternating pattern with single rail with supervision.    Time 4    Period Weeks    Status New    Target Date 10/24/20               PT Long Term Goals - 09/18/20 0756       PT LONG TERM GOAL #1   Title Patient tolerates wear of  prosthesis with new socket design >90% of awake hours without skin or limb pain issues and verbalizes proper prosthetic care.    Time 12    Period Weeks    Status On-going    Target Date 11/22/20      PT LONG TERM GOAL #2   Title Functional Gait Assessment > 19/30 with prosthesis only to indicate lower fall risk.    Time 12    Period Weeks    Status On-going    Target Date 11/22/20      PT LONG TERM GOAL #3   Title Timed Up & Go Cognitive <15 sec with no delay in cognitive task.    Time 12    Period Weeks    Status On-going    Target Date 11/22/20      PT LONG TERM GOAL #4   Title Patient ambulates >500' with prosthesis only and negotiates ramps/curbs/stairs single rail modified independent.    Time 12    Period Weeks    Status On-going    Target Date 11/22/20      PT LONG TERM GOAL #5   Title Patient verbalizes & demonstrates understanding of how to properly use fitness equipment to return to gym per her goal.    Time  12    Period Weeks     Status On-going    Target Date 11/22/20                   Plan - 10/08/20 0805     Clinical Impression Statement Patient received new socket with suction pin suspension 5 days ago.  Her hip pain is probably from new height of prosthesis as her old set up was too short.   She progressed sound limb knee not flexed in stance & limb seated deep into socket created height difference.  PT recommending using lifts on sound side to temporary create mild leg length difference until her muscles get use to system.  Patient appears to have better understanding of donning new system to minimize pistoning.    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, Thoracotomy with lobectomy 02/24/2018,    Examination-Activity Limitations Locomotion Level;Lift;Squat;Stairs;Stand;Transfers    Examination-Participation Restrictions Community Activity    Stability/Clinical Decision Making Evolving/Moderate complexity    Rehab Potential Good    PT Frequency 2x / week    PT Duration 12 weeks    PT Treatment/Interventions ADLs/Self Care Home Management;DME Instruction;Gait training;Stair training;Functional mobility training;Therapeutic activities;Therapeutic exercise;Balance training;Neuromuscular re-education;Patient/family education;Prosthetic Training    PT Next Visit Plan Do 10th visit note,  work towards updated STGs.    Consulted and Agree with Plan of Care Patient             Patient will benefit from skilled therapeutic intervention in order to improve the following deficits and impairments:  Abnormal gait, Decreased activity tolerance, Decreased balance, Decreased endurance, Decreased knowledge of use of DME, Decreased mobility, Decreased range of motion, Increased edema, Postural dysfunction, Prosthetic Dependency  Visit Diagnosis: Muscle weakness  (generalized)  Other abnormalities of gait and mobility  Unsteadiness on feet  Abnormal posture     Problem List Patient Active Problem List   Diagnosis Date Noted   Uncontrolled type 2 diabetes mellitus with hyperglycemia, with long-term current use of insulin ([place]) 08/27/2017   Hyperlipidemia associated with type 2 diabetes mellitus ([place]) 03/18/2017   Chronic renal disease, stage V ([place]) 08/20/2015   Personal history of noncompliance with medical treatment, presenting hazards to health 08/20/2015   Proteinuria 07/04/2014   Uncontrolled type 2 diabetes mellitus with ESRD (end-stage renal disease) ([place]) 05/21/2014   Hyperlipidemia 05/21/2014   HTN (hypertension) 05/21/2014    Jamey Reas, PT, DPT 10/08/2020, 9:02 AM  [facility] Physical Therapy [address] [place], [place], [address] Phone: [phone]   Fax:  [phone]  Name: Shelley [name] MRN: [id number] Date of Birth: [birth year]

## 2020-10-10 ENCOUNTER — Encounter: Payer: Self-pay | Admitting: Physical Therapy

## 2020-10-10 ENCOUNTER — Other Ambulatory Visit: Payer: Self-pay

## 2020-10-10 ENCOUNTER — Ambulatory Visit (INDEPENDENT_AMBULATORY_CARE_PROVIDER_SITE_OTHER): Payer: Medicare Other | Admitting: Physical Therapy

## 2020-10-10 DIAGNOSIS — R293 Abnormal posture: Secondary | ICD-10-CM | POA: Diagnosis not present

## 2020-10-10 DIAGNOSIS — M6281 Muscle weakness (generalized): Secondary | ICD-10-CM | POA: Diagnosis not present

## 2020-10-10 DIAGNOSIS — R2681 Unsteadiness on feet: Secondary | ICD-10-CM | POA: Diagnosis not present

## 2020-10-10 DIAGNOSIS — R2689 Other abnormalities of gait and mobility: Secondary | ICD-10-CM

## 2020-10-10 NOTE — Therapy (Signed)
[facility] [place], [place], [address] Phone: [phone]   Fax:  [phone]  Physical Therapy Treatment & 10th Visit Progress Note  Patient Details  Name: Shelley [name] MRN: [id number] Date of Birth: 1950-04-17 Referring Provider (PT): Sallee Lange, MD   Encounter Date: 10/10/2020  Progress Note Reporting Period 09/03/2020 to 10/10/2020  See note below for Objective Data and Assessment of Progress/Goals.       PT End of Session - 10/10/20 0844     Visit Number 32    Number of Visits 10    Date for PT Re-Evaluation 11/21/20    Authorization Type Medicare & BCBS supp    Progress Note Due on Visit 80    PT Start Time 0844    PT Stop Time 0930    PT Time Calculation (min) 46 min    Equipment Utilized During Treatment Gait belt    Activity Tolerance Patient tolerated treatment well    Behavior During Therapy WFL for tasks assessed/performed             Past Medical History:  Diagnosis Date   Diabetes ([place])    Diabetes mellitus without complication ([place])    End stage renal disease ([place])    HTN (hypertension)    Hyperlipidemia    Hypertension     Past Surgical History:  Procedure Laterality Date   ABDOMINAL HYSTERECTOMY     fibroids/complete hysterec 2000   APPENDECTOMY     CATARACT EXTRACTION Bilateral    COLONOSCOPY N/A 03/12/2016   Procedure: COLONOSCOPY;  Surgeon: Daneil Dolin, MD;  [facility];  Service: Endoscopy;  Laterality: N/A;  9:30 am   POLYPECTOMY  03/12/2016   Procedure: POLYPECTOMY;  Surgeon: Daneil Dolin, MD;  [facility];  Service: Endoscopy;;  colon   YAG LASER APPLICATION Left 48/0/1655   Procedure: YAG LASER APPLICATION;  Surgeon: Williams Che, MD;  Location: [facility];  Service: Ophthalmology;  Laterality: Left;    There were no vitals filed for this visit.   Subjective Assessment - 10/10/20 0844     Subjective She had 2 appts at [facility] to test her kidneys  which are good & no return of cancer noted.  She is tired from amount of walking even though she used transport services for longer part of it.  Prosthesist changed socket & it feels better.    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 No/denies    Pain Onset In the past 7 days                               [facility] Adult PT Treatment/Exercise - 10/10/20 0845       Ambulation/Gait   Ambulation/Gait Yes    Ambulation/Gait Assistance 5: Supervision    Ambulation/Gait Assistance Details worked on cognitive task of A to Z naming while ambulating & scanning.    Ambulation Distance (Feet) 200 Feet   200' X 2   Assistive device Prosthesis;None      Neuro Re-ed    Neuro Re-ed Details  --      Knee/Hip Exercises: Machines for Strengthening   Cybex Leg Press shuttle leg press 137# 15 reps 2 sets 1st set back 45* &  2nd set back flat, lifting RLE off plate with LLE ext hold for stance control.      Knee/Hip Exercises: Standing   Rocker Board 1 minute   ant/level/post & right/level/left   Rocker Board Limitations BUE support, round board w/single pivot point,  visual (mirror), tactile & verbal cues on pelvic motion with goal to facilitate hip strategy      Prosthetics   Prosthetic Care Comments  --    Current prosthetic wear tolerance (days/week)  daily    Current prosthetic wear tolerance (#hours/day)  most of awake hours    Residual limb condition  horizontal linear wound at proximal lateral socket area.    Education Provided Other (comment)   see prosthetic care comments                Balance Exercises - 10/10/20 0845       Balance Exercises: Standing   Standing Eyes Opened Wide (BOA);Foam/compliant surface;5 reps;Head turns    Standing Eyes Opened  Limitations tactile cues for balance reactions, head turns 4 ways (right/left, up/down & diagonals)    Standing Eyes Closed Wide (BOA);Head turns;5 reps;Solid surface    Standing Eyes Closed Limitations tactile cues for balance reactions, head turns 4 ways (right/left, up/down & diagonals)    Stepping Strategy Anterior;Posterior;5 reps    Stepping Strategy Limitations anticipatory, stepping off & catching balance,  improved with RLE after 3 reps to able to stabilize without touching but with LLE she still requires touch to stabilize               PT Education - 10/10/20 0928     Education Details HEP for balance reactions - see pt instructions    Person(s) Educated Patient    Methods Explanation;Demonstration;Tactile cues;Verbal cues;Handout    Comprehension Verbalized understanding;Returned demonstration;Verbal cues required;Tactile cues required;Need further instruction              PT Short Term Goals - 09/26/20 0959       PT SHORT TERM GOAL #1   Title Patient demo & verbalize how to donne new socket with suction suspension.    Time 4    Period Weeks    Status New    Target Date 10/24/20      PT SHORT TERM GOAL #2   Title Functional Gait Assessment >/= 15 / 30    Time 4    Period Weeks    Status New    Target Date 10/24/20      PT SHORT TERM GOAL #3   Title Patient ambulates 400' with prosthesis only with supervision.    Time 4    Period Weeks    Status Revised    Target Date 10/24/20      PT SHORT TERM GOAL #4   Title Patient negotiates stairs alternating pattern with single rail with supervision.    Time 4    Period Weeks    Status New    Target Date 10/24/20               PT Long Term Goals - 09/18/20 0756       PT LONG TERM GOAL #1   Title Patient tolerates wear of  prosthesis with new socket design >90% of awake hours without skin or limb pain issues and verbalizes proper prosthetic care.    Time 12    Period Weeks    Status On-going     Target Date 11/22/20      PT LONG  TERM GOAL #2   Title Functional Gait Assessment > 19/30 with prosthesis only to indicate lower fall risk.    Time 12    Period Weeks    Status On-going    Target Date 11/22/20      PT LONG TERM GOAL #3   Title Timed Up & Go Cognitive <15 sec with no delay in cognitive task.    Time 12    Period Weeks    Status On-going    Target Date 11/22/20      PT LONG TERM GOAL #4   Title Patient ambulates >500' with prosthesis only and negotiates ramps/curbs/stairs single rail modified independent.    Time 12    Period Weeks    Status On-going    Target Date 11/22/20      PT LONG TERM GOAL #5   Title Patient verbalizes & demonstrates understanding of how to properly use fitness equipment to return to gym per her goal.    Time 12    Period Weeks    Status On-going    Target Date 11/22/20                   Plan - 10/10/20 0844     Clinical Impression Statement PT added corner balance exercises to HEP which she appears to understand.  Prosthetist trimmed flexible inner liner proximal walls which appears to decrease pressure & wound issues.  Patient is on target to meet STGs.  She ie making excellent progress towards functioning at community level with prostheis only.    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, Thoracotomy with lobectomy 02/24/2018,    Examination-Activity Limitations Locomotion Level;Lift;Squat;Stairs;Stand;Transfers    Examination-Participation Restrictions Community Activity    Stability/Clinical Decision Making Evolving/Moderate complexity    Rehab Potential Good    PT Frequency 2x / week    PT Duration 12 weeks    PT Treatment/Interventions ADLs/Self Care Home Management;DME Instruction;Gait training;Stair training;Functional mobility training;Therapeutic activities;Therapeutic  exercise;Balance training;Neuromuscular re-education;Patient/family education;Prosthetic Training    PT Next Visit Plan work towards updated STGs.    Consulted and Agree with Plan of Care Patient             Patient will benefit from skilled therapeutic intervention in order to improve the following deficits and impairments:  Abnormal gait, Decreased activity tolerance, Decreased balance, Decreased endurance, Decreased knowledge of use of DME, Decreased mobility, Decreased range of motion, Increased edema, Postural dysfunction, Prosthetic Dependency  Visit Diagnosis: Muscle weakness (generalized)  Other abnormalities of gait and mobility  Unsteadiness on feet  Abnormal posture     Problem List Patient Active Problem List   Diagnosis Date Noted   Uncontrolled type 2 diabetes mellitus with hyperglycemia, with long-term current use of insulin ([place]) 08/27/2017   Hyperlipidemia associated with type 2 diabetes mellitus ([place]) 03/18/2017   Chronic renal disease, stage V ([place]) 08/20/2015   Personal history of noncompliance with medical treatment, presenting hazards to health 08/20/2015   Proteinuria 07/04/2014   Uncontrolled type 2 diabetes mellitus with ESRD (end-stage renal disease) ([place]) 05/21/2014   Hyperlipidemia 05/21/2014   HTN (hypertension) 05/21/2014    Jamey Reas, PT, DPT 10/10/2020, 2:06 PM  [facility] Physical Therapy [address] [place], [place], [address] Phone: [phone]   Fax:  [phone]  Name: Shelley [name] MRN: [id number] Date of Birth: 11/04/1950

## 2020-10-10 NOTE — Patient Instructions (Signed)
Stand in corner with chair back in front of you: Stand on pillow or foam.  Keep your eyes open.  Move your head 5-10 times for 4 directions. (+ right/left, up/down, X diagonals up-right to down-left and up-left to down-right) Stand on floor with eyes closed. Move your head 5-10 times for 4 directions. (+ right/left, up/down, X diagonals up-right to down-left and up-left to down-right) Stand on rolled up towel. Let go & step forward trying to catch balance without touching. Try to hold position for 3-5 seconds then step back on towel roll. Repeat with other leg.  Step off 5-10 times per leg. Repeat exercise #3 but stepping backwards.

## 2020-10-15 ENCOUNTER — Ambulatory Visit (INDEPENDENT_AMBULATORY_CARE_PROVIDER_SITE_OTHER): Payer: Medicare Other | Admitting: Physical Therapy

## 2020-10-15 ENCOUNTER — Encounter: Payer: Self-pay | Admitting: Physical Therapy

## 2020-10-15 ENCOUNTER — Other Ambulatory Visit: Payer: Self-pay

## 2020-10-15 DIAGNOSIS — R2681 Unsteadiness on feet: Secondary | ICD-10-CM

## 2020-10-15 DIAGNOSIS — R2689 Other abnormalities of gait and mobility: Secondary | ICD-10-CM

## 2020-10-15 DIAGNOSIS — M6281 Muscle weakness (generalized): Secondary | ICD-10-CM

## 2020-10-15 DIAGNOSIS — R293 Abnormal posture: Secondary | ICD-10-CM | POA: Diagnosis not present

## 2020-10-15 NOTE — Therapy (Signed)
[facility] Physical Therapy [address] [place], [place], [address] Phone: [phone]   Fax:  [phone]  Physical Therapy Treatment  Patient Details  Name: Shelley [name] MRN: [id number] Date of Birth: 11-05-1950 Referring Provider (PT): Sallee Lange, MD   Encounter Date: 10/15/2020   PT End of Session - 10/15/20 0759     Visit Number 33    Number of Visits 43    Date for PT Re-Evaluation 11/21/20    Authorization Type Medicare & BCBS supp    Progress Note Due on Visit 30    PT Start Time 0759    PT Stop Time 0846    PT Time Calculation (min) 47 min    Equipment Utilized During Treatment Gait belt    Activity Tolerance Patient tolerated treatment well    Behavior During Therapy [facility] for tasks assessed/performed             Past Medical History:  Diagnosis Date   Diabetes ([place])    Diabetes mellitus without complication ([place])    End stage renal disease ([place])    HTN (hypertension)    Hyperlipidemia    Hypertension     Past Surgical History:  Procedure Laterality Date   ABDOMINAL HYSTERECTOMY     fibroids/complete hysterec 2000   APPENDECTOMY     CATARACT EXTRACTION Bilateral    COLONOSCOPY N/A 03/12/2016   Procedure: COLONOSCOPY;  Surgeon: Daneil Dolin, MD;  [facility];  Service: Endoscopy;  Laterality: N/A;  9:30 am   POLYPECTOMY  03/12/2016   Procedure: POLYPECTOMY;  Surgeon: Daneil Dolin, MD;  [facility];  Service: Endoscopy;;  colon   YAG LASER APPLICATION Left 16/0/7371   Procedure: YAG LASER APPLICATION;  Surgeon: Williams Che, MD;  Location: [facility];  Service: Ophthalmology;  Laterality: Left;    There were no vitals filed for this visit.   Subjective Assessment - 10/15/20 0800     Subjective Her limb has been itching & warm water does help.    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 No/denies    Pain Onset In the past 7 days                               [facility] Adult PT Treatment/Exercise - 10/15/20 0800       Ambulation/Gait   Ambulation/Gait Yes    Ambulation/Gait Assistance 5: Supervision    Ambulation Distance (Feet) 200 Feet   200' X 2   Assistive device Prosthesis;None      Knee/Hip Exercises: Stretches   Active Hamstring Stretch Both;2 reps;30 seconds    Active Hamstring Stretch Limitations supine knee extended with strap DF      Knee/Hip Exercises: Machines for Strengthening   Cybex Leg Press shuttle leg press 137# 15 reps 2 sets 1st set back 45* & 2nd set back flat, lifting RLE off plate with LLE ext hold for stance control.      Knee/Hip Exercises: Standing   Rocker Board --    Rocker Board Limitations --      Prosthetics   Prosthetic Care Comments  PT recommended washing limb at night & applying antiperspirant (6 nights/wk secret & 1 night/wk Sweat Block),  in mornings wash limb &  apply hydrocortisone cream and use cut off sock proximal to knee under interface liner to address itching issue.  Use Vivewear cutoff shrinker against skin under interface liner distal to knee until wound heals.    Current prosthetic wear tolerance (days/week)  daily    Current prosthetic wear tolerance (#hours/day)  most of awake hours    Residual limb condition  horizontal linear wound at proximal lateral socket area has healed. Now has small blisters at proximal posterior area of interface liner typical with sweat rising up inside liner.  Also has ~0.5cm wound distal lateral limb that she reports is from scratching.    Education Provided Other (comment);Skin check;Residual limb care   see prosthetic care comments   Person(s) Educated Patient    Education Method Explanation;Demonstration;Tactile cues;Verbal cues    Education Method Verbalized  understanding;Tactile cues required;Verbal cues required;Needs further instruction                 Balance Exercises - 10/15/20 0800       Balance Exercises: Standing   Standing Eyes Opened Wide (BOA);Foam/compliant surface;5 reps;Head turns   crossways on foam beam   Standing Eyes Opened Limitations tactile cues for balance reactions, head turns 4 ways (right/left, up/down & diagonals)    Standing Eyes Closed Head turns;5 reps;Solid surface;Narrow base of support (BOS)   progressed to feet together as feet apart was not challenging her.   Standing Eyes Closed Limitations tactile cues for balance reactions, head turns 4 ways (right/left, up/down & diagonals)    Stepping Strategy Anterior;Posterior;5 reps    Stepping Strategy Limitations anticipatory, stepping off & catching balance,  improved with RLE after 3 reps to able to stabilize without touching but with LLE she still requires touch to stabilize.  progressed to reactionary but she had to touch bar to stabilize.                 PT Short Term Goals - 09/26/20 0959       PT SHORT TERM GOAL #1   Title Patient demo & verbalize how to donne new socket with suction suspension.    Time 4    Period Weeks    Status New    Target Date 10/24/20      PT SHORT TERM GOAL #2   Title Functional Gait Assessment >/= 15 / 30    Time 4    Period Weeks    Status New    Target Date 10/24/20      PT SHORT TERM GOAL #3   Title Patient ambulates 400' with prosthesis only with supervision.    Time 4    Period Weeks    Status Revised    Target Date 10/24/20      PT SHORT TERM GOAL #4   Title Patient negotiates stairs alternating pattern with single rail with supervision.    Time 4    Period Weeks    Status New    Target Date 10/24/20               PT Long Term Goals - 09/18/20 0756       PT LONG TERM GOAL #1   Title Patient tolerates wear of  prosthesis with new socket design >90% of awake hours without skin or limb  pain issues and verbalizes proper prosthetic care.    Time 12    Period Weeks    Status On-going    Target Date 11/22/20      PT LONG TERM  GOAL #2   Title Functional Gait Assessment > 19/30 with prosthesis only to indicate lower fall risk.    Time 12    Period Weeks    Status On-going    Target Date 11/22/20      PT LONG TERM GOAL #3   Title Timed Up & Go Cognitive <15 sec with no delay in cognitive task.    Time 12    Period Weeks    Status On-going    Target Date 11/22/20      PT LONG TERM GOAL #4   Title Patient ambulates >500' with prosthesis only and negotiates ramps/curbs/stairs single rail modified independent.    Time 12    Period Weeks    Status On-going    Target Date 11/22/20      PT LONG TERM GOAL #5   Title Patient verbalizes & demonstrates understanding of how to properly use fitness equipment to return to gym per her goal.    Time 12    Period Weeks    Status On-going    Target Date 11/22/20                   Plan - 10/15/20 0759     Clinical Impression Statement PT instructed patient in addressing itching associated with sweating in new system. She appears to understand PT recommendations.  PT worked on balance activities progressing difficulty.    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, Thoracotomy with lobectomy 02/24/2018,    Examination-Activity Limitations Locomotion Level;Lift;Squat;Stairs;Stand;Transfers    Examination-Participation Restrictions Community Activity    Stability/Clinical Decision Making Evolving/Moderate complexity    Rehab Potential Good    PT Frequency 2x / week    PT Duration 12 weeks    PT Treatment/Interventions ADLs/Self Care Home Management;DME Instruction;Gait training;Stair training;Functional mobility training;Therapeutic activities;Therapeutic exercise;Balance  training;Neuromuscular re-education;Patient/family education;Prosthetic Training    PT Next Visit Plan work towards updated STGs.    Consulted and Agree with Plan of Care Patient             Patient will benefit from skilled therapeutic intervention in order to improve the following deficits and impairments:  Abnormal gait, Decreased activity tolerance, Decreased balance, Decreased endurance, Decreased knowledge of use of DME, Decreased mobility, Decreased range of motion, Increased edema, Postural dysfunction, Prosthetic Dependency  Visit Diagnosis: Muscle weakness (generalized)  Other abnormalities of gait and mobility  Unsteadiness on feet  Abnormal posture     Problem List Patient Active Problem List   Diagnosis Date Noted   Uncontrolled type 2 diabetes mellitus with hyperglycemia, with long-term current use of insulin ([place]) 08/27/2017   Hyperlipidemia associated with type 2 diabetes mellitus ([place]) 03/18/2017   Chronic renal disease, stage V ([place]) 08/20/2015   Personal history of noncompliance with medical treatment, presenting hazards to health 08/20/2015   Proteinuria 07/04/2014   Uncontrolled type 2 diabetes mellitus with ESRD (end-stage renal disease) ([place]) 05/21/2014   Hyperlipidemia 05/21/2014   HTN (hypertension) 05/21/2014    Jamey Reas, PT, DPT 10/15/2020, 9:15 AM  [facility] Physical Therapy [address] [place], [place], [address] Phone: [phone]   Fax:  [phone]  Name: Shelley [name] MRN: [id number] Date of Birth: October 17, 1950

## 2020-10-17 ENCOUNTER — Ambulatory Visit (INDEPENDENT_AMBULATORY_CARE_PROVIDER_SITE_OTHER): Payer: Medicare Other | Admitting: Physical Therapy

## 2020-10-17 ENCOUNTER — Encounter: Payer: Self-pay | Admitting: Physical Therapy

## 2020-10-17 ENCOUNTER — Other Ambulatory Visit: Payer: Self-pay

## 2020-10-17 DIAGNOSIS — R293 Abnormal posture: Secondary | ICD-10-CM

## 2020-10-17 DIAGNOSIS — R2681 Unsteadiness on feet: Secondary | ICD-10-CM

## 2020-10-17 DIAGNOSIS — R2689 Other abnormalities of gait and mobility: Secondary | ICD-10-CM | POA: Diagnosis not present

## 2020-10-17 DIAGNOSIS — M6281 Muscle weakness (generalized): Secondary | ICD-10-CM | POA: Diagnosis not present

## 2020-10-17 NOTE — Therapy (Signed)
[facility] [address] [place], [place], [address] Phone: [phone]   Fax:  [phone]  Physical Therapy Treatment  Patient Details  Name: Shelley [name] MRN: [id number] Date of Birth: Oct 05, 1950 Referring Provider (PT): Sallee Lange, MD   Encounter Date: 10/17/2020   PT End of Session - 10/17/20 0844     Visit Number 34    Number of Visits 68    Date for PT Re-Evaluation 11/21/20    Authorization Type Medicare & BCBS supp    Progress Note Due on Visit 53    PT Start Time 0844    PT Stop Time 0926    PT Time Calculation (min) 42 min    Equipment Utilized During Treatment Gait belt    Activity Tolerance Patient tolerated treatment well    Behavior During Therapy [facility] for tasks assessed/performed             Past Medical History:  Diagnosis Date   Diabetes ([place])    Diabetes mellitus without complication ([place])    End stage renal disease ([place])    HTN (hypertension)    Hyperlipidemia    Hypertension     Past Surgical History:  Procedure Laterality Date   ABDOMINAL HYSTERECTOMY     fibroids/complete hysterec 2000   APPENDECTOMY     CATARACT EXTRACTION Bilateral    COLONOSCOPY N/A 03/12/2016   Procedure: COLONOSCOPY;  Surgeon: Daneil Dolin, MD;  [facility];  Service: Endoscopy;  Laterality: N/A;  9:30 am   POLYPECTOMY  03/12/2016   Procedure: POLYPECTOMY;  Surgeon: Daneil Dolin, MD;  [facility];  Service: Endoscopy;;  colon   YAG LASER APPLICATION Left 35/06/5623   Procedure: YAG LASER APPLICATION;  Surgeon: Williams Che, MD;  Location: [facility];  Service: Ophthalmology;  Laterality: Left;    There were no vitals filed for this visit.   Subjective Assessment - 10/17/20 0844     Subjective She has been doing her exercises.  She is walking on treadmill 2x/wk 3 sets of 3 min.    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 No/denies    Pain Onset In the past 7 days                               [facility] Adult PT Treatment/Exercise - 10/17/20 0845       Ambulation/Gait   Ambulation/Gait Yes    Ambulation/Gait Assistance 5: Supervision    Ambulation Distance (Feet) 200 Feet   200' X 2   Assistive device Prosthesis;None      High Level Balance   High Level Balance Activities Braiding    High Level Balance Comments BUE support, demo & verbal cues on technique      Knee/Hip Exercises: Stretches   Active Hamstring Stretch Both;2 reps;30 seconds    Active Hamstring Stretch Limitations supine knee extended with strap DF      Knee/Hip Exercises: Machines for Strengthening   Cybex Leg Press shuttle leg press 137# 15 reps 2 sets 1st set back 45* & 2nd set back flat, lifting RLE off plate with LLE ext hold for stance control.      Prosthetics   Prosthetic Care Comments  --  Current prosthetic wear tolerance (days/week)  daily    Current prosthetic wear tolerance (#hours/day)  most of awake hours    Residual limb condition  horizontal linear wound at proximal lateral socket area has healed. Now has small blisters at proximal posterior area of interface liner typical with sweat rising up inside liner.  Also has ~0.5cm wound distal lateral limb that she reports is from scratching.    Education Provided Other (comment);Skin check;Residual limb care   see prosthetic care comments                Balance Exercises - 10/17/20 0845       Balance Exercises: Standing   Standing Eyes Opened Wide (BOA);Foam/compliant surface;5 reps;Head turns;Solid surface;Narrow base of support (BOS)   crossways on foam beam wide stance,  narrow stance solid surface   Standing Eyes Opened Limitations tactile cues for balance reactions, head turns 4 ways (right/left, up/down  & diagonals)    Standing Eyes Closed Head turns;5 reps;Solid surface;Narrow base of support (BOS)   progressed to feet close as feet apart was not challenging her.   Standing Eyes Closed Limitations tactile cues for balance reactions, head turns 4 ways (right/left, up/down & diagonals)    Tandem Stance Intermittent upper extremity support;1 rep    Tandem Stance Time counting to 30 stopping count when touching    Stepping Strategy Anterior;Posterior;5 reps    Stepping Strategy Limitations anticipatory, stepping off & catching balance,  improved with RLE after 3 reps to able to stabilize without touching but with LLE she still requires touch to stabilize.  progressed to reactionary but she had to touch bar to stabilize.                 PT Short Term Goals - 09/26/20 0959       PT SHORT TERM GOAL #1   Title Patient demo & verbalize how to donne new socket with suction suspension.    Time 4    Period Weeks    Status New    Target Date 10/24/20      PT SHORT TERM GOAL #2   Title Functional Gait Assessment >/= 15 / 30    Time 4    Period Weeks    Status New    Target Date 10/24/20      PT SHORT TERM GOAL #3   Title Patient ambulates 400' with prosthesis only with supervision.    Time 4    Period Weeks    Status Revised    Target Date 10/24/20      PT SHORT TERM GOAL #4   Title Patient negotiates stairs alternating pattern with single rail with supervision.    Time 4    Period Weeks    Status New    Target Date 10/24/20               PT Long Term Goals - 09/18/20 0756       PT LONG TERM GOAL #1   Title Patient tolerates wear of  prosthesis with new socket design >90% of awake hours without skin or limb pain issues and verbalizes proper prosthetic care.    Time 12    Period Weeks    Status On-going    Target Date 11/22/20      PT LONG TERM GOAL #2   Title Functional Gait Assessment > 19/30 with prosthesis only to indicate lower fall risk.    Time 12     Period Weeks  Status On-going    Target Date 11/22/20      PT LONG TERM GOAL #3   Title Timed Up & Go Cognitive <15 sec with no delay in cognitive task.    Time 12    Period Weeks    Status On-going    Target Date 11/22/20      PT LONG TERM GOAL #4   Title Patient ambulates >500' with prosthesis only and negotiates ramps/curbs/stairs single rail modified independent.    Time 12    Period Weeks    Status On-going    Target Date 11/22/20      PT LONG TERM GOAL #5   Title Patient verbalizes & demonstrates understanding of how to properly use fitness equipment to return to gym per her goal.    Time 12    Period Weeks    Status On-going    Target Date 11/22/20                   Plan - 10/17/20 0844     Clinical Impression Statement PT progressed balance activities to include step strategy anticipatory & reactionary.  PT also instructed in tandem stance at home near sink & braiding.    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, Thoracotomy with lobectomy 02/24/2018,    Examination-Activity Limitations Locomotion Level;Lift;Squat;Stairs;Stand;Transfers    Examination-Participation Restrictions Community Activity    Stability/Clinical Decision Making Evolving/Moderate complexity    Rehab Potential Good    PT Frequency 2x / week    PT Duration 12 weeks    PT Treatment/Interventions ADLs/Self Care Home Management;DME Instruction;Gait training;Stair training;Functional mobility training;Therapeutic activities;Therapeutic exercise;Balance training;Neuromuscular re-education;Patient/family education;Prosthetic Training    PT Next Visit Plan check STGs next week. continue to work on balance reactions facilitating ankle/residual limb, hip & step strategies.    Consulted and Agree with Plan of Care Patient             Patient will  benefit from skilled therapeutic intervention in order to improve the following deficits and impairments:  Abnormal gait, Decreased activity tolerance, Decreased balance, Decreased endurance, Decreased knowledge of use of DME, Decreased mobility, Decreased range of motion, Increased edema, Postural dysfunction, Prosthetic Dependency  Visit Diagnosis: Muscle weakness (generalized)  Other abnormalities of gait and mobility  Unsteadiness on feet  Abnormal posture     Problem List Patient Active Problem List   Diagnosis Date Noted   Uncontrolled type 2 diabetes mellitus with hyperglycemia, with long-term current use of insulin ([place]) 08/27/2017   Hyperlipidemia associated with type 2 diabetes mellitus ([place]) 03/18/2017   Chronic renal disease, stage V ([place]) 08/20/2015   Personal history of noncompliance with medical treatment, presenting hazards to health 08/20/2015   Proteinuria 07/04/2014   Uncontrolled type 2 diabetes mellitus with ESRD (end-stage renal disease) ([place]) 05/21/2014   Hyperlipidemia 05/21/2014   HTN (hypertension) 05/21/2014    Jamey Reas, PT, DPT 10/17/2020, 11:02 AM  [facility] [address] [place], [place], [address] Phone: [phone]   Fax:  [phone]  Name: Shelley [name] MRN: [id number] Date of Birth: 05/25/1950

## 2020-10-22 ENCOUNTER — Encounter: Payer: Medicare Other | Admitting: Physical Therapy

## 2020-10-23 ENCOUNTER — Ambulatory Visit (INDEPENDENT_AMBULATORY_CARE_PROVIDER_SITE_OTHER): Payer: Medicare Other | Admitting: Physical Therapy

## 2020-10-23 ENCOUNTER — Encounter: Payer: Self-pay | Admitting: Physical Therapy

## 2020-10-23 ENCOUNTER — Other Ambulatory Visit: Payer: Self-pay

## 2020-10-23 DIAGNOSIS — M6281 Muscle weakness (generalized): Secondary | ICD-10-CM | POA: Diagnosis not present

## 2020-10-23 DIAGNOSIS — R2689 Other abnormalities of gait and mobility: Secondary | ICD-10-CM | POA: Diagnosis not present

## 2020-10-23 DIAGNOSIS — R293 Abnormal posture: Secondary | ICD-10-CM

## 2020-10-23 DIAGNOSIS — R2681 Unsteadiness on feet: Secondary | ICD-10-CM

## 2020-10-23 NOTE — Therapy (Signed)
[facility] [address] [place], [place], [address] Phone: [phone]   Fax:  [phone]  Physical Therapy Treatment  Patient Details  Name: Shelley [name] MRN: [id number] Date of Birth: 12-Apr-1950 Referring Provider (PT): Sallee Lange, MD   Encounter Date: 10/23/2020   PT End of Session - 10/23/20 0758     Visit Number 35    Number of Visits 54    Date for PT Re-Evaluation 11/21/20    Authorization Type Medicare & BCBS supp    Progress Note Due on Visit 22    PT Start Time 0759    PT Stop Time 0845    PT Time Calculation (min) 46 min    Equipment Utilized During Treatment Gait belt    Activity Tolerance Patient tolerated treatment well    Behavior During Therapy WFL for tasks assessed/performed             Past Medical History:  Diagnosis Date   Diabetes ([place])    Diabetes mellitus without complication ([place])    End stage renal disease ([place])    HTN (hypertension)    Hyperlipidemia    Hypertension     Past Surgical History:  Procedure Laterality Date   ABDOMINAL HYSTERECTOMY     fibroids/complete hysterec 2000   APPENDECTOMY     CATARACT EXTRACTION Bilateral    COLONOSCOPY N/A 03/12/2016   Procedure: COLONOSCOPY;  Surgeon: Daneil Dolin, MD;  [facility];  Service: Endoscopy;  Laterality: N/A;  9:30 am   POLYPECTOMY  03/12/2016   Procedure: POLYPECTOMY;  Surgeon: Daneil Dolin, MD;  [facility];  Service: Endoscopy;;  colon   YAG LASER APPLICATION Left 97/08/7339   Procedure: YAG LASER APPLICATION;  Surgeon: Williams Che, MD;  Location: [facility];  Service: Ophthalmology;  Laterality: Left;    There were no vitals filed for this visit.   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 No/denies    Pain Onset In the past 7 days                [facility] PT Assessment - 10/23/20 0800       Assessment   Medical Diagnosis Left Transtibial Amputation    Referring Provider (PT) Sallee Lange, MD      Ambulation/Gait   Ambulation Distance (Feet) 400 Feet   300' & 400'   Gait velocity prosthesis only 3.11 ft/sec comfortable & 4.85 ft/sec fast   6/8 was with cane 2.95 ft/sec comfortable & 4.08 ft/sec fast pace;  without device except prosthesis 2.80 ft/sec comfortable & 3.98 ft/sec fast pace     Functional Gait  Assessment   Gait assessed  Yes    Gait Level Surface Walks 20 ft in less than 7 sec but greater than 5.5 sec, uses assistive device, slower speed, mild gait deviations, or deviates 6-10 in outside of the 12 in walkway width.    Change in Gait Speed Able to change speed, demonstrates mild gait deviations, deviates 6-10 in outside of the 12 in walkway width, or no gait deviations, unable to achieve a major change in velocity, or uses a change in velocity, or uses an assistive device.  Gait with Horizontal Head Turns Performs head turns smoothly with slight change in gait velocity (eg, minor disruption to smooth gait path), deviates 6-10 in outside 12 in walkway width, or uses an assistive device.    Gait with Vertical Head Turns Performs task with slight change in gait velocity (eg, minor disruption to smooth gait path), deviates 6 - 10 in outside 12 in walkway width or uses assistive device    Gait and Pivot Turn Pivot turns safely in greater than 3 sec and stops with no loss of balance, or pivot turns safely within 3 sec and stops with mild imbalance, requires small steps to catch balance.    Step Over Obstacle Is able to step over one shoe box (4.5 in total height) but must slow down and adjust steps to clear box safely. May require verbal cueing.    Gait with Narrow Base of Support  Ambulates 4-7 steps.    Gait with Eyes Closed Walks 20 ft, uses assistive device, slower speed, mild gait deviations, deviates 6-10 in outside 12 in walkway width. Ambulates 20 ft in less than 9 sec but greater than 7 sec.    Ambulating Backwards Walks 20 ft, uses assistive device, slower speed, mild gait deviations, deviates 6-10 in outside 12 in walkway width.    Steps Alternating feet, must use rail.    Total Score 18                           [facility] Adult PT Treatment/Exercise - 10/23/20 0800       Ambulation/Gait   Ambulation/Gait Yes    Ambulation/Gait Assistance 5: Supervision    Assistive device Prosthesis;None    Stairs Yes    Stairs Assistance 5: Supervision    Stair Management Technique One rail Left;Alternating pattern;Forwards    Number of Stairs 11    Height of Stairs 6      High Level Balance   High Level Balance Activities --    High Level Balance Comments --      Therapeutic Activites    Other Therapeutic Activities by pt request, PT demo how to perform activity in partial squat like putting tags on car.  Pt return demo & verbalized understanding.      Knee/Hip Exercises: Stretches   Active Hamstring Stretch Both;2 reps;30 seconds    Active Hamstring Stretch Limitations supine knee extended with strap DF      Knee/Hip Exercises: Machines for Strengthening   Cybex Leg Press shuttle leg press 143# 15 reps 2 sets 1st set back 45* & 2nd set back flat, lifting RLE off plate with LLE ext hold for stance control.      Prosthetics   Prosthetic Care Comments  PT instructed in use of water prosthesis for showering & pool. PT also instructed in travel tips.    Current prosthetic wear tolerance (days/week)  daily    Current prosthetic wear tolerance (#hours/day)  most of awake hours    Residual limb condition  horizontal linear wound at proximal lateral socket area has healed. Now has small blisters at proximal posterior area of interface liner typical with  sweat rising up inside liner.  Also has ~0.5cm wound distal lateral limb that she reports is from scratching.    Education Provided Other (comment)   see prosthetic care comments   Person(s) Educated Patient    Education Method Explanation;Verbal cues    Education Method Verbalized understanding;Verbal cues required;Needs further instruction  PT Short Term Goals - 10/23/20 2952       PT SHORT TERM GOAL #1   Title Patient demo & verbalize how to donne new socket with suction suspension.    Time 4    Period Weeks    Status Achieved    Target Date 10/24/20      PT SHORT TERM GOAL #2   Title Functional Gait Assessment >/= 15 / 30    Time 4    Period Weeks    Status Achieved    Target Date 10/24/20      PT SHORT TERM GOAL #3   Title Patient ambulates 400' with prosthesis only with supervision.    Time 4    Period Weeks    Status Achieved    Target Date 10/24/20      PT SHORT TERM GOAL #4   Title Patient negotiates stairs alternating pattern with single rail with supervision.    Time 4    Period Weeks    Status Achieved    Target Date 10/24/20               PT Long Term Goals - 09/18/20 0756       PT LONG TERM GOAL #1   Title Patient tolerates wear of  prosthesis with new socket design >90% of awake hours without skin or limb pain issues and verbalizes proper prosthetic care.    Time 12    Period Weeks    Status On-going    Target Date 11/22/20      PT LONG TERM GOAL #2   Title Functional Gait Assessment > 19/30 with prosthesis only to indicate lower fall risk.    Time 12    Period Weeks    Status On-going    Target Date 11/22/20      PT LONG TERM GOAL #3   Title Timed Up & Go Cognitive <15 sec with no delay in cognitive task.    Time 12    Period Weeks    Status On-going    Target Date 11/22/20      PT LONG TERM GOAL #4   Title Patient ambulates >500' with prosthesis only and negotiates ramps/curbs/stairs single rail  modified independent.    Time 12    Period Weeks    Status On-going    Target Date 11/22/20      PT LONG TERM GOAL #5   Title Patient verbalizes & demonstrates understanding of how to properly use fitness equipment to return to gym per her goal.    Time 12    Period Weeks    Status On-going    Target Date 11/22/20                   Plan - 10/23/20 0759     Clinical Impression Statement Patient met all STGs for last 30-days.  Her functional gait assessment improved to 18/30 but still indicates fall risk.   PT reviewed walking program to improve activity tolerance & maximal walking distance and she appears to understand.    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, Thoracotomy with lobectomy 02/24/2018,    Examination-Activity Limitations Locomotion Level;Lift;Squat;Stairs;Stand;Transfers    Examination-Participation Restrictions Community Activity    Stability/Clinical Decision Making Evolving/Moderate complexity    Rehab Potential Good    PT Frequency 2x / week    PT Duration 12 weeks  PT Treatment/Interventions ADLs/Self Care Home Management;DME Instruction;Gait training;Stair training;Functional mobility training;Therapeutic activities;Therapeutic exercise;Balance training;Neuromuscular re-education;Patient/family education;Prosthetic Training    PT Next Visit Plan work on balance reactions, therapeutic exercise including endurance    Consulted and Agree with Plan of Care Patient             Patient will benefit from skilled therapeutic intervention in order to improve the following deficits and impairments:  Abnormal gait, Decreased activity tolerance, Decreased balance, Decreased endurance, Decreased knowledge of use of DME, Decreased mobility, Decreased range of motion, Increased edema, Postural dysfunction, Prosthetic  Dependency  Visit Diagnosis: Muscle weakness (generalized)  Other abnormalities of gait and mobility  Unsteadiness on feet  Abnormal posture     Problem List Patient Active Problem List   Diagnosis Date Noted   Uncontrolled type 2 diabetes mellitus with hyperglycemia, with long-term current use of insulin ([place]) 08/27/2017   Hyperlipidemia associated with type 2 diabetes mellitus ([place]) 03/18/2017   Chronic renal disease, stage V ([place]) 08/20/2015   Personal history of noncompliance with medical treatment, presenting hazards to health 08/20/2015   Proteinuria 07/04/2014   Uncontrolled type 2 diabetes mellitus with ESRD (end-stage renal disease) ([place]) 05/21/2014   Hyperlipidemia 05/21/2014   HTN (hypertension) 05/21/2014    Jamey Reas, PT, DPT 10/23/2020, 9:30 AM  [facility] [address] [place], [place], [address] Phone: [phone]   Fax:  [phone]  Name: Shelley [name] MRN: [id number] Date of Birth: [birth year]

## 2020-10-24 ENCOUNTER — Encounter: Payer: Self-pay | Admitting: Physical Therapy

## 2020-10-24 ENCOUNTER — Ambulatory Visit (INDEPENDENT_AMBULATORY_CARE_PROVIDER_SITE_OTHER): Payer: Medicare Other | Admitting: Physical Therapy

## 2020-10-24 DIAGNOSIS — M6281 Muscle weakness (generalized): Secondary | ICD-10-CM

## 2020-10-24 DIAGNOSIS — R2681 Unsteadiness on feet: Secondary | ICD-10-CM

## 2020-10-24 DIAGNOSIS — R2689 Other abnormalities of gait and mobility: Secondary | ICD-10-CM | POA: Diagnosis not present

## 2020-10-24 DIAGNOSIS — R293 Abnormal posture: Secondary | ICD-10-CM | POA: Diagnosis not present

## 2020-10-24 NOTE — Therapy (Signed)
[facility] Physical Therapy [address] [place], [place], [address] Phone: [phone]   Fax:  [phone]  Physical Therapy Treatment  Patient Details  Name: Shelley [name] MRN: [id number] Date of Birth: 24-Dec-1950 Referring Provider (PT): Sallee Lange, MD   Encounter Date: 10/24/2020   PT End of Session - 10/24/20 0758     Visit Number 36    Number of Visits 81    Date for PT Re-Evaluation 11/21/20    Authorization Type Medicare & BCBS supp    Progress Note Due on Visit 30    PT Start Time 0758    PT Stop Time 0845    PT Time Calculation (min) 47 min    Equipment Utilized During Treatment Gait belt    Activity Tolerance Patient tolerated treatment well    Behavior During Therapy WFL for tasks assessed/performed             Past Medical History:  Diagnosis Date   Diabetes ([place])    Diabetes mellitus without complication ([place])    End stage renal disease ([place])    HTN (hypertension)    Hyperlipidemia    Hypertension     Past Surgical History:  Procedure Laterality Date   ABDOMINAL HYSTERECTOMY     fibroids/complete hysterec 2000   APPENDECTOMY     CATARACT EXTRACTION Bilateral    COLONOSCOPY N/A 03/12/2016   Procedure: COLONOSCOPY;  Surgeon: Daneil Dolin, MD;  [facility];  Service: Endoscopy;  Laterality: N/A;  9:30 am   POLYPECTOMY  03/12/2016   Procedure: POLYPECTOMY;  Surgeon: Daneil Dolin, MD;  [facility];  Service: Endoscopy;;  colon   YAG LASER APPLICATION Left 38/03/173   Procedure: YAG LASER APPLICATION;  Surgeon: Williams Che, MD;  Location: [facility];  Service: Ophthalmology;  Laterality: Left;    There were no vitals filed for this visit.   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 No/denies    Pain Onset In the past 7 days                               [facility] Adult PT Treatment/Exercise - 10/24/20 0759       Ambulation/Gait   Ambulation/Gait Yes    Ambulation/Gait Assistance 5: Supervision    Ambulation Distance (Feet) 200 Feet   200' X 2   Assistive device Prosthesis;None      High Level Balance   High Level Balance Activities Braiding    High Level Balance Comments BUE support, demo & verbal cues on technique      Knee/Hip Exercises: Stretches   Active Hamstring Stretch Both;2 reps;30 seconds    Active Hamstring Stretch Limitations supine knee extended with strap DF      Knee/Hip Exercises: Aerobic   Tread Mill 2.71mph 5 min with BUE support.      Knee/Hip Exercises: Machines for Strengthening   Cybex Leg Press shuttle leg press 143# 15 reps 2 sets 1st set back 45* & 2nd set back flat, lifting RLE off plate with LLE ext hold for stance control.  Knee/Hip Exercises: Standing   Forward Step Up Both;1 set;5 reps;Hand Hold: 2;Step Height: 8"   BOSU round side up   Forward Step Up Limitations demo & verbal cues on technique    Step Down Both;5 reps;Hand Hold: 2;Step Height: 8"   BOSU round side up   Step Down Limitations demo & verbal cues on technique      Prosthetics   Prosthetic Care Comments  reviewed use of water prosthesis in pool with recommendation to try.  PT recommended taking a layer off heel wedge in right shoe to get closer to proper height of prosthesis.    Current prosthetic wear tolerance (days/week)  daily    Current prosthetic wear tolerance (#hours/day)  most of awake hours    Residual limb condition  horizontal linear wound at proximal lateral socket area has healed. Now has small blisters at proximal posterior area of interface liner typical with sweat rising up inside liner.  Also has ~0.5cm wound  distal lateral limb that she reports is from scratching.    Education Provided Other (comment);Skin check;Residual limb care   see prosthetic care comments   Person(s) Educated Patient    Education Method Explanation;Verbal cues    Education Method Verbalized understanding                 Balance Exercises - 10/24/20 0800       Balance Exercises: Standing   Tandem Stance Intermittent upper extremity support;1 rep;Foam/compliant surface;Eyes open    Tandem Stance Time counting to 30 stopping count when touching    Tandem Gait Forward;Retro;Intermittent upper extremity support;Foam/compliant surface;2 reps                 PT Short Term Goals - 10/23/20 2992       PT SHORT TERM GOAL #1   Title Patient demo & verbalize how to donne new socket with suction suspension.    Time 4    Period Weeks    Status Achieved    Target Date 10/24/20      PT SHORT TERM GOAL #2   Title Functional Gait Assessment >/= 15 / 30    Time 4    Period Weeks    Status Achieved    Target Date 10/24/20      PT SHORT TERM GOAL #3   Title Patient ambulates 400' with prosthesis only with supervision.    Time 4    Period Weeks    Status Achieved    Target Date 10/24/20      PT SHORT TERM GOAL #4   Title Patient negotiates stairs alternating pattern with single rail with supervision.    Time 4    Period Weeks    Status Achieved    Target Date 10/24/20               PT Long Term Goals - 09/18/20 0756       PT LONG TERM GOAL #1   Title Patient tolerates wear of  prosthesis with new socket design >90% of awake hours without skin or limb pain issues and verbalizes proper prosthetic care.    Time 12    Period Weeks    Status On-going    Target Date 11/22/20      PT LONG TERM GOAL #2   Title Functional Gait Assessment > 19/30 with prosthesis only to indicate lower fall risk.    Time 12    Period Weeks    Status On-going    Target  Date 11/22/20      PT LONG TERM GOAL #3    Title Timed Up & Go Cognitive <15 sec with no delay in cognitive task.    Time 12    Period Weeks    Status On-going    Target Date 11/22/20      PT LONG TERM GOAL #4   Title Patient ambulates >500' with prosthesis only and negotiates ramps/curbs/stairs single rail modified independent.    Time 12    Period Weeks    Status On-going    Target Date 11/22/20      PT LONG TERM GOAL #5   Title Patient verbalizes & demonstrates understanding of how to properly use fitness equipment to return to gym per her goal.    Time 12    Period Weeks    Status On-going    Target Date 11/22/20                   Plan - 10/24/20 0758     Clinical Impression Statement PT used treadmill to work on endurance and she was able to tolerate 5 min at 2.71mph which is an increased tolerance.  PT recommending decreasing lift in non-amputated LE to bring leg length closer to equal.  When prosthetist initiallly set prosthesis at equal height with socket revision she developed hip pain. So plan has been to slowly progress to equal height.    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, Thoracotomy with lobectomy 02/24/2018,    Examination-Activity Limitations Locomotion Level;Lift;Squat;Stairs;Stand;Transfers    Examination-Participation Restrictions Community Activity    Stability/Clinical Decision Making Evolving/Moderate complexity    Rehab Potential Good    PT Frequency 2x / week    PT Duration 12 weeks    PT Treatment/Interventions ADLs/Self Care Home Management;DME Instruction;Gait training;Stair training;Functional mobility training;Therapeutic activities;Therapeutic exercise;Balance training;Neuromuscular re-education;Patient/family education;Prosthetic Training    PT Next Visit Plan continue therapeutic exercise including endurance & balance activities     Consulted and Agree with Plan of Care Patient             Patient will benefit from skilled therapeutic intervention in order to improve the following deficits and impairments:  Abnormal gait, Decreased activity tolerance, Decreased balance, Decreased endurance, Decreased knowledge of use of DME, Decreased mobility, Decreased range of motion, Increased edema, Postural dysfunction, Prosthetic Dependency  Visit Diagnosis: Muscle weakness (generalized)  Other abnormalities of gait and mobility  Unsteadiness on feet  Abnormal posture     Problem List Patient Active Problem List   Diagnosis Date Noted   Uncontrolled type 2 diabetes mellitus with hyperglycemia, with long-term current use of insulin ([place]) 08/27/2017   Hyperlipidemia associated with type 2 diabetes mellitus ([place]) 03/18/2017   Chronic renal disease, stage V ([place]) 08/20/2015   Personal history of noncompliance with medical treatment, presenting hazards to health 08/20/2015   Proteinuria 07/04/2014   Uncontrolled type 2 diabetes mellitus with ESRD (end-stage renal disease) ([place]) 05/21/2014   Hyperlipidemia 05/21/2014   HTN (hypertension) 05/21/2014    Jamey Reas, PT, DPT 10/24/2020, 8:54 AM  [facility] Physical Therapy [address] [place], [place], [address] Phone: [phone]   Fax:  [phone]  Name: Shelley [name] MRN: [id number] Date of Birth: [birth year]

## 2020-10-28 ENCOUNTER — Encounter: Payer: Medicare Other | Admitting: Physical Therapy

## 2020-10-29 ENCOUNTER — Encounter: Payer: Self-pay | Admitting: Physical Therapy

## 2020-10-29 ENCOUNTER — Other Ambulatory Visit: Payer: Self-pay

## 2020-10-29 ENCOUNTER — Ambulatory Visit (INDEPENDENT_AMBULATORY_CARE_PROVIDER_SITE_OTHER): Payer: Medicare Other | Admitting: Physical Therapy

## 2020-10-29 DIAGNOSIS — M6281 Muscle weakness (generalized): Secondary | ICD-10-CM | POA: Diagnosis not present

## 2020-10-29 DIAGNOSIS — R293 Abnormal posture: Secondary | ICD-10-CM

## 2020-10-29 DIAGNOSIS — R2681 Unsteadiness on feet: Secondary | ICD-10-CM

## 2020-10-29 DIAGNOSIS — R2689 Other abnormalities of gait and mobility: Secondary | ICD-10-CM | POA: Diagnosis not present

## 2020-10-29 NOTE — Therapy (Signed)
[facility] Physical Therapy [address] [place], [place], [address] Phone: [phone]   Fax:  [phone]  Physical Therapy Treatment  Patient Details  Name: Shelley [name] MRN: [id number] Date of Birth: 1950/07/04 Referring Provider (PT): Sallee Lange, MD   Encounter Date: 10/29/2020   PT End of Session - 10/29/20 0804     Visit Number 37    Number of Visits 76    Date for PT Re-Evaluation 11/21/20    Authorization Type Medicare & BCBS supp    Progress Note Due on Visit 15    PT Start Time 0800    PT Stop Time 0845    PT Time Calculation (min) 45 min    Equipment Utilized During Treatment Gait belt    Activity Tolerance Patient tolerated treatment well    Behavior During Therapy WFL for tasks assessed/performed             Past Medical History:  Diagnosis Date   Diabetes ([place])    Diabetes mellitus without complication ([place])    End stage renal disease ([place])    HTN (hypertension)    Hyperlipidemia    Hypertension     Past Surgical History:  Procedure Laterality Date   ABDOMINAL HYSTERECTOMY     fibroids/complete hysterec 2000   APPENDECTOMY     CATARACT EXTRACTION Bilateral    COLONOSCOPY N/A 03/12/2016   Procedure: COLONOSCOPY;  Surgeon: Daneil Dolin, MD;  [facility];  Service: Endoscopy;  Laterality: N/A;  9:30 am   POLYPECTOMY  03/12/2016   Procedure: POLYPECTOMY;  Surgeon: Daneil Dolin, MD;  [facility];  Service: Endoscopy;;  colon   YAG LASER APPLICATION Left 76/10/1155   Procedure: YAG LASER APPLICATION;  Surgeon: Williams Che, MD;  Location: [facility];  Service: Ophthalmology;  Laterality: Left;    There were no vitals filed for this visit.   Subjective Assessment - 10/29/20 0800     Subjective She was able to visit her sister who has stairs to enter for first time. She did remove 1 layer of adjust a lift in right shoe (~1/8") and mild discomfort in left hip.    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 No/denies    Pain Onset In the past 7 days                               [facility] Adult PT Treatment/Exercise - 10/29/20 0800       Ambulation/Gait   Ambulation/Gait Yes    Ambulation/Gait Assistance 5: Supervision    Ambulation Distance (Feet) 200 Feet   200' X 2   Assistive device Prosthesis;None      High Level Balance   High Level Balance Activities --    High Level Balance Comments --      Therapeutic Activites    Other Therapeutic Activities worked on golf with putting, driving & reaching to ground to manage ball.  She return demo with no LOB. PT gave info for local golf pro who works with individuals with diasability to return to golf. She verbalized understanding.      Knee/Hip Exercises: Stretches   Active Hamstring Stretch Both;2 reps;30 seconds    Active Hamstring Stretch Limitations supine knee extended  with strap DF      Knee/Hip Exercises: Aerobic   Tread Mill 2.72mph 5 min with BUE support.      Knee/Hip Exercises: Machines for Strengthening   Cybex Leg Press shuttle leg press 143# 15 reps 2 sets 1st set back 45* & 2nd set back flat, lifting RLE off plate with LLE ext hold for stance control.      Knee/Hip Exercises: Standing   Forward Step Up Both;1 set;5 reps;Hand Hold: 2;Step Height: 8"   BOSU round side up   Forward Step Up Limitations demo & verbal cues on technique    Step Down Both;5 reps;Hand Hold: 2;Step Height: 8"   BOSU round side up   Step Down Limitations demo & verbal cues on technique      Prosthetics   Prosthetic Care Comments  continue with current single layer of adjust a lift until no discomfort for 1 week. Then remove lift completely    Current prosthetic wear tolerance (days/week)  daily    Current prosthetic wear  tolerance (#hours/day)  most of awake hours    Residual limb condition  horizontal linear wound at proximal lateral socket area has healed. Now has small blisters at proximal posterior area of interface liner typical with sweat rising up inside liner.  Also has ~0.5cm wound distal lateral limb that she reports is from scratching.    Education Provided Other (comment);Skin check;Residual limb care   see prosthetic care comments   Person(s) Educated Patient    Education Method Explanation;Verbal cues    Education Method Verbalized understanding                 Balance Exercises - 10/29/20 0800       Balance Exercises: Standing   Stepping Strategy Anterior;Posterior;Lateral;5 reps    Stepping Strategy Limitations anticipatory, stepping off & catching balance,  improved with RLE after 1st rep. PT instructed how to work on this at home near sink & pt verbalized understanding.                 PT Short Term Goals - 10/23/20 9833       PT SHORT TERM GOAL #1   Title Patient demo & verbalize how to donne new socket with suction suspension.    Time 4    Period Weeks    Status Achieved    Target Date 10/24/20      PT SHORT TERM GOAL #2   Title Functional Gait Assessment >/= 15 / 30    Time 4    Period Weeks    Status Achieved    Target Date 10/24/20      PT SHORT TERM GOAL #3   Title Patient ambulates 400' with prosthesis only with supervision.    Time 4    Period Weeks    Status Achieved    Target Date 10/24/20      PT SHORT TERM GOAL #4   Title Patient negotiates stairs alternating pattern with single rail with supervision.    Time 4    Period Weeks    Status Achieved    Target Date 10/24/20               PT Long Term Goals - 09/18/20 0756       PT LONG TERM GOAL #1   Title Patient tolerates wear of  prosthesis with new socket design >90% of awake hours without skin or limb pain issues and verbalizes proper prosthetic care.    Time  12    Period Weeks     Status On-going    Target Date 11/22/20      PT LONG TERM GOAL #2   Title Functional Gait Assessment > 19/30 with prosthesis only to indicate lower fall risk.    Time 12    Period Weeks    Status On-going    Target Date 11/22/20      PT LONG TERM GOAL #3   Title Timed Up & Go Cognitive <15 sec with no delay in cognitive task.    Time 12    Period Weeks    Status On-going    Target Date 11/22/20      PT LONG TERM GOAL #4   Title Patient ambulates >500' with prosthesis only and negotiates ramps/curbs/stairs single rail modified independent.    Time 12    Period Weeks    Status On-going    Target Date 11/22/20      PT LONG TERM GOAL #5   Title Patient verbalizes & demonstrates understanding of how to properly use fitness equipment to return to gym per her goal.    Time 12    Period Weeks    Status On-going    Target Date 11/22/20                   Plan - 10/29/20 0805     Clinical Impression Statement PT introduced basic golf skills to enable her to return to her leisure activitiy and fun way to work her balance. She was able to perform with no LOB.  Pt appeared to have less fatigue with treadmill endurance work.    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, Thoracotomy with lobectomy 02/24/2018,    Examination-Activity Limitations Locomotion Level;Lift;Squat;Stairs;Stand;Transfers    Examination-Participation Restrictions Community Activity    Stability/Clinical Decision Making Evolving/Moderate complexity    Rehab Potential Good    PT Frequency 2x / week    PT Duration 12 weeks    PT Treatment/Interventions ADLs/Self Care Home Management;DME Instruction;Gait training;Stair training;Functional mobility training;Therapeutic activities;Therapeutic exercise;Balance training;Neuromuscular re-education;Patient/family  education;Prosthetic Training    PT Next Visit Plan work towards [place], continue therapeutic exercise including endurance & balance activities    Consulted and Agree with Plan of Care Patient             Patient will benefit from skilled therapeutic intervention in order to improve the following deficits and impairments:  Abnormal gait, Decreased activity tolerance, Decreased balance, Decreased endurance, Decreased knowledge of use of DME, Decreased mobility, Decreased range of motion, Increased edema, Postural dysfunction, Prosthetic Dependency  Visit Diagnosis: Muscle weakness (generalized)  Other abnormalities of gait and mobility  Unsteadiness on feet  Abnormal posture     Problem List Patient Active Problem List   Diagnosis Date Noted   Uncontrolled type 2 diabetes mellitus with hyperglycemia, with long-term current use of insulin ([place]) 08/27/2017   Hyperlipidemia associated with type 2 diabetes mellitus ([place]) 03/18/2017   Chronic renal disease, stage V ([place]) 08/20/2015   Personal history of noncompliance with medical treatment, presenting hazards to health 08/20/2015   Proteinuria 07/04/2014   Uncontrolled type 2 diabetes mellitus with ESRD (end-stage renal disease) ([place]) 05/21/2014   Hyperlipidemia 05/21/2014   HTN (hypertension) 05/21/2014    Jamey Reas, PT, DPT 10/29/2020, 8:55 AM  [facility] Physical Therapy [address] [place], [place], [address] Phone: [phone]  Fax:  [phone]  Name: Shelley [name] MRN: [id number] Date of Birth: Nov 06, 1950

## 2020-10-31 ENCOUNTER — Encounter: Payer: Self-pay | Admitting: Physical Therapy

## 2020-10-31 ENCOUNTER — Ambulatory Visit (INDEPENDENT_AMBULATORY_CARE_PROVIDER_SITE_OTHER): Payer: Medicare Other | Admitting: Physical Therapy

## 2020-10-31 ENCOUNTER — Other Ambulatory Visit: Payer: Self-pay

## 2020-10-31 DIAGNOSIS — R2681 Unsteadiness on feet: Secondary | ICD-10-CM

## 2020-10-31 DIAGNOSIS — R293 Abnormal posture: Secondary | ICD-10-CM

## 2020-10-31 DIAGNOSIS — R2689 Other abnormalities of gait and mobility: Secondary | ICD-10-CM

## 2020-10-31 DIAGNOSIS — M6281 Muscle weakness (generalized): Secondary | ICD-10-CM | POA: Diagnosis not present

## 2020-10-31 NOTE — Patient Instructions (Addendum)
Fitness Plan has 4 components.  1. Endurance - Goal is 20-30 minutes. You can break time up between machines. You can do sets with rest between sets if need. Example 5 minutes work, 2 minutes rest for 3 sets. Recommend machines that are sitting with back support that uses both your arms and your legs at the same time. Or can do walking program & can use assistive device if increases time & quality of your walking.  Treadmill safety- step on & off machine with foot on solid portion not treadmill belt. Use safety lead so belt will stop if you get in trouble. Straddle belt. Turn treadmill on & set to desired speed while you are still straddling the belt. Step on & off belt leading with your good leg. Step off the belt first to adjust speed or stop.  2. Strength - Goal is form & control. Weight machines should have enough weight to have resistance but not so much that you have to strain or cheat. If you have to cheat (poor form), strain or the weight stack hits hard / out of control, then you probably have too much weight.   Goal is to do 15 repetitions for 1-3 sets. Start with 1 set and build up. Rest 30-60 seconds between sets.  Do 2-4 leg machines, 2-4 arm machines & 2-4 trunk machines. Build up to 4-6 leg machines & 4-6 arm machines. Look for pictures to make sure you exercise both sides of arm, leg or trunk. You want to balance out muscle groups that you working.   Leg machines like leg press (can do both legs or each leg by themselves),   At home can do theraband exercises for arms or legs, floor transfers, sit to stand to sit using arms as little as possible, Yoga positions 3.Flexibility - make sure you include arms, legs & trunk. Can do Yoga also 4. Balance- can work in corner with chair in front - head turns, arm motions, eyes closed; place one foot inside cabinet or on 4-6" block to work on one-legged stance,   Try to get to each component 3-5 times per week.  Going to [facility] or fitness center you  want do things you can not do at home.  For example use bikes at [facility] if you don't have one at home. Then on days you don't go to [facility], do exercises at home. Having 2 or more programs like one program for [facility] & one program for home / days that you do not go to [facility].   Recommendations are at least 150 minutes a week of moderate intensity exercise. Try not to go more than 2 days in a row without being active for at least 30 minutes a day. Any activity where you are up and moving is good- Walking, bicycling, stationary bicycling, dancing.  (moderate intensity means to get  a little out of breath). To start you may not tolerate moderate but if it is challenging to you then it is helping.    Do resistance exercise at least 2 times a week.  This can be yoga poses or strength training where you lift your own weight (think leg lifts or toe raises)  or light weights like cans of beans with your arms.    Flexibility and balance exercise- safe stretching and practicing balance is very important to health.      Stand between sink on left side & chair back on right side. A..Step left leg forward hold 2-3 seconds,  then return beside right leg.  B. Step left leg backward hold 2-3 seconds, then return beside right leg.  C. Step left leg out to side hold 2-3 seconds, then return beside right leg.  D. Step left leg aross right leg, hold 2-3 seconds, then return beside right leg. 2.Repeat stepping right leg 4 directions. Alternate legs.

## 2020-10-31 NOTE — Therapy (Signed)
[facility] Therapy [address] [place], [place], [address] Phone: [phone]   Fax:  [phone]  Physical Therapy Treatment  Patient Details  Name: Shelley [name] MRN: [id number] Date of Birth: [birth year] Referring Provider (PT): Sallee Lange, MD   Encounter Date: 10/31/2020   PT End of Session - 10/31/20 0802     Visit Number 56    Number of Visits 7    Date for PT Re-Evaluation 11/21/20    Authorization Type Medicare & BCBS supp    Progress Note Due on Visit 45    PT Start Time 0800    PT Stop Time 0843    PT Time Calculation (min) 43 min    Equipment Utilized During Treatment Gait belt    Activity Tolerance Patient tolerated treatment well    Behavior During Therapy WFL for tasks assessed/performed             Past Medical History:  Diagnosis Date   Diabetes ([place])    Diabetes mellitus without complication ([place])    End stage renal disease ([place])    HTN (hypertension)    Hyperlipidemia    Hypertension     Past Surgical History:  Procedure Laterality Date   ABDOMINAL HYSTERECTOMY     fibroids/complete hysterec 2000   APPENDECTOMY     CATARACT EXTRACTION Bilateral    COLONOSCOPY N/A 03/12/2016   Procedure: COLONOSCOPY;  Surgeon: Daneil Dolin, MD;  [facility];  Service: Endoscopy;  Laterality: N/A;  9:30 am   POLYPECTOMY  03/12/2016   Procedure: POLYPECTOMY;  Surgeon: Daneil Dolin, MD;  [facility];  Service: Endoscopy;;  colon   YAG LASER APPLICATION Left 37/03/6965   Procedure: YAG LASER APPLICATION;  Surgeon: Williams Che, MD;  Location: [facility];  Service: Ophthalmology;  Laterality: Left;    There were no vitals filed for this visit.   Subjective Assessment - 10/31/20 0800     Subjective She continues to do her exercises and feels they help.  Her hip is less tender as she is getting used to lower heel lift in right shoe.    Pertinent History CKD st 5, DM2, HTN, renal CA, ESRD/ kidney  transplant 08/30/2017, Thoracotomy with lobectomy 02/24/2018,    Patient Stated Goals to be active like previously including working out (treadmill, weight training)    Currently in Pain? No/denies    Pain Onset In the past 7 days                               [facility] Adult PT Treatment/Exercise - 10/31/20 0800       Ambulation/Gait   Ambulation/Gait Yes    Ambulation/Gait Assistance 5: Supervision    Ambulation/Gait Assistance Details worked on divided attention naming foods A to Z with PT interupting to look at things around her.    Ambulation Distance (Feet) 400 Feet    Assistive device Prosthesis;None      High Level Balance   High Level Balance Comments see pt instructions for updated activity      Therapeutic Activites    Other Therapeutic Activities --      Knee/Hip Exercises: Stretches   Active Hamstring Stretch Both;2 reps;30 seconds    Active Hamstring Stretch Limitations supine knee extended with strap DF      Knee/Hip Exercises: Aerobic   Tread Mill 2.54mph 5 min with BUE support.      Knee/Hip Exercises:  Machines for Strengthening   Cybex Leg Press shuttle leg press 143# 15 reps 2 sets 1st set back 45* & 2nd set back flat, lifting RLE off plate with LLE ext hold for stance control.      Knee/Hip Exercises: Standing   Forward Step Up Both;1 set;5 reps;Hand Hold: 2;Step Height: 8"   BOSU round side up   Forward Step Up Limitations demo & verbal cues on technique    Step Down Both;5 reps;Hand Hold: 2;Step Height: 8"   BOSU round side up   Step Down Limitations demo & verbal cues on technique      Prosthetics   Prosthetic Care Comments  continue with current single layer of adjust a lift until no discomfort for 1 week. Then remove lift completely    Current prosthetic wear tolerance (days/week)  daily    Current prosthetic wear tolerance (#hours/day)  most of awake hours    Residual limb condition  wounds healed.    Education Provided Other  (comment)   see prosthetic care comments   Person(s) Educated Patient    Education Method Explanation;Verbal cues    Education Method Verbalized understanding;Verbal cues required;Needs further instruction                    PT Education - 10/31/20 0843     Education Details Need for ongoing fitness well rounded (see pt instructions ) & added stepping 4 directions    Person(s) Educated Patient    Methods Explanation;Verbal cues;Demonstration;Handout    Comprehension Verbalized understanding;Verbal cues required;Need further instruction              PT Short Term Goals - 10/23/20 0927       PT SHORT TERM GOAL #1   Title Patient demo & verbalize how to donne new socket with suction suspension.    Time 4    Period Weeks    Status Achieved    Target Date 10/24/20      PT SHORT TERM GOAL #2   Title Functional Gait Assessment >/= 15 / 30    Time 4    Period Weeks    Status Achieved    Target Date 10/24/20      PT SHORT TERM GOAL #3   Title Patient ambulates 400' with prosthesis only with supervision.    Time 4    Period Weeks    Status Achieved    Target Date 10/24/20      PT SHORT TERM GOAL #4   Title Patient negotiates stairs alternating pattern with single rail with supervision.    Time 4    Period Weeks    Status Achieved    Target Date 10/24/20               PT Long Term Goals - 09/18/20 0756       PT LONG TERM GOAL #1   Title Patient tolerates wear of  prosthesis with new socket design >90% of awake hours without skin or limb pain issues and verbalizes proper prosthetic care.    Time 12    Period Weeks    Status On-going    Target Date 11/22/20      PT LONG TERM GOAL #2   Title Functional Gait Assessment > 19/30 with prosthesis only to indicate lower fall risk.    Time 12    Period Weeks    Status On-going    Target Date 11/22/20      PT LONG TERM GOAL #  3   Title Timed Up & Go Cognitive <15 sec with no delay in cognitive task.     Time 12    Period Weeks    Status On-going    Target Date 11/22/20      PT LONG TERM GOAL #4   Title Patient ambulates >500' with prosthesis only and negotiates ramps/curbs/stairs single rail modified independent.    Time 12    Period Weeks    Status On-going    Target Date 11/22/20      PT LONG TERM GOAL #5   Title Patient verbalizes & demonstrates understanding of how to properly use fitness equipment to return to gym per her goal.    Time 12    Period Weeks    Status On-going    Target Date 11/22/20                   Plan - 10/31/20 0802     Clinical Impression Statement PT worked on divided attention with prosthetic gait which she improved with practice.  PT instructed in components of well rounded fitness pland that is recommended to continue after discharge.    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, Thoracotomy with lobectomy 02/24/2018,    Examination-Activity Limitations Locomotion Level;Lift;Squat;Stairs;Stand;Transfers    Examination-Participation Restrictions Community Activity    Stability/Clinical Decision Making Evolving/Moderate complexity    Rehab Potential Good    PT Frequency 2x / week    PT Duration 12 weeks    PT Treatment/Interventions ADLs/Self Care Home Management;DME Instruction;Gait training;Stair training;Functional mobility training;Therapeutic activities;Therapeutic exercise;Balance training;Neuromuscular re-education;Patient/family education;Prosthetic Training    PT Next Visit Plan work towards [employer], work on divided attention in gait,  continue therapeutic exercise including endurance & balance activities,    Consulted and Agree with Plan of Care Patient             Patient will benefit from skilled therapeutic intervention in order to improve the following deficits and impairments:   Abnormal gait, Decreased activity tolerance, Decreased balance, Decreased endurance, Decreased knowledge of use of DME, Decreased mobility, Decreased range of motion, Increased edema, Postural dysfunction, Prosthetic Dependency  Visit Diagnosis: Muscle weakness (generalized)  Other abnormalities of gait and mobility  Unsteadiness on feet  Abnormal posture     Problem List Patient Active Problem List   Diagnosis Date Noted   Uncontrolled type 2 diabetes mellitus with hyperglycemia, with long-term current use of insulin ([place]) 08/27/2017   Hyperlipidemia associated with type 2 diabetes mellitus ([place]) 03/18/2017   Chronic renal disease, stage V ([place]) 08/20/2015   Personal history of noncompliance with medical treatment, presenting hazards to health 08/20/2015   Proteinuria 07/04/2014   Uncontrolled type 2 diabetes mellitus with ESRD (end-stage renal disease) ([place]) 05/21/2014   Hyperlipidemia 05/21/2014   HTN (hypertension) 05/21/2014    Jamey Reas, PT, DPT 10/31/2020, 9:43 AM  [facility] [address] [place], [place], [address] Phone: [phone]   Fax:  [phone]  Name: Shelley [name] MRN: [id number] Date of Birth: 1950/08/23

## 2020-11-05 ENCOUNTER — Other Ambulatory Visit: Payer: Self-pay

## 2020-11-05 ENCOUNTER — Ambulatory Visit (INDEPENDENT_AMBULATORY_CARE_PROVIDER_SITE_OTHER): Payer: Medicare Other | Admitting: Physical Therapy

## 2020-11-05 ENCOUNTER — Encounter: Payer: Self-pay | Admitting: Physical Therapy

## 2020-11-05 DIAGNOSIS — M6281 Muscle weakness (generalized): Secondary | ICD-10-CM | POA: Diagnosis not present

## 2020-11-05 DIAGNOSIS — R2681 Unsteadiness on feet: Secondary | ICD-10-CM | POA: Diagnosis not present

## 2020-11-05 DIAGNOSIS — R2689 Other abnormalities of gait and mobility: Secondary | ICD-10-CM

## 2020-11-05 DIAGNOSIS — R293 Abnormal posture: Secondary | ICD-10-CM | POA: Diagnosis not present

## 2020-11-05 NOTE — Therapy (Signed)
[facility] Therapy [address] [place], [place], [address] Phone: [phone]   Fax:  [phone]  Physical Therapy Treatment  Patient Details  Name: Shelley [name] MRN: [id number] Date of Birth: 07-Oct-1950 Referring Provider (PT): Sallee Lange, MD   Encounter Date: 11/05/2020   PT End of Session - 11/05/20 0756     Visit Number 68    Number of Visits 92    Date for PT Re-Evaluation 11/21/20    Authorization Type Medicare & BCBS supp    Progress Note Due on Visit 24    PT Start Time 0757    PT Stop Time 0842    PT Time Calculation (min) 45 min    Equipment Utilized During Treatment Gait belt    Activity Tolerance Patient tolerated treatment well    Behavior During Therapy [facility] for tasks assessed/performed             Past Medical History:  Diagnosis Date   Diabetes ([place])    Diabetes mellitus without complication ([place])    End stage renal disease ([place])    HTN (hypertension)    Hyperlipidemia    Hypertension     Past Surgical History:  Procedure Laterality Date   ABDOMINAL HYSTERECTOMY     fibroids/complete hysterec 2000   APPENDECTOMY     CATARACT EXTRACTION Bilateral    COLONOSCOPY N/A 03/12/2016   Procedure: COLONOSCOPY;  Surgeon: Daneil Dolin, MD;  [facility];  Service: Endoscopy;  Laterality: N/A;  9:30 am   POLYPECTOMY  03/12/2016   Procedure: POLYPECTOMY;  Surgeon: Daneil Dolin, MD;  [facility];  Service: Endoscopy;;  colon   YAG LASER APPLICATION Left 05/25/7423   Procedure: YAG LASER APPLICATION;  Surgeon: Williams Che, MD;  Location: [facility];  Service: Ophthalmology;  Laterality: Left;    There were no vitals filed for this visit.   Subjective Assessment - 11/05/20 0757     Subjective She has been working on walking & thinking about other things as PT recommended.    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 No/denies    Pain Onset In the past 7 days                               [facility] Adult PT Treatment/Exercise - 11/05/20 0757       Ambulation/Gait   Ambulation/Gait Yes    Ambulation/Gait Assistance 5: Supervision    Ambulation/Gait Assistance Details worked on divided attention naming animals A to Z with PT interupting to look at things around her.    Ambulation Distance (Feet) 400 Feet    Assistive device Prosthesis;None      High Level Balance   High Level Balance Activities Tandem walking;Backward walking;Braiding    High Level Balance Comments intermittent UE support on //bars      Knee/Hip Exercises: Stretches   Active Hamstring Stretch Both;2 reps;30 seconds    Active Hamstring Stretch Limitations supine knee extended with strap DF      Knee/Hip Exercises: Aerobic   Tread Mill 2.89mph 5 min with BUE support.      Knee/Hip Exercises: Machines for Strengthening   Cybex Leg Press shuttle leg press 143# 15 reps 2 sets 1st set back  45* & 2nd set back flat, lifting RLE off plate with LLE ext hold for stance control.      Knee/Hip Exercises: Standing   Forward Step Up Both;1 set;5 reps;Hand Hold: 2;Step Height: 8"   BOSU round side up   Forward Step Up Limitations demo & verbal cues on technique    Step Down Both;5 reps;Hand Hold: 2;Step Height: 8"   BOSU round side up   Step Down Limitations demo & verbal cues on technique    Rocker Board 1 minute   ant/level/post & right/level/left, BUE support   Rocker Board Limitations mirror, demo & verbal cues on technique      Prosthetics   Prosthetic Care Comments  reviewed donning without wrinkles in suspension sleeve and drying limb / liners with any signs of sweating.    Current prosthetic wear tolerance (days/week)  daily    Current prosthetic wear tolerance (#hours/day)  most of awake hours     Residual limb condition  wounds healed.    Education Provided Other (comment);Proper Donning;Residual limb care;Skin check   see prosthetic care comments   Person(s) Educated Patient    Education Method Explanation;Verbal cues;Demonstration;Tactile cues    Education Method Verbalized understanding;Returned demonstration                    PT Education - 11/05/20 [id number]     Education Details PT printed all prior exercises & discussed working them into her week 3-5 times.    Person(s) Educated Patient    Methods Explanation;Verbal cues;Handout    Comprehension Verbalized understanding              PT Short Term Goals - 10/23/20 0927       PT SHORT TERM GOAL #1   Title Patient demo & verbalize how to donne new socket with suction suspension.    Time 4    Period Weeks    Status Achieved    Target Date 10/24/20      PT SHORT TERM GOAL #2   Title Functional Gait Assessment >/= 15 / 30    Time 4    Period Weeks    Status Achieved    Target Date 10/24/20      PT SHORT TERM GOAL #3   Title Patient ambulates 400' with prosthesis only with supervision.    Time 4    Period Weeks    Status Achieved    Target Date 10/24/20      PT SHORT TERM GOAL #4   Title Patient negotiates stairs alternating pattern with single rail with supervision.    Time 4    Period Weeks    Status Achieved    Target Date 10/24/20               PT Long Term Goals - 09/18/20 0756       PT LONG TERM GOAL #1   Title Patient tolerates wear of  prosthesis with new socket design >90% of awake hours without skin or limb pain issues and verbalizes proper prosthetic care.    Time 12    Period Weeks    Status On-going    Target Date 11/22/20      PT LONG TERM GOAL #2   Title Functional Gait Assessment > 19/30 with prosthesis only to indicate lower fall risk.    Time 12    Period Weeks    Status On-going    Target Date 11/22/20      PT LONG  TERM GOAL #3   Title Timed Up & Go  Cognitive <15 sec with no delay in cognitive task.    Time 12    Period Weeks    Status On-going    Target Date 11/22/20      PT LONG TERM GOAL #4   Title Patient ambulates >500' with prosthesis only and negotiates ramps/curbs/stairs single rail modified independent.    Time 12    Period Weeks    Status On-going    Target Date 11/22/20      PT LONG TERM GOAL #5   Title Patient verbalizes & demonstrates understanding of how to properly use fitness equipment to return to gym per her goal.    Time 12    Period Weeks    Status On-going    Target Date 11/22/20                   Plan - 11/05/20 0757     Clinical Impression Statement Patient is on target to meet LTGs by target date.  She is slowly improving her endurance and balance with progressive activities.    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, Thoracotomy with lobectomy 02/24/2018,    Examination-Activity Limitations Locomotion Level;Lift;Squat;Stairs;Stand;Transfers    Examination-Participation Restrictions Community Activity    Stability/Clinical Decision Making Evolving/Moderate complexity    Rehab Potential Good    PT Frequency 2x / week    PT Duration 12 weeks    PT Treatment/Interventions ADLs/Self Care Home Management;DME Instruction;Gait training;Stair training;Functional mobility training;Therapeutic activities;Therapeutic exercise;Balance training;Neuromuscular re-education;Patient/family education;Prosthetic Training    PT Next Visit Plan continue with therapeutic exercises to progress endurance, balance, flexibility & strength, work towards [employer] and Agree with Plan of Care Patient             Patient will benefit from skilled therapeutic intervention in order to improve the following deficits and impairments:  Abnormal gait, Decreased activity  tolerance, Decreased balance, Decreased endurance, Decreased knowledge of use of DME, Decreased mobility, Decreased range of motion, Increased edema, Postural dysfunction, Prosthetic Dependency  Visit Diagnosis: Muscle weakness (generalized)  Other abnormalities of gait and mobility  Unsteadiness on feet  Abnormal posture     Problem List Patient Active Problem List   Diagnosis Date Noted   Uncontrolled type 2 diabetes mellitus with hyperglycemia, with long-term current use of insulin ([place]) 08/27/2017   Hyperlipidemia associated with type 2 diabetes mellitus ([place]) 03/18/2017   Chronic renal disease, stage V ([place]) 08/20/2015   Personal history of noncompliance with medical treatment, presenting hazards to health 08/20/2015   Proteinuria 07/04/2014   Uncontrolled type 2 diabetes mellitus with ESRD (end-stage renal disease) ([place]) 05/21/2014   Hyperlipidemia 05/21/2014   HTN (hypertension) 05/21/2014    Jamey Reas, PT, DPT 11/05/2020, 8:45 AM  [facility] Physical Therapy [address] [place], [place], [address] Phone: [phone]   Fax:  [phone]  Name: Shelley [name] MRN: [id number] Date of Birth: 09-11-1950

## 2020-11-06 ENCOUNTER — Encounter: Payer: Self-pay | Admitting: Physical Therapy

## 2020-11-06 ENCOUNTER — Ambulatory Visit (INDEPENDENT_AMBULATORY_CARE_PROVIDER_SITE_OTHER): Payer: Medicare Other | Admitting: Physical Therapy

## 2020-11-06 DIAGNOSIS — R293 Abnormal posture: Secondary | ICD-10-CM | POA: Diagnosis not present

## 2020-11-06 DIAGNOSIS — R2681 Unsteadiness on feet: Secondary | ICD-10-CM

## 2020-11-06 DIAGNOSIS — R2689 Other abnormalities of gait and mobility: Secondary | ICD-10-CM

## 2020-11-06 DIAGNOSIS — M6281 Muscle weakness (generalized): Secondary | ICD-10-CM

## 2020-11-06 NOTE — Therapy (Signed)
[facility] Physical Therapy [address] [place], [place], [address] Phone: [phone]   Fax:  [phone]  Physical Therapy Treatment  Patient Details  Name: Shelley [name] MRN: [id number] Date of Birth: [birth year] Referring Provider (PT): Sallee Lange, MD   Encounter Date: 11/06/2020   PT End of Session - 11/06/20 0800     Visit Number 40    Number of Visits 50    Date for PT Re-Evaluation 11/21/20    Authorization Type Medicare & BCBS supp    Progress Note Due on Visit 58    PT Start Time 0800    PT Stop Time 0843    PT Time Calculation (min) 43 min    Equipment Utilized During Treatment Gait belt    Activity Tolerance Patient tolerated treatment well    Behavior During Therapy WFL for tasks assessed/performed             Past Medical History:  Diagnosis Date   Diabetes ([place])    Diabetes mellitus without complication ([place])    End stage renal disease ([place])    HTN (hypertension)    Hyperlipidemia    Hypertension     Past Surgical History:  Procedure Laterality Date   ABDOMINAL HYSTERECTOMY     fibroids/complete hysterec 2000   APPENDECTOMY     CATARACT EXTRACTION Bilateral    COLONOSCOPY N/A 03/12/2016   Procedure: COLONOSCOPY;  Surgeon: Daneil Dolin, MD;  [facility];  Service: Endoscopy;  Laterality: N/A;  9:30 am   POLYPECTOMY  03/12/2016   Procedure: POLYPECTOMY;  Surgeon: Daneil Dolin, MD;  [facility];  Service: Endoscopy;;  colon   YAG LASER APPLICATION Left 95/04/8411   Procedure: YAG LASER APPLICATION;  Surgeon: Williams Che, MD;  Location: [facility];  Service: Ophthalmology;  Laterality: Left;    There were no vitals filed for this visit.   Subjective Assessment - 11/06/20 0800     Subjective She requested PT show her how to put cosmetic knee sleeve on prosthesis.    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 No/denies    Pain Onset In the past 7 days                               [facility] Adult PT Treatment/Exercise - 11/06/20 0800       Ambulation/Gait   Ambulation/Gait Yes    Ambulation/Gait Assistance 5: Supervision    Ambulation Distance (Feet) 400 Feet    Assistive device Prosthesis;None    Ramp 5: Supervision   prosthesis only   Curb 5: Supervision   prosthesis only   Curb Details (indicate cue type and reason) worked on leading with either LE. Verbal cues on descending with prosthesis lowering      High Level Balance   High Level Balance Activities Tandem walking;Backward walking;Braiding;Other (comment)   Picking up pace "cross street"   High Level Balance Comments intermittent UE support on //bars      Knee/Hip Exercises: Stretches   Active Hamstring Stretch Both;2 reps;30 seconds    Active Hamstring Stretch Limitations supine knee extended with strap DF      Knee/Hip Exercises: Aerobic   Tread Mill 2.57mph 5 min with BUE support.  Knee/Hip Exercises: Machines for Strengthening   Cybex Leg Press shuttle leg press 150# 15 reps 2 sets 1st set back 45* & 2nd set back flat, lifting RLE off plate with LLE ext hold for stance control.      Knee/Hip Exercises: Standing   Forward Step Up Both;1 set;5 reps;Hand Hold: 2;Step Height: 8"   BOSU round side up   Forward Step Up Limitations demo & verbal cues on technique    Step Down Both;5 reps;Hand Hold: 2;Step Height: 8"   BOSU round side up   Step Down Limitations demo & verbal cues on technique    Rocker Board 1 minute   ant/level/post & right/level/left, BUE support   Theatre stage manager, demo & verbal cues on technique    SLS with Vectors Y-reach on BOSU round side up with BUE support 5 reps ea LE      Prosthetics   Prosthetic Care Comments  PT donned cosemetic sleeve cover while  instructing pt how to donne herself.    Current prosthetic wear tolerance (days/week)  daily    Current prosthetic wear tolerance (#hours/day)  most of awake hours    Residual limb condition  wounds healed.    Education Provided Other (comment)   see prosthetic care comments   Person(s) Educated Patient    Education Method Explanation;Demonstration;Verbal cues    Education Method Verbalized understanding;Tactile cues required;Verbal cues required                      PT Short Term Goals - 10/23/20 0927       PT SHORT TERM GOAL #1   Title Patient demo & verbalize how to donne new socket with suction suspension.    Time 4    Period Weeks    Status Achieved    Target Date 10/24/20      PT SHORT TERM GOAL #2   Title Functional Gait Assessment >/= 15 / 30    Time 4    Period Weeks    Status Achieved    Target Date 10/24/20      PT SHORT TERM GOAL #3   Title Patient ambulates 400' with prosthesis only with supervision.    Time 4    Period Weeks    Status Achieved    Target Date 10/24/20      PT SHORT TERM GOAL #4   Title Patient negotiates stairs alternating pattern with single rail with supervision.    Time 4    Period Weeks    Status Achieved    Target Date 10/24/20               PT Long Term Goals - 09/18/20 0756       PT LONG TERM GOAL #1   Title Patient tolerates wear of  prosthesis with new socket design >90% of awake hours without skin or limb pain issues and verbalizes proper prosthetic care.    Time 12    Period Weeks    Status On-going    Target Date 11/22/20      PT LONG TERM GOAL #2   Title Functional Gait Assessment > 19/30 with prosthesis only to indicate lower fall risk.    Time 12    Period Weeks    Status On-going    Target Date 11/22/20      PT LONG TERM GOAL #3   Title Timed Up & Go Cognitive <15 sec with no delay in cognitive task.  Time 12    Period Weeks    Status On-going    Target Date 11/22/20      PT LONG TERM  GOAL #4   Title Patient ambulates >500' with prosthesis only and negotiates ramps/curbs/stairs single rail modified independent.    Time 12    Period Weeks    Status On-going    Target Date 11/22/20      PT LONG TERM GOAL #5   Title Patient verbalizes & demonstrates understanding of how to properly use fitness equipment to return to gym per her goal.    Time 12    Period Weeks    Status On-going    Target Date 11/22/20                   Plan - 11/06/20 0800     Clinical Impression Statement Patient is slowly improving balance with high level activities. She arrived without cane in hand for first time today.  She appears on target to meet LTGs by target date.    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, Thoracotomy with lobectomy 02/24/2018,    Examination-Activity Limitations Locomotion Level;Lift;Squat;Stairs;Stand;Transfers    Examination-Participation Restrictions Community Activity    Stability/Clinical Decision Making Evolving/Moderate complexity    Rehab Potential Good    PT Frequency 2x / week    PT Duration 12 weeks    PT Treatment/Interventions ADLs/Self Care Home Management;DME Instruction;Gait training;Stair training;Functional mobility training;Therapeutic activities;Therapeutic exercise;Balance training;Neuromuscular re-education;Patient/family education;Prosthetic Training    PT Next Visit Plan therapeutic exercises to progress endurance, balance, flexibility & strength, work towards [employer] and Agree with Plan of Care Patient             Patient will benefit from skilled therapeutic intervention in order to improve the following deficits and impairments:  Abnormal gait, Decreased activity tolerance, Decreased balance, Decreased endurance, Decreased knowledge of use of DME, Decreased mobility, Decreased  range of motion, Increased edema, Postural dysfunction, Prosthetic Dependency  Visit Diagnosis: Muscle weakness (generalized)  Other abnormalities of gait and mobility  Unsteadiness on feet  Abnormal posture     Problem List Patient Active Problem List   Diagnosis Date Noted   Uncontrolled type 2 diabetes mellitus with hyperglycemia, with long-term current use of insulin ([place]) 08/27/2017   Hyperlipidemia associated with type 2 diabetes mellitus ([place]) 03/18/2017   Chronic renal disease, stage V ([place]) 08/20/2015   Personal history of noncompliance with medical treatment, presenting hazards to health 08/20/2015   Proteinuria 07/04/2014   Uncontrolled type 2 diabetes mellitus with ESRD (end-stage renal disease) ([place]) 05/21/2014   Hyperlipidemia 05/21/2014   HTN (hypertension) 05/21/2014    Jamey Reas, PT, DPT 11/06/2020, 9:34 AM  [facility] Physical Therapy [address] [place], [place], [address] Phone: [phone]   Fax:  [phone]  Name: Shelley [name] MRN: [id number] Date of Birth: 1950-10-13

## 2020-11-12 ENCOUNTER — Other Ambulatory Visit: Payer: Self-pay

## 2020-11-12 ENCOUNTER — Ambulatory Visit (INDEPENDENT_AMBULATORY_CARE_PROVIDER_SITE_OTHER): Payer: Medicare Other | Admitting: Physical Therapy

## 2020-11-12 ENCOUNTER — Encounter: Payer: Self-pay | Admitting: Physical Therapy

## 2020-11-12 DIAGNOSIS — R2689 Other abnormalities of gait and mobility: Secondary | ICD-10-CM | POA: Diagnosis not present

## 2020-11-12 DIAGNOSIS — R2681 Unsteadiness on feet: Secondary | ICD-10-CM

## 2020-11-12 DIAGNOSIS — R293 Abnormal posture: Secondary | ICD-10-CM

## 2020-11-12 DIAGNOSIS — M6281 Muscle weakness (generalized): Secondary | ICD-10-CM | POA: Diagnosis not present

## 2020-11-12 NOTE — Therapy (Signed)
[facility] Physical Therapy [address] [place], [place], [address] Phone: [phone]   Fax:  [phone]  Physical Therapy Treatment  Patient Details  Name: Shelley [name] MRN: [id number] Date of Birth: Aug 05, 1950 Referring Provider (PT): Sallee Lange, MD   Encounter Date: 11/12/2020   PT End of Session - 11/12/20 0800     Visit Number 41    Number of Visits 101    Date for PT Re-Evaluation 11/21/20    Authorization Type Medicare & BCBS supp    Progress Note Due on Visit 21    PT Start Time 0800    PT Stop Time 0845    PT Time Calculation (min) 45 min    Equipment Utilized During Treatment Gait belt    Activity Tolerance Patient tolerated treatment well    Behavior During Therapy WFL for tasks assessed/performed             Past Medical History:  Diagnosis Date   Diabetes ([place])    Diabetes mellitus without complication ([place])    End stage renal disease ([place])    HTN (hypertension)    Hyperlipidemia    Hypertension     Past Surgical History:  Procedure Laterality Date   ABDOMINAL HYSTERECTOMY     fibroids/complete hysterec 2000   APPENDECTOMY     CATARACT EXTRACTION Bilateral    COLONOSCOPY N/A 03/12/2016   Procedure: COLONOSCOPY;  Surgeon: Daneil Dolin, MD;  [facility];  Service: Endoscopy;  Laterality: N/A;  9:30 am   POLYPECTOMY  03/12/2016   Procedure: POLYPECTOMY;  Surgeon: Daneil Dolin, MD;  [facility];  Service: Endoscopy;;  colon   YAG LASER APPLICATION Left 26/05/7856   Procedure: YAG LASER APPLICATION;  Surgeon: Williams Che, MD;  Location: [facility];  Service: Ophthalmology;  Laterality: Left;    There were no vitals filed for this visit.   Subjective Assessment - 11/12/20 0800     Subjective Celebrating her birthday this weekend she walked on grass without cane & had no issues.    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 No/denies    Pain Onset In the past 7 days                               [facility] Adult PT Treatment/Exercise - 11/12/20 0800       Ambulation/Gait   Ambulation/Gait Yes    Ambulation/Gait Assistance 5: Supervision    Ambulation Distance (Feet) 400 Feet    Assistive device Prosthesis;None    Ramp 5: Supervision   prosthesis only   Curb 5: Supervision   prosthesis only     High Level Balance   High Level Balance Activities Tandem walking;Braiding;Other (comment);Backward walking;Negotiating over obstacles   Picking up pace "cross street"     Knee/Hip Exercises: Stretches   Active Hamstring Stretch Both;2 reps;30 seconds    Active Hamstring Stretch Limitations supine knee extended with strap DF      Knee/Hip Exercises: Aerobic   Tread Mill 2.46mph 5 min with BUE support.    Other Aerobic PT instructed in "Talk-Sing" test for intensity guidance. Pt verbalized understanding.      Knee/Hip Exercises: Machines for Strengthening   Cybex Leg Press shuttle leg press  150# 15 reps 2 sets 1st set back 45* & 2nd set back flat, lifting RLE off plate with LLE ext hold for stance control.      Knee/Hip Exercises: Standing   Forward Step Up Both;1 set;5 reps;Hand Hold: 2;Step Height: 8"   BOSU round side up   Forward Step Up Limitations demo & verbal cues on technique    Step Down Both;5 reps;Hand Hold: 2;Step Height: 8"   BOSU round side up   Step Down Limitations demo & verbal cues on technique    Rocker Board 1 minute   ant/level/post & right/level/left, BUE support   Theatre stage manager, demo & verbal cues on technique    SLS with Vectors Y-reach on BOSU round side up with BUE support 5 reps ea LE. cues on technique including posture      Prosthetics   Current prosthetic wear tolerance (days/week)  daily    Current  prosthetic wear tolerance (#hours/day)  most of awake hours    Residual limb condition  wounds healed.    Education Provided --   see prosthetic care comments                     PT Short Term Goals - 10/23/20 2947       PT SHORT TERM GOAL #1   Title Patient demo & verbalize how to donne new socket with suction suspension.    Time 4    Period Weeks    Status Achieved    Target Date 10/24/20      PT SHORT TERM GOAL #2   Title Functional Gait Assessment >/= 15 / 30    Time 4    Period Weeks    Status Achieved    Target Date 10/24/20      PT SHORT TERM GOAL #3   Title Patient ambulates 400' with prosthesis only with supervision.    Time 4    Period Weeks    Status Achieved    Target Date 10/24/20      PT SHORT TERM GOAL #4   Title Patient negotiates stairs alternating pattern with single rail with supervision.    Time 4    Period Weeks    Status Achieved    Target Date 10/24/20               PT Long Term Goals - 09/18/20 0756       PT LONG TERM GOAL #1   Title Patient tolerates wear of  prosthesis with new socket design >90% of awake hours without skin or limb pain issues and verbalizes proper prosthetic care.    Time 12    Period Weeks    Status On-going    Target Date 11/22/20      PT LONG TERM GOAL #2   Title Functional Gait Assessment > 19/30 with prosthesis only to indicate lower fall risk.    Time 12    Period Weeks    Status On-going    Target Date 11/22/20      PT LONG TERM GOAL #3   Title Timed Up & Go Cognitive <15 sec with no delay in cognitive task.    Time 12    Period Weeks    Status On-going    Target Date 11/22/20      PT LONG TERM GOAL #4   Title Patient ambulates >500' with prosthesis only and negotiates ramps/curbs/stairs single rail modified independent.    Time 12  Period Weeks    Status On-going    Target Date 11/22/20      PT LONG TERM GOAL #5   Title Patient verbalizes & demonstrates understanding of how to  properly use fitness equipment to return to gym per her goal.    Time 12    Period Weeks    Status On-going    Target Date 11/22/20                   Plan - 11/12/20 0802     Clinical Impression Statement Patient appears on target to meet LTGs next week at end of POC.  PT working on fine tuning issues with function & balance.  Her ability to walk on grass this weekend indicates improvement.    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, Thoracotomy with lobectomy 02/24/2018,    Examination-Activity Limitations Locomotion Level;Lift;Squat;Stairs;Stand;Transfers    Examination-Participation Restrictions Community Activity    Stability/Clinical Decision Making Evolving/Moderate complexity    Rehab Potential Good    PT Frequency 2x / week    PT Duration 12 weeks    PT Treatment/Interventions ADLs/Self Care Home Management;DME Instruction;Gait training;Stair training;Functional mobility training;Therapeutic activities;Therapeutic exercise;Balance training;Neuromuscular re-education;Patient/family education;Prosthetic Training    PT Next Visit Plan Do 10th visit note, therapeutic exercises to progress endurance, balance, flexibility & strength, work towards [employer] and Agree with Plan of Care Patient             Patient will benefit from skilled therapeutic intervention in order to improve the following deficits and impairments:  Abnormal gait, Decreased activity tolerance, Decreased balance, Decreased endurance, Decreased knowledge of use of DME, Decreased mobility, Decreased range of motion, Increased edema, Postural dysfunction, Prosthetic Dependency  Visit Diagnosis: Muscle weakness (generalized)  Other abnormalities of gait and mobility  Unsteadiness on feet  Abnormal posture     Problem List Patient Active Problem  List   Diagnosis Date Noted   Uncontrolled type 2 diabetes mellitus with hyperglycemia, with long-term current use of insulin ([place]) 08/27/2017   Hyperlipidemia associated with type 2 diabetes mellitus ([place]) 03/18/2017   Chronic renal disease, stage V ([place]) 08/20/2015   Personal history of noncompliance with medical treatment, presenting hazards to health 08/20/2015   Proteinuria 07/04/2014   Uncontrolled type 2 diabetes mellitus with ESRD (end-stage renal disease) ([place]) 05/21/2014   Hyperlipidemia 05/21/2014   HTN (hypertension) 05/21/2014    Jamey Reas, PT, DPT 11/12/2020, 1:19 PM  [facility] Physical Therapy [address] [place], [place], [address] Phone: [phone]   Fax:  [phone]  Name: Shelley [name] MRN: [id number] Date of Birth: 05-30-1950

## 2020-11-13 ENCOUNTER — Ambulatory Visit (INDEPENDENT_AMBULATORY_CARE_PROVIDER_SITE_OTHER): Payer: Medicare Other | Admitting: Physical Therapy

## 2020-11-13 ENCOUNTER — Encounter: Payer: Self-pay | Admitting: Physical Therapy

## 2020-11-13 DIAGNOSIS — R2689 Other abnormalities of gait and mobility: Secondary | ICD-10-CM

## 2020-11-13 DIAGNOSIS — M6281 Muscle weakness (generalized): Secondary | ICD-10-CM | POA: Diagnosis not present

## 2020-11-13 DIAGNOSIS — R293 Abnormal posture: Secondary | ICD-10-CM

## 2020-11-13 DIAGNOSIS — R2681 Unsteadiness on feet: Secondary | ICD-10-CM

## 2020-11-13 NOTE — Therapy (Signed)
[place], [place], [address] Phone: [phone]   Fax:  [phone]  Physical Therapy Treatment & 10th Visit Progress Note  Patient Details  Name: Shelley [name] MRN: [id number] Date of Birth: 11/03/1950 Referring Provider (PT): Sallee Lange, MD   Encounter Date: 11/13/2020  Progress Note Reporting Period 10/15/2020 to 11/13/2020  See note below for Objective Data and Assessment of Progress/Goals.       PT End of Session - 11/13/20 0758     Visit Number 42    Number of Visits 15    Date for PT Re-Evaluation 11/21/20    Authorization Type Medicare & BCBS supp    PT Start Time 0758    PT Stop Time 0845    PT Time Calculation (min) 47 min    Equipment Utilized During Treatment Gait belt    Activity Tolerance Patient tolerated treatment well    Behavior During Therapy WFL for tasks assessed/performed             Past Medical History:  Diagnosis Date   Diabetes ([place])    Diabetes mellitus without complication ([place])    End stage renal disease ([place])    HTN (hypertension)    Hyperlipidemia    Hypertension     Past Surgical History:  Procedure Laterality Date   ABDOMINAL HYSTERECTOMY     fibroids/complete hysterec 2000   APPENDECTOMY     CATARACT EXTRACTION Bilateral    COLONOSCOPY N/A 03/12/2016   Procedure: COLONOSCOPY;  Surgeon: Daneil Dolin, MD;  [facility];  Service: Endoscopy;  Laterality: N/A;  9:30 am   POLYPECTOMY  03/12/2016   Procedure: POLYPECTOMY;  Surgeon: Daneil Dolin, MD;  [facility];  Service: Endoscopy;;  colon   YAG LASER APPLICATION Left 47/06/2593   Procedure: YAG LASER APPLICATION;  Surgeon: Williams Che, MD;  Location: [facility];  Service: Ophthalmology;  Laterality: Left;    There were no vitals filed for this visit.   Subjective Assessment - 11/13/20 0759     Subjective She had stomach ache yesterday so layed around house after PT.    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 No/denies    Pain Onset In the past 7 days                [facility] PT Assessment - 11/13/20 0759       Assessment   Medical Diagnosis Left Transtibial Amputation    Referring Provider (PT) Sallee Lange, MD    Onset Date/Surgical Date 05/17/20   prosthesis delivery     Timed Up and Go Test   Normal TUG (seconds) 8.73    Cognitive TUG (seconds) 9.33   naming animals AtoZ without stopping                          [facility] Adult PT Treatment/Exercise - 11/13/20 0759       Ambulation/Gait   Ambulation/Gait Yes    Ambulation/Gait Assistance 5: Supervision    Ambulation Distance (Feet) 400 Feet    Assistive device Prosthesis;None    Ramp 5: Supervision   prosthesis only   Curb 5: Supervision   prosthesis only     High Level Balance   High Level Balance  Activities Tandem walking;Braiding;Other (comment);Backward walking;Negotiating over obstacles;Turns;Side stepping   turning 90* and Picking up pace "cross street"   High Level Balance Comments intermittent UE support on //bars for tandem forward & backward and braiding      Knee/Hip Exercises: Stretches   Active Hamstring Stretch Both;2 reps;30 seconds    Active Hamstring Stretch Limitations supine knee extended with strap DF      Knee/Hip Exercises: Aerobic   Tread Mill 2.1mph 5 min with BUE support.    Other Aerobic --      Knee/Hip Exercises: Machines for Strengthening   Cybex Leg Press shuttle leg press 150# 15 reps 2 sets 1st set back 45* & 2nd set back flat, lifting RLE off plate with LLE ext hold for stance control.      Knee/Hip Exercises: Standing   Forward Step Up Both;1 set;5 reps;Hand Hold: 2;Step Height: 8"   BOSU round side up   Forward Step Up Limitations demo &  verbal cues on technique    Step Down Both;5 reps;Hand Hold: 2;Step Height: 8"   BOSU round side up   Step Down Limitations demo & verbal cues on technique    Rocker Board 1 minute   ant/level/post & right/level/left, BUE support   Theatre stage manager, demo & verbal cues on technique    SLS with Vectors Y-reach on BOSU round side up with BUE support 5 reps ea LE. cues on technique including posture      Prosthetics   Current prosthetic wear tolerance (days/week)  daily    Current prosthetic wear tolerance (#hours/day)  most of awake hours    Residual limb condition  wounds healed.    Education Provided --   see prosthetic care comments                     PT Short Term Goals - 10/23/20 1601       PT SHORT TERM GOAL #1   Title Patient demo & verbalize how to donne new socket with suction suspension.    Time 4    Period Weeks    Status Achieved    Target Date 10/24/20      PT SHORT TERM GOAL #2   Title Functional Gait Assessment >/= 15 / 30    Time 4    Period Weeks    Status Achieved    Target Date 10/24/20      PT SHORT TERM GOAL #3   Title Patient ambulates 400' with prosthesis only with supervision.    Time 4    Period Weeks    Status Achieved    Target Date 10/24/20      PT SHORT TERM GOAL #4   Title Patient negotiates stairs alternating pattern with single rail with supervision.    Time 4    Period Weeks    Status Achieved    Target Date 10/24/20               PT Long Term Goals - 11/13/20 0841       PT LONG TERM GOAL #1   Title Patient tolerates wear of  prosthesis with new socket design >90% of awake hours without skin or limb pain issues and verbalizes proper prosthetic care.    Time 12    Period Weeks    Status On-going    Target Date 11/22/20      PT LONG TERM GOAL #2   Title Functional Gait Assessment > 19/30 with prosthesis  only to indicate lower fall risk.    Time 12    Period Weeks    Status On-going    Target  Date 11/22/20      PT LONG TERM GOAL #3   Title Timed Up & Go Cognitive <15 sec with no delay in cognitive task.    Time 12    Period Weeks    Status Achieved      PT LONG TERM GOAL #4   Title Patient ambulates >500' with prosthesis only and negotiates ramps/curbs/stairs single rail modified independent.    Time 12    Period Weeks    Status On-going    Target Date 11/22/20      PT LONG TERM GOAL #5   Title Patient verbalizes & demonstrates understanding of how to properly use fitness equipment to return to gym per her goal.    Time 12    Period Weeks    Status On-going    Target Date 11/22/20                   Plan - 11/13/20 0758     Clinical Impression Statement Patient's TUG standard & cognitive have improved significantly and are well below cutoff time for fall risk.  Patient is on target to meet LTGs and discharge PT next week.  Her prosthesis appears toed-in slightly and PT recommended seeing prosthetist for alignment.    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, Thoracotomy with lobectomy 02/24/2018,    Examination-Activity Limitations Locomotion Level;Lift;Squat;Stairs;Stand;Transfers    Examination-Participation Restrictions Community Activity    Stability/Clinical Decision Making Evolving/Moderate complexity    Rehab Potential Good    PT Frequency 2x / week    PT Duration 12 weeks    PT Treatment/Interventions ADLs/Self Care Home Management;DME Instruction;Gait training;Stair training;Functional mobility training;Therapeutic activities;Therapeutic exercise;Balance training;Neuromuscular re-education;Patient/family education;Prosthetic Training    PT Next Visit Plan therapeutic exercises to progress endurance, balance, flexibility & strength, begin to assess LTGs    Consulted and Agree with Plan of Care Patient              Patient will benefit from skilled therapeutic intervention in order to improve the following deficits and impairments:  Abnormal gait, Decreased activity tolerance, Decreased balance, Decreased endurance, Decreased knowledge of use of DME, Decreased mobility, Decreased range of motion, Increased edema, Postural dysfunction, Prosthetic Dependency  Visit Diagnosis: Muscle weakness (generalized)  Other abnormalities of gait and mobility  Unsteadiness on feet  Abnormal posture     Problem List Patient Active Problem List   Diagnosis Date Noted   Uncontrolled type 2 diabetes mellitus with hyperglycemia, with long-term current use of insulin ([place]) 08/27/2017   Hyperlipidemia associated with type 2 diabetes mellitus ([place]) 03/18/2017   Chronic renal disease, stage V ([place]) 08/20/2015   Personal history of noncompliance with medical treatment, presenting hazards to health 08/20/2015   Proteinuria 07/04/2014   Uncontrolled type 2 diabetes mellitus with ESRD (end-stage renal disease) ([place]) 05/21/2014   Hyperlipidemia 05/21/2014   HTN (hypertension) 05/21/2014    Jamey Reas, PT, DPT 11/13/2020, 8:45 AM  [facility] Physical Therapy [address] [place], [place], [address] Phone: [phone]   Fax:  [phone]  Name: Shelley [name] MRN: [id number] Date of Birth: Oct 27, 1950

## 2020-11-19 ENCOUNTER — Ambulatory Visit (INDEPENDENT_AMBULATORY_CARE_PROVIDER_SITE_OTHER): Payer: Medicare Other | Admitting: Physical Therapy

## 2020-11-19 ENCOUNTER — Other Ambulatory Visit: Payer: Self-pay

## 2020-11-19 ENCOUNTER — Encounter: Payer: Self-pay | Admitting: Physical Therapy

## 2020-11-19 DIAGNOSIS — R2681 Unsteadiness on feet: Secondary | ICD-10-CM | POA: Diagnosis not present

## 2020-11-19 DIAGNOSIS — R293 Abnormal posture: Secondary | ICD-10-CM | POA: Diagnosis not present

## 2020-11-19 DIAGNOSIS — M6281 Muscle weakness (generalized): Secondary | ICD-10-CM

## 2020-11-19 DIAGNOSIS — R2689 Other abnormalities of gait and mobility: Secondary | ICD-10-CM | POA: Diagnosis not present

## 2020-11-19 NOTE — Therapy (Signed)
[facility] Physical Therapy [address] [place], [place], [address] Phone: [phone]   Fax:  [phone]  Physical Therapy Treatment  Patient Details  Name: Shelley [name] MRN: [id number] Date of Birth: 26-Jan-1951 Referring Provider (PT): Sallee Lange, MD   Encounter Date: 11/19/2020   PT End of Session - 11/19/20 0756     Visit Number 43    Number of Visits 13    Date for PT Re-Evaluation 11/21/20    Authorization Type Medicare & BCBS supp    PT Start Time 0756    PT Stop Time 0845    PT Time Calculation (min) 49 min    Equipment Utilized During Treatment Gait belt    Activity Tolerance Patient tolerated treatment well    Behavior During Therapy [facility] for tasks assessed/performed             Past Medical History:  Diagnosis Date   Diabetes ([place])    Diabetes mellitus without complication ([place])    End stage renal disease ([place])    HTN (hypertension)    Hyperlipidemia    Hypertension     Past Surgical History:  Procedure Laterality Date   ABDOMINAL HYSTERECTOMY     fibroids/complete hysterec 2000   APPENDECTOMY     CATARACT EXTRACTION Bilateral    COLONOSCOPY N/A 03/12/2016   Procedure: COLONOSCOPY;  Surgeon: Daneil Dolin, MD;  [facility];  Service: Endoscopy;  Laterality: N/A;  9:30 am   POLYPECTOMY  03/12/2016   Procedure: POLYPECTOMY;  Surgeon: Daneil Dolin, MD;  [facility];  Service: Endoscopy;;  colon   YAG LASER APPLICATION Left 67/04/945   Procedure: YAG LASER APPLICATION;  Surgeon: Williams Che, MD;  Location: [facility];  Service: Ophthalmology;  Laterality: Left;    There were no vitals filed for this visit.   Subjective Assessment - 11/19/20 0757     Subjective She went to an event for [employer]. She had friends who were impressed with her walking.    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 No/denies    Pain Onset In the past 7 days                [facility] PT Assessment - 11/19/20 0758       Assessment   Medical Diagnosis Left Transtibial Amputation    Referring Provider (PT) Sallee Lange, MD    Onset Date/Surgical Date 05/17/20   prosthesis delivery   Hand Dominance Right      Ambulation/Gait   Ambulation/Gait Yes    Ambulation/Gait Assistance 6: Modified independent (Device/Increase time)    Gait velocity 3.32 ft/sec comfortable pace & 4.57 ft/sec fast pace      6 Minute Walk- Baseline   6 Minute Walk- Baseline yes    BP (mmHg) 211/112    HR (bpm) 67    02 Sat (%RA) 100 %      6 Minute walk- Post Test   6 Minute Walk Post Test yes    BP (mmHg) 212/83    HR (bpm) 95   recovered to 62 within 1 min   02 Sat (%RA) 94 %    Modified Borg Scale for Dyspnea 5- Strong or hard breathing    Perceived Rate of Exertion (Borg) 12-  6 minute walk test results    Aerobic Endurance Distance Walked 1036   seated rest for 1 min at 22min45sec 576'     [place]yman to Stand Able to stand without using hands and stabilize independently    Standing Unsupported Able to stand safely 2 minutes    Sitting with Back Unsupported but Feet Supported on Floor or Stool Able to sit safely and securely 2 minutes    Stand to Sit Sits safely with minimal use of hands    Transfers Able to transfer safely, minor use of hands    Standing Unsupported with Eyes Closed Able to stand 10 seconds safely    Standing Unsupported with Feet Together Able to place feet together independently and stand 1 minute safely    From Standing, Reach Forward with Outstretched Arm Can reach confidently >25 cm (10")    From Standing Position, Pick up Object from Floor Able to pick up shoe safely and easily    From Standing Position, Turn to Look Behind Over each Shoulder Looks behind from both sides  and weight shifts well    Turn 360 Degrees Able to turn 360 degrees safely in 4 seconds or less    Standing Unsupported, Alternately Place Feet on Step/Stool Able to stand independently and safely and complete 8 steps in 20 seconds    Standing Unsupported, One Foot in Front Able to plae foot ahead of the other independently and hold 30 seconds    Standing on One Leg Able to lift leg independently and hold 5-10 seconds    Total Score 54    Berg comment: initially was 35/56      Timed Up and Go Test   Normal TUG (seconds) 8.73    Cognitive TUG (seconds) 9.33   naming animals AtoZ without stopping     Functional Gait  Assessment   Gait assessed  Yes                           [facility] Adult PT Treatment/Exercise - 11/19/20 3710       Neuro Re-ed    Neuro Re-ed Details  tandem stance 30sec w/ intermittent UE touch 1 rep prosthesis in front & 1 rep prosthesis in back.      Prosthetics   Prosthetic Care Comments  PT demo & instructed in temporary cover for times that she wants to dress up.                      PT Short Term Goals - 10/23/20 6269       PT SHORT TERM GOAL #1   Title Patient demo & verbalize how to donne new socket with suction suspension.    Time 4    Period Weeks    Status Achieved    Target Date 10/24/20      PT SHORT TERM GOAL #2   Title Functional Gait Assessment >/= 15 / 30    Time 4    Period Weeks    Status Achieved    Target Date 10/24/20      PT SHORT TERM GOAL #3   Title Patient ambulates 400' with prosthesis only with supervision.    Time 4    Period Weeks    Status Achieved    Target Date 10/24/20      PT SHORT TERM GOAL #4   Title Patient negotiates stairs alternating pattern with single  rail with supervision.    Time 4    Period Weeks    Status Achieved    Target Date 10/24/20               PT Long Term Goals - 11/19/20 0933       PT LONG TERM GOAL #1   Title Patient tolerates wear of  prosthesis with  new socket design >90% of awake hours without skin or limb pain issues and verbalizes proper prosthetic care.    Time 12    Period Weeks    Status On-going      PT LONG TERM GOAL #2   Title Functional Gait Assessment > 19/30 with prosthesis only to indicate lower fall risk.    Time 12    Period Weeks    Status On-going      PT LONG TERM GOAL #3   Title Timed Up & Go Cognitive <15 sec with no delay in cognitive task.    Time 12    Period Weeks    Status Achieved      PT LONG TERM GOAL #4   Title Patient ambulates >500' with prosthesis only and negotiates ramps/curbs/stairs single rail modified independent.    Time 12    Period Weeks    Status On-going      PT LONG TERM GOAL #5   Title Patient verbalizes & demonstrates understanding of how to properly use fitness equipment to return to gym per her goal.    Time 12    Period Weeks    Status On-going                   Plan - 11/19/20 0756     Clinical Impression Statement Patient's balance is improved as noted by Merrilee Jansky Balance 54/56.  She has improved prosthetic gait without device with 6-Minute Walk Test over 1000'.  Her BP was high. She reports that she does not take BP med until ~10am.    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, Thoracotomy with lobectomy 02/24/2018,    Examination-Activity Limitations Locomotion Level;Lift;Squat;Stairs;Stand;Transfers    Examination-Participation Restrictions Community Activity    Stability/Clinical Decision Making Evolving/Moderate complexity    Rehab Potential Good    PT Frequency 2x / week    PT Duration 12 weeks    PT Treatment/Interventions ADLs/Self Care Home Management;DME Instruction;Gait training;Stair training;Functional mobility training;Therapeutic activities;Therapeutic exercise;Balance training;Neuromuscular  re-education;Patient/family education;Prosthetic Training    PT Next Visit Plan assess remaining LTGs & discharge    Consulted and Agree with Plan of Care Patient             Patient will benefit from skilled therapeutic intervention in order to improve the following deficits and impairments:  Abnormal gait, Decreased activity tolerance, Decreased balance, Decreased endurance, Decreased knowledge of use of DME, Decreased mobility, Decreased range of motion, Increased edema, Postural dysfunction, Prosthetic Dependency  Visit Diagnosis: Muscle weakness (generalized)  Other abnormalities of gait and mobility  Unsteadiness on feet  Abnormal posture     Problem List Patient Active Problem List   Diagnosis Date Noted   Uncontrolled type 2 diabetes mellitus with hyperglycemia, with long-term current use of insulin ([place]) 08/27/2017   Hyperlipidemia associated with type 2 diabetes mellitus ([place]) 03/18/2017   Chronic renal disease, stage V ([place]) 08/20/2015   Personal history of noncompliance with medical treatment, presenting hazards to health 08/20/2015  Proteinuria 07/04/2014   Uncontrolled type 2 diabetes mellitus with ESRD (end-stage renal disease) ([place]) 05/21/2014   Hyperlipidemia 05/21/2014   HTN (hypertension) 05/21/2014    Jamey Reas, PT, DPT 11/19/2020, 9:36 AM  [facility] Physical Therapy [address] [place], [place], [address] Phone: [phone]   Fax:  [phone]  Name: Shelley [name] MRN: [id number] Date of Birth: Sep 29, 1950

## 2020-11-20 ENCOUNTER — Ambulatory Visit (INDEPENDENT_AMBULATORY_CARE_PROVIDER_SITE_OTHER): Payer: Medicare Other | Admitting: Physical Therapy

## 2020-11-20 ENCOUNTER — Encounter: Payer: Self-pay | Admitting: Physical Therapy

## 2020-11-20 DIAGNOSIS — M6281 Muscle weakness (generalized): Secondary | ICD-10-CM | POA: Diagnosis not present

## 2020-11-20 DIAGNOSIS — R2681 Unsteadiness on feet: Secondary | ICD-10-CM

## 2020-11-20 DIAGNOSIS — R293 Abnormal posture: Secondary | ICD-10-CM | POA: Diagnosis not present

## 2020-11-20 DIAGNOSIS — R2689 Other abnormalities of gait and mobility: Secondary | ICD-10-CM | POA: Diagnosis not present

## 2020-11-20 NOTE — Therapy (Signed)
[facility] [address] [place], [place], [address] Phone: [phone]   Fax:  [phone]  Physical Therapy Treatment & Discharge Summary  Patient Details  Name: Shelley [name] MRN: [id number] Date of Birth: 01/09/1951 Referring Provider (PT): Sallee Lange, MD   Encounter Date: 11/20/2020  PHYSICAL THERAPY DISCHARGE SUMMARY  Visits from Start of Care: 44  Current functional level related to goals / functional outcomes: See below   Remaining deficits: See below   Education / Equipment: Prosthetic care & Fitness Plan / HEP   Patient agrees to discharge. Patient goals were met. Patient is being discharged due to meeting the stated rehab goals.    PT End of Session - 11/20/20 0801     Visit Number 49    Number of Visits 44    Date for PT Re-Evaluation 11/21/20    Authorization Type Medicare & BCBS supp    PT Start Time 0800    PT Stop Time 0833    PT Time Calculation (min) 33 min    Equipment Utilized During Treatment Gait belt    Activity Tolerance Patient tolerated treatment well    Behavior During Therapy WFL for tasks assessed/performed             Past Medical History:  Diagnosis Date   Diabetes ([place])    Diabetes mellitus without complication ([place])    End stage renal disease ([place])    HTN (hypertension)    Hyperlipidemia    Hypertension     Past Surgical History:  Procedure Laterality Date   ABDOMINAL HYSTERECTOMY     fibroids/complete hysterec 2000   APPENDECTOMY     CATARACT EXTRACTION Bilateral    COLONOSCOPY N/A 03/12/2016   Procedure: COLONOSCOPY;  Surgeon: Daneil Dolin, MD;  [facility];  Service: Endoscopy;  Laterality: N/A;  9:30 am   POLYPECTOMY  03/12/2016   Procedure: POLYPECTOMY;  Surgeon: Daneil Dolin, MD;  Location: [facility];  Service: Endoscopy;;  colon   YAG LASER APPLICATION Left 20/05/5595   Procedure: YAG LASER APPLICATION;  Surgeon: Williams Che, MD;  Location: [facility] ORS;   Service: Ophthalmology;  Laterality: Left;    There were no vitals filed for this visit.   Subjective Assessment - 11/20/20 0800     Subjective She feels like she made significant progress with functioning with prosthesis.    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 No/denies    Pain Onset In the past 7 days                [facility] PT Assessment - 11/20/20 0800       Assessment   Medical Diagnosis Left Transtibial Amputation    Referring Provider (PT) Sallee Lange, MD    Onset Date/Surgical Date 05/17/20   prosthesis delivery   Hand Dominance Right      Transfers   Transfers Sit to Stand;Stand to Sit    Sit to Stand 6: Modified independent (Device/Increase time);Without upper extremity assist;From chair/3-in-1    Stand to Sit 6: Modified independent (Device/Increase time);Without upper extremity assist;To chair/3-in-1      Ambulation/Gait   Ambulation/Gait Yes    Ambulation/Gait Assistance 7: Independent    Ambulation Distance (Feet) 600 Feet    Assistive device Prosthesis;None    Ambulation Surface  Level;Indoor    Gait velocity 3.64 ft/sec comfortable pace & 4.85 ft/sec fast pace    Stairs Yes    Stairs Assistance 6: Modified independent (Device/Increase time)    Stair Management Technique One rail Left;Alternating pattern;Forwards    Number of Stairs 11    Height of Stairs 6    Ramp 7: Independent   prosthesis only   Curb 7: Independent   prosthesis only     6 Minute Walk- Baseline   6 Minute Walk- Baseline yes    BP (mmHg) 211/112    HR (bpm) 67    02 Sat (%RA) 100 %      6 Minute walk- Post Test   6 Minute Walk Post Test yes    BP (mmHg) 212/83    HR (bpm) 95   recovered to 62 within 1 min   02 Sat (%RA) 94 %    Modified Borg Scale for Dyspnea 5- Strong  or hard breathing    Perceived Rate of Exertion (Borg) 12-      6 minute walk test results    Aerobic Endurance Distance Walked 1036   seated rest for 1 min at 35mn45sec 576'     Berg Balance Test   Sit to Stand Able to stand without using hands and stabilize independently    Standing Unsupported Able to stand safely 2 minutes    Sitting with Back Unsupported but Feet Supported on Floor or Stool Able to sit safely and securely 2 minutes    Stand to Sit Sits safely with minimal use of hands    Transfers Able to transfer safely, minor use of hands    Standing Unsupported with Eyes Closed Able to stand 10 seconds safely    Standing Unsupported with Feet Together Able to place feet together independently and stand 1 minute safely    From Standing, Reach Forward with Outstretched Arm Can reach confidently >25 cm (10")    From Standing Position, Pick up Object from Floor Able to pick up shoe safely and easily    From Standing Position, Turn to Look Behind Over each Shoulder Looks behind from both sides and weight shifts well    Turn 360 Degrees Able to turn 360 degrees safely in 4 seconds or less    Standing Unsupported, Alternately Place Feet on Step/Stool Able to stand independently and safely and complete 8 steps in 20 seconds    Standing Unsupported, One Foot in Front Able to plae foot ahead of the other independently and hold 30 seconds    Standing on One Leg Able to lift leg independently and hold 5-10 seconds    Total Score 54    Berg comment: initially was 35/56      Timed Up and Go Test   Normal TUG (seconds) 8.73    Cognitive TUG (seconds) 9.33   naming animals AtoZ without stopping     Functional Gait  Assessment   Gait assessed  Yes    Gait Level Surface Walks 20 ft in less than 5.5 sec, no assistive devices, good speed, no evidence for imbalance, normal gait pattern, deviates no more than 6 in outside of the 12 in walkway width.    Change in Gait Speed Able to smoothly change  walking speed without loss of balance or gait deviation. Deviate no more than 6 in outside of the 12 in walkway width.    Gait with Horizontal Head Turns Performs head turns smoothly with no change in gait. Deviates  no more than 6 in outside 12 in walkway width    Gait with Vertical Head Turns Performs head turns with no change in gait. Deviates no more than 6 in outside 12 in walkway width.    Gait and Pivot Turn Pivot turns safely within 3 sec and stops quickly with no loss of balance.    Step Over Obstacle Is able to step over one shoe box (4.5 in total height) without changing gait speed. No evidence of imbalance.    Gait with Narrow Base of Support Ambulates 4-7 steps.    Gait with Eyes Closed Walks 20 ft, uses assistive device, slower speed, mild gait deviations, deviates 6-10 in outside 12 in walkway width. Ambulates 20 ft in less than 9 sec but greater than 7 sec.    Ambulating Backwards Walks 20 ft, uses assistive device, slower speed, mild gait deviations, deviates 6-10 in outside 12 in walkway width.    Steps Alternating feet, must use rail.    Total Score 24    FGA comment: Max score 30  Interpretation: 25-28 = low risk fall   19-24 = medium risk fall  < 19 = high risk fall             Prosthetics Assessment - 11/20/20 0800       Prosthetics   Prosthetic Care Independent with Skin check;Residual limb care;Care of non-amputated limb;Prosthetic cleaning;Ply sock cleaning;Correct ply sock adjustment;Proper wear schedule/adjustment;Proper weight-bearing schedule/adjustment    Donning prosthesis  Modified independent (Device/Increase time)    Doffing prosthesis  Independent    Current prosthetic wear tolerance (days/week)  daily    Current prosthetic wear tolerance (#hours/day)  most of awake hours    Current prosthetic weight-bearing tolerance (hours/day)  Pt tolerated standing for 15 min without limb pain but right hip gets sore.  PT instructed in standing with equal wt on LEs     Residual limb condition  no issues                                    PT Short Term Goals - 10/23/20 0927       PT SHORT TERM GOAL #1   Title Patient demo & verbalize how to donne new socket with suction suspension.    Time 4    Period Weeks    Status Achieved    Target Date 10/24/20      PT SHORT TERM GOAL #2   Title Functional Gait Assessment >/= 15 / 30    Time 4    Period Weeks    Status Achieved    Target Date 10/24/20      PT SHORT TERM GOAL #3   Title Patient ambulates 400' with prosthesis only with supervision.    Time 4    Period Weeks    Status Achieved    Target Date 10/24/20      PT SHORT TERM GOAL #4   Title Patient negotiates stairs alternating pattern with single rail with supervision.    Time 4    Period Weeks    Status Achieved    Target Date 10/24/20               PT Long Term Goals - 11/20/20 0842       PT LONG TERM GOAL #1   Title Patient tolerates wear of  prosthesis with new socket design >90% of awake hours without skin  or limb pain issues and verbalizes proper prosthetic care.    Time 12    Period Weeks    Status Achieved      PT LONG TERM GOAL #2   Title Functional Gait Assessment > 19/30 with prosthesis only to indicate lower fall risk.    Time 12    Period Weeks    Status Achieved      PT LONG TERM GOAL #3   Title Timed Up & Go Cognitive <15 sec with no delay in cognitive task.    Time 12    Period Weeks    Status Achieved      PT LONG TERM GOAL #4   Title Patient ambulates >500' with prosthesis only and negotiates ramps/curbs/stairs single rail modified independent.    Time 12    Period Weeks    Status Achieved      PT LONG TERM GOAL #5   Title Patient verbalizes & demonstrates understanding of how to properly use fitness equipment to return to gym per her goal.    Time 12    Period Weeks    Status Achieved                   Plan - 11/20/20 0801     Clinical Impression  Statement Patient appeasrs to be functioning with prosthesis at full community level.  Berg, Timed Up & Go and Functional Gait Assessment all indicate low fall risk.  Her BP was elevated. She reports was prior to taking her BP meds.  PT advised to monitor BP pre & post meds, then share with her MD to work on better control. Pt verbalized understanding.    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, Thoracotomy with lobectomy 02/24/2018,    Examination-Activity Limitations Locomotion Level;Lift;Squat;Stairs;Stand;Transfers    Examination-Participation Restrictions Community Activity    Stability/Clinical Decision Making Evolving/Moderate complexity    Rehab Potential Good    PT Frequency 2x / week    PT Duration 12 weeks    PT Treatment/Interventions ADLs/Self Care Home Management;DME Instruction;Gait training;Stair training;Functional mobility training;Therapeutic activities;Therapeutic exercise;Balance training;Neuromuscular re-education;Patient/family education;Prosthetic Training    PT Next Visit Plan discharge    Consulted and Agree with Plan of Care Patient             Patient will benefit from skilled therapeutic intervention in order to improve the following deficits and impairments:  Abnormal gait, Decreased activity tolerance, Decreased balance, Decreased endurance, Decreased knowledge of use of DME, Decreased mobility, Decreased range of motion, Increased edema, Postural dysfunction, Prosthetic Dependency  Visit Diagnosis: Muscle weakness (generalized)  Other abnormalities of gait and mobility  Unsteadiness on feet  Abnormal posture     Problem List Patient Active Problem List   Diagnosis Date Noted   Uncontrolled type 2 diabetes mellitus with hyperglycemia, with long-term current use of insulin ([place]) 08/27/2017   Hyperlipidemia  associated with type 2 diabetes mellitus ([place]) 03/18/2017   Chronic renal disease, stage V ([place]) 08/20/2015   Personal history of noncompliance with medical treatment, presenting hazards to health 08/20/2015   Proteinuria 07/04/2014   Uncontrolled type 2 diabetes mellitus with ESRD (end-stage renal disease) ([place]) 05/21/2014   Hyperlipidemia 05/21/2014   HTN (hypertension) 05/21/2014    Jamey Reas, PT, DPT 11/20/2020, 8:46 AM  [facility] Physical Therapy [address] [place], [place], [address] Phone: [phone]   Fax:  [phone]  Name: Shelley [name] MRN: [id number] Date of Birth: 1950-07-03

## 2021-02-25 ENCOUNTER — Encounter: Payer: Self-pay | Admitting: *Deleted

## 2021-10-06 ENCOUNTER — Telehealth: Payer: Self-pay

## 2021-10-06 NOTE — Telephone Encounter (Signed)
Transition Care Management Unsuccessful Follow-up Telephone Call  Date of discharge and from where:  [facility] 10/01/21-10/04/21  Attempts:  1st Attempt  Reason for unsuccessful TCM follow-up call:  Unable to leave message

## 2021-10-07 ENCOUNTER — Other Ambulatory Visit: Payer: Self-pay | Admitting: *Deleted

## 2021-10-07 NOTE — Patient Outreach (Signed)
  Care Coordination [facility] Note Transition Care Management Unsuccessful Follow-up Telephone Call  Date of discharge and from where:  10/04/21 [facility]  Attempts:  2nd Attempt  Reason for unsuccessful TCM follow-up call:  Left voice message.  Emelia Loron RN, BSN [place] [phone] [email or web address]

## 2021-10-08 ENCOUNTER — Other Ambulatory Visit: Payer: Self-pay | Admitting: *Deleted

## 2021-10-08 NOTE — Patient Outreach (Signed)
  Care Coordination [facility] Note Transition Care Management Unsuccessful Follow-up Telephone Call  Date of discharge and from where:  10/04/21 [facility]  Attempts:  3rd Attempt  Reason for unsuccessful TCM follow-up call:  Left voice message.  Emelia Loron RN, BSN [place] [phone] [email or web address]

## 2021-10-08 NOTE — Patient Outreach (Signed)
  Care Coordination [facility] Note Transition Care Management Unsuccessful Follow-up Telephone Call  Date of discharge and from where:  10/04/21 [facility]  Attempts:  2nd Attempt  Reason for unsuccessful TCM follow-up call:  Left voice message.  Emelia Loron RN, BSN [place] [phone] [email or web address]

## 2021-10-14 ENCOUNTER — Ambulatory Visit (HOSPITAL_COMMUNITY)
Admission: RE | Admit: 2021-10-14 | Discharge: 2021-10-14 | Disposition: A | Discharge status: Home / Self Care | Payer: Medicare Other | Source: Ambulatory Visit | Attending: Family Medicine | Admitting: Family Medicine

## 2021-10-14 ENCOUNTER — Ambulatory Visit (INDEPENDENT_AMBULATORY_CARE_PROVIDER_SITE_OTHER): Payer: Medicare Other | Admitting: Family Medicine

## 2021-10-14 ENCOUNTER — Other Ambulatory Visit (HOSPITAL_COMMUNITY)
Admission: RE | Admit: 2021-10-14 | Discharge: 2021-10-14 | Disposition: A | Discharge status: Home / Self Care | Payer: Medicare Other | Source: Ambulatory Visit | Attending: Family Medicine | Admitting: Family Medicine

## 2021-10-14 VITALS — BP 138/58 | HR 79 | Temp 97.9°F | Ht 66.0 in | Wt 200.0 lb

## 2021-10-14 DIAGNOSIS — Z940 Kidney transplant status: Secondary | ICD-10-CM

## 2021-10-14 DIAGNOSIS — N289 Disorder of kidney and ureter, unspecified: Secondary | ICD-10-CM

## 2021-10-14 DIAGNOSIS — R0609 Other forms of dyspnea: Secondary | ICD-10-CM | POA: Diagnosis not present

## 2021-10-14 DIAGNOSIS — R0902 Hypoxemia: Secondary | ICD-10-CM | POA: Diagnosis present

## 2021-10-14 DIAGNOSIS — Z89612 Acquired absence of left leg above knee: Secondary | ICD-10-CM

## 2021-10-14 DIAGNOSIS — J189 Pneumonia, unspecified organism: Secondary | ICD-10-CM | POA: Insufficient documentation

## 2021-10-14 DIAGNOSIS — E1142 Type 2 diabetes mellitus with diabetic polyneuropathy: Secondary | ICD-10-CM

## 2021-10-14 LAB — CBC WITH DIFFERENTIAL/PLATELET
Abs Immature Granulocytes: 0.03 10*3/uL (ref 0.00–0.07)
Basophils Absolute: 0 10*3/uL (ref 0.0–0.1)
Basophils Relative: 0 %
Eosinophils Absolute: 0.1 10*3/uL (ref 0.0–0.5)
Eosinophils Relative: 1 %
HCT: 30.9 % — ABNORMAL LOW (ref 36.0–46.0)
Hemoglobin: 9.3 g/dL — ABNORMAL LOW (ref 12.0–15.0)
Immature Granulocytes: 0 %
Lymphocytes Relative: 17 %
Lymphs Abs: 1.1 10*3/uL (ref 0.7–4.0)
MCH: 29.7 pg (ref 26.0–34.0)
MCHC: 30.1 g/dL (ref 30.0–36.0)
MCV: 98.7 fL (ref 80.0–100.0)
Monocytes Absolute: 0.5 10*3/uL (ref 0.1–1.0)
Monocytes Relative: 8 %
Neutro Abs: 5 10*3/uL (ref 1.7–7.7)
Neutrophils Relative %: 74 %
Platelets: 227 10*3/uL (ref 150–400)
RBC: 3.13 MIL/uL — ABNORMAL LOW (ref 3.87–5.11)
RDW: 17.7 % — ABNORMAL HIGH (ref 11.5–15.5)
WBC: 6.8 10*3/uL (ref 4.0–10.5)
nRBC: 0 % (ref 0.0–0.2)

## 2021-10-14 LAB — BASIC METABOLIC PANEL
Anion gap: 7 (ref 5–15)
BUN: 30 mg/dL — ABNORMAL HIGH (ref 8–23)
CO2: 23 mmol/L (ref 22–32)
Calcium: 9 mg/dL (ref 8.9–10.3)
Chloride: 110 mmol/L (ref 98–111)
Creatinine, Ser: 2.33 mg/dL — ABNORMAL HIGH (ref 0.44–1.00)
GFR, Estimated: 22 mL/min — ABNORMAL LOW (ref >60)
Glucose, Bld: 147 mg/dL — ABNORMAL HIGH (ref 70–99)
Potassium: 3.9 mmol/L (ref 3.5–5.1)
Sodium: 140 mmol/L (ref 135–145)

## 2021-10-14 LAB — BRAIN NATRIURETIC PEPTIDE: B Natriuretic Peptide: 2509 pg/mL — ABNORMAL HIGH (ref 0.0–100.0)

## 2021-10-14 NOTE — Progress Notes (Signed)
   Subjective:    Patient ID: Shelley [name], female    DOB: [birth year], 71 y.o.   MRN: [id number]  HPI Hospitalization follow up for Pneumonia and UTI Transitional care from hospital Patient was in the hospital for pneumonia and UTI sepsis treated with antibiotics.  Since coming home from the hospital has experienced slight cough is also experienced shortness of breath denies any chest pressure or tightness.  She has underlying diabetes, renal transplant, hypertension [facility] CAT scan as well as lab work was reviewed Medication list reviewed Review of Systems     Objective:   Physical Exam Lungs sound clear slight cough noted heart regular amputation left leg no right leg normal       Assessment & Plan:  1. Community acquired pneumonia, unspecified laterality Recently in the hospital with pneumonia sent home on antibiotics O2 saturation is low at 57% - Basic Metabolic Panel - CBC with Differential - Brain natriuretic peptide - DG Chest 2 View  2. Hypoxia 84% Will need home O2 2 L per nasal cannula - Basic Metabolic Panel - CBC with Differential - Brain natriuretic peptide  3. Renal insufficiency History of renal transplant repeat kidney function creatinine was 2.1 when she left [facility] - Basic Metabolic Panel - CBC with Differential - Brain natriuretic peptide  4. Other forms of dyspnea BNP ordered await results - Brain natriuretic peptide  5. Hx of AKA (above knee amputation), left [facility] Patient had amputation and has had rehab  6. Diabetic peripheral neuropathy associated with type 2 diabetes mellitus ([place]) Managed by endocrinology  7. Chronic renal disease, stage V ([place]) Managed by nephrology-had transplant  8. Hx of kidney transplant Transplant at [facility]  Should be noted that her BMP as well as CBC kidney function shows creatinine slightly elevated at 2.33 whereas the baseline is 2.1.  BNP significantly elevated at greater than 2500.  Chest x-ray does show mild  pulmonary edema.  Patient is having some shortness of breath with 84% on O2 saturation.  We discussed that she needs further evaluation in the emergency department.  She agrees to go to the emergency department.  Given that her kidney transplant was at [facility] and recent hospitalization it was at [facility] family will transport her to [facility] for further care

## 2021-10-19 ENCOUNTER — Telehealth: Payer: Self-pay | Admitting: Family Medicine

## 2021-10-19 NOTE — Telephone Encounter (Signed)
Nurses Patient was recently in the hospital I believe she was in the hospital at [facility] between July 25 and July 30 Please connect with her Confirm she is now discharged Set her up for a follow-up visit with me within the next 2 weeks and one of my open slots please thank you

## 2021-10-20 NOTE — Telephone Encounter (Signed)
Please contact patient to set her up for appt. Thank you.

## 2021-10-30 ENCOUNTER — Other Ambulatory Visit: Payer: Self-pay | Admitting: *Deleted

## 2021-10-30 ENCOUNTER — Other Ambulatory Visit: Payer: Self-pay | Admitting: Family Medicine

## 2021-10-30 DIAGNOSIS — Z89612 Acquired absence of left leg above knee: Secondary | ICD-10-CM

## 2021-10-30 DIAGNOSIS — R0902 Hypoxemia: Secondary | ICD-10-CM

## 2021-10-30 DIAGNOSIS — R531 Weakness: Secondary | ICD-10-CM

## 2021-10-30 DIAGNOSIS — J189 Pneumonia, unspecified organism: Secondary | ICD-10-CM

## 2021-10-30 NOTE — Addendum Note (Signed)
Addended by: Vicente Males on: 10/30/2021 04:34 PM   Modules accepted: Orders

## 2021-11-04 ENCOUNTER — Ambulatory Visit (INDEPENDENT_AMBULATORY_CARE_PROVIDER_SITE_OTHER): Payer: Medicare Other | Admitting: Family Medicine

## 2021-11-04 ENCOUNTER — Encounter: Payer: Self-pay | Admitting: Family Medicine

## 2021-11-04 VITALS — BP 117/69 | HR 71 | Temp 97.9°F | Wt 210.2 lb

## 2021-11-04 DIAGNOSIS — Z89612 Acquired absence of left leg above knee: Secondary | ICD-10-CM

## 2021-11-04 DIAGNOSIS — Z940 Kidney transplant status: Secondary | ICD-10-CM | POA: Diagnosis not present

## 2021-11-04 DIAGNOSIS — F321 Major depressive disorder, single episode, moderate: Secondary | ICD-10-CM

## 2021-11-04 MED ORDER — SERTRALINE HCL 50 MG PO TABS: 50.0000 mg | ORAL_TABLET | Freq: Every day | ORAL | 3 refills | Status: DC

## 2021-11-04 NOTE — Progress Notes (Signed)
   Subjective:    Patient ID: Shelley [name], female    DOB: [birth year], 71 y.o.   MRN: [id number]  HPI Pt arrives for hospital follow up. Pt went to [facility] on 10/14/21 and was discharged on 10/19/21. Pt diagnosed with heart failure.  Very nice patient Recently in the hospital with CHF It was fluid overload She is trying to do the best she can with this Find yourself feeling stressed a lot Lack of enjoyment at times Finds himself depressed at times went through transplant and she has had multiple problems since then  Review of Systems     Objective:   Physical Exam Lungs are clear no crackles heart regular extremity on the right side trace edema but no significant swelling on the left side she has above-the-knee amputation  Patient not suicidal Follow-up 6 weeks    Assessment & Plan:  We will do lab work before next visit CHF stable currently No sign of fluid overload Diabetes stable but will check A1c on next visit Follow through with kidney doctor in a couple weeks they will do lab work Major depression moderate recommend Zoloft 50 mg half tablet daily for the first week then 1 thereafter

## 2021-11-12 ENCOUNTER — Other Ambulatory Visit (HOSPITAL_COMMUNITY): Payer: Self-pay | Admitting: Student

## 2021-11-12 ENCOUNTER — Other Ambulatory Visit: Payer: Self-pay | Admitting: Student

## 2021-11-12 DIAGNOSIS — D849 Immunodeficiency, unspecified: Secondary | ICD-10-CM

## 2021-11-12 DIAGNOSIS — Z940 Kidney transplant status: Secondary | ICD-10-CM

## 2021-11-25 ENCOUNTER — Ambulatory Visit (HOSPITAL_COMMUNITY)
Admission: RE | Admit: 2021-11-25 | Discharge: 2021-11-25 | Disposition: A | Discharge status: Home / Self Care | Payer: Medicare Other | Source: Ambulatory Visit | Attending: Student | Admitting: Student

## 2021-11-25 DIAGNOSIS — Z940 Kidney transplant status: Secondary | ICD-10-CM | POA: Insufficient documentation

## 2021-11-25 DIAGNOSIS — D849 Immunodeficiency, unspecified: Secondary | ICD-10-CM | POA: Diagnosis present

## 2021-11-27 ENCOUNTER — Other Ambulatory Visit: Payer: Self-pay | Admitting: Family Medicine

## 2021-12-16 ENCOUNTER — Encounter: Payer: Self-pay | Admitting: Family Medicine

## 2021-12-16 ENCOUNTER — Ambulatory Visit (INDEPENDENT_AMBULATORY_CARE_PROVIDER_SITE_OTHER): Payer: Medicare Other | Admitting: Family Medicine

## 2021-12-16 VITALS — BP 132/74 | Wt 214.0 lb

## 2021-12-16 DIAGNOSIS — Z1231 Encounter for screening mammogram for malignant neoplasm of breast: Secondary | ICD-10-CM | POA: Diagnosis not present

## 2021-12-16 DIAGNOSIS — F324 Major depressive disorder, single episode, in partial remission: Secondary | ICD-10-CM

## 2021-12-16 DIAGNOSIS — Z23 Encounter for immunization: Secondary | ICD-10-CM | POA: Diagnosis not present

## 2021-12-16 DIAGNOSIS — Z780 Asymptomatic menopausal state: Secondary | ICD-10-CM

## 2021-12-16 DIAGNOSIS — R531 Weakness: Secondary | ICD-10-CM

## 2021-12-16 DIAGNOSIS — N185 Chronic kidney disease, stage 5: Secondary | ICD-10-CM

## 2021-12-16 DIAGNOSIS — Z1211 Encounter for screening for malignant neoplasm of colon: Secondary | ICD-10-CM | POA: Diagnosis not present

## 2021-12-16 NOTE — Progress Notes (Signed)
   Subjective:    Patient ID: Shelley [name], female    DOB: Aug 15, 1950, 71 y.o.   MRN: [id number]  HPI Pt arrives for follow up. Pt began Ozempic 3 weeks ago. Pt also reports shakiness all over body. Noticed 3 weeks ago.  She likes a lot of fatigue in her muscles.  Feels rundown at times.  Her legs shake when she gets fatigued She will look into physical therapy and let us know if this is potentially covered Doing well on Zoloft 50 mg daily.  States depression symptoms doing much better  Review of Systems     Objective:   Physical Exam Lungs clear heart regular pulse normal right leg no extremity edema foot exam normal Left leg prosthesis noted      Assessment & Plan:  1. Need for vaccination Today - Flu Vaccine QUAD High Dose(Fluad) - Pneumococcal conjugate vaccine 20-valent (Prevnar 20)  2. Encounter for screening colonoscopy #1 needed - Ambulatory referral to Gastroenterology  3. Encounter for screening mammogram for breast cancer Set up mammogram - MM DIGITAL SCREENING BILATERAL  4. Post-menopausal Bone density recommended - DG Bone Density  Discussion held regarding depression doing better on sertraline  Significant weakness is related to muscle fatigue recommend physical therapy she states she will look into this and then let us know and we will help improve physical therapy  \Patient is gone through transplant but still has creatinine which runs high at 2.6 GFR 19

## 2021-12-18 ENCOUNTER — Telehealth: Payer: Self-pay | Admitting: Family Medicine

## 2021-12-18 DIAGNOSIS — Z89612 Acquired absence of left leg above knee: Secondary | ICD-10-CM

## 2021-12-18 DIAGNOSIS — R531 Weakness: Secondary | ICD-10-CM

## 2021-12-18 NOTE — Telephone Encounter (Signed)
Pt requesting referral to Physical Therapy. Referral to faxed to [phone]. Please advise. Thank you. (Pt did not give name of Physical Therapy office)

## 2021-12-18 NOTE — Telephone Encounter (Signed)
May have referral

## 2021-12-19 NOTE — Addendum Note (Signed)
Addended by: Vicente Males on: 12/19/2021 04:27 PM   Modules accepted: Orders

## 2021-12-19 NOTE — Telephone Encounter (Signed)
Referral placed and pt is aware. 

## 2021-12-25 ENCOUNTER — Encounter: Payer: Self-pay | Admitting: *Deleted

## 2021-12-25 ENCOUNTER — Telehealth: Payer: Self-pay | Admitting: Family Medicine

## 2021-12-25 NOTE — Telephone Encounter (Signed)
Left message for patient to call back and schedule Medicare Annual Wellness Visit (AWV).  Please offer to do virtually or by telephone.  Last AWV: 03/18/2017  Please schedule at any time with [facility] Health Advisor.  30 minute appointment for Virtual or phone  45 minute appointment for  Initial virtual/phone  Any questions, please contact me at [phone]

## 2022-01-01 ENCOUNTER — Ambulatory Visit (HOSPITAL_COMMUNITY)
Admission: RE | Admit: 2022-01-01 | Discharge: 2022-01-01 | Disposition: A | Discharge status: Home / Self Care | Payer: Medicare Other | Source: Ambulatory Visit | Attending: Family Medicine | Admitting: Family Medicine

## 2022-01-01 DIAGNOSIS — Z780 Asymptomatic menopausal state: Secondary | ICD-10-CM | POA: Diagnosis present

## 2022-01-01 DIAGNOSIS — Z1231 Encounter for screening mammogram for malignant neoplasm of breast: Secondary | ICD-10-CM | POA: Diagnosis present

## 2022-01-03 ENCOUNTER — Encounter: Payer: Self-pay | Admitting: Family Medicine

## 2022-01-03 NOTE — Progress Notes (Signed)
Please mail to the patient thank you 

## 2022-01-12 ENCOUNTER — Other Ambulatory Visit: Payer: Self-pay

## 2022-01-12 ENCOUNTER — Telehealth: Payer: Self-pay

## 2022-01-12 DIAGNOSIS — S88919A Complete traumatic amputation of unspecified lower leg, level unspecified, initial encounter: Secondary | ICD-10-CM

## 2022-01-12 DIAGNOSIS — R531 Weakness: Secondary | ICD-10-CM

## 2022-01-12 NOTE — Telephone Encounter (Signed)
PT orders placed per drs notes.

## 2022-01-12 NOTE — Telephone Encounter (Signed)
Patient called and stated that she prefers to be seen at the following for outpatient PT --  [facility] [address] [place], [place], [address] Phone: [phone]   Fax:  [phone]  She requests a new referral , she has seen them in the past .

## 2022-01-12 NOTE — Telephone Encounter (Signed)
May have referral for physical therapy thank you

## 2022-04-17 ENCOUNTER — Ambulatory Visit (INDEPENDENT_AMBULATORY_CARE_PROVIDER_SITE_OTHER): Payer: Medicare Other | Admitting: Family Medicine

## 2022-04-17 VITALS — BP 116/80 | Ht 66.0 in | Wt 208.8 lb

## 2022-04-17 DIAGNOSIS — R531 Weakness: Secondary | ICD-10-CM | POA: Diagnosis not present

## 2022-04-17 DIAGNOSIS — D849 Immunodeficiency, unspecified: Secondary | ICD-10-CM

## 2022-04-17 DIAGNOSIS — E1142 Type 2 diabetes mellitus with diabetic polyneuropathy: Secondary | ICD-10-CM | POA: Diagnosis not present

## 2022-04-17 DIAGNOSIS — Z89612 Acquired absence of left leg above knee: Secondary | ICD-10-CM | POA: Diagnosis not present

## 2022-04-17 DIAGNOSIS — Z1211 Encounter for screening for malignant neoplasm of colon: Secondary | ICD-10-CM

## 2022-04-17 NOTE — Progress Notes (Signed)
   Subjective:    Patient ID: Shelley [name], female    DOB: 12-27-1950, 72 y.o.   MRN: [id number]  HPI Patient arrives for a follow up on depression. Patient states she is doing well except having nausea associated with Ozempic.   Ozempic prescribed by endocrinologist- just had to go back to lowest dose   Review of Systems     Objective:    Amputation noted left leg with above-knee amputation Lungs clear heart regular Moderately overweight Other extremities no edema Blood pressure good       Assessment & Plan:  Depression-doing well stable currently Needs colonoscopy go ahead with referral she would like to get it done in the spring Osteoporosis discussed in detail with the patient Will discuss with her transplant doctor and potentially even the endocrinologist If utilizing Fosamax we can do that if utilizing Prolia this will be through her specialist  Follow-up by June July

## 2022-04-18 DIAGNOSIS — D849 Immunodeficiency, unspecified: Secondary | ICD-10-CM | POA: Insufficient documentation

## 2022-04-21 ENCOUNTER — Encounter: Payer: Self-pay | Admitting: *Deleted

## 2022-05-12 ENCOUNTER — Other Ambulatory Visit: Payer: Self-pay

## 2022-05-12 ENCOUNTER — Ambulatory Visit (INDEPENDENT_AMBULATORY_CARE_PROVIDER_SITE_OTHER): Payer: Medicare Other | Admitting: Physical Therapy

## 2022-05-12 ENCOUNTER — Encounter: Payer: Self-pay | Admitting: Physical Therapy

## 2022-05-12 DIAGNOSIS — R2681 Unsteadiness on feet: Secondary | ICD-10-CM | POA: Diagnosis not present

## 2022-05-12 DIAGNOSIS — Z7409 Other reduced mobility: Secondary | ICD-10-CM | POA: Diagnosis not present

## 2022-05-12 DIAGNOSIS — M6281 Muscle weakness (generalized): Secondary | ICD-10-CM

## 2022-05-12 DIAGNOSIS — R2689 Other abnormalities of gait and mobility: Secondary | ICD-10-CM | POA: Diagnosis not present

## 2022-05-12 NOTE — Therapy (Signed)
OUTPATIENT PHYSICAL THERAPY PROSTHETICS EVALUATION   Patient Name: Shelley [name] MRN: [id number] DOB:May 28, 1950, 72 y.o., female Today's Date: 05/12/2022  PCP: Kathyrn Drown, MD REFERRING PROVIDER: Kathyrn Drown, MD  END OF SESSION:  PT End of Session - 05/12/22 1552     Visit Number 1    Number of Visits 25    Date for PT Re-Evaluation 08/06/22    Authorization Type Medicare & BCBS    Progress Note Due on Visit 10    PT Start Time 1343    PT Stop Time 1430    PT Time Calculation (min) 47 min             Past Medical History:  Diagnosis Date   Diabetes ([place])    Diabetes mellitus without complication ([place])    End stage renal disease ([place])    HTN (hypertension)    Hyperlipidemia    Hypertension    Past Surgical History:  Procedure Laterality Date   ABDOMINAL HYSTERECTOMY     fibroids/complete hysterec 2000   APPENDECTOMY     CATARACT EXTRACTION Bilateral    COLONOSCOPY N/A 03/12/2016   Procedure: COLONOSCOPY;  Surgeon: Daneil Dolin, MD;  [facility];  Service: Endoscopy;  Laterality: N/A;  9:30 am   POLYPECTOMY  03/12/2016   Procedure: POLYPECTOMY;  Surgeon: Daneil Dolin, MD;  Location: [facility];  Service: Endoscopy;;  colon   YAG LASER APPLICATION Left 0000000   Procedure: YAG LASER APPLICATION;  Surgeon: Williams Che, MD;  Location: [facility];  Service: Ophthalmology;  Laterality: Left;   Patient Active Problem List   Diagnosis Date Noted   Immunosuppressed status ([place]) 04/18/2022   Hx of kidney transplant 10/14/2021   Hx of AKA (above knee amputation), left ([place]) 10/14/2021   Diabetic peripheral neuropathy associated with type 2 diabetes mellitus ([place]) 10/14/2021   Uncontrolled type 2 diabetes mellitus with hyperglycemia, with long-term current use of insulin ([place]) 08/27/2017   Hyperlipidemia associated with type 2 diabetes mellitus ([place]) 03/18/2017   Chronic renal disease, stage V ([place]) 08/20/2015   Personal history of  noncompliance with medical treatment, presenting hazards to health 08/20/2015   Proteinuria 07/04/2014   Uncontrolled type 2 diabetes mellitus with ESRD (end-stage renal disease) 05/21/2014   Hyperlipidemia 05/21/2014   HTN (hypertension) 05/21/2014    ONSET DATE: 04/17/2022 MD referral to PT  REFERRING DIAG:  89.612 (ICD-10-CM) - Hx of AKA (above knee amputation), left (HCC)  R53.1 (ICD-10-CM) - Weakness    THERAPY DIAG:  Muscle weakness (generalized)  Other abnormalities of gait and mobility  Unsteadiness on feet  Decreased functional mobility and endurance  Rationale for Evaluation and Treatment: Rehabilitation  SUBJECTIVE:   SUBJECTIVE STATEMENT: She underwent a left Transtibial Amputation on 07/31/2019 with nonhealing left Transmetatarsal Amputation on 06/20/2019. She received her first prosthesis 01/16/2020 She had PT 06/05/2020 - 11/20/2020 for prosthetic training. She received new prosthesis Oct. 2023. She feels something is not right with the prosthesis.  She was referred back to PT on 04/17/2022. She had CHF in September 2023 with hospitalization. She has no stamina now.   PERTINENT HISTORY: DM2, neuropathy, HTN, CHF, renal CA,CKD st 5,  ESRD, kidney transplant 08/30/2017, Thoracotomy with lobectomy 02/24/2018,   PAIN:  Are you having pain? Yes: NPRS scale: today 4/10 and over last week 2/10 up to  8/10 Pain location: left residual limb posteriorly Pain description: spasm, "toothache" Aggravating factors:  prosthesis wear Relieving factors: remove prosthesis including liner within  5-10 min.   PRECAUTIONS: Other: no BP LUE  WEIGHT BEARING RESTRICTIONS: No  FALLS: Has patient fallen in last 6 months? Yes. Number of falls 4 denies injuries, shakes but not low blood sugar.   LIVING ENVIRONMENT: Lives with: lives with their family sister just moved into pt's home Lives in: House  single level Home Access: Stairs to enter Home layout: One level Stairs: Yes: External: 4  steps; on left going up Has following equipment at home: Single point cane, Environmental consultant - 2 wheeled, Environmental consultant - 4 wheeled, Wheelchair (manual), [employer], and Grab bars  PLOF: Independent, Independent with household mobility without device, and Independent with community mobility without device prosthesis only for community activities  PATIENT GOALS:   walk in community. Be active without fear of falling.   OBJECTIVE:  COGNITION: Overall cognitive status: Within functional limits for tasks assessed   SENSATION: WFL  POSTURE: rounded shoulders, forward head, flexed trunk , and weight shift right  LOWER EXTREMITY ROM:  ROM P:passive  A:active Right eval Left eval  Hip flexion    Hip extension    Hip abduction    Hip adduction    Hip internal rotation    Hip external rotation    Knee flexion  WFL  Knee extension  WFL  Ankle dorsiflexion    Ankle plantarflexion    Ankle inversion    Ankle eversion     (Blank rows = not tested)  LOWER EXTREMITY MMT:  MMT Right eval Left eval  Hip flexion 5/5 4/5  Hip extension 4/5 4-/5  Hip abduction 4/5 4-/5  Hip adduction    Hip internal rotation    Hip external rotation    Knee flexion 5/5 4/5  Knee extension 5/5 4/5  Ankle dorsiflexion    Ankle plantarflexion    Ankle inversion    Ankle eversion    (Blank rows = not tested)  TRANSFERS: Sit to stand: Modified independence requires UE assist on armrests Stand to sit: Modified independence requires UE assist on armrests  GAIT: Gait pattern: step through pattern, decreased arm swing- Left, decreased step length- Right, decreased stance time- Left, decreased hip/knee flexion- Left, Left hip hike, antalgic, and abducted- Left Distance walked: 120' Assistive device utilized: arrived using rollator walker;  assessed with cane & no device. Level of assistance: cane CGA and no device Min A Gait velocity: 2.51 ft/sec with cane &  2.84 ft/sec no device (MinA)  FUNCTIONAL TESTs:   Timed up and go (TUG):  standard 14.40sec & Cognitive 18.69sec   Both with cane. Berg Balance Scale: 38/56  [facility] PT Assessment - 05/12/22 1345       Standardized Balance Assessment   Standardized Balance Assessment Berg Balance Test;Timed Up and Go Test      Berg Balance Test   Sit to Stand Able to stand  independently using hands    Standing Unsupported Able to stand safely 2 minutes    Sitting with Back Unsupported but Feet Supported on Floor or Stool Able to sit safely and securely 2 minutes    Stand to Sit Sits safely with minimal use of hands    Transfers Able to transfer safely, minor use of hands    Standing Unsupported with Eyes Closed Able to stand 10 seconds safely    Standing Unsupported with Feet Together Able to place feet together independently and stand 1 minute safely    From Standing, Reach Forward with Outstretched Arm Can reach forward >12 cm safely (5")  From Standing Position, Pick up Object from [place] to pick up shoe, needs supervision    From Standing Position, Turn to Look Behind Over each Shoulder Turn sideways only but maintains balance    Turn 360 Degrees Needs close supervision or verbal cueing    Standing Unsupported, Alternately Place Feet on Step/Stool Needs assistance to keep from falling or unable to try    Standing Unsupported, One Foot in Front Needs help to step but can hold 15 seconds    Standing on One Leg Tries to lift leg/unable to hold 3 seconds but remains standing independently    Total Score 38    Berg comment: BERG  < 36 high risk for falls (close to 100%) 46-51 moderate (>50%)   37-45 significant (>80%) 52-55 lower (> 25%)      Timed Up and Go Test   Normal TUG (seconds) 14.4   cane & TTA prosthesis   Cognitive TUG (seconds) 18.69   cane & TTA prosthesis, naming states visited   TUG Comments TUG:  Normal: >13.5 sec indicates high fall risk  Cognitive: >15 sec indicates high fall risk              CARDIOVASCULAR  RESPONSE: Functional activity: Berg & Gait assessment Pre-activity vitals: HR: 76 SpO2: 94% Post-activity vitals: HR: 84 SpO2: 95%   CURRENT PROSTHETIC WEAR ASSESSMENT:  Patient is independent with: skin check, residual limb care, care of non-amputated limb, prosthetic cleaning, correct ply sock adjustment, and proper wear schedule/adjustment Patient is dependent with: donning & managing edema / limb volume Donning prosthesis: SBA Doffing prosthesis: Modified independence Prosthetic wear tolerance: 10-12 hours of 15-16 awake hours (62-80% of awake hours) limited by pain Prosthetic weight bearing tolerance: 5 minutes limited by fatigue Edema: pitting Residual limb condition: no open areas, no hair growth, normal color, temperature & moisture, cylindrical shape Prosthetic description: silicon gel liner, suction pin sleeve with flexible inner socket supspension, total contact socket, dynamic response foot K code/activity level with prosthetic use: Level 3    TODAY'S TREATMENT:                                                                                                                             DATE:  05/12/2022: see pt education on prosthetic care below.   PATIENT EDUCATION: PATIENT EDUCATED ON FOLLOWING PROSTHETIC CARE: Education details: donning to seat limb in socket & minimize pistoning and use of compression sock overnight / when out of prosthesis to maintain limb volume.   Person educated: Patient Education method: Explanation, Demonstration, Tactile cues, and Verbal cues Education comprehension: verbalized understanding, returned demonstration, verbal cues required, tactile cues required, and needs further education  HOME EXERCISE PROGRAM:  ASSESSMENT:  CLINICAL IMPRESSION: Patient is a 72 y.o. female who was seen today for physical therapy evaluation and treatment for prosthetic training with left Transtibial Amputation. She has deconditioning with fatigue with 5 min  of standing.  She has residual limb  pain limiting her prosthesis wear tolerance & functional mobility. She has weakness in left lower extremity impacting function. Patient has history of multiple falls and Berg / Timed Up & Go indicate high fall risk.  Patient has gait deviations indicating high fall risk.  Pt would benefit from skilled PT to improve function, activity tolerance & decrease fall risk.   OBJECTIVE IMPAIRMENTS: Abnormal gait, cardiopulmonary status limiting activity, decreased activity tolerance, decreased balance, decreased endurance, decreased knowledge of use of DME, decreased mobility, difficulty walking, decreased strength, increased edema, postural dysfunction, prosthetic dependency , and pain.   ACTIVITY LIMITATIONS: carrying, lifting, sitting, standing, squatting, stairs, transfers, and locomotion level  PARTICIPATION LIMITATIONS: meal prep, driving, shopping, and community activity  PERSONAL FACTORS: Fitness, Time since onset of injury/illness/exacerbation, and 3+ comorbidities: see PMH  are also affecting patient's functional outcome.   REHAB POTENTIAL: Good  CLINICAL DECISION MAKING: Evolving/moderate complexity  EVALUATION COMPLEXITY: Moderate   GOALS: Goals reviewed with patient? Yes  SHORT TERM GOALS: Target date: 06/11/2022  Patient donnes prosthesis correctly modified independent & verbalizes proper use of compression shrinker. Baseline: SEE OBJECTIVE DATA Goal status: INITIAL 2.  Patient tolerates prosthesis >12 hrs total /day without skin issues or limb pain <3/10 after standing. Baseline: SEE OBJECTIVE DATA Goal status: INITIAL  3. Timed Up & Go with cane <13.5 sec Baseline: SEE OBJECTIVE DATA Goal status: INITIAL  4. Patient ambulates 150' with cane & prosthesis with supervision. Baseline: SEE OBJECTIVE DATA Goal status: INITIAL  5. Patient demo & verbalizes understanding of initial HEP Baseline: SEE OBJECTIVE DATA Goal status: INITIAL  LONG  TERM GOALS: Target date: 08/06/2022  Patient demonstrates & verbalized understanding of prosthetic care to enable safe utilization of prosthesis. Baseline: SEE OBJECTIVE DATA Goal status: INITIAL  Patient tolerates prosthesis wear >90% of awake hours without skin or limb pain issues. Baseline: SEE OBJECTIVE DATA Goal status: INITIAL  Berg Balance >46/56 Baseline: SEE OBJECTIVE DATA Goal status: INITIAL  Patient ambulates >500' with prosthesis and cane or less independently Baseline: SEE OBJECTIVE DATA Goal status: INITIAL  Patient negotiates ramps, curbs & stairs with single rail with prosthesis and cane or less independently. Baseline: SEE OBJECTIVE DATA Goal status: INITIAL  Patient verbalizes & demonstrates understanding of how to properly use fitness equipment to return to gym. Baseline: SEE OBJECTIVE DATA Goal status: INITIAL  PLAN:  PT FREQUENCY: 2x/week  PT DURATION: 12 weeks  PLANNED INTERVENTIONS: Therapeutic exercises, Therapeutic activity, Neuromuscular re-education, Balance training, Gait training, Patient/Family education, Self Care, Stair training, Vestibular training, Canalith repositioning, Prosthetic training, DME instructions, and physical performance testing  PLAN FOR NEXT SESSION: HEP for balance near sink. Instruct in walking program. Review donning & ask if use shrinker when prosthesis off.   Jamey Reas, PT, DPT 05/12/2022, 3:55 PM

## 2022-05-18 ENCOUNTER — Ambulatory Visit (INDEPENDENT_AMBULATORY_CARE_PROVIDER_SITE_OTHER): Payer: Medicare Other | Admitting: Physical Therapy

## 2022-05-18 ENCOUNTER — Encounter: Payer: Self-pay | Admitting: Physical Therapy

## 2022-05-18 DIAGNOSIS — R2689 Other abnormalities of gait and mobility: Secondary | ICD-10-CM | POA: Diagnosis not present

## 2022-05-18 DIAGNOSIS — Z7409 Other reduced mobility: Secondary | ICD-10-CM | POA: Diagnosis not present

## 2022-05-18 DIAGNOSIS — R2681 Unsteadiness on feet: Secondary | ICD-10-CM | POA: Diagnosis not present

## 2022-05-18 DIAGNOSIS — M6281 Muscle weakness (generalized): Secondary | ICD-10-CM | POA: Diagnosis not present

## 2022-05-18 DIAGNOSIS — R293 Abnormal posture: Secondary | ICD-10-CM

## 2022-05-18 NOTE — Patient Instructions (Addendum)
Increasing your activity level is important.  Short distances which is walking from one room to another. Work to increase frequency back to prior level.  Long distance is your highest tolerance for you. Walk until you feel you must rest. Back or leg pain or general fatigue are indicators to maximum tolerance. Monitor by distance or time. Try to walk your BEST distance 1 times per day. You should see this increase over time.   Medium distances are entering & exiting your home or community with limited distances. Start with 4 medium walks which is one outing to one location and increase number of tolerated amounts per day.

## 2022-05-18 NOTE — Therapy (Signed)
OUTPATIENT PHYSICAL THERAPY TREATMENT   Patient Name: Shelley [name] MRN: [id number] DOB:1950/07/24, 72 y.o., female Today's Date: 05/18/2022  PCP: Kathyrn Drown, MD REFERRING PROVIDER: Kathyrn Drown, MD  END OF SESSION:  PT End of Session - 05/18/22 1306     Visit Number 2    Number of Visits 25    Date for PT Re-Evaluation 08/06/22    Authorization Type Medicare & BCBS    Progress Note Due on Visit 10    PT Start Time 1302    PT Stop Time [id number]    PT Time Calculation (min) 47 min             Past Medical History:  Diagnosis Date   Diabetes ([place])    Diabetes mellitus without complication ([place])    End stage renal disease ([place])    HTN (hypertension)    Hyperlipidemia    Hypertension    Past Surgical History:  Procedure Laterality Date   ABDOMINAL HYSTERECTOMY     fibroids/complete hysterec 2000   APPENDECTOMY     CATARACT EXTRACTION Bilateral    COLONOSCOPY N/A 03/12/2016   Procedure: COLONOSCOPY;  Surgeon: Daneil Dolin, MD;  [facility];  Service: Endoscopy;  Laterality: N/A;  9:30 am   POLYPECTOMY  03/12/2016   Procedure: POLYPECTOMY;  Surgeon: Daneil Dolin, MD;  Location: [facility];  Service: Endoscopy;;  colon   YAG LASER APPLICATION Left 0000000   Procedure: YAG LASER APPLICATION;  Surgeon: Williams Che, MD;  Location: [facility];  Service: Ophthalmology;  Laterality: Left;   Patient Active Problem List   Diagnosis Date Noted   Immunosuppressed status ([place]) 04/18/2022   Hx of kidney transplant 10/14/2021   Hx of AKA (above knee amputation), left ([place]) 10/14/2021   Diabetic peripheral neuropathy associated with type 2 diabetes mellitus ([place]) 10/14/2021   Uncontrolled type 2 diabetes mellitus with hyperglycemia, with long-term current use of insulin ([place]) 08/27/2017   Hyperlipidemia associated with type 2 diabetes mellitus ([place]) 03/18/2017   Chronic renal disease, stage V ([place]) 08/20/2015   Personal history of noncompliance with  medical treatment, presenting hazards to health 08/20/2015   Proteinuria 07/04/2014   Uncontrolled type 2 diabetes mellitus with ESRD (end-stage renal disease) 05/21/2014   Hyperlipidemia 05/21/2014   HTN (hypertension) 05/21/2014    ONSET DATE: 04/17/2022 MD referral to PT  REFERRING DIAG:  89.612 (ICD-10-CM) - Hx of AKA (above knee amputation), left (HCC)  R53.1 (ICD-10-CM) - Weakness    THERAPY DIAG:  Muscle weakness (generalized)  Other abnormalities of gait and mobility  Unsteadiness on feet  Decreased functional mobility and endurance  Abnormal posture  Rationale for Evaluation and Treatment: Rehabilitation  SUBJECTIVE:   SUBJECTIVE STATEMENT: She tried to walk more over weekend in her house.   PERTINENT HISTORY: DM2, neuropathy, HTN, CHF, renal CA,CKD st 5,  ESRD, kidney transplant 08/30/2017, Thoracotomy with lobectomy 02/24/2018,   PAIN:  Are you having pain? Yes: NPRS scale: today 0/10 and over last week up to  8/10 Pain location: left residual limb posteriorly Pain description: spasm, "toothache" Aggravating factors:  prosthesis wear Relieving factors: remove prosthesis including liner within 5-10 min.   PRECAUTIONS: Other: no BP LUE  WEIGHT BEARING RESTRICTIONS: No  FALLS: Has patient fallen in last 6 months? Yes. Number of falls 4 denies injuries, shakes but not low blood sugar.   LIVING ENVIRONMENT: Lives with: lives with their family sister just moved into pt's home Lives in: [place]  single level Home Access: Stairs to enter Home layout: One level Stairs: Yes: External: 4 steps; on left going up Has following equipment at home: Single point cane, Walker - 2 wheeled, Environmental consultant - 4 wheeled, Wheelchair (manual), [employer], and Grab bars  PLOF: Independent, Independent with household mobility without device, and Independent with community mobility without device prosthesis only for community activities  PATIENT GOALS:   walk in community. Be active  without fear of falling.   OBJECTIVE:  COGNITION: Overall cognitive status: Within functional limits for tasks assessed   SENSATION: WFL  POSTURE: rounded shoulders, forward head, flexed trunk , and weight shift right  LOWER EXTREMITY ROM:  ROM P:passive  A:active Right eval Left eval  Hip flexion    Hip extension    Hip abduction    Hip adduction    Hip internal rotation    Hip external rotation    Knee flexion  WFL  Knee extension  WFL  Ankle dorsiflexion    Ankle plantarflexion    Ankle inversion    Ankle eversion     (Blank rows = not tested)  LOWER EXTREMITY MMT:  MMT Right eval Left eval  Hip flexion 5/5 4/5  Hip extension 4/5 4-/5  Hip abduction 4/5 4-/5  Hip adduction    Hip internal rotation    Hip external rotation    Knee flexion 5/5 4/5  Knee extension 5/5 4/5  Ankle dorsiflexion    Ankle plantarflexion    Ankle inversion    Ankle eversion    (Blank rows = not tested)  TRANSFERS: Sit to stand: Modified independence requires UE assist on armrests Stand to sit: Modified independence requires UE assist on armrests  GAIT: Gait pattern: step through pattern, decreased arm swing- Left, decreased step length- Right, decreased stance time- Left, decreased hip/knee flexion- Left, Left hip hike, antalgic, and abducted- Left Distance walked: 120' Assistive device utilized: arrived using rollator walker;  assessed with cane & no device. Level of assistance: cane CGA and no device Min A Gait velocity: 2.51 ft/sec with cane &  2.84 ft/sec no device (MinA)  FUNCTIONAL TESTs:  Timed up and go (TUG):  standard 14.40sec & Cognitive 18.69sec   Both with cane. Berg Balance Scale: 38/56     CARDIOVASCULAR RESPONSE: Functional activity: Berg & Gait assessment Pre-activity vitals: HR: 76 SpO2: 94% Post-activity vitals: HR: 84 SpO2: 95%   CURRENT PROSTHETIC WEAR ASSESSMENT:  Patient is independent with: skin check, residual limb care, care of  non-amputated limb, prosthetic cleaning, correct ply sock adjustment, and proper wear schedule/adjustment Patient is dependent with: donning & managing edema / limb volume Donning prosthesis: SBA Doffing prosthesis: Modified independence Prosthetic wear tolerance: 10-12 hours of 15-16 awake hours (62-80% of awake hours) limited by pain Prosthetic weight bearing tolerance: 5 minutes limited by fatigue Edema: pitting Residual limb condition: no open areas, no hair growth, normal color, temperature & moisture, cylindrical shape Prosthetic description: silicon gel liner, suction pin sleeve with flexible inner socket supspension, total contact socket, dynamic response foot K code/activity level with prosthetic use: Level 3    TODAY'S TREATMENT:  DATE:  05/18/2022: Therapeutic Activities: PT instructed in walking program to increase activity tolerance. See HEP below. Pt verbalized understanding. Sitting on 24" bar stool with feet planted on floor building tolerance to upright trunk. Pt verbalized & return demo sit to/from stand.  PT instructed in HEP with verbal, demo & HO cues. See HEP below. Pt verbalized & return demo understanding.   Prosthetic Training with TTA prosthesis:  PT reviewed donning recommendations which pt reports using since PT eval.  She c/o burning over knee cap when sitting. PT instructed friction burn from liner. Recommended applying baby oil lightly over area.   She also reports interface gel liner hurts at upper edge & leaves indention in skin.  It has caused "welts" to area.  PT demo & verbal cues on using cut off sock from proximal to knee to 1" above interface liner upper border. Pt verbalized understanding.     05/12/2022: see pt education on prosthetic care below.   PATIENT EDUCATION: PATIENT EDUCATED ON FOLLOWING PROSTHETIC CARE: Education  details: donning to seat limb in socket & minimize pistoning and use of compression sock overnight / when out of prosthesis to maintain limb volume.   Person educated: Patient Education method: Explanation, Demonstration, Tactile cues, and Verbal cues Education comprehension: verbalized understanding, returned demonstration, verbal cues required, tactile cues required, and needs further education  HOME EXERCISE PROGRAM: Access Code: [email or web address] URL: [email or web address] Date: 05/18/2022 Prepared by: Jamey Reas  Exercises - Tandem Walking with Counter Support  - 1 x daily - 5-7 x weekly - 1 sets - 3-5 reps - Backward Walking with Counter Support  - 1 x daily - 5-7 x weekly - 1 sets - 3-5 reps - [place] with Counter Support  - 1 x daily - 5-7 x weekly - 1 sets - 3-5 reps - Standing Tandem Balance with Counter Support  - 1 x daily - 5-7 x weekly - 1 sets - 1 reps - 50 seconds hold - Standing Rolling Forward & Back, Side to Side and Circles  - 1 x daily - 5-7 x weekly - 1 sets - 10 reps  Increasing your activity level is important.  Short distances which is walking from one room to another. Work to increase frequency back to prior level.  Long distance is your highest tolerance for you. Walk until you feel you must rest. Back or leg pain or general fatigue are indicators to maximum tolerance. Monitor by distance or time. Try to walk your BEST distance 1 times per day. You should see this increase over time.   Medium distances are entering & exiting your home or community with limited distances. Start with 4 medium walks which is one outing to one location and increase number of tolerated amounts per day.  ASSESSMENT: CLINICAL IMPRESSION: Patient appears to understand prosthetic recommendations to improve skin irritations. PT instructed in HEP to build activity tolerance including walking, sitting on bar stool & HEP. She appears to understand.  OBJECTIVE IMPAIRMENTS: Abnormal  gait, cardiopulmonary status limiting activity, decreased activity tolerance, decreased balance, decreased endurance, decreased knowledge of use of DME, decreased mobility, difficulty walking, decreased strength, increased edema, postural dysfunction, prosthetic dependency , and pain.   ACTIVITY LIMITATIONS: carrying, lifting, sitting, standing, squatting, stairs, transfers, and locomotion level  PARTICIPATION LIMITATIONS: meal prep, driving, shopping, and community activity  PERSONAL FACTORS: Fitness, Time since onset of injury/illness/exacerbation, and 3+ comorbidities: see PMH  are also affecting patient's functional outcome.   REHAB POTENTIAL: Good  CLINICAL DECISION MAKING:  Evolving/moderate complexity  EVALUATION COMPLEXITY: Moderate   GOALS: Goals reviewed with patient? Yes  SHORT TERM GOALS: Target date: 06/11/2022  Patient donnes prosthesis correctly modified independent & verbalizes proper use of compression shrinker. Baseline: SEE OBJECTIVE DATA Goal status: Ongoing 05/18/2022 2.  Patient tolerates prosthesis >12 hrs total /day without skin issues or limb pain <3/10 after standing. Baseline: SEE OBJECTIVE DATA Goal status:  Ongoing 05/18/2022  3. Timed Up & Go with cane <13.5 sec Baseline: SEE OBJECTIVE DATA Goal status:  Ongoing 05/18/2022  4. Patient ambulates 150' with cane & prosthesis with supervision. Baseline: SEE OBJECTIVE DATA Goal status:  Ongoing 05/18/2022  5. Patient demo & verbalizes understanding of initial HEP Baseline: SEE OBJECTIVE DATA Goal status:  Ongoing 05/18/2022  LONG TERM GOALS: Target date: 08/06/2022  Patient demonstrates & verbalized understanding of prosthetic care to enable safe utilization of prosthesis. Baseline: SEE OBJECTIVE DATA Goal status:  Ongoing 05/18/2022  Patient tolerates prosthesis wear >90% of awake hours without skin or limb pain issues. Baseline: SEE OBJECTIVE DATA Goal status: Ongoing 05/18/2022  Merrilee Jansky Balance  >46/56 Baseline: SEE OBJECTIVE DATA Goal status:  Ongoing 05/18/2022  Patient ambulates >500' with prosthesis and cane or less independently Baseline: SEE OBJECTIVE DATA Goal status: Ongoing 05/18/2022  Patient negotiates ramps, curbs & stairs with single rail with prosthesis and cane or less independently. Baseline: SEE OBJECTIVE DATA Goal status:  Ongoing 05/18/2022  Patient verbalizes & demonstrates understanding of how to properly use fitness equipment to return to gym. Baseline: SEE OBJECTIVE DATA Goal status:  Ongoing 05/18/2022  PLAN:  PT FREQUENCY: 2x/week  PT DURATION: 12 weeks  PLANNED INTERVENTIONS: Therapeutic exercises, Therapeutic activity, Neuromuscular re-education, Balance training, Gait training, Patient/Family education, Self Care, Stair training, Vestibular training, Canalith repositioning, Prosthetic training, DME instructions, and physical performance testing  PLAN FOR NEXT SESSION: check on HEP, check on prosthetic care recommendations made 2/26, balance activities.   Jamey Reas, PT, DPT 05/18/2022, 2:00 PM

## 2022-05-19 ENCOUNTER — Ambulatory Visit (INDEPENDENT_AMBULATORY_CARE_PROVIDER_SITE_OTHER): Payer: Medicare Other | Admitting: Physical Therapy

## 2022-05-19 ENCOUNTER — Encounter: Payer: Self-pay | Admitting: Physical Therapy

## 2022-05-19 DIAGNOSIS — Z7409 Other reduced mobility: Secondary | ICD-10-CM | POA: Diagnosis not present

## 2022-05-19 DIAGNOSIS — R2689 Other abnormalities of gait and mobility: Secondary | ICD-10-CM

## 2022-05-19 DIAGNOSIS — M6281 Muscle weakness (generalized): Secondary | ICD-10-CM | POA: Diagnosis not present

## 2022-05-19 DIAGNOSIS — R293 Abnormal posture: Secondary | ICD-10-CM

## 2022-05-19 DIAGNOSIS — R2681 Unsteadiness on feet: Secondary | ICD-10-CM | POA: Diagnosis not present

## 2022-05-19 NOTE — Therapy (Signed)
OUTPATIENT PHYSICAL THERAPY TREATMENT   Patient Name: Shelley [name] MRN: [id number] DOB:1950/05/02, 72 y.o., female Today's Date: 05/19/2022  PCP: Kathyrn Drown, MD REFERRING PROVIDER: Kathyrn Drown, MD  END OF SESSION:  PT End of Session - 05/19/22 1301     Visit Number 3    Number of Visits 25    Date for PT Re-Evaluation 08/06/22    Authorization Type Medicare & BCBS    Progress Note Due on Visit 10    PT Start Time 1300    PT Stop Time 1344    PT Time Calculation (min) 44 min             Past Medical History:  Diagnosis Date   Diabetes ([place])    Diabetes mellitus without complication ([place])    End stage renal disease ([place])    HTN (hypertension)    Hyperlipidemia    Hypertension    Past Surgical History:  Procedure Laterality Date   ABDOMINAL HYSTERECTOMY     fibroids/complete hysterec 2000   APPENDECTOMY     CATARACT EXTRACTION Bilateral    COLONOSCOPY N/A 03/12/2016   Procedure: COLONOSCOPY;  Surgeon: Daneil Dolin, MD;  [facility];  Service: Endoscopy;  Laterality: N/A;  9:30 am   POLYPECTOMY  03/12/2016   Procedure: POLYPECTOMY;  Surgeon: Daneil Dolin, MD;  Location: [facility];  Service: Endoscopy;;  colon   YAG LASER APPLICATION Left 0000000   Procedure: YAG LASER APPLICATION;  Surgeon: Williams Che, MD;  Location: [facility];  Service: Ophthalmology;  Laterality: Left;   Patient Active Problem List   Diagnosis Date Noted   Immunosuppressed status ([place]) 04/18/2022   Hx of kidney transplant 10/14/2021   Hx of AKA (above knee amputation), left ([place]) 10/14/2021   Diabetic peripheral neuropathy associated with type 2 diabetes mellitus ([place]) 10/14/2021   Uncontrolled type 2 diabetes mellitus with hyperglycemia, with long-term current use of insulin ([place]) 08/27/2017   Hyperlipidemia associated with type 2 diabetes mellitus ([place]) 03/18/2017   Chronic renal disease, stage V ([place]) 08/20/2015   Personal history of noncompliance with  medical treatment, presenting hazards to health 08/20/2015   Proteinuria 07/04/2014   Uncontrolled type 2 diabetes mellitus with ESRD (end-stage renal disease) 05/21/2014   Hyperlipidemia 05/21/2014   HTN (hypertension) 05/21/2014    ONSET DATE: 04/17/2022 MD referral to PT  REFERRING DIAG:  89.612 (ICD-10-CM) - Hx of AKA (above knee amputation), left (HCC)  R53.1 (ICD-10-CM) - Weakness    THERAPY DIAG:  Muscle weakness (generalized)  Other abnormalities of gait and mobility  Decreased functional mobility and endurance  Unsteadiness on feet  Abnormal posture  Rationale for Evaluation and Treatment: Rehabilitation  SUBJECTIVE:   SUBJECTIVE STATEMENT: She had some soreness from yesterday.  She applied baby oil to knee area last night & this morning which helped.  The stockinette under the proximal under-liner helped.   PERTINENT HISTORY: DM2, neuropathy, HTN, CHF, renal CA,CKD st 5,  ESRD, kidney transplant 08/30/2017, Thoracotomy with lobectomy 02/24/2018,   PAIN:  Are you having pain? Yes: NPRS scale: today 0/10 and over last week up to 8/10 Pain location: left residual limb posteriorly Pain description: spasm, "toothache" Aggravating factors:  prosthesis wear Relieving factors: remove prosthesis including liner within 5-10 min.   PRECAUTIONS: Other: no BP LUE  WEIGHT BEARING RESTRICTIONS: No  FALLS: Has patient fallen in last 6 months? Yes. Number of falls 4 denies injuries, shakes but not low blood sugar.  LIVING ENVIRONMENT: Lives with: lives with their family sister just moved into pt's home Lives in: House  single level Home Access: Stairs to enter Home layout: One level Stairs: Yes: External: 4 steps; on left going up Has following equipment at home: Single point cane, Environmental consultant - 2 wheeled, Environmental consultant - 4 wheeled, Wheelchair (manual), [employer], and Grab bars  PLOF: Independent, Independent with household mobility without device, and Independent with community  mobility without device prosthesis only for community activities  PATIENT GOALS:   walk in community. Be active without fear of falling.   OBJECTIVE:  COGNITION: Overall cognitive status: Within functional limits for tasks assessed   SENSATION: WFL  POSTURE: rounded shoulders, forward head, flexed trunk , and weight shift right  LOWER EXTREMITY ROM:  ROM P:passive  A:active Right eval Left eval  Hip flexion    Hip extension    Hip abduction    Hip adduction    Hip internal rotation    Hip external rotation    Knee flexion  WFL  Knee extension  WFL  Ankle dorsiflexion    Ankle plantarflexion    Ankle inversion    Ankle eversion     (Blank rows = not tested)  LOWER EXTREMITY MMT:  MMT Right eval Left eval  Hip flexion 5/5 4/5  Hip extension 4/5 4-/5  Hip abduction 4/5 4-/5  Hip adduction    Hip internal rotation    Hip external rotation    Knee flexion 5/5 4/5  Knee extension 5/5 4/5  Ankle dorsiflexion    Ankle plantarflexion    Ankle inversion    Ankle eversion    (Blank rows = not tested)  TRANSFERS: Sit to stand: Modified independence requires UE assist on armrests Stand to sit: Modified independence requires UE assist on armrests  GAIT: Gait pattern: step through pattern, decreased arm swing- Left, decreased step length- Right, decreased stance time- Left, decreased hip/knee flexion- Left, Left hip hike, antalgic, and abducted- Left Distance walked: 120' Assistive device utilized: arrived using rollator walker;  assessed with cane & no device. Level of assistance: cane CGA and no device Min A Gait velocity: 2.51 ft/sec with cane &  2.84 ft/sec no device (MinA)  FUNCTIONAL TESTs:  Timed up and go (TUG):  standard 14.40sec & Cognitive 18.69sec   Both with cane. Berg Balance Scale: 38/56    CARDIOVASCULAR RESPONSE: Functional activity: Berg & Gait assessment Pre-activity vitals: HR: 76 SpO2: 94% Post-activity vitals: HR: 84 SpO2:  95%   CURRENT PROSTHETIC WEAR ASSESSMENT:  Patient is independent with: skin check, residual limb care, care of non-amputated limb, prosthetic cleaning, correct ply sock adjustment, and proper wear schedule/adjustment Patient is dependent with: donning & managing edema / limb volume Donning prosthesis: SBA Doffing prosthesis: Modified independence Prosthetic wear tolerance: 10-12 hours of 15-16 awake hours (62-80% of awake hours) limited by pain Prosthetic weight bearing tolerance: 5 minutes limited by fatigue Edema: pitting Residual limb condition: no open areas, no hair growth, normal color, temperature & moisture, cylindrical shape Prosthetic description: silicon gel liner, suction pin sleeve with flexible inner socket supspension, total contact socket, dynamic response foot K code/activity level with prosthetic use: Level 3    TODAY'S TREATMENT:  DATE:  05/19/2022: Therapeutic Exercise: Nustep seat 10 level 5 with BLEs / BUEs 8 min.  Neuromuscular Re-education: Sit to / from stand from 24" bar stool without UE assist 10 reps. PT demo & verbal cues on technique. Tactile, verbal & mirror for equal weight bearing LEs with pelvic weight shift.  Standing crossways on foam beam: eyes open head turns 4 directions with minA;  static eyes closed 10 sec 3 reps.  Prosthetic Training:  PT demo & verbal cues on rollator walker use & safety.  She had assembled her rollator with casters in back.  PT switched wheels to correct location. Pt amb >200', neg ramp & curb and sat on rollator seat.  PT instructed in donning sock with new suction sleeve suspension.  PT began verbal instruction in adjusting ply socks.    05/18/2022: Therapeutic Activities: PT instructed in walking program to increase activity tolerance. See HEP below. Pt verbalized understanding. Sitting on 24" bar  stool with feet planted on floor building tolerance to upright trunk. Pt verbalized & return demo sit to/from stand.  PT instructed in HEP with verbal, demo & HO cues. See HEP below. Pt verbalized & return demo understanding.   Prosthetic Training with TTA prosthesis:  PT reviewed donning recommendations which pt reports using since PT eval.  She c/o burning over knee cap when sitting. PT instructed friction burn from liner. Recommended applying baby oil lightly over area.   She also reports interface gel liner hurts at upper edge & leaves indention in skin.  It has caused "welts" to area.  PT demo & verbal cues on using cut off sock from proximal to knee to 1" above interface liner upper border. Pt verbalized understanding.     05/12/2022: see pt education on prosthetic care below.   PATIENT EDUCATION: PATIENT EDUCATED ON FOLLOWING PROSTHETIC CARE: Education details: donning to seat limb in socket & minimize pistoning and use of compression sock overnight / when out of prosthesis to maintain limb volume.   Person educated: Patient Education method: Explanation, Demonstration, Tactile cues, and Verbal cues Education comprehension: verbalized understanding, returned demonstration, verbal cues required, tactile cues required, and needs further education  HOME EXERCISE PROGRAM: Access Code: [email or web address] URL: [email or web address] Date: 05/18/2022 Prepared by: Jamey Reas  Exercises - Tandem Walking with Counter Support  - 1 x daily - 5-7 x weekly - 1 sets - 3-5 reps - Backward Walking with Counter Support  - 1 x daily - 5-7 x weekly - 1 sets - 3-5 reps - [place] with Counter Support  - 1 x daily - 5-7 x weekly - 1 sets - 3-5 reps - Standing Tandem Balance with Counter Support  - 1 x daily - 5-7 x weekly - 1 sets - 1 reps - 50 seconds hold - Standing Rolling Forward & Back, Side to Side and Circles  - 1 x daily - 5-7 x weekly - 1 sets - 10 reps  Increasing your activity level  is important.  Short distances which is walking from one room to another. Work to increase frequency back to prior level.  Long distance is your highest tolerance for you. Walk until you feel you must rest. Back or leg pain or general fatigue are indicators to maximum tolerance. Monitor by distance or time. Try to walk your BEST distance 1 times per day. You should see this increase over time.   Medium distances are entering & exiting your home or community with limited distances. Start with 4 medium walks  which is one outing to one location and increase number of tolerated amounts per day.  ASSESSMENT: CLINICAL IMPRESSION: PT began balance activities which challenged her.  PT also instructed in rollator walker use for safety.  She appears to understand.  OBJECTIVE IMPAIRMENTS: Abnormal gait, cardiopulmonary status limiting activity, decreased activity tolerance, decreased balance, decreased endurance, decreased knowledge of use of DME, decreased mobility, difficulty walking, decreased strength, increased edema, postural dysfunction, prosthetic dependency , and pain.   ACTIVITY LIMITATIONS: carrying, lifting, sitting, standing, squatting, stairs, transfers, and locomotion level  PARTICIPATION LIMITATIONS: meal prep, driving, shopping, and community activity  PERSONAL FACTORS: Fitness, Time since onset of injury/illness/exacerbation, and 3+ comorbidities: see PMH  are also affecting patient's functional outcome.   REHAB POTENTIAL: Good  CLINICAL DECISION MAKING: Evolving/moderate complexity  EVALUATION COMPLEXITY: Moderate   GOALS: Goals reviewed with patient? Yes  SHORT TERM GOALS: Target date: 06/11/2022  Patient donnes prosthesis correctly modified independent & verbalizes proper use of compression shrinker. Baseline: SEE OBJECTIVE DATA Goal status: Ongoing 05/18/2022 2.  Patient tolerates prosthesis >12 hrs total /day without skin issues or limb pain <3/10 after  standing. Baseline: SEE OBJECTIVE DATA Goal status:  Ongoing 05/18/2022  3. Timed Up & Go with cane <13.5 sec Baseline: SEE OBJECTIVE DATA Goal status:  Ongoing 05/18/2022  4. Patient ambulates 150' with cane & prosthesis with supervision. Baseline: SEE OBJECTIVE DATA Goal status:  Ongoing 05/18/2022  5. Patient demo & verbalizes understanding of initial HEP Baseline: SEE OBJECTIVE DATA Goal status:  Ongoing 05/18/2022  LONG TERM GOALS: Target date: 08/06/2022  Patient demonstrates & verbalized understanding of prosthetic care to enable safe utilization of prosthesis. Baseline: SEE OBJECTIVE DATA Goal status:  Ongoing 05/18/2022  Patient tolerates prosthesis wear >90% of awake hours without skin or limb pain issues. Baseline: SEE OBJECTIVE DATA Goal status: Ongoing 05/18/2022  Merrilee Jansky Balance >46/56 Baseline: SEE OBJECTIVE DATA Goal status:  Ongoing 05/18/2022  Patient ambulates >500' with prosthesis and cane or less independently Baseline: SEE OBJECTIVE DATA Goal status: Ongoing 05/18/2022  Patient negotiates ramps, curbs & stairs with single rail with prosthesis and cane or less independently. Baseline: SEE OBJECTIVE DATA Goal status:  Ongoing 05/18/2022  Patient verbalizes & demonstrates understanding of how to properly use fitness equipment to return to gym. Baseline: SEE OBJECTIVE DATA Goal status:  Ongoing 05/18/2022  PLAN:  PT FREQUENCY: 2x/week  PT DURATION: 12 weeks  PLANNED INTERVENTIONS: Therapeutic exercises, Therapeutic activity, Neuromuscular re-education, Balance training, Gait training, Patient/Family education, Self Care, Stair training, Vestibular training, Canalith repositioning, Prosthetic training, DME instructions, and physical performance testing  PLAN FOR NEXT SESSION: instruct in adjusting ply socks,  balance activities.    Jamey Reas, PT, DPT 05/19/2022, 3:05 PM

## 2022-05-26 ENCOUNTER — Encounter: Payer: Medicare Other | Admitting: Physical Therapy

## 2022-05-28 ENCOUNTER — Ambulatory Visit (INDEPENDENT_AMBULATORY_CARE_PROVIDER_SITE_OTHER): Payer: Medicare Other | Admitting: Physical Therapy

## 2022-05-28 ENCOUNTER — Encounter: Payer: Self-pay | Admitting: Physical Therapy

## 2022-05-28 DIAGNOSIS — Z7409 Other reduced mobility: Secondary | ICD-10-CM | POA: Diagnosis not present

## 2022-05-28 DIAGNOSIS — R2681 Unsteadiness on feet: Secondary | ICD-10-CM

## 2022-05-28 DIAGNOSIS — M6281 Muscle weakness (generalized): Secondary | ICD-10-CM | POA: Diagnosis not present

## 2022-05-28 DIAGNOSIS — R293 Abnormal posture: Secondary | ICD-10-CM

## 2022-05-28 DIAGNOSIS — R2689 Other abnormalities of gait and mobility: Secondary | ICD-10-CM

## 2022-05-28 NOTE — Therapy (Signed)
OUTPATIENT PHYSICAL THERAPY TREATMENT   Patient Name: Shelley [name] MRN: [id number] DOB:July 07, 1950, 72 y.o., female Today's Date: 05/28/2022  PCP: Kathyrn Drown, MD REFERRING PROVIDER: Kathyrn Drown, MD  END OF SESSION:  PT End of Session - 05/28/22 1300     Visit Number 4    Number of Visits 25    Date for PT Re-Evaluation 08/06/22    Authorization Type Medicare & BCBS    Progress Note Due on Visit 10    PT Start Time 1300    PT Stop Time [id number]    PT Time Calculation (min) 43 min             Past Medical History:  Diagnosis Date   Diabetes ([place])    Diabetes mellitus without complication ([place])    End stage renal disease ([place])    HTN (hypertension)    Hyperlipidemia    Hypertension    Past Surgical History:  Procedure Laterality Date   ABDOMINAL HYSTERECTOMY     fibroids/complete hysterec 2000   APPENDECTOMY     CATARACT EXTRACTION Bilateral    COLONOSCOPY N/A 03/12/2016   Procedure: COLONOSCOPY;  Surgeon: Daneil Dolin, MD;  [facility];  Service: Endoscopy;  Laterality: N/A;  9:30 am   POLYPECTOMY  03/12/2016   Procedure: POLYPECTOMY;  Surgeon: Daneil Dolin, MD;  Location: [facility];  Service: Endoscopy;;  colon   YAG LASER APPLICATION Left 0000000   Procedure: YAG LASER APPLICATION;  Surgeon: Williams Che, MD;  Location: [facility];  Service: Ophthalmology;  Laterality: Left;   Patient Active Problem List   Diagnosis Date Noted   Immunosuppressed status ([place]) 04/18/2022   Hx of kidney transplant 10/14/2021   Hx of AKA (above knee amputation), left ([place]) 10/14/2021   Diabetic peripheral neuropathy associated with type 2 diabetes mellitus ([place]) 10/14/2021   Uncontrolled type 2 diabetes mellitus with hyperglycemia, with long-term current use of insulin ([place]) 08/27/2017   Hyperlipidemia associated with type 2 diabetes mellitus ([place]) 03/18/2017   Chronic renal disease, stage V ([place]) 08/20/2015   Personal history of noncompliance with  medical treatment, presenting hazards to health 08/20/2015   Proteinuria 07/04/2014   Uncontrolled type 2 diabetes mellitus with ESRD (end-stage renal disease) 05/21/2014   Hyperlipidemia 05/21/2014   HTN (hypertension) 05/21/2014    ONSET DATE: 04/17/2022 MD referral to PT  REFERRING DIAG:  89.612 (ICD-10-CM) - Hx of AKA (above knee amputation), left (HCC)  R53.1 (ICD-10-CM) - Weakness    THERAPY DIAG:  Muscle weakness (generalized)  Other abnormalities of gait and mobility  Decreased functional mobility and endurance  Unsteadiness on feet  Abnormal posture  Rationale for Evaluation and Treatment: Rehabilitation  SUBJECTIVE:   SUBJECTIVE STATEMENT: She started new med that has made her sick.    PERTINENT HISTORY: DM2, neuropathy, HTN, CHF, renal CA,CKD st 5,  ESRD, kidney transplant 08/30/2017, Thoracotomy with lobectomy 02/24/2018,   PAIN:  Are you having pain? Yes: NPRS scale: today 0/10 and over last week up to 4/10 Pain location: left residual limb posteriorly Pain description: spasm, "toothache" Aggravating factors:  prosthesis wear Relieving factors: remove prosthesis including liner within 5-10 min.   PRECAUTIONS: Other: no BP LUE  WEIGHT BEARING RESTRICTIONS: No  FALLS: Has patient fallen in last 6 months? Yes. Number of falls 4 denies injuries, shakes but not low blood sugar.   LIVING ENVIRONMENT: Lives with: lives with their family sister just moved into pt's home Lives in: [place]  single  level Home Access: Stairs to enter Home layout: One level Stairs: Yes: External: 4 steps; on left going up Has following equipment at home: Single point cane, Walker - 2 wheeled, Environmental consultant - 4 wheeled, Wheelchair (manual), [employer], and Grab bars  PLOF: Independent, Independent with household mobility without device, and Independent with community mobility without device prosthesis only for community activities  PATIENT GOALS:   walk in community. Be active without  fear of falling.   OBJECTIVE:  COGNITION: Overall cognitive status: Within functional limits for tasks assessed   SENSATION: WFL  POSTURE: rounded shoulders, forward head, flexed trunk , and weight shift right  LOWER EXTREMITY ROM:  ROM P:passive  A:active Right eval Left eval  Hip flexion    Hip extension    Hip abduction    Hip adduction    Hip internal rotation    Hip external rotation    Knee flexion  WFL  Knee extension  WFL  Ankle dorsiflexion    Ankle plantarflexion    Ankle inversion    Ankle eversion     (Blank rows = not tested)  LOWER EXTREMITY MMT:  MMT Right eval Left eval  Hip flexion 5/5 4/5  Hip extension 4/5 4-/5  Hip abduction 4/5 4-/5  Hip adduction    Hip internal rotation    Hip external rotation    Knee flexion 5/5 4/5  Knee extension 5/5 4/5  Ankle dorsiflexion    Ankle plantarflexion    Ankle inversion    Ankle eversion    (Blank rows = not tested)  TRANSFERS: Sit to stand: Modified independence requires UE assist on armrests Stand to sit: Modified independence requires UE assist on armrests  GAIT: Gait pattern: step through pattern, decreased arm swing- Left, decreased step length- Right, decreased stance time- Left, decreased hip/knee flexion- Left, Left hip hike, antalgic, and abducted- Left Distance walked: 120' Assistive device utilized: arrived using rollator walker;  assessed with cane & no device. Level of assistance: cane CGA and no device Min A Gait velocity: 2.51 ft/sec with cane &  2.84 ft/sec no device (MinA)  FUNCTIONAL TESTs:  Timed up and go (TUG):  standard 14.40sec & Cognitive 18.69sec   Both with cane. Berg Balance Scale: 38/56    CARDIOVASCULAR RESPONSE: Functional activity: Berg & Gait assessment Pre-activity vitals: HR: 76 SpO2: 94% Post-activity vitals: HR: 84 SpO2: 95%   CURRENT PROSTHETIC WEAR ASSESSMENT:  Patient is independent with: skin check, residual limb care, care of non-amputated  limb, prosthetic cleaning, correct ply sock adjustment, and proper wear schedule/adjustment Patient is dependent with: donning & managing edema / limb volume Donning prosthesis: SBA Doffing prosthesis: Modified independence Prosthetic wear tolerance: 10-12 hours of 15-16 awake hours (62-80% of awake hours) limited by pain Prosthetic weight bearing tolerance: 5 minutes limited by fatigue Edema: pitting Residual limb condition: no open areas, no hair growth, normal color, temperature & moisture, cylindrical shape Prosthetic description: silicon gel liner, suction pin sleeve with flexible inner socket supspension, total contact socket, dynamic response foot K code/activity level with prosthetic use: Level 3    TODAY'S TREATMENT:  DATE:  05/28/2022: Prosthetic Training:  PT demo & verbal cues adjusting ply socks with limb volume changes. PT had patient don her prosthesis, walk focusing on pressure/height and how deep her kneecap was seated into the socket after weightbearing with too few, too many and correct ply fit.  Patient verbalizes better understanding of adjusting ply socks. Patient continues to report posterior knee and limb pain greater at night.  When she removes the compression sock it relieves the pain.  PT suspects that her current trigger may be 1 size too small..  PT contacted prosthetist who will issue to shrinker socks for night that are 1 size bigger. Patient ambulated 100 feet x 3 with cane with supervision.  Neuromuscular reeducation: Standing balance with alternate lower extremity kicks forward/flexion, abduction & extension.  Patient performed 10 reps of each motion alternating lower extremities with intermittent loss of balance requiring her to catch herself with the sink.  Patient verbalizes understanding of exercise as HEP for  balance   05/19/2022: Therapeutic Exercise: Nustep seat 10 level 5 with BLEs / BUEs 8 min.  Neuromuscular Re-education: Sit to / from stand from 24" bar stool without UE assist 10 reps. PT demo & verbal cues on technique. Tactile, verbal & mirror for equal weight bearing LEs with pelvic weight shift.  Standing crossways on foam beam: eyes open head turns 4 directions with minA;  static eyes closed 10 sec 3 reps.  Prosthetic Training:  PT demo & verbal cues on rollator walker use & safety.  She had assembled her rollator with casters in back.  PT switched wheels to correct location. Pt amb >200', neg ramp & curb and sat on rollator seat.  PT instructed in donning sock with new suction sleeve suspension.  PT began verbal instruction in adjusting ply socks.    05/18/2022: Therapeutic Activities: PT instructed in walking program to increase activity tolerance. See HEP below. Pt verbalized understanding. Sitting on 24" bar stool with feet planted on floor building tolerance to upright trunk. Pt verbalized & return demo sit to/from stand.  PT instructed in HEP with verbal, demo & HO cues. See HEP below. Pt verbalized & return demo understanding.   Prosthetic Training with TTA prosthesis:  PT reviewed donning recommendations which pt reports using since PT eval.  She c/o burning over knee cap when sitting. PT instructed friction burn from liner. Recommended applying baby oil lightly over area.   She also reports interface gel liner hurts at upper edge & leaves indention in skin.  It has caused "welts" to area.  PT demo & verbal cues on using cut off sock from proximal to knee to 1" above interface liner upper border. Pt verbalized understanding.    PATIENT EDUCATION: PATIENT EDUCATED ON FOLLOWING PROSTHETIC CARE: Education details: donning to seat limb in socket & minimize pistoning and use of compression sock overnight / when out of prosthesis to maintain limb volume.   Person educated:  Patient Education method: Explanation, Demonstration, Tactile cues, and Verbal cues Education comprehension: verbalized understanding, returned demonstration, verbal cues required, tactile cues required, and needs further education  HOME EXERCISE PROGRAM: Access Code: [email or web address] URL: [email or web address] Date: 05/18/2022 Prepared by: Jamey Reas  Exercises - Tandem Walking with Counter Support  - 1 x daily - 5-7 x weekly - 1 sets - 3-5 reps - Backward Walking with Counter Support  - 1 x daily - 5-7 x weekly - 1 sets - 3-5 reps - [place] with Counter Support  - 1 x daily -  5-7 x weekly - 1 sets - 3-5 reps - Standing Tandem Balance with Counter Support  - 1 x daily - 5-7 x weekly - 1 sets - 1 reps - 50 seconds hold - Standing Rolling Forward & Back, Side to Side and Circles  - 1 x daily - 5-7 x weekly - 1 sets - 10 reps  Increasing your activity level is important.  Short distances which is walking from one room to another. Work to increase frequency back to prior level.  Long distance is your highest tolerance for you. Walk until you feel you must rest. Back or leg pain or general fatigue are indicators to maximum tolerance. Monitor by distance or time. Try to walk your BEST distance 1 times per day. You should see this increase over time.   Medium distances are entering & exiting your home or community with limited distances. Start with 4 medium walks which is one outing to one location and increase number of tolerated amounts per day.  ASSESSMENT: CLINICAL IMPRESSION: Patient appears to have a better understanding of how to adjust ply socks with limb volume changes.  She appears to understand alternate kicks for balance which needs to be performed near the sink for safety.  She appears to understand.  OBJECTIVE IMPAIRMENTS: Abnormal gait, cardiopulmonary status limiting activity, decreased activity tolerance, decreased balance, decreased endurance, decreased knowledge of  use of DME, decreased mobility, difficulty walking, decreased strength, increased edema, postural dysfunction, prosthetic dependency , and pain.   ACTIVITY LIMITATIONS: carrying, lifting, sitting, standing, squatting, stairs, transfers, and locomotion level  PARTICIPATION LIMITATIONS: meal prep, driving, shopping, and community activity  PERSONAL FACTORS: Fitness, Time since onset of injury/illness/exacerbation, and 3+ comorbidities: see PMH  are also affecting patient's functional outcome.   REHAB POTENTIAL: Good  CLINICAL DECISION MAKING: Evolving/moderate complexity  EVALUATION COMPLEXITY: Moderate   GOALS: Goals reviewed with patient? Yes  SHORT TERM GOALS: Target date: 06/11/2022  Patient donnes prosthesis correctly modified independent & verbalizes proper use of compression shrinker. Baseline: SEE OBJECTIVE DATA Goal status: Ongoing 05/18/2022 2.  Patient tolerates prosthesis >12 hrs total /day without skin issues or limb pain <3/10 after standing. Baseline: SEE OBJECTIVE DATA Goal status:  Ongoing 05/18/2022  3. Timed Up & Go with cane <13.5 sec Baseline: SEE OBJECTIVE DATA Goal status:  Ongoing 05/18/2022  4. Patient ambulates 150' with cane & prosthesis with supervision. Baseline: SEE OBJECTIVE DATA Goal status:  Ongoing 05/18/2022  5. Patient demo & verbalizes understanding of initial HEP Baseline: SEE OBJECTIVE DATA Goal status:  Ongoing 05/18/2022  LONG TERM GOALS: Target date: 08/06/2022  Patient demonstrates & verbalized understanding of prosthetic care to enable safe utilization of prosthesis. Baseline: SEE OBJECTIVE DATA Goal status:  Ongoing 05/18/2022  Patient tolerates prosthesis wear >90% of awake hours without skin or limb pain issues. Baseline: SEE OBJECTIVE DATA Goal status: Ongoing 05/18/2022  Merrilee Jansky Balance >46/56 Baseline: SEE OBJECTIVE DATA Goal status:  Ongoing 05/18/2022  Patient ambulates >500' with prosthesis and cane or less  independently Baseline: SEE OBJECTIVE DATA Goal status: Ongoing 05/18/2022  Patient negotiates ramps, curbs & stairs with single rail with prosthesis and cane or less independently. Baseline: SEE OBJECTIVE DATA Goal status:  Ongoing 05/18/2022  Patient verbalizes & demonstrates understanding of how to properly use fitness equipment to return to gym. Baseline: SEE OBJECTIVE DATA Goal status:  Ongoing 05/18/2022  PLAN:  PT FREQUENCY: 2x/week  PT DURATION: 12 weeks  PLANNED INTERVENTIONS: Therapeutic exercises, Therapeutic activity, Neuromuscular re-education, Balance training, Gait training,  Patient/Family education, Self Care, Stair training, Vestibular training, Canalith repositioning, Prosthetic training, DME instructions, and physical performance testing  PLAN FOR NEXT SESSION: Check how adjusting ply socks is going,  balance activities.    Jamey Reas, PT, DPT 05/28/2022, 1:53 PM

## 2022-06-01 ENCOUNTER — Ambulatory Visit (INDEPENDENT_AMBULATORY_CARE_PROVIDER_SITE_OTHER): Payer: Medicare Other | Admitting: Physical Therapy

## 2022-06-01 ENCOUNTER — Encounter: Payer: Self-pay | Admitting: Physical Therapy

## 2022-06-01 DIAGNOSIS — Z7409 Other reduced mobility: Secondary | ICD-10-CM | POA: Diagnosis not present

## 2022-06-01 DIAGNOSIS — R2689 Other abnormalities of gait and mobility: Secondary | ICD-10-CM

## 2022-06-01 DIAGNOSIS — R2681 Unsteadiness on feet: Secondary | ICD-10-CM

## 2022-06-01 DIAGNOSIS — R293 Abnormal posture: Secondary | ICD-10-CM

## 2022-06-01 DIAGNOSIS — M6281 Muscle weakness (generalized): Secondary | ICD-10-CM | POA: Diagnosis not present

## 2022-06-01 NOTE — Therapy (Signed)
OUTPATIENT PHYSICAL THERAPY TREATMENT   Patient Name: Shelley [name] MRN: [id number] DOB:1950/03/29, 72 y.o., female Today's Date: 06/01/2022  PCP: Kathyrn Drown, MD REFERRING PROVIDER: Kathyrn Drown, MD  END OF SESSION:  PT End of Session - 06/01/22 1425     Visit Number 5    Number of Visits 25    Date for PT Re-Evaluation 08/06/22    Authorization Type Medicare & BCBS    Progress Note Due on Visit 10    PT Start Time [id number]    PT Stop Time 1512    PT Time Calculation (min) 47 min    Equipment Utilized During Treatment Gait belt    Activity Tolerance Patient tolerated treatment well;Patient limited by fatigue    Behavior During Therapy WFL for tasks assessed/performed             Past Medical History:  Diagnosis Date   Diabetes ([place])    Diabetes mellitus without complication ([place])    End stage renal disease ([place])    HTN (hypertension)    Hyperlipidemia    Hypertension    Past Surgical History:  Procedure Laterality Date   ABDOMINAL HYSTERECTOMY     fibroids/complete hysterec 2000   APPENDECTOMY     CATARACT EXTRACTION Bilateral    COLONOSCOPY N/A 03/12/2016   Procedure: COLONOSCOPY;  Surgeon: Daneil Dolin, MD;  [facility];  Service: Endoscopy;  Laterality: N/A;  9:30 am   POLYPECTOMY  03/12/2016   Procedure: POLYPECTOMY;  Surgeon: Daneil Dolin, MD;  Location: [facility];  Service: Endoscopy;;  colon   YAG LASER APPLICATION Left 0000000   Procedure: YAG LASER APPLICATION;  Surgeon: Williams Che, MD;  Location: [facility];  Service: Ophthalmology;  Laterality: Left;   Patient Active Problem List   Diagnosis Date Noted   Immunosuppressed status ([place]) 04/18/2022   Hx of kidney transplant 10/14/2021   Hx of AKA (above knee amputation), left ([place]) 10/14/2021   Diabetic peripheral neuropathy associated with type 2 diabetes mellitus ([place]) 10/14/2021   Uncontrolled type 2 diabetes mellitus with hyperglycemia, with long-term current use of  insulin ([place]) 08/27/2017   Hyperlipidemia associated with type 2 diabetes mellitus ([place]) 03/18/2017   Chronic renal disease, stage V ([place]) 08/20/2015   Personal history of noncompliance with medical treatment, presenting hazards to health 08/20/2015   Proteinuria 07/04/2014   Uncontrolled type 2 diabetes mellitus with ESRD (end-stage renal disease) 05/21/2014   Hyperlipidemia 05/21/2014   HTN (hypertension) 05/21/2014    ONSET DATE: 04/17/2022 MD referral to PT  REFERRING DIAG:  89.612 (ICD-10-CM) - Hx of AKA (above knee amputation), left (HCC)  R53.1 (ICD-10-CM) - Weakness    THERAPY DIAG:  Muscle weakness (generalized)  Other abnormalities of gait and mobility  Decreased functional mobility and endurance  Unsteadiness on feet  Abnormal posture  Rationale for Evaluation and Treatment: Rehabilitation  SUBJECTIVE:   SUBJECTIVE STATEMENT: She had cramp in her left TTA limb on way to PT and removed prosthesis which got rid of pain.  She had to redonne the prosthesis when got to PT clinic.  She is going by [employer] to pick up new shrinker socks on way to PT.  She adjusted ply socks as PT recommended and it helped.   PERTINENT HISTORY: DM2, neuropathy, HTN, CHF, renal CA,CKD st 5,  ESRD, kidney transplant 08/30/2017, Thoracotomy with lobectomy 02/24/2018,   PAIN:  Are you having pain? Yes: NPRS scale: today 1/10 and over last week up to  9/10 Pain location: left residual limb posteriorly Pain description: spasm, "toothache" Aggravating factors:  prosthesis wear Relieving factors: remove prosthesis including liner within 5-10 min.   PRECAUTIONS: Other: no BP LUE  WEIGHT BEARING RESTRICTIONS: No  FALLS: Has patient fallen in last 6 months? Yes. Number of falls 4 denies injuries, shakes but not low blood sugar.   LIVING ENVIRONMENT: Lives with: lives with their family sister just moved into pt's home Lives in: House  single level Home Access: Stairs to enter Home layout: One  level Stairs: Yes: External: 4 steps; on left going up Has following equipment at home: Single point cane, Environmental consultant - 2 wheeled, Environmental consultant - 4 wheeled, Wheelchair (manual), [employer], and Grab bars  PLOF: Independent, Independent with household mobility without device, and Independent with community mobility without device prosthesis only for community activities  PATIENT GOALS:   walk in community. Be active without fear of falling.   OBJECTIVE:  COGNITION: Overall cognitive status: Within functional limits for tasks assessed   SENSATION: WFL  POSTURE: rounded shoulders, forward head, flexed trunk , and weight shift right  LOWER EXTREMITY ROM:  ROM P:passive  A:active Right eval Left eval  Hip flexion    Hip extension    Hip abduction    Hip adduction    Hip internal rotation    Hip external rotation    Knee flexion  WFL  Knee extension  WFL  Ankle dorsiflexion    Ankle plantarflexion    Ankle inversion    Ankle eversion     (Blank rows = not tested)  LOWER EXTREMITY MMT:  MMT Right eval Left eval  Hip flexion 5/5 4/5  Hip extension 4/5 4-/5  Hip abduction 4/5 4-/5  Hip adduction    Hip internal rotation    Hip external rotation    Knee flexion 5/5 4/5  Knee extension 5/5 4/5  Ankle dorsiflexion    Ankle plantarflexion    Ankle inversion    Ankle eversion    (Blank rows = not tested)  TRANSFERS: Sit to stand: Modified independence requires UE assist on armrests Stand to sit: Modified independence requires UE assist on armrests  GAIT: Gait pattern: step through pattern, decreased arm swing- Left, decreased step length- Right, decreased stance time- Left, decreased hip/knee flexion- Left, Left hip hike, antalgic, and abducted- Left Distance walked: 120' Assistive device utilized: arrived using rollator walker;  assessed with cane & no device. Level of assistance: cane CGA and no device Min A Gait velocity: 2.51 ft/sec with cane &  2.84 ft/sec no device  (MinA)  FUNCTIONAL TESTs:  Timed up and go (TUG):  standard 14.40sec & Cognitive 18.69sec   Both with cane. Berg Balance Scale: 38/56    CARDIOVASCULAR RESPONSE: Functional activity: Berg & Gait assessment Pre-activity vitals: HR: 76 SpO2: 94% Post-activity vitals: HR: 84 SpO2: 95%   CURRENT PROSTHETIC WEAR ASSESSMENT:  Patient is independent with: skin check, residual limb care, care of non-amputated limb, prosthetic cleaning, correct ply sock adjustment, and proper wear schedule/adjustment Patient is dependent with: donning & managing edema / limb volume Donning prosthesis: SBA Doffing prosthesis: Modified independence Prosthetic wear tolerance: 10-12 hours of 15-16 awake hours (62-80% of awake hours) limited by pain Prosthetic weight bearing tolerance: 5 minutes limited by fatigue Edema: pitting Residual limb condition: no open areas, no hair growth, normal color, temperature & moisture, cylindrical shape Prosthetic description: silicon gel liner, suction pin sleeve with flexible inner socket supspension, total contact socket, dynamic response foot K  code/activity level with prosthetic use: Level 3    TODAY'S TREATMENT:                                                                                                                             DATE:  06/01/2022: Prosthetic Training with Left TTA prosthesis:  PT recommending weighing at least every other morning with prosthesis to monitor fluid retention. Pt verbalized understanding. Pt fatigued quickly with some ShOB with 3-5 min standing activities. Pt arrived & exited with rollator walker and amb in clinic with [facility] with CGA.  PT demo & verbal cues on getting in / out of car which is higher with running board.  Pt required minA even with using BUEs on grab bars of car.  PT demo / verbal cues on sitting at edge with seat all the way back & reclined, scoot pelvis back to middle of vehicle when turning feet in or out. Pt demo &  verbalized understanding.     Neuromuscular Re-education for balance: Rocker board with PT demo & verbal cues on technique.  BUE support with goal to decrease wt bearing, mirror for visual feedback / upright trunk.  Ant / level / post and right / level / left 1 min ea with 1st set on square board with 2 pivots and progressed 2nd set to round board single pivot.   Standing on foam pad alternate LE kicks 10 reps ea LE - flex, abd & ext with intermittent UE touch more with prosthetic stance. BUE support hovering / touch intermittently.  4-square stepping CW / CCW with cane 2 reps ea with minA for balance.  Pt improved balance slightly with 2nd rep.     05/28/2022: Prosthetic Training:  PT demo & verbal cues adjusting ply socks with limb volume changes. PT had patient don her prosthesis, walk focusing on pressure/height and how deep her kneecap was seated into the socket after weightbearing with too few, too many and correct ply fit.  Patient verbalizes better understanding of adjusting ply socks. Patient continues to report posterior knee and limb pain greater at night.  When she removes the compression sock it relieves the pain.  PT suspects that her current trigger may be 1 size too small..  PT contacted prosthetist who will issue to shrinker socks for night that are 1 size bigger. Patient ambulated 100 feet x 3 with cane with supervision.  Neuromuscular reeducation: Standing balance with alternate lower extremity kicks forward/flexion, abduction & extension.  Patient performed 10 reps of each motion alternating lower extremities with intermittent loss of balance requiring her to catch herself with the sink.  Patient verbalizes understanding of exercise as HEP for balance   05/19/2022: Therapeutic Exercise: Nustep seat 10 level 5 with BLEs / BUEs 8 min.  Neuromuscular Re-education: Sit to / from stand from 24" bar stool without UE assist 10 reps. PT demo & verbal cues on technique. Tactile,  verbal & mirror for equal weight bearing  LEs with pelvic weight shift.  Standing crossways on foam beam: eyes open head turns 4 directions with minA;  static eyes closed 10 sec 3 reps.  Prosthetic Training:  PT demo & verbal cues on rollator walker use & safety.  She had assembled her rollator with casters in back.  PT switched wheels to correct location. Pt amb >200', neg ramp & curb and sat on rollator seat.  PT instructed in donning sock with new suction sleeve suspension.  PT began verbal instruction in adjusting ply socks.    PATIENT EDUCATION: PATIENT EDUCATED ON FOLLOWING PROSTHETIC CARE: Education details: donning to seat limb in socket & minimize pistoning and use of compression sock overnight / when out of prosthesis to maintain limb volume.   Person educated: Patient Education method: Explanation, Demonstration, Tactile cues, and Verbal cues Education comprehension: verbalized understanding, returned demonstration, verbal cues required, tactile cues required, and needs further education  HOME EXERCISE PROGRAM: Access Code: [email or web address] URL: [email or web address] Date: 05/18/2022 Prepared by: Jamey Reas  Exercises - Tandem Walking with Counter Support  - 1 x daily - 5-7 x weekly - 1 sets - 3-5 reps - Backward Walking with Counter Support  - 1 x daily - 5-7 x weekly - 1 sets - 3-5 reps - [place] with Counter Support  - 1 x daily - 5-7 x weekly - 1 sets - 3-5 reps - Standing Tandem Balance with Counter Support  - 1 x daily - 5-7 x weekly - 1 sets - 1 reps - 50 seconds hold - Standing Rolling Forward & Back, Side to Side and Circles  - 1 x daily - 5-7 x weekly - 1 sets - 10 reps  Increasing your activity level is important.  Short distances which is walking from one room to another. Work to increase frequency back to prior level.  Long distance is your highest tolerance for you. Walk until you feel you must rest. Back or leg pain or general fatigue are indicators to  maximum tolerance. Monitor by distance or time. Try to walk your BEST distance 1 times per day. You should see this increase over time.   Medium distances are entering & exiting your home or community with limited distances. Start with 4 medium walks which is one outing to one location and increase number of tolerated amounts per day.  ASSESSMENT: CLINICAL IMPRESSION: Patient had increased ShOB and limb cramps with activities today.  PT recommending morning weighs as issues can be related to fluid retention with her history. Pt seems to understand recommendation.  She improved understanding how to get in /out of vehicle.  She appears to understand.  OBJECTIVE IMPAIRMENTS: Abnormal gait, cardiopulmonary status limiting activity, decreased activity tolerance, decreased balance, decreased endurance, decreased knowledge of use of DME, decreased mobility, difficulty walking, decreased strength, increased edema, postural dysfunction, prosthetic dependency , and pain.   ACTIVITY LIMITATIONS: carrying, lifting, sitting, standing, squatting, stairs, transfers, and locomotion level  PARTICIPATION LIMITATIONS: meal prep, driving, shopping, and community activity  PERSONAL FACTORS: Fitness, Time since onset of injury/illness/exacerbation, and 3+ comorbidities: see PMH  are also affecting patient's functional outcome.   REHAB POTENTIAL: Good  CLINICAL DECISION MAKING: Evolving/moderate complexity  EVALUATION COMPLEXITY: Moderate   GOALS: Goals reviewed with patient? Yes  SHORT TERM GOALS: Target date: 06/11/2022  Patient donnes prosthesis correctly modified independent & verbalizes proper use of compression shrinker. Baseline: SEE OBJECTIVE DATA Goal status: Ongoing 05/18/2022 2.  Patient tolerates prosthesis >12 hrs total /day without skin issues or limb  pain <3/10 after standing. Baseline: SEE OBJECTIVE DATA Goal status:  Ongoing 05/18/2022  3. Timed Up & Go with cane <13.5 sec Baseline: SEE  OBJECTIVE DATA Goal status:  Ongoing 05/18/2022  4. Patient ambulates 150' with cane & prosthesis with supervision. Baseline: SEE OBJECTIVE DATA Goal status:  Ongoing 05/18/2022  5. Patient demo & verbalizes understanding of initial HEP Baseline: SEE OBJECTIVE DATA Goal status:  Ongoing 05/18/2022  LONG TERM GOALS: Target date: 08/06/2022  Patient demonstrates & verbalized understanding of prosthetic care to enable safe utilization of prosthesis. Baseline: SEE OBJECTIVE DATA Goal status:  Ongoing 05/18/2022  Patient tolerates prosthesis wear >90% of awake hours without skin or limb pain issues. Baseline: SEE OBJECTIVE DATA Goal status: Ongoing 05/18/2022  Merrilee Jansky Balance >46/56 Baseline: SEE OBJECTIVE DATA Goal status:  Ongoing 05/18/2022  Patient ambulates >500' with prosthesis and cane or less independently Baseline: SEE OBJECTIVE DATA Goal status: Ongoing 05/18/2022  Patient negotiates ramps, curbs & stairs with single rail with prosthesis and cane or less independently. Baseline: SEE OBJECTIVE DATA Goal status:  Ongoing 05/18/2022  Patient verbalizes & demonstrates understanding of how to properly use fitness equipment to return to gym. Baseline: SEE OBJECTIVE DATA Goal status:  Ongoing 05/18/2022  PLAN:  PT FREQUENCY: 2x/week  PT DURATION: 12 weeks  PLANNED INTERVENTIONS: Therapeutic exercises, Therapeutic activity, Neuromuscular re-education, Balance training, Gait training, Patient/Family education, Self Care, Stair training, Vestibular training, Canalith repositioning, Prosthetic training, DME instructions, and physical performance testing  PLAN FOR NEXT SESSION: check if she is weighing in mornings,  instruct on donning shrinkers, continue balance activities.    Jamey Reas, PT, DPT 06/01/2022, 4:23 PM

## 2022-06-03 ENCOUNTER — Encounter: Payer: Self-pay | Admitting: Physical Therapy

## 2022-06-03 ENCOUNTER — Ambulatory Visit (INDEPENDENT_AMBULATORY_CARE_PROVIDER_SITE_OTHER): Payer: Medicare Other | Admitting: Physical Therapy

## 2022-06-03 DIAGNOSIS — Z7409 Other reduced mobility: Secondary | ICD-10-CM | POA: Diagnosis not present

## 2022-06-03 DIAGNOSIS — R2681 Unsteadiness on feet: Secondary | ICD-10-CM

## 2022-06-03 DIAGNOSIS — M6281 Muscle weakness (generalized): Secondary | ICD-10-CM

## 2022-06-03 DIAGNOSIS — R293 Abnormal posture: Secondary | ICD-10-CM

## 2022-06-03 DIAGNOSIS — R2689 Other abnormalities of gait and mobility: Secondary | ICD-10-CM | POA: Diagnosis not present

## 2022-06-03 NOTE — Therapy (Signed)
OUTPATIENT PHYSICAL THERAPY TREATMENT   Patient Name: Shelley [name] MRN: [id number] DOB:08-07-1950, 72 y.o., female Today's Date: 06/03/2022  PCP: Kathyrn Drown, MD REFERRING PROVIDER: Kathyrn Drown, MD  END OF SESSION:  PT End of Session - 06/03/22 1345     Visit Number 6    Number of Visits 25    Date for PT Re-Evaluation 08/06/22    Authorization Type Medicare & BCBS    Progress Note Due on Visit 10    PT Start Time [id number]    PT Stop Time 1428    PT Time Calculation (min) 43 min    Equipment Utilized During Treatment Gait belt    Activity Tolerance Patient tolerated treatment well;Patient limited by fatigue    Behavior During Therapy WFL for tasks assessed/performed             Past Medical History:  Diagnosis Date   Diabetes ([place])    Diabetes mellitus without complication ([place])    End stage renal disease ([place])    HTN (hypertension)    Hyperlipidemia    Hypertension    Past Surgical History:  Procedure Laterality Date   ABDOMINAL HYSTERECTOMY     fibroids/complete hysterec 2000   APPENDECTOMY     CATARACT EXTRACTION Bilateral    COLONOSCOPY N/A 03/12/2016   Procedure: COLONOSCOPY;  Surgeon: Daneil Dolin, MD;  [facility];  Service: Endoscopy;  Laterality: N/A;  9:30 am   POLYPECTOMY  03/12/2016   Procedure: POLYPECTOMY;  Surgeon: Daneil Dolin, MD;  Location: [facility];  Service: Endoscopy;;  colon   YAG LASER APPLICATION Left 0000000   Procedure: YAG LASER APPLICATION;  Surgeon: Williams Che, MD;  Location: [facility];  Service: Ophthalmology;  Laterality: Left;   Patient Active Problem List   Diagnosis Date Noted   Immunosuppressed status ([place]) 04/18/2022   Hx of kidney transplant 10/14/2021   Hx of AKA (above knee amputation), left ([place]) 10/14/2021   Diabetic peripheral neuropathy associated with type 2 diabetes mellitus ([place]) 10/14/2021   Uncontrolled type 2 diabetes mellitus with hyperglycemia, with long-term current use of  insulin ([place]) 08/27/2017   Hyperlipidemia associated with type 2 diabetes mellitus ([place]) 03/18/2017   Chronic renal disease, stage V ([place]) 08/20/2015   Personal history of noncompliance with medical treatment, presenting hazards to health 08/20/2015   Proteinuria 07/04/2014   Uncontrolled type 2 diabetes mellitus with ESRD (end-stage renal disease) 05/21/2014   Hyperlipidemia 05/21/2014   HTN (hypertension) 05/21/2014    ONSET DATE: 04/17/2022 MD referral to PT  REFERRING DIAG:  89.612 (ICD-10-CM) - Hx of AKA (above knee amputation), left (HCC)  R53.1 (ICD-10-CM) - Weakness    THERAPY DIAG:  Muscle weakness (generalized)  Other abnormalities of gait and mobility  Decreased functional mobility and endurance  Unsteadiness on feet  Abnormal posture  Rationale for Evaluation and Treatment: Rehabilitation  SUBJECTIVE:   SUBJECTIVE STATEMENT: She had echocardiogram & saw cardiologist who said heart doing okay.  She weighed with prosthesis in morning at 204# and will weigh watching for weight changes / fluid retention.    PERTINENT HISTORY: DM2, neuropathy, HTN, CHF, renal CA,CKD st 5,  ESRD, kidney transplant 08/30/2017, Thoracotomy with lobectomy 02/24/2018,   PAIN:  Are you having pain? Yes: NPRS scale: today   1/10 and over last week up to 9/10 Pain location: left residual limb posteriorly Pain description: spasm, "toothache" Aggravating factors:  prosthesis wear Relieving factors: remove prosthesis including liner within 5-10 min.  PRECAUTIONS: Other: no BP LUE  WEIGHT BEARING RESTRICTIONS: No  FALLS: Has patient fallen in last 6 months? Yes. Number of falls 4 denies injuries, shakes but not low blood sugar.   LIVING ENVIRONMENT: Lives with: lives with their family sister just moved into pt's home Lives in: House  single level Home Access: Stairs to enter Home layout: One level Stairs: Yes: External: 4 steps; on left going up Has following equipment at home: Single  point cane, Environmental consultant - 2 wheeled, Environmental consultant - 4 wheeled, Wheelchair (manual), [employer], and Grab bars  PLOF: Independent, Independent with household mobility without device, and Independent with community mobility without device prosthesis only for community activities  PATIENT GOALS:   walk in community. Be active without fear of falling.   OBJECTIVE:  COGNITION: Overall cognitive status: Within functional limits for tasks assessed   SENSATION: WFL  POSTURE: rounded shoulders, forward head, flexed trunk , and weight shift right  LOWER EXTREMITY ROM:  ROM P:passive  A:active Right eval Left eval  Hip flexion    Hip extension    Hip abduction    Hip adduction    Hip internal rotation    Hip external rotation    Knee flexion  WFL  Knee extension  WFL  Ankle dorsiflexion    Ankle plantarflexion    Ankle inversion    Ankle eversion     (Blank rows = not tested)  LOWER EXTREMITY MMT:  MMT Right eval Left eval  Hip flexion 5/5 4/5  Hip extension 4/5 4-/5  Hip abduction 4/5 4-/5  Hip adduction    Hip internal rotation    Hip external rotation    Knee flexion 5/5 4/5  Knee extension 5/5 4/5  Ankle dorsiflexion    Ankle plantarflexion    Ankle inversion    Ankle eversion    (Blank rows = not tested)  TRANSFERS: Sit to stand: Modified independence requires UE assist on armrests Stand to sit: Modified independence requires UE assist on armrests  GAIT: Gait pattern: step through pattern, decreased arm swing- Left, decreased step length- Right, decreased stance time- Left, decreased hip/knee flexion- Left, Left hip hike, antalgic, and abducted- Left Distance walked: 120' Assistive device utilized: arrived using rollator walker;  assessed with cane & no device. Level of assistance: cane CGA and no device Min A Gait velocity: 2.51 ft/sec with cane &  2.84 ft/sec no device (MinA)  FUNCTIONAL TESTs:  Timed up and go (TUG):  standard 14.40sec & Cognitive 18.69sec   Both  with cane. Berg Balance Scale: 38/56    CARDIOVASCULAR RESPONSE: Functional activity: Berg & Gait assessment Pre-activity vitals: HR: 76 SpO2: 94% Post-activity vitals: HR: 84 SpO2: 95%   CURRENT PROSTHETIC WEAR ASSESSMENT:  Patient is independent with: skin check, residual limb care, care of non-amputated limb, prosthetic cleaning, correct ply sock adjustment, and proper wear schedule/adjustment Patient is dependent with: donning & managing edema / limb volume Donning prosthesis: SBA Doffing prosthesis: Modified independence Prosthetic wear tolerance: 10-12 hours of 15-16 awake hours (62-80% of awake hours) limited by pain Prosthetic weight bearing tolerance: 5 minutes limited by fatigue Edema: pitting Residual limb condition: no open areas, no hair growth, normal color, temperature & moisture, cylindrical shape Prosthetic description: silicon gel liner, suction pin sleeve with flexible inner socket supspension, total contact socket, dynamic response foot K code/activity level with prosthetic use: Level 3    TODAY'S TREATMENT:  DATE:  06/03/2022 Prosthetic Training with Left TTA prosthesis:  Pt brought 5" width tubular shrinker that she got from prosthetist. She demo what she has been doing which pulled section above knee so high that it rolled into >1" "tourniquet"   PT demo how high above knee to pull shrinker for minimal rolling and folding back section below knee for triple layer.  Pt verbalized understanding.  Patient ambulated 100 foot x 2 with cane with supervision.  Therapeutic exercise: Step up and over Bosu round side up with two-step approach for momentum with bilateral upper extremity support 5 reps on each lower extremity.  Neuromuscular Re-education for balance: Rocker board with PT demo & verbal cues on technique.  BUE support with goal to  decrease wt bearing, mirror for visual feedback / upright trunk.  Ant / level / post and right / level / left 1 min ea with 1st set on square board with 2 pivots and progressed 2nd set to round board single pivot.  Working on decreasing UE support on //bars.  Standing on foam pad alternate LE kicks 10 reps ea LE - flex, abd & ext with intermittent UE touch more with prosthetic stance. BUE support hovering / touch intermittently.  4-square stepping CW / CCW with cane 2 reps ea with CGA for balance and 1 rep without an assistive device with min assist for balance losses.    06/01/2022: Prosthetic Training with Left TTA prosthesis:  PT recommending weighing at least every other morning with prosthesis to monitor fluid retention. Pt verbalized understanding. Pt fatigued quickly with some ShOB with 3-5 min standing activities. Pt arrived & exited with rollator walker and amb in clinic with [facility] with CGA.  PT demo & verbal cues on getting in / out of car which is higher with running board.  Pt required minA even with using BUEs on grab bars of car.  PT demo / verbal cues on sitting at edge with seat all the way back & reclined, scoot pelvis back to middle of vehicle when turning feet in or out. Pt demo & verbalized understanding.     Neuromuscular Re-education for balance: Rocker board with PT demo & verbal cues on technique.  BUE support with goal to decrease wt bearing, mirror for visual feedback / upright trunk.  Ant / level / post and right / level / left 1 min ea with 1st set on square board with 2 pivots and progressed 2nd set to round board single pivot.   Standing on foam pad alternate LE kicks 10 reps ea LE - flex, abd & ext with intermittent UE touch more with prosthetic stance. BUE support hovering / touch intermittently.  4-square stepping CW / CCW with cane 2 reps ea with minA for balance.  Pt improved balance slightly with 2nd rep.     05/28/2022: Prosthetic Training:  PT demo & verbal cues  adjusting ply socks with limb volume changes. PT had patient don her prosthesis, walk focusing on pressure/height and how deep her kneecap was seated into the socket after weightbearing with too few, too many and correct ply fit.  Patient verbalizes better understanding of adjusting ply socks. Patient continues to report posterior knee and limb pain greater at night.  When she removes the compression sock it relieves the pain.  PT suspects that her current trigger may be 1 size too small..  PT contacted prosthetist who will issue to shrinker socks for night that are 1 size bigger. Patient ambulated 100 feet  x 3 with cane with supervision.  Neuromuscular reeducation: Standing balance with alternate lower extremity kicks forward/flexion, abduction & extension.  Patient performed 10 reps of each motion alternating lower extremities with intermittent loss of balance requiring her to catch herself with the sink.  Patient verbalizes understanding of exercise as HEP for balance    HOME EXERCISE PROGRAM: Access Code: [email or web address] URL: [email or web address] Date: 05/18/2022 Prepared by: Jamey Reas  Exercises - Tandem Walking with Counter Support  - 1 x daily - 5-7 x weekly - 1 sets - 3-5 reps - Backward Walking with Counter Support  - 1 x daily - 5-7 x weekly - 1 sets - 3-5 reps - [place] with Counter Support  - 1 x daily - 5-7 x weekly - 1 sets - 3-5 reps - Standing Tandem Balance with Counter Support  - 1 x daily - 5-7 x weekly - 1 sets - 1 reps - 50 seconds hold - Standing Rolling Forward & Back, Side to Side and Circles  - 1 x daily - 5-7 x weekly - 1 sets - 10 reps  Increasing your activity level is important.  Short distances which is walking from one room to another. Work to increase frequency back to prior level.  Long distance is your highest tolerance for you. Walk until you feel you must rest. Back or leg pain or general fatigue are indicators to maximum tolerance. Monitor  by distance or time. Try to walk your BEST distance 1 times per day. You should see this increase over time.   Medium distances are entering & exiting your home or community with limited distances. Start with 4 medium walks which is one outing to one location and increase number of tolerated amounts per day.  ASSESSMENT: CLINICAL IMPRESSION: Patient had improved ability to perform 4 square stepping with cane today.  Patient's lower extremity pain that she reports may be coming from the fact that she was causing a tourniquet effect with her shrinker.  She appears to understand how to properly use the shrinker now.    OBJECTIVE IMPAIRMENTS: Abnormal gait, cardiopulmonary status limiting activity, decreased activity tolerance, decreased balance, decreased endurance, decreased knowledge of use of DME, decreased mobility, difficulty walking, decreased strength, increased edema, postural dysfunction, prosthetic dependency , and pain.   ACTIVITY LIMITATIONS: carrying, lifting, sitting, standing, squatting, stairs, transfers, and locomotion level  PARTICIPATION LIMITATIONS: meal prep, driving, shopping, and community activity  PERSONAL FACTORS: Fitness, Time since onset of injury/illness/exacerbation, and 3+ comorbidities: see PMH  are also affecting patient's functional outcome.   REHAB POTENTIAL: Good  CLINICAL DECISION MAKING: Evolving/moderate complexity  EVALUATION COMPLEXITY: Moderate   GOALS: Goals reviewed with patient? Yes  SHORT TERM GOALS: Target date: 06/11/2022  Patient donnes prosthesis correctly modified independent & verbalizes proper use of compression shrinker. Baseline: SEE OBJECTIVE DATA Goal status: Ongoing 05/18/2022 2.  Patient tolerates prosthesis >12 hrs total /day without skin issues or limb pain <3/10 after standing. Baseline: SEE OBJECTIVE DATA Goal status:  Ongoing 05/18/2022  3. Timed Up & Go with cane <13.5 sec Baseline: SEE OBJECTIVE DATA Goal status:   Ongoing 05/18/2022  4. Patient ambulates 150' with cane & prosthesis with supervision. Baseline: SEE OBJECTIVE DATA Goal status:  Ongoing 05/18/2022  5. Patient demo & verbalizes understanding of initial HEP Baseline: SEE OBJECTIVE DATA Goal status:  Ongoing 05/18/2022  LONG TERM GOALS: Target date: 08/06/2022  Patient demonstrates & verbalized understanding of prosthetic care to enable safe utilization of prosthesis. Baseline: SEE OBJECTIVE DATA Goal  status:  Ongoing 05/18/2022  Patient tolerates prosthesis wear >90% of awake hours without skin or limb pain issues. Baseline: SEE OBJECTIVE DATA Goal status: Ongoing 05/18/2022  Merrilee Jansky Balance >46/56 Baseline: SEE OBJECTIVE DATA Goal status:  Ongoing 05/18/2022  Patient ambulates >500' with prosthesis and cane or less independently Baseline: SEE OBJECTIVE DATA Goal status: Ongoing 05/18/2022  Patient negotiates ramps, curbs & stairs with single rail with prosthesis and cane or less independently. Baseline: SEE OBJECTIVE DATA Goal status:  Ongoing 05/18/2022  Patient verbalizes & demonstrates understanding of how to properly use fitness equipment to return to gym. Baseline: SEE OBJECTIVE DATA Goal status:  Ongoing 05/18/2022  PLAN:  PT FREQUENCY: 2x/week  PT DURATION: 12 weeks  PLANNED INTERVENTIONS: Therapeutic exercises, Therapeutic activity, Neuromuscular re-education, Balance training, Gait training, Patient/Family education, Self Care, Stair training, Vestibular training, Canalith repositioning, Prosthetic training, DME instructions, and physical performance testing  PLAN FOR NEXT SESSION:   check STGs, continue balance activities.    Jamey Reas, PT, DPT 06/03/2022, 4:25 PM

## 2022-06-08 ENCOUNTER — Ambulatory Visit (INDEPENDENT_AMBULATORY_CARE_PROVIDER_SITE_OTHER): Payer: Medicare Other | Admitting: Physical Therapy

## 2022-06-08 ENCOUNTER — Encounter: Payer: Self-pay | Admitting: Physical Therapy

## 2022-06-08 DIAGNOSIS — R2681 Unsteadiness on feet: Secondary | ICD-10-CM | POA: Diagnosis not present

## 2022-06-08 DIAGNOSIS — R2689 Other abnormalities of gait and mobility: Secondary | ICD-10-CM

## 2022-06-08 DIAGNOSIS — Z7409 Other reduced mobility: Secondary | ICD-10-CM | POA: Diagnosis not present

## 2022-06-08 DIAGNOSIS — M6281 Muscle weakness (generalized): Secondary | ICD-10-CM

## 2022-06-08 DIAGNOSIS — R293 Abnormal posture: Secondary | ICD-10-CM

## 2022-06-08 NOTE — Therapy (Signed)
OUTPATIENT PHYSICAL THERAPY TREATMENT   Patient Name: Shelley [name] MRN: [id number] DOB:[birth year], 72 y.o., female Today's Date: 06/08/2022  PCP: Kathyrn Drown, MD REFERRING PROVIDER: Kathyrn Drown, MD  END OF SESSION:  PT End of Session - 06/08/22 1301     Visit Number 7    Number of Visits 25    Date for PT Re-Evaluation 08/06/22    Authorization Type Medicare & BCBS    Progress Note Due on Visit 10    PT Start Time 1300    PT Stop Time 1344    PT Time Calculation (min) 44 min    Equipment Utilized During Treatment Gait belt    Activity Tolerance Patient tolerated treatment well;Patient limited by fatigue    Behavior During Therapy WFL for tasks assessed/performed             Past Medical History:  Diagnosis Date   Diabetes ([place])    Diabetes mellitus without complication ([place])    End stage renal disease ([place])    HTN (hypertension)    Hyperlipidemia    Hypertension    Past Surgical History:  Procedure Laterality Date   ABDOMINAL HYSTERECTOMY     fibroids/complete hysterec 2000   APPENDECTOMY     CATARACT EXTRACTION Bilateral    COLONOSCOPY N/A 03/12/2016   Procedure: COLONOSCOPY;  Surgeon: Daneil Dolin, MD;  [facility];  Service: Endoscopy;  Laterality: N/A;  9:30 am   POLYPECTOMY  03/12/2016   Procedure: POLYPECTOMY;  Surgeon: Daneil Dolin, MD;  Location: [facility];  Service: Endoscopy;;  colon   YAG LASER APPLICATION Left 0000000   Procedure: YAG LASER APPLICATION;  Surgeon: Williams Che, MD;  Location: [facility];  Service: Ophthalmology;  Laterality: Left;   Patient Active Problem List   Diagnosis Date Noted   Immunosuppressed status ([place]) 04/18/2022   Hx of kidney transplant 10/14/2021   Hx of AKA (above knee amputation), left ([place]) 10/14/2021   Diabetic peripheral neuropathy associated with type 2 diabetes mellitus ([place]) 10/14/2021   Uncontrolled type 2 diabetes mellitus with hyperglycemia, with long-term current use of  insulin ([place]) 08/27/2017   Hyperlipidemia associated with type 2 diabetes mellitus ([place]) 03/18/2017   Chronic renal disease, stage V ([place]) 08/20/2015   Personal history of noncompliance with medical treatment, presenting hazards to health 08/20/2015   Proteinuria 07/04/2014   Uncontrolled type 2 diabetes mellitus with ESRD (end-stage renal disease) 05/21/2014   Hyperlipidemia 05/21/2014   HTN (hypertension) 05/21/2014    ONSET DATE: 04/17/2022 MD referral to PT  REFERRING DIAG:  89.612 (ICD-10-CM) - Hx of AKA (above knee amputation), left (HCC)  R53.1 (ICD-10-CM) - Weakness    THERAPY DIAG:  Muscle weakness (generalized)  Other abnormalities of gait and mobility  Decreased functional mobility and endurance  Unsteadiness on feet  Abnormal posture  Rationale for Evaluation and Treatment: Rehabilitation  SUBJECTIVE:   SUBJECTIVE STATEMENT:  she continues to weigh daily.  She has worn shrinker as PT recommended and no issues at night.     PERTINENT HISTORY: DM2, neuropathy, HTN, CHF, renal CA,CKD st 5,  ESRD, kidney transplant 08/30/2017, Thoracotomy with lobectomy 02/24/2018,   PAIN:  Are you having pain? Yes: NPRS scale: today 1/10 and no pain since last PT session.  Pain location: left residual limb posteriorly Pain description: spasm, "toothache" Aggravating factors:  prosthesis wear Relieving factors: remove prosthesis including liner within 5-10 min.   PRECAUTIONS: Other: no BP LUE  WEIGHT BEARING RESTRICTIONS: No  FALLS: Has patient fallen in last 6 months? Yes. Number of falls 4 denies injuries, shakes but not low blood sugar.   LIVING ENVIRONMENT: Lives with: lives with their family sister just moved into pt's home Lives in: House  single level Home Access: Stairs to enter Home layout: One level Stairs: Yes: External: 4 steps; on left going up Has following equipment at home: Single point cane, Environmental consultant - 2 wheeled, Environmental consultant - 4 wheeled, Wheelchair (manual),  [employer], and Grab bars  PLOF: Independent, Independent with household mobility without device, and Independent with community mobility without device prosthesis only for community activities  PATIENT GOALS:   walk in community. Be active without fear of falling.   OBJECTIVE:  COGNITION: Overall cognitive status: Within functional limits for tasks assessed   SENSATION: WFL  POSTURE: rounded shoulders, forward head, flexed trunk , and weight shift right  LOWER EXTREMITY ROM:  ROM P:passive  A:active Right eval Left eval  Hip flexion    Hip extension    Hip abduction    Hip adduction    Hip internal rotation    Hip external rotation    Knee flexion  WFL  Knee extension  WFL  Ankle dorsiflexion    Ankle plantarflexion    Ankle inversion    Ankle eversion     (Blank rows = not tested)  LOWER EXTREMITY MMT:  MMT Right eval Left eval  Hip flexion 5/5 4/5  Hip extension 4/5 4-/5  Hip abduction 4/5 4-/5  Hip adduction    Hip internal rotation    Hip external rotation    Knee flexion 5/5 4/5  Knee extension 5/5 4/5  Ankle dorsiflexion    Ankle plantarflexion    Ankle inversion    Ankle eversion    (Blank rows = not tested)  TRANSFERS: Sit to stand: Modified independence requires UE assist on armrests Stand to sit: Modified independence requires UE assist on armrests  GAIT: Gait pattern: step through pattern, decreased arm swing- Left, decreased step length- Right, decreased stance time- Left, decreased hip/knee flexion- Left, Left hip hike, antalgic, and abducted- Left Distance walked: 120' Assistive device utilized: arrived using rollator walker;  assessed with cane & no device. Level of assistance: cane CGA and no device Min A Gait velocity: 2.51 ft/sec with cane &  2.84 ft/sec no device (MinA)  FUNCTIONAL TESTs:  06/08/2022: TUG   with cane standard 12.12 sec & cognitive 12.66 sec  Without device standard 9.69 sec & cognitive 11.00 sec Berg Balance  45/56  [facility] PT Assessment - 06/08/22 1305       Standardized Balance Assessment   Standardized Balance Assessment Berg Balance Test      Berg Balance Test   Sit to Stand Able to stand without using hands and stabilize independently    Standing Unsupported Able to stand safely 2 minutes    Sitting with Back Unsupported but Feet Supported on Floor or Stool Able to sit safely and securely 2 minutes    Stand to Sit Sits safely with minimal use of hands    Transfers Able to transfer safely, minor use of hands    Standing Unsupported with Eyes Closed Able to stand 10 seconds safely    Standing Unsupported with Feet Together Able to place feet together independently and stand 1 minute safely    From Standing, Reach Forward with Outstretched Arm Can reach confidently >25 cm (10")    From Standing Position, Pick up Object from Floor Able to  pick up shoe safely and easily    From Standing Position, Turn to Look Behind Over each Shoulder Looks behind from both sides and weight shifts well    Turn 360 Degrees Able to turn 360 degrees safely but slowly    Standing Unsupported, Alternately Place Feet on Step/Stool Needs assistance to keep from falling or unable to try    Standing Unsupported, One Foot in [place] to take small step independently and hold 30 seconds    Standing on One Leg Tries to lift leg/unable to hold 3 seconds but remains standing independently    Total Score 45    Berg comment: BERG  < 36 high risk for falls (close to 100%) 46-51 moderate (>50%)   37-45 significant (>80%) 52-55 lower (> 25%)              Eval / 05/12/2022:  Timed up and go (TUG):  standard 14.40sec & Cognitive 18.69sec   Both with cane. Berg Balance Scale: 38/56    CARDIOVASCULAR RESPONSE: Functional activity: Berg & Gait assessment Pre-activity vitals: HR: 76 SpO2: 94% Post-activity vitals: HR: 84 SpO2: 95%   CURRENT PROSTHETIC WEAR ASSESSMENT:  Patient is independent with: skin check,  residual limb care, care of non-amputated limb, prosthetic cleaning, correct ply sock adjustment, and proper wear schedule/adjustment Patient is dependent with: donning & managing edema / limb volume Donning prosthesis: SBA Doffing prosthesis: Modified independence Prosthetic wear tolerance: 10-12 hours of 15-16 awake hours (62-80% of awake hours) limited by pain Prosthetic weight bearing tolerance: 5 minutes limited by fatigue Edema: pitting Residual limb condition: no open areas, no hair growth, normal color, temperature & moisture, cylindrical shape Prosthetic description: silicon gel liner, suction pin sleeve with flexible inner socket supspension, total contact socket, dynamic response foot K code/activity level with prosthetic use: Level 3    TODAY'S TREATMENT:                                                                                                                             DATE:  06/08/2022: Neuromuscular Re-education: See objective for TUG & Berg test  Therapeutic Exercise: PT instructed verbally in components of well-rounded fitness plan to include flexibility, strength, balance & endurance.  Leg press 100# 15 reps 2 sets. PT demo with pt performing with too heavy & too light resistance.  Nustep recumbent stepper seat 11 with BLEs & BUEs for 12 min level 5.  PT instructed in use at gym. Pt considering joining [employer].   06/03/2022 Prosthetic Training with Left TTA prosthesis:  Pt brought 5" width tubular shrinker that she got from prosthetist. She demo what she has been doing which pulled section above knee so high that it rolled into >1" "tourniquet"   PT demo how high above knee to pull shrinker for minimal rolling and folding back section below knee for triple layer.  Pt verbalized understanding.  Patient ambulated 100 foot x 2 with cane with supervision.  Therapeutic exercise: Step up and over Bosu round side up with two-step approach for momentum with  bilateral upper extremity support 5 reps on each lower extremity.  Neuromuscular Re-education for balance: Rocker board with PT demo & verbal cues on technique.  BUE support with goal to decrease wt bearing, mirror for visual feedback / upright trunk.  Ant / level / post and right / level / left 1 min ea with 1st set on square board with 2 pivots and progressed 2nd set to round board single pivot.  Working on decreasing UE support on //bars.  Standing on foam pad alternate LE kicks 10 reps ea LE - flex, abd & ext with intermittent UE touch more with prosthetic stance. BUE support hovering / touch intermittently.  4-square stepping CW / CCW with cane 2 reps ea with CGA for balance and 1 rep without an assistive device with min assist for balance losses.    06/01/2022: Prosthetic Training with Left TTA prosthesis:  PT recommending weighing at least every other morning with prosthesis to monitor fluid retention. Pt verbalized understanding. Pt fatigued quickly with some ShOB with 3-5 min standing activities. Pt arrived & exited with rollator walker and amb in clinic with [facility] with CGA.  PT demo & verbal cues on getting in / out of car which is higher with running board.  Pt required minA even with using BUEs on grab bars of car.  PT demo / verbal cues on sitting at edge with seat all the way back & reclined, scoot pelvis back to middle of vehicle when turning feet in or out. Pt demo & verbalized understanding.     Neuromuscular Re-education for balance: Rocker board with PT demo & verbal cues on technique.  BUE support with goal to decrease wt bearing, mirror for visual feedback / upright trunk.  Ant / level / post and right / level / left 1 min ea with 1st set on square board with 2 pivots and progressed 2nd set to round board single pivot.   Standing on foam pad alternate LE kicks 10 reps ea LE - flex, abd & ext with intermittent UE touch more with prosthetic stance. BUE support hovering / touch  intermittently.  4-square stepping CW / CCW with cane 2 reps ea with minA for balance.  Pt improved balance slightly with 2nd rep.    HOME EXERCISE PROGRAM: Access Code: [email or web address] URL: [email or web address] Date: 05/18/2022 Prepared by: Jamey Reas  Exercises - Tandem Walking with Counter Support  - 1 x daily - 5-7 x weekly - 1 sets - 3-5 reps - Backward Walking with Counter Support  - 1 x daily - 5-7 x weekly - 1 sets - 3-5 reps - [place] with Counter Support  - 1 x daily - 5-7 x weekly - 1 sets - 3-5 reps - Standing Tandem Balance with Counter Support  - 1 x daily - 5-7 x weekly - 1 sets - 1 reps - 50 seconds hold - Standing Rolling Forward & Back, Side to Side and Circles  - 1 x daily - 5-7 x weekly - 1 sets - 10 reps  Increasing your activity level is important.  Short distances which is walking from one room to another. Work to increase frequency back to prior level.  Long distance is your highest tolerance for you. Walk until you feel you must rest. Back or leg pain or general fatigue are indicators to maximum tolerance. Monitor by distance or time. Try to walk your BEST  distance 1 times per day. You should see this increase over time.   Medium distances are entering & exiting your home or community with limited distances. Start with 4 medium walks which is one outing to one location and increase number of tolerated amounts per day.  ASSESSMENT: CLINICAL IMPRESSION: changing how she is wearing shrinker at night seems to have improved her pain issues.  She met all STGs set for first 30 days.  PT introduced fitness plan including gym 2-3 x/wk which she appears to understand.    OBJECTIVE IMPAIRMENTS: Abnormal gait, cardiopulmonary status limiting activity, decreased activity tolerance, decreased balance, decreased endurance, decreased knowledge of use of DME, decreased mobility, difficulty walking, decreased strength, increased edema, postural dysfunction, prosthetic  dependency , and pain.   ACTIVITY LIMITATIONS: carrying, lifting, sitting, standing, squatting, stairs, transfers, and locomotion level  PARTICIPATION LIMITATIONS: meal prep, driving, shopping, and community activity  PERSONAL FACTORS: Fitness, Time since onset of injury/illness/exacerbation, and 3+ comorbidities: see PMH  are also affecting patient's functional outcome.   REHAB POTENTIAL: Good  CLINICAL DECISION MAKING: Evolving/moderate complexity  EVALUATION COMPLEXITY: Moderate   GOALS: Goals reviewed with patient? Yes  SHORT TERM GOALS: Target date: 06/11/2022  Patient donnes prosthesis correctly modified independent & verbalizes proper use of compression shrinker. Baseline: SEE OBJECTIVE DATA Goal status: MET 06/08/2022 2.  Patient tolerates prosthesis >12 hrs total /day without skin issues or limb pain <3/10 after standing. Baseline: SEE OBJECTIVE DATA Goal status:  MET 06/08/2022  3. Timed Up & Go with cane <13.5 sec Baseline: SEE OBJECTIVE DATA Goal status:  MET 06/08/2022  4. Patient ambulates 150' with cane & prosthesis with supervision. Baseline: SEE OBJECTIVE DATA Goal status:  MET 06/08/2022  5. Patient demo & verbalizes understanding of initial HEP Baseline: SEE OBJECTIVE DATA Goal status:  MET 06/08/2022  LONG TERM GOALS: Target date: 08/06/2022  Patient demonstrates & verbalized understanding of prosthetic care to enable safe utilization of prosthesis. Baseline: SEE OBJECTIVE DATA Goal status:  Ongoing 05/18/2022  Patient tolerates prosthesis wear >90% of awake hours without skin or limb pain issues. Baseline: SEE OBJECTIVE DATA Goal status: Ongoing 05/18/2022  Merrilee Jansky Balance >46/56 Baseline: SEE OBJECTIVE DATA Goal status:  Ongoing 05/18/2022  Patient ambulates >500' with prosthesis and cane or less independently Baseline: SEE OBJECTIVE DATA Goal status: Ongoing 05/18/2022  Patient negotiates ramps, curbs & stairs with single rail with prosthesis and cane  or less independently. Baseline: SEE OBJECTIVE DATA Goal status:  Ongoing 05/18/2022  Patient verbalizes & demonstrates understanding of how to properly use fitness equipment to return to gym. Baseline: SEE OBJECTIVE DATA Goal status:  Ongoing 05/18/2022  PLAN:  PT FREQUENCY: 2x/week  PT DURATION: 12 weeks  PLANNED INTERVENTIONS: Therapeutic exercises, Therapeutic activity, Neuromuscular re-education, Balance training, Gait training, Patient/Family education, Self Care, Stair training, Vestibular training, Canalith repositioning, Prosthetic training, DME instructions, and physical performance testing  PLAN FOR NEXT SESSION:   continue instruction in fitness plan,  continue balance activities.    Jamey Reas, PT, DPT 06/08/2022, 1:53 PM

## 2022-06-10 ENCOUNTER — Encounter: Payer: Self-pay | Admitting: Physical Therapy

## 2022-06-10 ENCOUNTER — Ambulatory Visit (INDEPENDENT_AMBULATORY_CARE_PROVIDER_SITE_OTHER): Payer: Medicare Other | Admitting: Physical Therapy

## 2022-06-10 DIAGNOSIS — M6281 Muscle weakness (generalized): Secondary | ICD-10-CM | POA: Diagnosis not present

## 2022-06-10 DIAGNOSIS — R2681 Unsteadiness on feet: Secondary | ICD-10-CM | POA: Diagnosis not present

## 2022-06-10 DIAGNOSIS — R293 Abnormal posture: Secondary | ICD-10-CM

## 2022-06-10 DIAGNOSIS — R2689 Other abnormalities of gait and mobility: Secondary | ICD-10-CM

## 2022-06-10 DIAGNOSIS — Z7409 Other reduced mobility: Secondary | ICD-10-CM

## 2022-06-10 NOTE — Therapy (Signed)
OUTPATIENT PHYSICAL THERAPY TREATMENT   Patient Name: Shelley [name] MRN: [id number] DOB:1950-08-16, 72 y.o., female Today's Date: 06/10/2022  PCP: Kathyrn Drown, MD REFERRING PROVIDER: Kathyrn Drown, MD  END OF SESSION:  PT End of Session - 06/10/22 1348     Visit Number 8    Number of Visits 25    Date for PT Re-Evaluation 08/06/22    Authorization Type Medicare & BCBS    Progress Note Due on Visit 10    PT Start Time 1345    PT Stop Time 1429    PT Time Calculation (min) 44 min    Equipment Utilized During Treatment Gait belt    Activity Tolerance Patient tolerated treatment well;Patient limited by fatigue    Behavior During Therapy WFL for tasks assessed/performed             Past Medical History:  Diagnosis Date   Diabetes ([place])    Diabetes mellitus without complication ([place])    End stage renal disease ([place])    HTN (hypertension)    Hyperlipidemia    Hypertension    Past Surgical History:  Procedure Laterality Date   ABDOMINAL HYSTERECTOMY     fibroids/complete hysterec 2000   APPENDECTOMY     CATARACT EXTRACTION Bilateral    COLONOSCOPY N/A 03/12/2016   Procedure: COLONOSCOPY;  Surgeon: Daneil Dolin, MD;  [facility];  Service: Endoscopy;  Laterality: N/A;  9:30 am   POLYPECTOMY  03/12/2016   Procedure: POLYPECTOMY;  Surgeon: Daneil Dolin, MD;  Location: [facility];  Service: Endoscopy;;  colon   YAG LASER APPLICATION Left 0000000   Procedure: YAG LASER APPLICATION;  Surgeon: Williams Che, MD;  Location: [facility];  Service: Ophthalmology;  Laterality: Left;   Patient Active Problem List   Diagnosis Date Noted   Immunosuppressed status ([place]) 04/18/2022   Hx of kidney transplant 10/14/2021   Hx of AKA (above knee amputation), left ([place]) 10/14/2021   Diabetic peripheral neuropathy associated with type 2 diabetes mellitus ([place]) 10/14/2021   Uncontrolled type 2 diabetes mellitus with hyperglycemia, with long-term current use of  insulin ([place]) 08/27/2017   Hyperlipidemia associated with type 2 diabetes mellitus ([place]) 03/18/2017   Chronic renal disease, stage V ([place]) 08/20/2015   Personal history of noncompliance with medical treatment, presenting hazards to health 08/20/2015   Proteinuria 07/04/2014   Uncontrolled type 2 diabetes mellitus with ESRD (end-stage renal disease) 05/21/2014   Hyperlipidemia 05/21/2014   HTN (hypertension) 05/21/2014    ONSET DATE: 04/17/2022 MD referral to PT  REFERRING DIAG:  89.612 (ICD-10-CM) - Hx of AKA (above knee amputation), left (HCC)  R53.1 (ICD-10-CM) - Weakness    THERAPY DIAG:  Muscle weakness (generalized)  Other abnormalities of gait and mobility  Decreased functional mobility and endurance  Unsteadiness on feet  Abnormal posture  Rationale for Evaluation and Treatment: Rehabilitation  SUBJECTIVE:   SUBJECTIVE STATEMENT:  She continues to have no night pain from shrinker now and no issues during day.  She plans to join [employer] in April.    PERTINENT HISTORY: DM2, neuropathy, HTN, CHF, renal CA,CKD st 5,  ESRD, kidney transplant 08/30/2017, Thoracotomy with lobectomy 02/24/2018,   PAIN:  Are you having pain? Yes: NPRS scale: today 1/10 and no pain since last PT session.  Pain location: left residual limb posteriorly Pain description: spasm, "toothache" Aggravating factors:  prosthesis wear Relieving factors: remove prosthesis including liner within 5-10 min.   PRECAUTIONS: Other: no BP LUE  WEIGHT BEARING RESTRICTIONS: No  FALLS: Has patient fallen in last 6 months? Yes. Number of falls 4 denies injuries, shakes but not low blood sugar.   LIVING ENVIRONMENT: Lives with: lives with their family sister just moved into pt's home Lives in: House  single level Home Access: Stairs to enter Home layout: One level Stairs: Yes: External: 4 steps; on left going up Has following equipment at home: Single point cane, Environmental consultant - 2 wheeled, Environmental consultant - 4  wheeled, Wheelchair (manual), [employer], and Grab bars  PLOF: Independent, Independent with household mobility without device, and Independent with community mobility without device prosthesis only for community activities  PATIENT GOALS:   walk in community. Be active without fear of falling.   OBJECTIVE:  COGNITION: Overall cognitive status: Within functional limits for tasks assessed   SENSATION: WFL  POSTURE: rounded shoulders, forward head, flexed trunk , and weight shift right  LOWER EXTREMITY ROM:  ROM P:passive  A:active Right eval Left eval  Hip flexion    Hip extension    Hip abduction    Hip adduction    Hip internal rotation    Hip external rotation    Knee flexion  WFL  Knee extension  WFL  Ankle dorsiflexion    Ankle plantarflexion    Ankle inversion    Ankle eversion     (Blank rows = not tested)  LOWER EXTREMITY MMT:  MMT Right eval Left eval  Hip flexion 5/5 4/5  Hip extension 4/5 4-/5  Hip abduction 4/5 4-/5  Hip adduction    Hip internal rotation    Hip external rotation    Knee flexion 5/5 4/5  Knee extension 5/5 4/5  Ankle dorsiflexion    Ankle plantarflexion    Ankle inversion    Ankle eversion    (Blank rows = not tested)  TRANSFERS: Sit to stand: Modified independence requires UE assist on armrests Stand to sit: Modified independence requires UE assist on armrests  GAIT: 06/10/2022: gait velocity without AD 3.03 ft/sec  Eval / 05/12/2022:  Gait pattern: step through pattern, decreased arm swing- Left, decreased step length- Right, decreased stance time- Left, decreased hip/knee flexion- Left, Left hip hike, antalgic, and abducted- Left Distance walked: 120' Assistive device utilized: arrived using rollator walker;  assessed with cane & no device. Level of assistance: cane CGA and no device Min A Gait velocity: 2.51 ft/sec with cane &  2.84 ft/sec no device (MinA)  FUNCTIONAL TESTs:  06/10/2022: 4-square step test with cane  17.98sec with CGA and without cane 20.13 sec with minA  06/08/2022: TUG   with cane standard 12.12 sec & cognitive 12.66 sec  Without device standard 9.69 sec & cognitive 11.00 sec Berg Balance 45/56  Eval / 05/12/2022:  Timed up and go (TUG):  standard 14.40sec & Cognitive 18.69sec   Both with cane. Berg Balance Scale: 38/56    CARDIOVASCULAR RESPONSE: Functional activity: Berg & Gait assessment Pre-activity vitals: HR: 76 SpO2: 94% Post-activity vitals: HR: 84 SpO2: 95%   CURRENT PROSTHETIC WEAR ASSESSMENT:  Patient is independent with: skin check, residual limb care, care of non-amputated limb, prosthetic cleaning, correct ply sock adjustment, and proper wear schedule/adjustment Patient is dependent with: donning & managing edema / limb volume Donning prosthesis: SBA Doffing prosthesis: Modified independence Prosthetic wear tolerance: 10-12 hours of 15-16 awake hours (62-80% of awake hours) limited by pain Prosthetic weight bearing tolerance: 5 minutes limited by fatigue Edema: pitting Residual limb condition: no open areas, no hair growth,  normal color, temperature & moisture, cylindrical shape Prosthetic description: silicon gel liner, suction pin sleeve with flexible inner socket supspension, total contact socket, dynamic response foot K code/activity level with prosthetic use: Level 3    TODAY'S TREATMENT:                                                                                                                             DATE:  06/10/2022: Prosthetic Training with Left TTA prosthesis: See gait velocity & 4-square step test in objective data Sit to / from stand from 18" chairs without armrests using seat, 19" rolling stool (close to wall for safety), 19" rolling desk chair (close to wall for safety) and corner of 18" chair.  Pt demo & verbal cues on technique with goal minimal UE push / only touching to ensure chair is under her.  Pt reported issues with std toilet  at family's home, new desk chair and recliner.  PT verbal & simulated demo how to get recliner foot board down engaging abdominals.  PT used seated knee flexion machine 25# to simulate motion.  Pt verbalized better understanding.   Pt plans to fly to [place] this summer with questions on travel.  PT verbally educated on flying with prosthesis. Pt verbalized understanding.   Pt able to pick cane up off floor with PT cues on technique but no physical assist.  Pt amb 100' X 3 in clinic without AD with supervision.    06/08/2022: Neuromuscular Re-education: See objective for TUG & Berg test  Therapeutic Exercise: PT instructed verbally in components of well-rounded fitness plan to include flexibility, strength, balance & endurance.  Leg press 100# 15 reps 2 sets. PT demo with pt performing with too heavy & too light resistance.  Nustep recumbent stepper seat 11 with BLEs & BUEs for 12 min level 5.  PT instructed in use at gym. Pt considering joining [employer].   06/03/2022 Prosthetic Training with Left TTA prosthesis:  Pt brought 5" width tubular shrinker that she got from prosthetist. She demo what she has been doing which pulled section above knee so high that it rolled into >1" "tourniquet"   PT demo how high above knee to pull shrinker for minimal rolling and folding back section below knee for triple layer.  Pt verbalized understanding.  Patient ambulated 100 foot x 2 with cane with supervision.  Therapeutic exercise: Step up and over Bosu round side up with two-step approach for momentum with bilateral upper extremity support 5 reps on each lower extremity.  Neuromuscular Re-education for balance: Rocker board with PT demo & verbal cues on technique.  BUE support with goal to decrease wt bearing, mirror for visual feedback / upright trunk.  Ant / level / post and right / level / left 1 min ea with 1st set on square board with 2 pivots and progressed 2nd set to round board single  pivot.  Working on decreasing UE support on //bars.  Standing on foam pad alternate LE kicks 10 reps ea LE - flex, abd & ext with intermittent UE touch more with prosthetic stance. BUE support hovering / touch intermittently.  4-square stepping CW / CCW with cane 2 reps ea with CGA for balance and 1 rep without an assistive device with min assist for balance losses.     HOME EXERCISE PROGRAM: Access Code: [email or web address] URL: [email or web address] Date: 05/18/2022 Prepared by: Jamey Reas  Exercises - Tandem Walking with Counter Support  - 1 x daily - 5-7 x weekly - 1 sets - 3-5 reps - Backward Walking with Counter Support  - 1 x daily - 5-7 x weekly - 1 sets - 3-5 reps - [place] with Counter Support  - 1 x daily - 5-7 x weekly - 1 sets - 3-5 reps - Standing Tandem Balance with Counter Support  - 1 x daily - 5-7 x weekly - 1 sets - 1 reps - 50 seconds hold - Standing Rolling Forward & Back, Side to Side and Circles  - 1 x daily - 5-7 x weekly - 1 sets - 10 reps  Increasing your activity level is important.  Short distances which is walking from one room to another. Work to increase frequency back to prior level.  Long distance is your highest tolerance for you. Walk until you feel you must rest. Back or leg pain or general fatigue are indicators to maximum tolerance. Monitor by distance or time. Try to walk your BEST distance 1 times per day. You should see this increase over time.   Medium distances are entering & exiting your home or community with limited distances. Start with 4 medium walks which is one outing to one location and increase number of tolerated amounts per day.  ASSESSMENT: CLINICAL IMPRESSION:  PT educated on sit to/from stand with minimal UE use with variable chair types.  She improved functional task with progressively difficulty to chair type.   OBJECTIVE IMPAIRMENTS: Abnormal gait, cardiopulmonary status limiting activity, decreased activity tolerance,  decreased balance, decreased endurance, decreased knowledge of use of DME, decreased mobility, difficulty walking, decreased strength, increased edema, postural dysfunction, prosthetic dependency , and pain.   ACTIVITY LIMITATIONS: carrying, lifting, sitting, standing, squatting, stairs, transfers, and locomotion level  PARTICIPATION LIMITATIONS: meal prep, driving, shopping, and community activity  PERSONAL FACTORS: Fitness, Time since onset of injury/illness/exacerbation, and 3+ comorbidities: see PMH  are also affecting patient's functional outcome.   REHAB POTENTIAL: Good  CLINICAL DECISION MAKING: Evolving/moderate complexity  EVALUATION COMPLEXITY: Moderate   GOALS: Goals reviewed with patient? Yes  SHORT TERM GOALS: Target date: 07/09/2022  Patient verbalizes proper adjusting ply socks with limb volume changes. Baseline: SEE OBJECTIVE DATA Goal status: SET 06/10/2022 2.  Patient tolerates prosthesis >90% awake hrs /day without skin issues or limb pain </= 2/10 after standing. Baseline: SEE OBJECTIVE DATA Goal status:  SET 06/10/2022  3. 4-square test with cane <15 sec safely Baseline: SEE OBJECTIVE DATA Goal status:  SET 06/10/2022  4. Patient ambulates 300' with cane & prosthesis with supervision. Baseline: SEE OBJECTIVE DATA Goal status:  SET 06/10/2022  5. Patient demo & verbalizes understanding of updated HEP Baseline: SEE OBJECTIVE DATA Goal status:  SET 06/10/2022  LONG TERM GOALS: Target date: 08/06/2022  Patient demonstrates & verbalized understanding of prosthetic care to enable safe utilization of prosthesis. Baseline: SEE OBJECTIVE DATA Goal status:  Ongoing 05/18/2022  Patient tolerates prosthesis wear >90% of awake hours without skin or limb pain issues. Baseline: SEE OBJECTIVE  DATA Goal status: Ongoing 05/18/2022  Merrilee Jansky Balance >46/56 Baseline: SEE OBJECTIVE DATA Goal status:  Ongoing 05/18/2022  Patient ambulates >500' with prosthesis and cane or less  independently Baseline: SEE OBJECTIVE DATA Goal status: Ongoing 05/18/2022  Patient negotiates ramps, curbs & stairs with single rail with prosthesis and cane or less independently. Baseline: SEE OBJECTIVE DATA Goal status:  Ongoing 05/18/2022  Patient verbalizes & demonstrates understanding of how to properly use fitness equipment to return to gym. Baseline: SEE OBJECTIVE DATA Goal status:  Ongoing 05/18/2022  PLAN:  PT FREQUENCY: 2x/week  PT DURATION: 12 weeks  PLANNED INTERVENTIONS: Therapeutic exercises, Therapeutic activity, Neuromuscular re-education, Balance training, Gait training, Patient/Family education, Self Care, Stair training, Vestibular training, Canalith repositioning, Prosthetic training, DME instructions, and physical performance testing  PLAN FOR NEXT SESSION:   check how sit to/from stand at home went, continue instruction in fitness plan,  continue balance activities.    Jamey Reas, PT, DPT 06/10/2022, 2:42 PM

## 2022-06-15 ENCOUNTER — Ambulatory Visit (INDEPENDENT_AMBULATORY_CARE_PROVIDER_SITE_OTHER): Payer: Medicare Other | Admitting: Physical Therapy

## 2022-06-15 ENCOUNTER — Encounter: Payer: Self-pay | Admitting: Physical Therapy

## 2022-06-15 DIAGNOSIS — Z7409 Other reduced mobility: Secondary | ICD-10-CM

## 2022-06-15 DIAGNOSIS — R2689 Other abnormalities of gait and mobility: Secondary | ICD-10-CM

## 2022-06-15 DIAGNOSIS — M6281 Muscle weakness (generalized): Secondary | ICD-10-CM

## 2022-06-15 DIAGNOSIS — R2681 Unsteadiness on feet: Secondary | ICD-10-CM

## 2022-06-15 DIAGNOSIS — R293 Abnormal posture: Secondary | ICD-10-CM

## 2022-06-15 NOTE — Therapy (Signed)
OUTPATIENT PHYSICAL THERAPY TREATMENT   Patient Name: Shelley [name] MRN: [id number] DOB:08-31-1950, 72 y.o., female Today's Date: 06/15/2022  PCP: Kathyrn Drown, MD REFERRING PROVIDER: Kathyrn Drown, MD  END OF SESSION:  PT End of Session - 06/15/22 1337     Visit Number 9    Number of Visits 25    Date for PT Re-Evaluation 08/06/22    Authorization Type Medicare & BCBS    Progress Note Due on Visit 10    PT Start Time [id number]    PT Stop Time 1428    PT Time Calculation (min) 51 min    Equipment Utilized During Treatment Gait belt    Activity Tolerance Patient tolerated treatment well;Patient limited by fatigue    Behavior During Therapy WFL for tasks assessed/performed             Past Medical History:  Diagnosis Date   Diabetes ([place])    Diabetes mellitus without complication ([place])    End stage renal disease ([place])    HTN (hypertension)    Hyperlipidemia    Hypertension    Past Surgical History:  Procedure Laterality Date   ABDOMINAL HYSTERECTOMY     fibroids/complete hysterec 2000   APPENDECTOMY     CATARACT EXTRACTION Bilateral    COLONOSCOPY N/A 03/12/2016   Procedure: COLONOSCOPY;  Surgeon: Daneil Dolin, MD;  [facility];  Service: Endoscopy;  Laterality: N/A;  9:30 am   POLYPECTOMY  03/12/2016   Procedure: POLYPECTOMY;  Surgeon: Daneil Dolin, MD;  Location: [facility];  Service: Endoscopy;;  colon   YAG LASER APPLICATION Left 0000000   Procedure: YAG LASER APPLICATION;  Surgeon: Williams Che, MD;  Location: [facility];  Service: Ophthalmology;  Laterality: Left;   Patient Active Problem List   Diagnosis Date Noted   Immunosuppressed status ([place]) 04/18/2022   Hx of kidney transplant 10/14/2021   Hx of AKA (above knee amputation), left ([place]) 10/14/2021   Diabetic peripheral neuropathy associated with type 2 diabetes mellitus ([place]) 10/14/2021   Uncontrolled type 2 diabetes mellitus with hyperglycemia, with long-term current use of  insulin ([place]) 08/27/2017   Hyperlipidemia associated with type 2 diabetes mellitus ([place]) 03/18/2017   Chronic renal disease, stage V ([place]) 08/20/2015   Personal history of noncompliance with medical treatment, presenting hazards to health 08/20/2015   Proteinuria 07/04/2014   Uncontrolled type 2 diabetes mellitus with ESRD (end-stage renal disease) 05/21/2014   Hyperlipidemia 05/21/2014   HTN (hypertension) 05/21/2014    ONSET DATE: 04/17/2022 MD referral to PT  REFERRING DIAG:  89.612 (ICD-10-CM) - Hx of AKA (above knee amputation), left (HCC)  R53.1 (ICD-10-CM) - Weakness    THERAPY DIAG:  Muscle weakness (generalized)  Other abnormalities of gait and mobility  Decreased functional mobility and endurance  Unsteadiness on feet  Abnormal posture  Rationale for Evaluation and Treatment: Rehabilitation  SUBJECTIVE:   SUBJECTIVE STATEMENT:  Her limb pain is "100%" better since changing how she is wearing shrinker at night.   PERTINENT HISTORY: DM2, neuropathy, HTN, CHF, renal CA,CKD st 5,  ESRD, kidney transplant 08/30/2017, Thoracotomy with lobectomy 02/24/2018,   PAIN:  Are you having pain? Yes: NPRS scale: today 0/10 and no pain since last PT session.  Pain location: left residual limb posteriorly Pain description: spasm, "toothache" Aggravating factors:  prosthesis wear Relieving factors: remove prosthesis including liner within 5-10 min.   PRECAUTIONS: Other: no BP LUE  WEIGHT BEARING RESTRICTIONS: No  FALLS: Has patient fallen  in last 6 months? Yes. Number of falls 4 denies injuries, shakes but not low blood sugar.   LIVING ENVIRONMENT: Lives with: lives with their family sister just moved into pt's home Lives in: House  single level Home Access: Stairs to enter Home layout: One level Stairs: Yes: External: 4 steps; on left going up Has following equipment at home: Single point cane, Environmental consultant - 2 wheeled, Environmental consultant - 4 wheeled, Wheelchair (manual), [employer], and  Grab bars  PLOF: Independent, Independent with household mobility without device, and Independent with community mobility without device prosthesis only for community activities  PATIENT GOALS:   walk in community. Be active without fear of falling.   OBJECTIVE:  COGNITION: Overall cognitive status: Within functional limits for tasks assessed   SENSATION: WFL  POSTURE: rounded shoulders, forward head, flexed trunk , and weight shift right  LOWER EXTREMITY ROM:  ROM P:passive  A:active Right eval Left eval  Hip flexion    Hip extension    Hip abduction    Hip adduction    Hip internal rotation    Hip external rotation    Knee flexion  WFL  Knee extension  WFL  Ankle dorsiflexion    Ankle plantarflexion    Ankle inversion    Ankle eversion     (Blank rows = not tested)  LOWER EXTREMITY MMT:  MMT Right eval Left eval  Hip flexion 5/5 4/5  Hip extension 4/5 4-/5  Hip abduction 4/5 4-/5  Hip adduction    Hip internal rotation    Hip external rotation    Knee flexion 5/5 4/5  Knee extension 5/5 4/5  Ankle dorsiflexion    Ankle plantarflexion    Ankle inversion    Ankle eversion    (Blank rows = not tested)  TRANSFERS: Sit to stand: Modified independence requires UE assist on armrests Stand to sit: Modified independence requires UE assist on armrests  GAIT: 06/10/2022: gait velocity without AD 3.03 ft/sec  Eval / 05/12/2022:  Gait pattern: step through pattern, decreased arm swing- Left, decreased step length- Right, decreased stance time- Left, decreased hip/knee flexion- Left, Left hip hike, antalgic, and abducted- Left Distance walked: 120' Assistive device utilized: arrived using rollator walker;  assessed with cane & no device. Level of assistance: cane CGA and no device Min A Gait velocity: 2.51 ft/sec with cane &  2.84 ft/sec no device (MinA)  FUNCTIONAL TESTs:  06/10/2022: 4-square step test with cane 17.98sec with CGA and without cane 20.13 sec with  minA  06/08/2022: TUG   with cane standard 12.12 sec & cognitive 12.66 sec  Without device standard 9.69 sec & cognitive 11.00 sec Berg Balance 45/56  Eval / 05/12/2022:  Timed up and go (TUG):  standard 14.40sec & Cognitive 18.69sec   Both with cane. Berg Balance Scale: 38/56    CARDIOVASCULAR RESPONSE: Functional activity: Berg & Gait assessment Pre-activity vitals: HR: 76 SpO2: 94% Post-activity vitals: HR: 84 SpO2: 95%   CURRENT PROSTHETIC WEAR ASSESSMENT:  Patient is independent with: skin check, residual limb care, care of non-amputated limb, prosthetic cleaning, correct ply sock adjustment, and proper wear schedule/adjustment Patient is dependent with: donning & managing edema / limb volume Donning prosthesis: SBA Doffing prosthesis: Modified independence Prosthetic wear tolerance: 10-12 hours of 15-16 awake hours (62-80% of awake hours) limited by pain Prosthetic weight bearing tolerance: 5 minutes limited by fatigue Edema: pitting Residual limb condition: no open areas, no hair growth, normal color, temperature & moisture, cylindrical shape Prosthetic description:  silicon gel liner, suction pin sleeve with flexible inner socket supspension, total contact socket, dynamic response foot K code/activity level with prosthetic use: Level 3    TODAY'S TREATMENT:                                                                                                                             DATE:  06/15/2022: Therapeutic Exercise: Nustep seat 11 level 7 with BLEs & BUEs for 5 min. Recumbent bike - PT demo & verbal cues on technique including seat adjustment.  Seat 7 pt rode bike for 3 min.  Leg press BLEs 100# 15 reps X 2, single LEs 50# 10 reps 2 sets ea LE Seated row 20# 15 reps Seated chest press 20# 15 reps 2 sets Knee ext machine BLEs 15# 15 reps PT cued on set up for gym Seated knee flex machine BLEs 25# 15 reps PT cued on set up for gym PT recommending 4-6 UE machines,  1-2 trunk & 4-6 LE machines.  Work opposing muscle groups even if weight is different.    Therapeutic Activities:  Sit to /from stand 18" chair 3 reps with BUEs, 3 reps without UE assist;   PT demo & verbal cues on moving chair under & out from under table. Pt return demo understanding.  PT demo & verbal cues on sitting with feet supported on ground or stool. Pt verbalized understanding.    06/10/2022: Prosthetic Training with Left TTA prosthesis: See gait velocity & 4-square step test in objective data Sit to / from stand from 18" chairs without armrests using seat, 19" rolling stool (close to wall for safety), 19" rolling desk chair (close to wall for safety) and corner of 18" chair.  Pt demo & verbal cues on technique with goal minimal UE push / only touching to ensure chair is under her.  Pt reported issues with std toilet at family's home, new desk chair and recliner.  PT verbal & simulated demo how to get recliner foot board down engaging abdominals.  PT used seated knee flexion machine 25# to simulate motion.  Pt verbalized better understanding.   Pt plans to fly to [place] this summer with questions on travel.  PT verbally educated on flying with prosthesis. Pt verbalized understanding.   Pt able to pick cane up off floor with PT cues on technique but no physical assist.  Pt amb 100' X 3 in clinic without AD with supervision.    06/08/2022: Neuromuscular Re-education: See objective for TUG & Berg test  Therapeutic Exercise: PT instructed verbally in components of well-rounded fitness plan to include flexibility, strength, balance & endurance.  Leg press 100# 15 reps 2 sets. PT demo with pt performing with too heavy & too light resistance.  Nustep recumbent stepper seat 11 with BLEs & BUEs for 12 min level 5.  PT instructed in use at gym. Pt considering joining [employer].     HOME EXERCISE PROGRAM: Access Code:  [email or web address] URL: [email or web address] Date:  05/18/2022 Prepared by: Jamey Reas  Exercises - Tandem Walking with Counter Support  - 1 x daily - 5-7 x weekly - 1 sets - 3-5 reps - Backward Walking with Counter Support  - 1 x daily - 5-7 x weekly - 1 sets - 3-5 reps - [place] with Counter Support  - 1 x daily - 5-7 x weekly - 1 sets - 3-5 reps - Standing Tandem Balance with Counter Support  - 1 x daily - 5-7 x weekly - 1 sets - 1 reps - 50 seconds hold - Standing Rolling Forward & Back, Side to Side and Circles  - 1 x daily - 5-7 x weekly - 1 sets - 10 reps  Increasing your activity level is important.  Short distances which is walking from one room to another. Work to increase frequency back to prior level.  Long distance is your highest tolerance for you. Walk until you feel you must rest. Back or leg pain or general fatigue are indicators to maximum tolerance. Monitor by distance or time. Try to walk your BEST distance 1 times per day. You should see this increase over time.   Medium distances are entering & exiting your home or community with limited distances. Start with 4 medium walks which is one outing to one location and increase number of tolerated amounts per day.  ASSESSMENT: CLINICAL IMPRESSION:  Pt appears to understand basics of gym fitness program for endurance & strengthening.  Pt improved sit to/from stand and moving dining room chair to table.  Pt continues to benefit from skilled PT.   OBJECTIVE IMPAIRMENTS: Abnormal gait, cardiopulmonary status limiting activity, decreased activity tolerance, decreased balance, decreased endurance, decreased knowledge of use of DME, decreased mobility, difficulty walking, decreased strength, increased edema, postural dysfunction, prosthetic dependency , and pain.   ACTIVITY LIMITATIONS: carrying, lifting, sitting, standing, squatting, stairs, transfers, and locomotion level  PARTICIPATION LIMITATIONS: meal prep, driving, shopping, and community activity  PERSONAL FACTORS:  Fitness, Time since onset of injury/illness/exacerbation, and 3+ comorbidities: see PMH  are also affecting patient's functional outcome.   REHAB POTENTIAL: Good  CLINICAL DECISION MAKING: Evolving/moderate complexity  EVALUATION COMPLEXITY: Moderate   GOALS: Goals reviewed with patient? Yes  SHORT TERM GOALS: Target date: 07/09/2022  Patient verbalizes proper adjusting ply socks with limb volume changes. Baseline: SEE OBJECTIVE DATA Goal status: SET 06/10/2022 2.  Patient tolerates prosthesis >90% awake hrs /day without skin issues or limb pain </= 2/10 after standing. Baseline: SEE OBJECTIVE DATA Goal status:  SET 06/10/2022  3. 4-square test with cane <15 sec safely Baseline: SEE OBJECTIVE DATA Goal status:  SET 06/10/2022  4. Patient ambulates 300' with cane & prosthesis with supervision. Baseline: SEE OBJECTIVE DATA Goal status:  SET 06/10/2022  5. Patient demo & verbalizes understanding of updated HEP Baseline: SEE OBJECTIVE DATA Goal status:  SET 06/10/2022  LONG TERM GOALS: Target date: 08/06/2022  Patient demonstrates & verbalized understanding of prosthetic care to enable safe utilization of prosthesis. Baseline: SEE OBJECTIVE DATA Goal status:  Ongoing 05/18/2022  Patient tolerates prosthesis wear >90% of awake hours without skin or limb pain issues. Baseline: SEE OBJECTIVE DATA Goal status: Ongoing 05/18/2022  Merrilee Jansky Balance >46/56 Baseline: SEE OBJECTIVE DATA Goal status:  Ongoing 05/18/2022  Patient ambulates >500' with prosthesis and cane or less independently Baseline: SEE OBJECTIVE DATA Goal status: Ongoing 05/18/2022  Patient negotiates ramps, curbs & stairs with single rail with prosthesis and cane or less independently. Baseline: SEE OBJECTIVE  DATA Goal status:  Ongoing 05/18/2022  Patient verbalizes & demonstrates understanding of how to properly use fitness equipment to return to gym. Baseline: SEE OBJECTIVE DATA Goal status:  Ongoing  05/18/2022  PLAN:  PT FREQUENCY: 2x/week  PT DURATION: 12 weeks  PLANNED INTERVENTIONS: Therapeutic exercises, Therapeutic activity, Neuromuscular re-education, Balance training, Gait training, Patient/Family education, Self Care, Stair training, Vestibular training, Canalith repositioning, Prosthetic training, DME instructions, and physical performance testing  PLAN FOR NEXT SESSION:  do 10th visit progress note, continue instruction in fitness plan,  continue balance activities.    Jamey Reas, PT, DPT 06/15/2022, 2:30 PM

## 2022-06-17 ENCOUNTER — Encounter: Payer: Self-pay | Admitting: Physical Therapy

## 2022-06-17 ENCOUNTER — Ambulatory Visit (INDEPENDENT_AMBULATORY_CARE_PROVIDER_SITE_OTHER): Payer: Medicare Other | Admitting: Physical Therapy

## 2022-06-17 DIAGNOSIS — R2689 Other abnormalities of gait and mobility: Secondary | ICD-10-CM | POA: Diagnosis not present

## 2022-06-17 DIAGNOSIS — Z7409 Other reduced mobility: Secondary | ICD-10-CM

## 2022-06-17 DIAGNOSIS — M6281 Muscle weakness (generalized): Secondary | ICD-10-CM | POA: Diagnosis not present

## 2022-06-17 DIAGNOSIS — R2681 Unsteadiness on feet: Secondary | ICD-10-CM

## 2022-06-17 DIAGNOSIS — R293 Abnormal posture: Secondary | ICD-10-CM

## 2022-06-17 NOTE — Patient Instructions (Signed)
Increasing your activity level is important.  Short distances which is walking from one room to another. Work to increase frequency back to prior level.  Long distance is your highest tolerance for you. Walk until you feel you must rest. Back or leg pain or general fatigue are indicators to maximum tolerance. Monitor by distance or time. Try to walk your BEST distance 1-2 times per day. You should see this increase over time.    Medium distances are entering & exiting your home or community with limited distances. Start with 4 medium walks which is one outing to one location and increase number of tolerated amounts per day.

## 2022-06-17 NOTE — Therapy (Signed)
OUTPATIENT PHYSICAL THERAPY TREATMENT & PROGRESS NOTE   Patient Name: Shelley [name] MRN: [id number] DOB:1950-11-05, 72 y.o., female Today's Date: 06/17/2022  PCP: Kathyrn Drown, MD REFERRING PROVIDER: Kathyrn Drown, MD  Progress Note Reporting Period 05/12/2022 to 06/17/2022  See note below for Objective Data and Assessment of Progress/Goals.      END OF SESSION:  PT End of Session - 06/17/22 1341     Visit Number 10    Number of Visits 25    Date for PT Re-Evaluation 08/06/22    Authorization Type Medicare & BCBS    Progress Note Due on Visit 10    PT Start Time 1341    PT Stop Time 1428    PT Time Calculation (min) 47 min    Equipment Utilized During Treatment Gait belt    Activity Tolerance Patient tolerated treatment well;Patient limited by fatigue    Behavior During Therapy WFL for tasks assessed/performed              Past Medical History:  Diagnosis Date   Diabetes ([place])    Diabetes mellitus without complication ([place])    End stage renal disease ([place])    HTN (hypertension)    Hyperlipidemia    Hypertension    Past Surgical History:  Procedure Laterality Date   ABDOMINAL HYSTERECTOMY     fibroids/complete hysterec 2000   APPENDECTOMY     CATARACT EXTRACTION Bilateral    COLONOSCOPY N/A 03/12/2016   Procedure: COLONOSCOPY;  Surgeon: Daneil Dolin, MD;  [facility]: [facility];  Service: Endoscopy;  Laterality: N/A;  9:30 am   POLYPECTOMY  03/12/2016   Procedure: POLYPECTOMY;  Surgeon: Daneil Dolin, MD;  Location: [facility];  Service: Endoscopy;;  colon   YAG LASER APPLICATION Left 0000000   Procedure: YAG LASER APPLICATION;  Surgeon: Williams Che, MD;  Location: [facility];  Service: Ophthalmology;  Laterality: Left;   Patient Active Problem List   Diagnosis Date Noted   Immunosuppressed status ([place]) 04/18/2022   Hx of kidney transplant 10/14/2021   Hx of AKA (above knee amputation), left ([place]) 10/14/2021   Diabetic peripheral neuropathy  associated with type 2 diabetes mellitus ([place]) 10/14/2021   Uncontrolled type 2 diabetes mellitus with hyperglycemia, with long-term current use of insulin ([place]) 08/27/2017   Hyperlipidemia associated with type 2 diabetes mellitus ([place]) 03/18/2017   Chronic renal disease, stage V ([place]) 08/20/2015   Personal history of noncompliance with medical treatment, presenting hazards to health 08/20/2015   Proteinuria 07/04/2014   Uncontrolled type 2 diabetes mellitus with ESRD (end-stage renal disease) 05/21/2014   Hyperlipidemia 05/21/2014   HTN (hypertension) 05/21/2014    ONSET DATE: 04/17/2022 MD referral to PT  REFERRING DIAG:  89.612 (ICD-10-CM) - Hx of AKA (above knee amputation), left (HCC)  R53.1 (ICD-10-CM) - Weakness    THERAPY DIAG:  Muscle weakness (generalized)  Other abnormalities of gait and mobility  Decreased functional mobility and endurance  Unsteadiness on feet  Abnormal posture  Rationale for Evaluation and Treatment: Rehabilitation  SUBJECTIVE:   SUBJECTIVE STATEMENT: No changes from last session.    PERTINENT HISTORY: DM2, neuropathy, HTN, CHF, renal CA,CKD st 5,  ESRD, kidney transplant 08/30/2017, Thoracotomy with lobectomy 02/24/2018,   PAIN:  Are you having pain? Yes: NPRS scale: today 0/10 and no pain since last PT session.  Pain location: left residual limb posteriorly Pain description: spasm, "toothache" Aggravating factors:  prosthesis wear Relieving factors: remove prosthesis including liner within 5-10 min.  PRECAUTIONS: Other: no BP LUE  WEIGHT BEARING RESTRICTIONS: No  FALLS: Has patient fallen in last 6 months? Yes. Number of falls 4 denies injuries, shakes but not low blood sugar.   LIVING ENVIRONMENT: Lives with: lives with their family sister just moved into pt's home Lives in: House  single level Home Access: Stairs to enter Home layout: One level Stairs: Yes: External: 4 steps; on left going up Has following equipment at home:  Single point cane, Environmental consultant - 2 wheeled, Environmental consultant - 4 wheeled, Wheelchair (manual), [employer], and Grab bars  PLOF: Independent, Independent with household mobility without device, and Independent with community mobility without device prosthesis only for community activities  PATIENT GOALS:   walk in community. Be active without fear of falling.   OBJECTIVE:  COGNITION: Overall cognitive status: Within functional limits for tasks assessed   SENSATION: WFL  POSTURE: rounded shoulders, forward head, flexed trunk , and weight shift right  LOWER EXTREMITY ROM:  ROM P:passive  A:active Right eval Left eval  Hip flexion    Hip extension    Hip abduction    Hip adduction    Hip internal rotation    Hip external rotation    Knee flexion  WFL  Knee extension  WFL  Ankle dorsiflexion    Ankle plantarflexion    Ankle inversion    Ankle eversion     (Blank rows = not tested)  LOWER EXTREMITY MMT:  MMT Right eval Left eval  Hip flexion 5/5 4/5  Hip extension 4/5 4-/5  Hip abduction 4/5 4-/5  Hip adduction    Hip internal rotation    Hip external rotation    Knee flexion 5/5 4/5  Knee extension 5/5 4/5  Ankle dorsiflexion    Ankle plantarflexion    Ankle inversion    Ankle eversion    (Blank rows = not tested)  TRANSFERS: Sit to stand: Modified independence requires UE assist on armrests Stand to sit: Modified independence requires UE assist on armrests  GAIT: 06/17/2022 6 Minute Walk Test with cane & TTA prosthesis:  Pre HR 75 SpO2 97% no dyspnea  Post HR 95 SpO2 93% dyspnea 7/10 with significant ShOB for 3 min post activity Pt amb 545' in 4 min 30 sec with 3 standing 15 sec rests required.  She was unable to complete the 6 min time.    06/10/2022: gait velocity without AD 3.03 ft/sec  Eval / 05/12/2022:  Gait pattern: step through pattern, decreased arm swing- Left, decreased step length- Right, decreased stance time- Left, decreased hip/knee flexion- Left, Left  hip hike, antalgic, and abducted- Left Distance walked: 120' Assistive device utilized: arrived using rollator walker;  assessed with cane & no device. Level of assistance: cane CGA and no device Min A Gait velocity: 2.51 ft/sec with cane &  2.84 ft/sec no device (MinA)  FUNCTIONAL TESTs:  06/10/2022: 4-square step test with cane 17.98sec with CGA and without cane 20.13 sec with minA  06/08/2022: TUG   with cane standard 12.12 sec & cognitive 12.66 sec  Without device standard 9.69 sec & cognitive 11.00 sec Berg Balance 45/56  Eval / 05/12/2022:  Timed up and go (TUG):  standard 14.40sec & Cognitive 18.69sec   Both with cane. Berg Balance Scale: 38/56    CARDIOVASCULAR RESPONSE: Functional activity: Berg & Gait assessment Pre-activity vitals: HR: 76 SpO2: 94% Post-activity vitals: HR: 84 SpO2: 95%   CURRENT PROSTHETIC WEAR ASSESSMENT:  Patient is independent with: skin check, residual limb care,  care of non-amputated limb, prosthetic cleaning, correct ply sock adjustment, and proper wear schedule/adjustment Patient is dependent with: donning & managing edema / limb volume Donning prosthesis: SBA Doffing prosthesis: Modified independence Prosthetic wear tolerance: 10-12 hours of 15-16 awake hours (62-80% of awake hours) limited by pain Prosthetic weight bearing tolerance: 5 minutes limited by fatigue Edema: pitting Residual limb condition: no open areas, no hair growth, normal color, temperature & moisture, cylindrical shape Prosthetic description: silicon gel liner, suction pin sleeve with flexible inner socket supspension, total contact socket, dynamic response foot K code/activity level with prosthetic use: Level 3    TODAY'S TREATMENT:                                                                                                                             DATE:  06/17/2022: Gait Training: See 6 MWT under objective PT reviewed results with pt   Therapeutic  Exercise: PT reviewed short, medium & long walking program.  See pt instruction section. Recumbent stepper set up for [employer] Weight machines PT recommending initially 1 set of 15 reps for each and increasing to 2 sets of 15 as tolerated.  Patient to use 3-6 lower extremity machines, 4-6 upper extremity machines and 1-2 trunk machines.  Patient verbalizes understanding     06/15/2022: Therapeutic Exercise: Nustep seat 11 level 7 with BLEs & BUEs for 5 min. Recumbent bike - PT demo & verbal cues on technique including seat adjustment.  Seat 7 pt rode bike for 3 min.  Leg press BLEs 100# 15 reps X 2, single LEs 50# 10 reps 2 sets ea LE Seated row 20# 15 reps Seated chest press 20# 15 reps 2 sets Knee ext machine BLEs 15# 15 reps PT cued on set up for gym Seated knee flex machine BLEs 25# 15 reps PT cued on set up for gym PT recommending 4-6 UE machines, 1-2 trunk & 4-6 LE machines.  Work opposing muscle groups even if weight is different.    Therapeutic Activities:  Sit to /from stand 18" chair 3 reps with BUEs, 3 reps without UE assist;   PT demo & verbal cues on moving chair under & out from under table. Pt return demo understanding.  PT demo & verbal cues on sitting with feet supported on ground or stool. Pt verbalized understanding.    06/10/2022: Prosthetic Training with Left TTA prosthesis: See gait velocity & 4-square step test in objective data Sit to / from stand from 18" chairs without armrests using seat, 19" rolling stool (close to wall for safety), 19" rolling desk chair (close to wall for safety) and corner of 18" chair.  Pt demo & verbal cues on technique with goal minimal UE push / only touching to ensure chair is under her.  Pt reported issues with std toilet at family's home, new desk chair and recliner.  PT verbal & simulated demo how to get recliner foot board down engaging abdominals.  PT used seated knee flexion machine 25# to simulate motion.  Pt verbalized  better understanding.   Pt plans to fly to [place] this summer with questions on travel.  PT verbally educated on flying with prosthesis. Pt verbalized understanding.   Pt able to pick cane up off floor with PT cues on technique but no physical assist.  Pt amb 100' X 3 in clinic without AD with supervision.     HOME EXERCISE PROGRAM: Access Code: [email or web address] URL: [email or web address] Date: 05/18/2022 Prepared by: Jamey Reas  Exercises - Tandem Walking with Counter Support  - 1 x daily - 5-7 x weekly - 1 sets - 3-5 reps - Backward Walking with Counter Support  - 1 x daily - 5-7 x weekly - 1 sets - 3-5 reps - [place] with Counter Support  - 1 x daily - 5-7 x weekly - 1 sets - 3-5 reps - Standing Tandem Balance with Counter Support  - 1 x daily - 5-7 x weekly - 1 sets - 1 reps - 50 seconds hold - Standing Rolling Forward & Back, Side to Side and Circles  - 1 x daily - 5-7 x weekly - 1 sets - 10 reps  Increasing your activity level is important.  Short distances which is walking from one room to another. Work to increase frequency back to prior level.  Long distance is your highest tolerance for you. Walk until you feel you must rest. Back or leg pain or general fatigue are indicators to maximum tolerance. Monitor by distance or time. Try to walk your BEST distance 1 times per day. You should see this increase over time.   Medium distances are entering & exiting your home or community with limited distances. Start with 4 medium walks which is one outing to one location and increase number of tolerated amounts per day.  ASSESSMENT: CLINICAL IMPRESSION:  Pt. has attended 10 visits since evaluation.  See objective data for 6-minute walk test.  Pt. has made gains towards improving her mobility with prosthesis for community accessibility.  Patient has less limb pain with activities.  Pt. may continue to benefit from skilled PT services to continue progression towards reaching  established goals and reduced risk of falls due to presentation.   OBJECTIVE IMPAIRMENTS: Abnormal gait, cardiopulmonary status limiting activity, decreased activity tolerance, decreased balance, decreased endurance, decreased knowledge of use of DME, decreased mobility, difficulty walking, decreased strength, increased edema, postural dysfunction, prosthetic dependency , and pain.   ACTIVITY LIMITATIONS: carrying, lifting, sitting, standing, squatting, stairs, transfers, and locomotion level  PARTICIPATION LIMITATIONS: meal prep, driving, shopping, and community activity  PERSONAL FACTORS: Fitness, Time since onset of injury/illness/exacerbation, and 3+ comorbidities: see PMH  are also affecting patient's functional outcome.   REHAB POTENTIAL: Good  CLINICAL DECISION MAKING: Evolving/moderate complexity  EVALUATION COMPLEXITY: Moderate   GOALS: Goals reviewed with patient? Yes  SHORT TERM GOALS: Target date: 07/09/2022  Patient verbalizes proper adjusting ply socks with limb volume changes. Baseline: SEE OBJECTIVE DATA Goal status: SET 06/10/2022 2.  Patient tolerates prosthesis >90% awake hrs /day without skin issues or limb pain </= 2/10 after standing. Baseline: SEE OBJECTIVE DATA Goal status:  SET 06/10/2022  3. 4-square test with cane <15 sec safely Baseline: SEE OBJECTIVE DATA Goal status:  SET 06/10/2022  4. Patient ambulates 300' with cane & prosthesis with supervision. Baseline: SEE OBJECTIVE DATA Goal status:  SET 06/10/2022  5. Patient demo & verbalizes understanding of updated HEP Baseline: SEE OBJECTIVE DATA Goal status:  SET 06/10/2022  LONG TERM GOALS: Target date: 08/06/2022  Patient demonstrates & verbalized understanding of prosthetic care to enable safe utilization of prosthesis. Baseline: SEE OBJECTIVE DATA Goal status:  Ongoing 05/18/2022  Patient tolerates prosthesis wear >90% of awake hours without skin or limb pain issues. Baseline: SEE OBJECTIVE  DATA Goal status: Ongoing 05/18/2022  Merrilee Jansky Balance >46/56 Baseline: SEE OBJECTIVE DATA Goal status:  Ongoing 05/18/2022  Patient ambulates >500' with prosthesis and cane or less independently Baseline: SEE OBJECTIVE DATA Goal status: Ongoing 05/18/2022  Patient negotiates ramps, curbs & stairs with single rail with prosthesis and cane or less independently. Baseline: SEE OBJECTIVE DATA Goal status:  Ongoing 05/18/2022  Patient verbalizes & demonstrates understanding of how to properly use fitness equipment to return to gym. Baseline: SEE OBJECTIVE DATA Goal status:  Ongoing 05/18/2022  PLAN:  PT FREQUENCY: 2x/week  PT DURATION: 12 weeks  PLANNED INTERVENTIONS: Therapeutic exercises, Therapeutic activity, Neuromuscular re-education, Balance training, Gait training, Patient/Family education, Self Care, Stair training, Vestibular training, Canalith repositioning, Prosthetic training, DME instructions, and physical performance testing  PLAN FOR NEXT SESSION: Check how HEP including walking program is going, continue instruction in fitness plan,  continue balance activities.    Jamey Reas, PT, DPT 06/17/2022, 3:39 PM

## 2022-06-23 ENCOUNTER — Encounter: Payer: Self-pay | Admitting: Physical Therapy

## 2022-06-23 ENCOUNTER — Ambulatory Visit (INDEPENDENT_AMBULATORY_CARE_PROVIDER_SITE_OTHER): Payer: Medicare Other | Admitting: Physical Therapy

## 2022-06-23 DIAGNOSIS — R2681 Unsteadiness on feet: Secondary | ICD-10-CM

## 2022-06-23 DIAGNOSIS — M6281 Muscle weakness (generalized): Secondary | ICD-10-CM | POA: Diagnosis not present

## 2022-06-23 DIAGNOSIS — R2689 Other abnormalities of gait and mobility: Secondary | ICD-10-CM

## 2022-06-23 DIAGNOSIS — Z7409 Other reduced mobility: Secondary | ICD-10-CM | POA: Diagnosis not present

## 2022-06-23 DIAGNOSIS — R293 Abnormal posture: Secondary | ICD-10-CM

## 2022-06-23 NOTE — Patient Instructions (Addendum)
Walking Program 3-5 days/week. Use rollator walker to enable resting spot and light upper extremity support. Progress to next level when current level is not challenge or difficult.  Start walking 5 min, rest 5 min for 2 sets Add 3rd set. So you are walking total of 15 min.  Decrease rest to 4 min. Walk 5 min for 3 sets Walk 6 min, rest 4 min for 3 sets. Decrease rest to 3 min. Walking 6 min for 3 sets Walk 7 min, rest 3 min, 3 sets Walk 10 min, rest 5 min 2 sets Walk 11 min, rest 5 min, 2 sets Walk 12 min, rest 5 min, 2 sets Walk 13 min, rest 5 min, 2 sets Walk 14 min, rest 5 min, 2 sets Walk 15 min, rest 5 min, 2 sets Walk 15 min, rest 4 min, 2 sets Walk 15 min, rest 3 min, 2 sets Walk 15 min, rest 2 min, 2 sets Walk 15 min, rest 1 min, 2 sets Walk 30 min

## 2022-06-23 NOTE — Therapy (Signed)
OUTPATIENT PHYSICAL THERAPY TREATMENT  Patient Name: Shelley [name] MRN: [id number] DOB:02/25/1951, 72 y.o., female Today's Date: 06/23/2022  PCP: Kathyrn Drown, MD REFERRING PROVIDER: Kathyrn Drown, MD     END OF SESSION:  PT End of Session - 06/23/22 1341     Visit Number 11    Number of Visits 25    Date for PT Re-Evaluation 08/06/22    Authorization Type Medicare & BCBS    Progress Note Due on Visit 44    PT Start Time 1341    PT Stop Time 1432    PT Time Calculation (min) 51 min    Equipment Utilized During Treatment Gait belt    Activity Tolerance Patient tolerated treatment well;Patient limited by fatigue    Behavior During Therapy WFL for tasks assessed/performed              Past Medical History:  Diagnosis Date   Diabetes    Diabetes mellitus without complication    End stage renal disease    HTN (hypertension)    Hyperlipidemia    Hypertension    Past Surgical History:  Procedure Laterality Date   ABDOMINAL HYSTERECTOMY     fibroids/complete hysterec 2000   APPENDECTOMY     CATARACT EXTRACTION Bilateral    COLONOSCOPY N/A 03/12/2016   Procedure: COLONOSCOPY;  Surgeon: Daneil Dolin, MD;  Location: [facility];  Service: Endoscopy;  Laterality: N/A;  9:30 am   POLYPECTOMY  03/12/2016   Procedure: POLYPECTOMY;  Surgeon: Daneil Dolin, MD;  Location: [facility];  Service: Endoscopy;;  colon   YAG LASER APPLICATION Left 0000000   Procedure: YAG LASER APPLICATION;  Surgeon: Williams Che, MD;  Location: [facility];  Service: Ophthalmology;  Laterality: Left;   Patient Active Problem List   Diagnosis Date Noted   Immunosuppressed status 04/18/2022   Hx of kidney transplant 10/14/2021   Hx of AKA (above knee amputation), left 10/14/2021   Diabetic peripheral neuropathy associated with type 2 diabetes mellitus 10/14/2021   Uncontrolled type 2 diabetes mellitus with hyperglycemia, with long-term current use of insulin 08/27/2017    Hyperlipidemia associated with type 2 diabetes mellitus 03/18/2017   Chronic renal disease, stage V 08/20/2015   Personal history of noncompliance with medical treatment, presenting hazards to health 08/20/2015   Proteinuria 07/04/2014   Uncontrolled type 2 diabetes mellitus with ESRD (end-stage renal disease) 05/21/2014   Hyperlipidemia 05/21/2014   HTN (hypertension) 05/21/2014    ONSET DATE: 04/17/2022 MD referral to PT  REFERRING DIAG:  89.612 (ICD-10-CM) - Hx of AKA (above knee amputation), left (HCC)  R53.1 (ICD-10-CM) - Weakness    THERAPY DIAG:  Muscle weakness (generalized)  Other abnormalities of gait and mobility  Decreased functional mobility and endurance  Unsteadiness on feet  Abnormal posture  Rationale for Evaluation and Treatment: Rehabilitation  SUBJECTIVE:   SUBJECTIVE STATEMENT:   She checked height of closet rod that she is having issues getting hangers on/ off.  When she reaches up her fingers just touch the rod.    PERTINENT HISTORY: DM2, neuropathy, HTN, CHF, renal CA,CKD st 5,  ESRD, kidney transplant 08/30/2017, Thoracotomy with lobectomy 02/24/2018,   PAIN:  Are you having pain? Yes: NPRS scale: today 0/10 and no pain since last PT session.  Pain location: left residual limb posteriorly Pain description: spasm, "toothache" Aggravating factors:  prosthesis wear Relieving factors: remove prosthesis including liner within 5-10 min.   PRECAUTIONS: Other: no BP LUE  WEIGHT BEARING  RESTRICTIONS: No  FALLS: Has patient fallen in last 6 months? Yes. Number of falls 4 denies injuries, shakes but not low blood sugar.   LIVING ENVIRONMENT: Lives with: lives with their family sister just moved into pt's home Lives in: House  single level Home Access: Stairs to enter Home layout: One level Stairs: Yes: External: 4 steps; on left going up Has following equipment at home: Single point cane, Environmental consultant - 2 wheeled, Environmental consultant - 4 wheeled, Wheelchair (manual),  [employer], and Grab bars  PLOF: Independent, Independent with household mobility without device, and Independent with community mobility without device prosthesis only for community activities  PATIENT GOALS:   walk in community. Be active without fear of falling.   OBJECTIVE:  COGNITION: Overall cognitive status: Within functional limits for tasks assessed   SENSATION: WFL  POSTURE: rounded shoulders, forward head, flexed trunk , and weight shift right  LOWER EXTREMITY ROM:  ROM P:passive  A:active Right eval Left eval  Hip flexion    Hip extension    Hip abduction    Hip adduction    Hip internal rotation    Hip external rotation    Knee flexion  WFL  Knee extension  WFL  Ankle dorsiflexion    Ankle plantarflexion    Ankle inversion    Ankle eversion     (Blank rows = not tested)  LOWER EXTREMITY MMT:  MMT Right eval Left eval  Hip flexion 5/5 4/5  Hip extension 4/5 4-/5  Hip abduction 4/5 4-/5  Hip adduction    Hip internal rotation    Hip external rotation    Knee flexion 5/5 4/5  Knee extension 5/5 4/5  Ankle dorsiflexion    Ankle plantarflexion    Ankle inversion    Ankle eversion    (Blank rows = not tested)  TRANSFERS: Sit to stand: Modified independence requires UE assist on armrests Stand to sit: Modified independence requires UE assist on armrests  GAIT: 06/17/2022 6 Minute Walk Test with cane & TTA prosthesis:  Pre HR 75 SpO2 97% no dyspnea  Post HR 95 SpO2 93% dyspnea 7/10 with significant ShOB for 3 min post activity Pt amb 545' in 4 min 30 sec with 3 standing 15 sec rests required.  She was unable to complete the 6 min time.    06/10/2022: gait velocity without AD 3.03 ft/sec  Eval / 05/12/2022:  Gait pattern: step through pattern, decreased arm swing- Left, decreased step length- Right, decreased stance time- Left, decreased hip/knee flexion- Left, Left hip hike, antalgic, and abducted- Left Distance walked: 120' Assistive device  utilized: arrived using rollator walker;  assessed with cane & no device. Level of assistance: cane CGA and no device Min A Gait velocity: 2.51 ft/sec with cane &  2.84 ft/sec no device (MinA)  FUNCTIONAL TESTs:  06/10/2022: 4-square step test with cane 17.98sec with CGA and without cane 20.13 sec with minA  06/08/2022: TUG   with cane standard 12.12 sec & cognitive 12.66 sec  Without device standard 9.69 sec & cognitive 11.00 sec Berg Balance 45/56  Eval / 05/12/2022:  Timed up and go (TUG):  standard 14.40sec & Cognitive 18.69sec   Both with cane. Berg Balance Scale: 38/56    CARDIOVASCULAR RESPONSE: Functional activity: Berg & Gait assessment Pre-activity vitals: HR: 76 SpO2: 94% Post-activity vitals: HR: 84 SpO2: 95%   CURRENT PROSTHETIC WEAR ASSESSMENT:  Patient is independent with: skin check, residual limb care, care of non-amputated limb, prosthetic cleaning, correct ply  sock adjustment, and proper wear schedule/adjustment Patient is dependent with: donning & managing edema / limb volume Donning prosthesis: SBA Doffing prosthesis: Modified independence Prosthetic wear tolerance: 10-12 hours of 15-16 awake hours (62-80% of awake hours) limited by pain Prosthetic weight bearing tolerance: 5 minutes limited by fatigue Edema: pitting Residual limb condition: no open areas, no hair growth, normal color, temperature & moisture, cylindrical shape Prosthetic description: silicon gel liner, suction pin sleeve with flexible inner socket supspension, total contact socket, dynamic response foot K code/activity level with prosthetic use: Level 3    TODAY'S TREATMENT:                                                                                                                             DATE:  06/23/2022: Therapeutic Exercise: Pt amb with rollator walker for 5 min for 615'.  Seated rest for 5 min. For 2 sets. SpO2 96% HR 95 at end of 2nd walk. After 4 min of rest SpO2 98% HR  80 PT educated pt verbally & in writing on components of fitness program to include flexibility, strength, endurance & balance. PT discussed classes at [facility] that she can take on Zoom if she registers with them. She is also going to investigate [employer] for classes & gym. She will take pictures of gym to discuss with PT. Pt also expressed some depression with no intent to harm herself.  PT recommended looking into some of socialization opportunities at the senior centers. Upon PT questioning, she reported that she would share if she had plans to harm herself & seek help. Pt verbalized understanding.   Pt educated in walking program: Walking Program 3-5 days/week. Use rollator walker to enable resting spot and light upper extremity support. Progress to next level when current level is not challenge or difficult.  Start walking 5 min, rest 5 min for 2 sets Add 3rd set. So you are walking total of 15 min.  Decrease rest to 4 min. Walk 5 min for 3 sets Walk 6 min, rest 4 min for 3 sets. Decrease rest to 3 min. Walking 6 min for 3 sets Walk 7 min, rest 3 min, 3 sets Walk 10 min, rest 5 min 2 sets Walk 11 min, rest 5 min, 2 sets Walk 12 min, rest 5 min, 2 sets Walk 13 min, rest 5 min, 2 sets Walk 14 min, rest 5 min, 2 sets Walk 15 min, rest 5 min, 2 sets Walk 15 min, rest 4 min, 2 sets Walk 15 min, rest 3 min, 2 sets Walk 15 min, rest 2 min, 2 sets Walk 15 min, rest 1 min, 2 sets Walk 30 min   06/17/2022: Gait Training: See 6 MWT under objective PT reviewed results with pt   Therapeutic Exercise: PT reviewed short, medium & long walking program.  See pt instruction section. Recumbent stepper set up for [employer] Weight machines PT recommending initially 1 set of 15  reps for each and increasing to 2 sets of 15 as tolerated.  Patient to use 3-6 lower extremity machines, 4-6 upper extremity machines and 1-2 trunk machines.  Patient verbalizes  understanding     06/15/2022: Therapeutic Exercise: Nustep seat 11 level 7 with BLEs & BUEs for 5 min. Recumbent bike - PT demo & verbal cues on technique including seat adjustment.  Seat 7 pt rode bike for 3 min.  Leg press BLEs 100# 15 reps X 2, single LEs 50# 10 reps 2 sets ea LE Seated row 20# 15 reps Seated chest press 20# 15 reps 2 sets Knee ext machine BLEs 15# 15 reps PT cued on set up for gym Seated knee flex machine BLEs 25# 15 reps PT cued on set up for gym PT recommending 4-6 UE machines, 1-2 trunk & 4-6 LE machines.  Work opposing muscle groups even if weight is different.    Therapeutic Activities:  Sit to /from stand 18" chair 3 reps with BUEs, 3 reps without UE assist;   PT demo & verbal cues on moving chair under & out from under table. Pt return demo understanding.  PT demo & verbal cues on sitting with feet supported on ground or stool. Pt verbalized understanding.    HOME EXERCISE PROGRAM: Access Code: [email or web address] URL: [email or web address] Date: 05/18/2022 Prepared by: Jamey Reas  Exercises - Tandem Walking with Counter Support  - 1 x daily - 5-7 x weekly - 1 sets - 3-5 reps - Backward Walking with Counter Support  - 1 x daily - 5-7 x weekly - 1 sets - 3-5 reps - [place] with Counter Support  - 1 x daily - 5-7 x weekly - 1 sets - 3-5 reps - Standing Tandem Balance with Counter Support  - 1 x daily - 5-7 x weekly - 1 sets - 1 reps - 50 seconds hold - Standing Rolling Forward & Back, Side to Side and Circles  - 1 x daily - 5-7 x weekly - 1 sets - 10 reps  Increasing your activity level is important.  Short distances which is walking from one room to another. Work to increase frequency back to prior level.  Long distance is your highest tolerance for you. Walk until you feel you must rest. Back or leg pain or general fatigue are indicators to maximum tolerance. Monitor by distance or time. Try to walk your BEST distance 1 times per day. You should  see this increase over time.   Medium distances are entering & exiting your home or community with limited distances. Start with 4 medium walks which is one outing to one location and increase number of tolerated amounts per day.  ASSESSMENT: CLINICAL IMPRESSION:  PT spent session educating patient on fitness plan including a progressive walking program.  She verbalized understanding.   OBJECTIVE IMPAIRMENTS: Abnormal gait, cardiopulmonary status limiting activity, decreased activity tolerance, decreased balance, decreased endurance, decreased knowledge of use of DME, decreased mobility, difficulty walking, decreased strength, increased edema, postural dysfunction, prosthetic dependency , and pain.   ACTIVITY LIMITATIONS: carrying, lifting, sitting, standing, squatting, stairs, transfers, and locomotion level  PARTICIPATION LIMITATIONS: meal prep, driving, shopping, and community activity  PERSONAL FACTORS: Fitness, Time since onset of injury/illness/exacerbation, and 3+ comorbidities: see PMH  are also affecting patient's functional outcome.   REHAB POTENTIAL: Good  CLINICAL DECISION MAKING: Evolving/moderate complexity  EVALUATION COMPLEXITY: Moderate   GOALS: Goals reviewed with patient? Yes  SHORT TERM GOALS: Target date: 07/09/2022  Patient verbalizes proper adjusting ply  socks with limb volume changes. Baseline: SEE OBJECTIVE DATA Goal status: SET 06/10/2022 2.  Patient tolerates prosthesis >90% awake hrs /day without skin issues or limb pain </= 2/10 after standing. Baseline: SEE OBJECTIVE DATA Goal status:  SET 06/10/2022  3. 4-square test with cane <15 sec safely Baseline: SEE OBJECTIVE DATA Goal status:  SET 06/10/2022  4. Patient ambulates 300' with cane & prosthesis with supervision. Baseline: SEE OBJECTIVE DATA Goal status:  SET 06/10/2022  5. Patient demo & verbalizes understanding of updated HEP Baseline: SEE OBJECTIVE DATA Goal status:  SET 06/10/2022  LONG  TERM GOALS: Target date: 08/06/2022  Patient demonstrates & verbalized understanding of prosthetic care to enable safe utilization of prosthesis. Baseline: SEE OBJECTIVE DATA Goal status:  Ongoing 05/18/2022  Patient tolerates prosthesis wear >90% of awake hours without skin or limb pain issues. Baseline: SEE OBJECTIVE DATA Goal status: Ongoing 05/18/2022  Merrilee Jansky Balance >46/56 Baseline: SEE OBJECTIVE DATA Goal status:  Ongoing 05/18/2022  Patient ambulates >500' with prosthesis and cane or less independently Baseline: SEE OBJECTIVE DATA Goal status: Ongoing 05/18/2022  Patient negotiates ramps, curbs & stairs with single rail with prosthesis and cane or less independently. Baseline: SEE OBJECTIVE DATA Goal status:  Ongoing 05/18/2022  Patient verbalizes & demonstrates understanding of how to properly use fitness equipment to return to gym. Baseline: SEE OBJECTIVE DATA Goal status:  Ongoing 05/18/2022  PLAN:  PT FREQUENCY: 2x/week  PT DURATION: 12 weeks  PLANNED INTERVENTIONS: Therapeutic exercises, Therapeutic activity, Neuromuscular re-education, Balance training, Gait training, Patient/Family education, Self Care, Stair training, Vestibular training, Canalith repositioning, Prosthetic training, DME instructions, and physical performance testing  PLAN FOR NEXT SESSION: continue balance activities and therapeutic exercise, work towards [place], PT, DPT 06/23/2022, 3:01 PM

## 2022-06-24 ENCOUNTER — Ambulatory Visit (INDEPENDENT_AMBULATORY_CARE_PROVIDER_SITE_OTHER): Payer: Medicare Other | Admitting: Physical Therapy

## 2022-06-24 ENCOUNTER — Encounter: Payer: Self-pay | Admitting: Physical Therapy

## 2022-06-24 DIAGNOSIS — R293 Abnormal posture: Secondary | ICD-10-CM | POA: Diagnosis not present

## 2022-06-24 DIAGNOSIS — M6281 Muscle weakness (generalized): Secondary | ICD-10-CM | POA: Diagnosis not present

## 2022-06-24 DIAGNOSIS — R2681 Unsteadiness on feet: Secondary | ICD-10-CM

## 2022-06-24 DIAGNOSIS — R2689 Other abnormalities of gait and mobility: Secondary | ICD-10-CM

## 2022-06-24 DIAGNOSIS — Z7409 Other reduced mobility: Secondary | ICD-10-CM | POA: Diagnosis not present

## 2022-06-24 NOTE — Therapy (Signed)
OUTPATIENT PHYSICAL THERAPY TREATMENT  Patient Name: Shelley [name] MRN: [id number] DOB:Nov 03, 1950, 72 y.o., female Today's Date: 06/24/2022  PCP: Kathyrn Drown, MD REFERRING PROVIDER: Kathyrn Drown, MD     END OF SESSION:  PT End of Session - 06/24/22 1342     Visit Number 12    Number of Visits 25    Date for PT Re-Evaluation 08/06/22    Authorization Type Medicare & BCBS    Progress Note Due on Visit 20    PT Start Time 1345    PT Stop Time 1425    PT Time Calculation (min) 40 min    Equipment Utilized During Treatment Gait belt    Activity Tolerance Patient tolerated treatment well;Patient limited by fatigue    Behavior During Therapy WFL for tasks assessed/performed              Past Medical History:  Diagnosis Date   Diabetes    Diabetes mellitus without complication    End stage renal disease    HTN (hypertension)    Hyperlipidemia    Hypertension    Past Surgical History:  Procedure Laterality Date   ABDOMINAL HYSTERECTOMY     fibroids/complete hysterec 2000   APPENDECTOMY     CATARACT EXTRACTION Bilateral    COLONOSCOPY N/A 03/12/2016   Procedure: COLONOSCOPY;  Surgeon: Daneil Dolin, MD;  Location: [facility];  Service: Endoscopy;  Laterality: N/A;  9:30 am   POLYPECTOMY  03/12/2016   Procedure: POLYPECTOMY;  Surgeon: Daneil Dolin, MD;  Location: [facility];  Service: Endoscopy;;  colon   YAG LASER APPLICATION Left 0000000   Procedure: YAG LASER APPLICATION;  Surgeon: Williams Che, MD;  Location: [facility];  Service: Ophthalmology;  Laterality: Left;   Patient Active Problem List   Diagnosis Date Noted   Immunosuppressed status 04/18/2022   Hx of kidney transplant 10/14/2021   Hx of AKA (above knee amputation), left 10/14/2021   Diabetic peripheral neuropathy associated with type 2 diabetes mellitus 10/14/2021   Uncontrolled type 2 diabetes mellitus with hyperglycemia, with long-term current use of insulin 08/27/2017    Hyperlipidemia associated with type 2 diabetes mellitus 03/18/2017   Chronic renal disease, stage V 08/20/2015   Personal history of noncompliance with medical treatment, presenting hazards to health 08/20/2015   Proteinuria 07/04/2014   Uncontrolled type 2 diabetes mellitus with ESRD (end-stage renal disease) 05/21/2014   Hyperlipidemia 05/21/2014   HTN (hypertension) 05/21/2014    ONSET DATE: 04/17/2022 MD referral to PT  REFERRING DIAG:  89.612 (ICD-10-CM) - Hx of AKA (above knee amputation), left (HCC)  R53.1 (ICD-10-CM) - Weakness    THERAPY DIAG:  Muscle weakness (generalized)  Other abnormalities of gait and mobility  Decreased functional mobility and endurance  Abnormal posture  Unsteadiness on feet  Rationale for Evaluation and Treatment: Rehabilitation  SUBJECTIVE:   SUBJECTIVE STATEMENT:  she was tired and some RLE calf soreness after walking program of 2 sets 5 min with rollator walker last session.   PERTINENT HISTORY: DM2, neuropathy, HTN, CHF, renal CA,CKD st 5,  ESRD, kidney transplant 08/30/2017, Thoracotomy with lobectomy 02/24/2018,   PAIN:  Are you having pain? Yes: NPRS scale: today 0/10 and no pain since last PT session.  Pain location: left residual limb posteriorly Pain description: spasm, "toothache" Aggravating factors:  prosthesis wear Relieving factors: remove prosthesis including liner within 5-10 min.   PRECAUTIONS: Other: no BP LUE  WEIGHT BEARING RESTRICTIONS: No  FALLS: Has patient fallen  in last 6 months? Yes. Number of falls 4 denies injuries, shakes but not low blood sugar.   LIVING ENVIRONMENT: Lives with: lives with their family sister just moved into pt's home Lives in: House  single level Home Access: Stairs to enter Home layout: One level Stairs: Yes: External: 4 steps; on left going up Has following equipment at home: Single point cane, Environmental consultant - 2 wheeled, Environmental consultant - 4 wheeled, Wheelchair (manual), [employer], and Grab  bars  PLOF: Independent, Independent with household mobility without device, and Independent with community mobility without device prosthesis only for community activities  PATIENT GOALS:   walk in community. Be active without fear of falling.   OBJECTIVE:  COGNITION: Overall cognitive status: Within functional limits for tasks assessed   SENSATION: WFL  POSTURE: rounded shoulders, forward head, flexed trunk , and weight shift right  LOWER EXTREMITY ROM:  ROM P:passive  A:active Right eval Left eval  Hip flexion    Hip extension    Hip abduction    Hip adduction    Hip internal rotation    Hip external rotation    Knee flexion  WFL  Knee extension  WFL  Ankle dorsiflexion    Ankle plantarflexion    Ankle inversion    Ankle eversion     (Blank rows = not tested)  LOWER EXTREMITY MMT:  MMT Right eval Left eval  Hip flexion 5/5 4/5  Hip extension 4/5 4-/5  Hip abduction 4/5 4-/5  Hip adduction    Hip internal rotation    Hip external rotation    Knee flexion 5/5 4/5  Knee extension 5/5 4/5  Ankle dorsiflexion    Ankle plantarflexion    Ankle inversion    Ankle eversion    (Blank rows = not tested)  TRANSFERS: Sit to stand: Modified independence requires UE assist on armrests Stand to sit: Modified independence requires UE assist on armrests  GAIT: 06/17/2022 6 Minute Walk Test with cane & TTA prosthesis:  Pre HR 75 SpO2 97% no dyspnea  Post HR 95 SpO2 93% dyspnea 7/10 with significant ShOB for 3 min post activity Pt amb 545' in 4 min 30 sec with 3 standing 15 sec rests required.  She was unable to complete the 6 min time.    06/10/2022: gait velocity without AD 3.03 ft/sec  Eval / 05/12/2022:  Gait pattern: step through pattern, decreased arm swing- Left, decreased step length- Right, decreased stance time- Left, decreased hip/knee flexion- Left, Left hip hike, antalgic, and abducted- Left Distance walked: 120' Assistive device utilized: arrived using  rollator walker;  assessed with cane & no device. Level of assistance: cane CGA and no device Min A Gait velocity: 2.51 ft/sec with cane &  2.84 ft/sec no device (MinA)  FUNCTIONAL TESTs:  06/10/2022: 4-square step test with cane 17.98sec with CGA and without cane 20.13 sec with minA  06/08/2022: TUG   with cane standard 12.12 sec & cognitive 12.66 sec  Without device standard 9.69 sec & cognitive 11.00 sec Berg Balance 45/56  Eval / 05/12/2022:  Timed up and go (TUG):  standard 14.40sec & Cognitive 18.69sec   Both with cane. Berg Balance Scale: 38/56    CARDIOVASCULAR RESPONSE: Functional activity: Berg & Gait assessment Pre-activity vitals: HR: 76 SpO2: 94% Post-activity vitals: HR: 84 SpO2: 95%   CURRENT PROSTHETIC WEAR ASSESSMENT:  Patient is independent with: skin check, residual limb care, care of non-amputated limb, prosthetic cleaning, correct ply sock adjustment, and proper wear schedule/adjustment Patient  is dependent with: donning & managing edema / limb volume Donning prosthesis: SBA Doffing prosthesis: Modified independence Prosthetic wear tolerance: 10-12 hours of 15-16 awake hours (62-80% of awake hours) limited by pain Prosthetic weight bearing tolerance: 5 minutes limited by fatigue Edema: pitting Residual limb condition: no open areas, no hair growth, normal color, temperature & moisture, cylindrical shape Prosthetic description: silicon gel liner, suction pin sleeve with flexible inner socket supspension, total contact socket, dynamic response foot K code/activity level with prosthetic use: Level 3    TODAY'S TREATMENT:                                                                                                                             DATE:  06/24/2022: Therapeutic Exercise: NuStep seat 10 level 6 with BLEs/BUEs for 5 min 2 sets. During rest between sets: hamstring stretch seated with strap 30 sec hold 2 reps ea LE & gastroc stretch with forefoot on  2" block 30 sec hold 2 reps ea LE. Pt verbalized understanding of these stretches.  Standing with cane & chair back support: red theraband kicks 10 reps ea motion abd, flex, add & ext BLEs.  Side stepping with push / pull motions 1st lap with counter support and 2nd/3rd lap with hands near counter.    06/23/2022: Therapeutic Exercise: Pt amb with rollator walker for 5 min for 615'.  Seated rest for 5 min. For 2 sets. SpO2 96% HR 95 at end of 2nd walk. After 4 min of rest SpO2 98% HR 80 PT educated pt verbally & in writing on components of fitness program to include flexibility, strength, endurance & balance. PT discussed classes at [facility] that she can take on Zoom if she registers with them. She is also going to investigate [employer] for classes & gym. She will take pictures of gym to discuss with PT. Pt also expressed some depression with no intent to harm herself.  PT recommended looking into some of socialization opportunities at the senior centers. Upon PT questioning, she reported that she would share if she had plans to harm herself & seek help. Pt verbalized understanding.   Pt educated in walking program: Walking Program 3-5 days/week. Use rollator walker to enable resting spot and light upper extremity support. Progress to next level when current level is not challenge or difficult.  Start walking 5 min, rest 5 min for 2 sets Add 3rd set. So you are walking total of 15 min.  Decrease rest to 4 min. Walk 5 min for 3 sets Walk 6 min, rest 4 min for 3 sets. Decrease rest to 3 min. Walking 6 min for 3 sets Walk 7 min, rest 3 min, 3 sets Walk 10 min, rest 5 min 2 sets Walk 11 min, rest 5 min, 2 sets Walk 12 min, rest 5 min, 2 sets Walk 13 min, rest 5 min, 2 sets Walk 14 min, rest 5 min, 2 sets Walk 15  min, rest 5 min, 2 sets Walk 15 min, rest 4 min, 2 sets Walk 15 min, rest 3 min, 2 sets Walk 15 min, rest 2 min, 2 sets Walk 15 min, rest 1 min, 2  sets Walk 30 min   06/17/2022: Gait Training: See 6 MWT under objective PT reviewed results with pt   Therapeutic Exercise: PT reviewed short, medium & long walking program.  See pt instruction section. Recumbent stepper set up for [employer] Weight machines PT recommending initially 1 set of 15 reps for each and increasing to 2 sets of 15 as tolerated.  Patient to use 3-6 lower extremity machines, 4-6 upper extremity machines and 1-2 trunk machines.  Patient verbalizes understanding   HOME EXERCISE PROGRAM: Access Code: [email or web address] URL: [email or web address] Date: 05/18/2022 Prepared by: Jamey Reas  Exercises - Tandem Walking with Counter Support  - 1 x daily - 5-7 x weekly - 1 sets - 3-5 reps - Backward Walking with Counter Support  - 1 x daily - 5-7 x weekly - 1 sets - 3-5 reps - [place] with Counter Support  - 1 x daily - 5-7 x weekly - 1 sets - 3-5 reps - Standing Tandem Balance with Counter Support  - 1 x daily - 5-7 x weekly - 1 sets - 1 reps - 50 seconds hold - Standing Rolling Forward & Back, Side to Side and Circles  - 1 x daily - 5-7 x weekly - 1 sets - 10 reps  Increasing your activity level is important.  Short distances which is walking from one room to another. Work to increase frequency back to prior level.  Long distance is your highest tolerance for you. Walk until you feel you must rest. Back or leg pain or general fatigue are indicators to maximum tolerance. Monitor by distance or time. Try to walk your BEST distance 1 times per day. You should see this increase over time.   Medium distances are entering & exiting your home or community with limited distances. Start with 4 medium walks which is one outing to one location and increase number of tolerated amounts per day.  ASSESSMENT: CLINICAL IMPRESSION: Pt educated in using recumbent stepper as option for endurance.  Pt continues to be challenged by 2 sets of 5 min with endurance activities.   PT introduced standing theraband kicks but pt will need more work in PT prior to trying outside of PT.    OBJECTIVE IMPAIRMENTS: Abnormal gait, cardiopulmonary status limiting activity, decreased activity tolerance, decreased balance, decreased endurance, decreased knowledge of use of DME, decreased mobility, difficulty walking, decreased strength, increased edema, postural dysfunction, prosthetic dependency , and pain.   ACTIVITY LIMITATIONS: carrying, lifting, sitting, standing, squatting, stairs, transfers, and locomotion level  PARTICIPATION LIMITATIONS: meal prep, driving, shopping, and community activity  PERSONAL FACTORS: Fitness, Time since onset of injury/illness/exacerbation, and 3+ comorbidities: see PMH  are also affecting patient's functional outcome.   REHAB POTENTIAL: Good  CLINICAL DECISION MAKING: Evolving/moderate complexity  EVALUATION COMPLEXITY: Moderate   GOALS: Goals reviewed with patient? Yes  SHORT TERM GOALS: Target date: 07/09/2022  Patient verbalizes proper adjusting ply socks with limb volume changes. Baseline: SEE OBJECTIVE DATA Goal status: SET 06/10/2022 2.  Patient tolerates prosthesis >90% awake hrs /day without skin issues or limb pain </= 2/10 after standing. Baseline: SEE OBJECTIVE DATA Goal status:  SET 06/10/2022  3. 4-square test with cane <15 sec safely Baseline: SEE OBJECTIVE DATA Goal status:  SET 06/10/2022  4. Patient ambulates 300' with  cane & prosthesis with supervision. Baseline: SEE OBJECTIVE DATA Goal status:  SET 06/10/2022  5. Patient demo & verbalizes understanding of updated HEP Baseline: SEE OBJECTIVE DATA Goal status:  SET 06/10/2022  LONG TERM GOALS: Target date: 08/06/2022  Patient demonstrates & verbalized understanding of prosthetic care to enable safe utilization of prosthesis. Baseline: SEE OBJECTIVE DATA Goal status:  Ongoing 05/18/2022  Patient tolerates prosthesis wear >90% of awake hours without skin or limb pain  issues. Baseline: SEE OBJECTIVE DATA Goal status: Ongoing 05/18/2022  Merrilee Jansky Balance >46/56 Baseline: SEE OBJECTIVE DATA Goal status:  Ongoing 05/18/2022  Patient ambulates >500' with prosthesis and cane or less independently Baseline: SEE OBJECTIVE DATA Goal status: Ongoing 05/18/2022  Patient negotiates ramps, curbs & stairs with single rail with prosthesis and cane or less independently. Baseline: SEE OBJECTIVE DATA Goal status:  Ongoing 05/18/2022  Patient verbalizes & demonstrates understanding of how to properly use fitness equipment to return to gym. Baseline: SEE OBJECTIVE DATA Goal status:  Ongoing 05/18/2022  PLAN:  PT FREQUENCY: 2x/week  PT DURATION: 12 weeks  PLANNED INTERVENTIONS: Therapeutic exercises, Therapeutic activity, Neuromuscular re-education, Balance training, Gait training, Patient/Family education, Self Care, Stair training, Vestibular training, Canalith repositioning, Prosthetic training, DME instructions, and physical performance testing  PLAN FOR NEXT SESSION: check if she found info for [facility] senior centers, review standing kicks,  balance activities near counter and therapeutic exercise, work towards [employer], PT, DPT 06/24/2022, 2:28 PM

## 2022-06-25 ENCOUNTER — Other Ambulatory Visit: Payer: Self-pay | Admitting: Family Medicine

## 2022-06-30 ENCOUNTER — Encounter: Payer: Self-pay | Admitting: Physical Therapy

## 2022-06-30 ENCOUNTER — Ambulatory Visit (INDEPENDENT_AMBULATORY_CARE_PROVIDER_SITE_OTHER): Payer: Medicare Other | Admitting: Physical Therapy

## 2022-06-30 DIAGNOSIS — R293 Abnormal posture: Secondary | ICD-10-CM | POA: Diagnosis not present

## 2022-06-30 DIAGNOSIS — R2689 Other abnormalities of gait and mobility: Secondary | ICD-10-CM | POA: Diagnosis not present

## 2022-06-30 DIAGNOSIS — Z7409 Other reduced mobility: Secondary | ICD-10-CM

## 2022-06-30 DIAGNOSIS — R2681 Unsteadiness on feet: Secondary | ICD-10-CM

## 2022-06-30 DIAGNOSIS — M6281 Muscle weakness (generalized): Secondary | ICD-10-CM

## 2022-06-30 NOTE — Therapy (Signed)
OUTPATIENT PHYSICAL THERAPY TREATMENT  Patient Name: Shelley [name] MRN: [id number] DOB:December 23, 1950, 72 y.o., female Today's Date: 06/30/2022  PCP: Babs Sciara, MD REFERRING PROVIDER: Babs Sciara, MD     END OF SESSION:  PT End of Session - 06/30/22 1348     Visit Number 13    Number of Visits 25    Date for PT Re-Evaluation 08/06/22    Authorization Type Medicare & BCBS    Progress Note Due on Visit 20    PT Start Time 1345    PT Stop Time 1432    PT Time Calculation (min) 47 min    Equipment Utilized During Treatment Gait belt    Activity Tolerance Patient tolerated treatment well;Patient limited by fatigue    Behavior During Therapy WFL for tasks assessed/performed              Past Medical History:  Diagnosis Date   Diabetes    Diabetes mellitus without complication    End stage renal disease    HTN (hypertension)    Hyperlipidemia    Hypertension    Past Surgical History:  Procedure Laterality Date   ABDOMINAL HYSTERECTOMY     fibroids/complete hysterec 2000   APPENDECTOMY     CATARACT EXTRACTION Bilateral    COLONOSCOPY N/A 03/12/2016   Procedure: COLONOSCOPY;  Surgeon: Corbin Ade, MD;  Location: [facility];  Service: Endoscopy;  Laterality: N/A;  9:30 am   POLYPECTOMY  03/12/2016   Procedure: POLYPECTOMY;  Surgeon: Corbin Ade, MD;  Location: [facility];  Service: Endoscopy;;  colon   YAG LASER APPLICATION Left 01/22/2014   Procedure: YAG LASER APPLICATION;  Surgeon: Susa Simmonds, MD;  Location: [facility];  Service: Ophthalmology;  Laterality: Left;   Patient Active Problem List   Diagnosis Date Noted   Immunosuppressed status 04/18/2022   Hx of kidney transplant 10/14/2021   Hx of AKA (above knee amputation), left 10/14/2021   Diabetic peripheral neuropathy associated with type 2 diabetes mellitus 10/14/2021   Uncontrolled type 2 diabetes mellitus with hyperglycemia, with long-term current use of insulin 08/27/2017    Hyperlipidemia associated with type 2 diabetes mellitus 03/18/2017   Chronic renal disease, stage V 08/20/2015   Personal history of noncompliance with medical treatment, presenting hazards to health 08/20/2015   Proteinuria 07/04/2014   Uncontrolled type 2 diabetes mellitus with ESRD (end-stage renal disease) 05/21/2014   Hyperlipidemia 05/21/2014   HTN (hypertension) 05/21/2014    ONSET DATE: 04/17/2022 MD referral to PT  REFERRING DIAG:  89.612 (ICD-10-CM) - Hx of AKA (above knee amputation), left (HCC)  R53.1 (ICD-10-CM) - Weakness    THERAPY DIAG:  Muscle weakness (generalized)  Other abnormalities of gait and mobility  Decreased functional mobility and endurance  Abnormal posture  Unsteadiness on feet  Rationale for Evaluation and Treatment: Rehabilitation  SUBJECTIVE:   SUBJECTIVE STATEMENT:   She plans to join [employer].    PERTINENT HISTORY: DM2, neuropathy, HTN, CHF, renal CA,CKD st 5,  ESRD, kidney transplant 08/30/2017, Thoracotomy with lobectomy 02/24/2018,   PAIN:  Are you having pain? Yes: NPRS scale: today 0/10 and no pain since last PT session.  Pain location: left residual limb posteriorly Pain description: spasm, "toothache" Aggravating factors:  prosthesis wear Relieving factors: remove prosthesis including liner within 5-10 min.   PRECAUTIONS: Other: no BP LUE  WEIGHT BEARING RESTRICTIONS: No  FALLS: Has patient fallen in last 6 months? Yes. Number of falls 4 denies injuries, shakes but  not low blood sugar.   LIVING ENVIRONMENT: Lives with: lives with their family sister just moved into pt's home Lives in: House  single level Home Access: Stairs to enter Home layout: One level Stairs: Yes: External: 4 steps; on left going up Has following equipment at home: Single point cane, Environmental consultantWalker - 2 wheeled, Environmental consultantWalker - 4 wheeled, Wheelchair (manual), Graybar ElectricShower bench, and Grab bars  PLOF: Independent, Independent with household mobility without device, and  Independent with community mobility without device prosthesis only for community activities  PATIENT GOALS:   walk in community. Be active without fear of falling.   OBJECTIVE:  COGNITION: Overall cognitive status: Within functional limits for tasks assessed   SENSATION: WFL  POSTURE: rounded shoulders, forward head, flexed trunk , and weight shift right  LOWER EXTREMITY ROM:  ROM P:passive  A:active Right eval Left eval  Hip flexion    Hip extension    Hip abduction    Hip adduction    Hip internal rotation    Hip external rotation    Knee flexion  WFL  Knee extension  WFL  Ankle dorsiflexion    Ankle plantarflexion    Ankle inversion    Ankle eversion     (Blank rows = not tested)  LOWER EXTREMITY MMT:  MMT Right eval Left eval  Hip flexion 5/5 4/5  Hip extension 4/5 4-/5  Hip abduction 4/5 4-/5  Hip adduction    Hip internal rotation    Hip external rotation    Knee flexion 5/5 4/5  Knee extension 5/5 4/5  Ankle dorsiflexion    Ankle plantarflexion    Ankle inversion    Ankle eversion    (Blank rows = not tested)  TRANSFERS: Sit to stand: Modified independence requires UE assist on armrests Stand to sit: Modified independence requires UE assist on armrests  GAIT: 06/17/2022 6 Minute Walk Test with cane & TTA prosthesis:  Pre HR 75 SpO2 97% no dyspnea  Post HR 95 SpO2 93% dyspnea 7/10 with significant ShOB for 3 min post activity Pt amb 545' in 4 min 30 sec with 3 standing 15 sec rests required.  She was unable to complete the 6 min time.    06/10/2022: gait velocity without AD 3.03 ft/sec  Eval / 05/12/2022:  Gait pattern: step through pattern, decreased arm swing- Left, decreased step length- Right, decreased stance time- Left, decreased hip/knee flexion- Left, Left hip hike, antalgic, and abducted- Left Distance walked: 120' Assistive device utilized: arrived using rollator walker;  assessed with cane & no device. Level of assistance: cane CGA  and no device Min A Gait velocity: 2.51 ft/sec with cane &  2.84 ft/sec no device (MinA)  FUNCTIONAL TESTs:  06/10/2022: 4-square step test with cane 17.98sec with CGA and without cane 20.13 sec with minA  06/08/2022: TUG   with cane standard 12.12 sec & cognitive 12.66 sec  Without device standard 9.69 sec & cognitive 11.00 sec Berg Balance 45/56  Eval / 05/12/2022:  Timed up and go (TUG):  standard 14.40sec & Cognitive 18.69sec   Both with cane. Berg Balance Scale: 38/56    CARDIOVASCULAR RESPONSE: Functional activity: Berg & Gait assessment Pre-activity vitals: HR: 76 SpO2: 94% Post-activity vitals: HR: 84 SpO2: 95%   CURRENT PROSTHETIC WEAR ASSESSMENT:  Patient is independent with: skin check, residual limb care, care of non-amputated limb, prosthetic cleaning, correct ply sock adjustment, and proper wear schedule/adjustment Patient is dependent with: donning & managing edema / limb volume Donning prosthesis: SBA  Doffing prosthesis: Modified independence Prosthetic wear tolerance: 10-12 hours of 15-16 awake hours (62-80% of awake hours) limited by pain Prosthetic weight bearing tolerance: 5 minutes limited by fatigue Edema: pitting Residual limb condition: no open areas, no hair growth, normal color, temperature & moisture, cylindrical shape Prosthetic description: silicon gel liner, suction pin sleeve with flexible inner socket supspension, total contact socket, dynamic response foot K code/activity level with prosthetic use: Level 3    TODAY'S TREATMENT:                                                                                                                             DATE:  06/30/2022: Therapeutic Exercise: Standing with cane & chair back support: red theraband kicks 10 reps ea motion abd, flex, add & ext BLEs. Can increase difficulty by shortening band, adding reps or decrease chair back support.  Stretches 30 sec hold 2 reps ea LE: seated hamstring with strap,  standing gastroc with 2" block, seated trunk rotation & supine quad. PT added to HEP with verbal, demo & HO cues. Pt verbalized & return demo understanding.    06/24/2022: Therapeutic Exercise: NuStep seat 10 level 6 with BLEs/BUEs for 5 min 2 sets. During rest between sets: hamstring stretch seated with strap 30 sec hold 2 reps ea LE & gastroc stretch with forefoot on 2" block 30 sec hold 2 reps ea LE. Pt verbalized understanding of these stretches.  Standing with cane & chair back support: red theraband kicks 10 reps ea motion abd, flex, add & ext BLEs.  Side stepping with push / pull motions 1st lap with counter support and 2nd/3rd lap with hands near counter.    06/23/2022: Therapeutic Exercise: Pt amb with rollator walker for 5 min for 615'.  Seated rest for 5 min. For 2 sets. SpO2 96% HR 95 at end of 2nd walk. After 4 min of rest SpO2 98% HR 80 PT educated pt verbally & in writing on components of fitness program to include flexibility, strength, endurance & balance. PT discussed classes at [facility] that she can take on Zoom if she registers with them. She is also going to investigate [employer] for classes & gym. She will take pictures of gym to discuss with PT. Pt also expressed some depression with no intent to harm herself.  PT recommended looking into some of socialization opportunities at the senior centers. Upon PT questioning, she reported that she would share if she had plans to harm herself & seek help. Pt verbalized understanding.   Pt educated in walking program: Walking Program 3-5 days/week. Use rollator walker to enable resting spot and light upper extremity support. Progress to next level when current level is not challenge or difficult. See HEP below    HOME EXERCISE PROGRAM: Access Code: [email or web address] URL: [email or web address] Date: 06/30/2022 Prepared by: Vladimir Faster  Exercises - Tandem Walking with Counter Support  - 1 x  daily - 5-7 x weekly -  1 sets - 3-5 reps - Backward Walking with Counter Support  - 1 x daily - 5-7 x weekly - 1 sets - 3-5 reps - Carioca with Counter Support  - 1 x daily - 5-7 x weekly - 1 sets - 3-5 reps - Standing Tandem Balance with Counter Support  - 1 x daily - 5-7 x weekly - 1 sets - 1 reps - 50 seconds hold - Standing Rolling Forward & Back, Side to Side and Circles  - 1 x daily - 5-7 x weekly - 1 sets - 10 reps - Standing Hip Flexion with Resistance (Mirrored)  - 1 x daily - 7 x weekly - 1 sets - 10 reps - Standing Hip Extension with Resistance  - 1 x daily - 7 x weekly - 1 sets - 10 reps - Standing Hip Adduction with Resistance (Mirrored)  - 1 x daily - 7 x weekly - 1 sets - 10 reps - Standing Hip Abduction with Theraband Resistance  - 1 x daily - 7 x weekly - 1 sets - 10 reps - Seated Hamstring Stretch with Strap  - 1 x daily - 7 x weekly - 1 sets - 2-3 reps - 30 seconds hold - standing calf stretch with forefoot on small step or brick  - 1 x daily - 7 x weekly - 1 sets - 2-3 reps - 30 seconds hold - Seated Trunk Rotation Stretch  - 1 x daily - 7 x weekly - 1 sets - 2-3 reps - 30 seconds hold - Supine Quadriceps Stretch with Strap on Table  - 1 x daily - 7 x weekly - 1 sets - 2-3 reps - 30 seconds hold  Increasing your activity level is important.  Short distances which is walking from one room to another. Work to increase frequency back to prior level.  Medium distances are entering & exiting your home or community with limited distances. Start with 4 medium walks which is one outing to one location and increase number of tolerated amounts per day.  Long walk is walking program Walking Program 3-5 days/week. Use rollator walker to enable resting spot and light upper extremity support. Progress to next level when current level is not challenge or difficult.  Start walking 5 min, rest 5 min for 2 sets Add 3rd set. So you are walking total of 15 min.  Decrease rest to 4 min. Walk  5 min for 3 sets Walk 6 min, rest 4 min for 3 sets. Decrease rest to 3 min. Walking 6 min for 3 sets Walk 7 min, rest 3 min, 3 sets Walk 10 min, rest 5 min 2 sets Walk 11 min, rest 5 min, 2 sets Walk 12 min, rest 5 min, 2 sets Walk 13 min, rest 5 min, 2 sets Walk 14 min, rest 5 min, 2 sets Walk 15 min, rest 5 min, 2 sets Walk 15 min, rest 4 min, 2 sets Walk 15 min, rest 3 min, 2 sets Walk 15 min, rest 2 min, 2 sets Walk 15 min, rest 1 min, 2 sets Walk 30 min   ASSESSMENT: CLINICAL IMPRESSION: PT updated HEP to include standing theraband exercises & stretches.  She appears to understand.  PT explained that her fitness program now includes key components of flexibility, strength, balance & endurance.  She verbalizes understanding.   OBJECTIVE IMPAIRMENTS: Abnormal gait, cardiopulmonary status limiting activity, decreased activity tolerance, decreased balance, decreased endurance, decreased knowledge of use of DME, decreased mobility, difficulty walking, decreased  strength, increased edema, postural dysfunction, prosthetic dependency , and pain.   ACTIVITY LIMITATIONS: carrying, lifting, sitting, standing, squatting, stairs, transfers, and locomotion level  PARTICIPATION LIMITATIONS: meal prep, driving, shopping, and community activity  PERSONAL FACTORS: Fitness, Time since onset of injury/illness/exacerbation, and 3+ comorbidities: see PMH  are also affecting patient's functional outcome.   REHAB POTENTIAL: Good  CLINICAL DECISION MAKING: Evolving/moderate complexity  EVALUATION COMPLEXITY: Moderate   GOALS: Goals reviewed with patient? Yes  SHORT TERM GOALS: Target date: 07/09/2022  Patient verbalizes proper adjusting ply socks with limb volume changes. Baseline: SEE OBJECTIVE DATA Goal status: ongoing 06/30/2022 2.  Patient tolerates prosthesis >90% awake hrs /day without skin issues or limb pain </= 2/10 after standing. Baseline: SEE OBJECTIVE DATA Goal status:   ongoing  06/30/2022  3. 4-square test with cane <15 sec safely Baseline: SEE OBJECTIVE DATA Goal status:   ongoing 06/30/2022  4. Patient ambulates 300' with cane & prosthesis with supervision. Baseline: SEE OBJECTIVE DATA Goal status:   ongoing 06/30/2022  5. Patient demo & verbalizes understanding of updated HEP Baseline: SEE OBJECTIVE DATA Goal status:   ongoing 06/30/2022  LONG TERM GOALS: Target date: 08/06/2022  Patient demonstrates & verbalized understanding of prosthetic care to enable safe utilization of prosthesis. Baseline: SEE OBJECTIVE DATA Goal status:   ongoing 06/30/2022  Patient tolerates prosthesis wear >90% of awake hours without skin or limb pain issues. Baseline: SEE OBJECTIVE DATA Goal status:  ongoing 06/30/2022  Berg Balance >46/56 Baseline: SEE OBJECTIVE DATA Goal status:   ongoing 06/30/2022  Patient ambulates >500' with prosthesis and cane or less independently Baseline: SEE OBJECTIVE DATA Goal status:  ongoing 06/30/2022  Patient negotiates ramps, curbs & stairs with single rail with prosthesis and cane or less independently. Baseline: SEE OBJECTIVE DATA Goal status:  ongoing 06/30/2022  Patient verbalizes & demonstrates understanding of how to properly use fitness equipment to return to gym. Baseline: SEE OBJECTIVE DATA Goal status:   ongoing 06/30/2022  PLAN:  PT FREQUENCY: 2x/week  PT DURATION: 12 weeks  PLANNED INTERVENTIONS: Therapeutic exercises, Therapeutic activity, Neuromuscular re-education, Balance training, Gait training, Patient/Family education, Self Care, Stair training, Vestibular training, Canalith repositioning, Prosthetic training, DME instructions, and physical performance testing  PLAN FOR NEXT SESSION: check STGs,  balance activities & gait.    Vladimir Faster, PT, DPT 06/30/2022, 2:39 PM

## 2022-07-02 ENCOUNTER — Ambulatory Visit (INDEPENDENT_AMBULATORY_CARE_PROVIDER_SITE_OTHER): Payer: Medicare Other | Admitting: Physical Therapy

## 2022-07-02 ENCOUNTER — Encounter: Payer: Self-pay | Admitting: Physical Therapy

## 2022-07-02 DIAGNOSIS — R2689 Other abnormalities of gait and mobility: Secondary | ICD-10-CM

## 2022-07-02 DIAGNOSIS — M6281 Muscle weakness (generalized): Secondary | ICD-10-CM | POA: Diagnosis not present

## 2022-07-02 DIAGNOSIS — R293 Abnormal posture: Secondary | ICD-10-CM | POA: Diagnosis not present

## 2022-07-02 DIAGNOSIS — Z7409 Other reduced mobility: Secondary | ICD-10-CM

## 2022-07-02 NOTE — Therapy (Signed)
OUTPATIENT PHYSICAL THERAPY TREATMENT  Patient Name: Shelley [name] MRN: [id number] DOB:[birth year], 72 y.o., female Today's Date: 07/02/2022  PCP: Babs Sciara, MD REFERRING PROVIDER: Babs Sciara, MD     END OF SESSION:  PT End of Session - 07/02/22 1300     Visit Number 14    Number of Visits 25    Date for PT Re-Evaluation 08/06/22    Authorization Type Medicare & BCBS    Progress Note Due on Visit 20    PT Start Time 1300    PT Stop Time 1342    PT Time Calculation (min) 42 min    Equipment Utilized During Treatment Gait belt    Activity Tolerance Patient tolerated treatment well;Patient limited by fatigue    Behavior During Therapy WFL for tasks assessed/performed               Past Medical History:  Diagnosis Date   Diabetes    Diabetes mellitus without complication    End stage renal disease    HTN (hypertension)    Hyperlipidemia    Hypertension    Past Surgical History:  Procedure Laterality Date   ABDOMINAL HYSTERECTOMY     fibroids/complete hysterec 2000   APPENDECTOMY     CATARACT EXTRACTION Bilateral    COLONOSCOPY N/A 03/12/2016   Procedure: COLONOSCOPY;  Surgeon: Corbin Ade, MD;  Location: [facility];  Service: Endoscopy;  Laterality: N/A;  9:30 am   POLYPECTOMY  03/12/2016   Procedure: POLYPECTOMY;  Surgeon: Corbin Ade, MD;  Location: [facility];  Service: Endoscopy;;  colon   YAG LASER APPLICATION Left 01/22/2014   Procedure: YAG LASER APPLICATION;  Surgeon: Susa Simmonds, MD;  Location: [facility];  Service: Ophthalmology;  Laterality: Left;   Patient Active Problem List   Diagnosis Date Noted   Immunosuppressed status 04/18/2022   Hx of kidney transplant 10/14/2021   Hx of AKA (above knee amputation), left 10/14/2021   Diabetic peripheral neuropathy associated with type 2 diabetes mellitus 10/14/2021   Uncontrolled type 2 diabetes mellitus with hyperglycemia, with long-term current use of insulin 08/27/2017    Hyperlipidemia associated with type 2 diabetes mellitus 03/18/2017   Chronic renal disease, stage V 08/20/2015   Personal history of noncompliance with medical treatment, presenting hazards to health 08/20/2015   Proteinuria 07/04/2014   Uncontrolled type 2 diabetes mellitus with ESRD (end-stage renal disease) 05/21/2014   Hyperlipidemia 05/21/2014   HTN (hypertension) 05/21/2014    ONSET DATE: 04/17/2022 MD referral to PT  REFERRING DIAG:  89.612 (ICD-10-CM) - Hx of AKA (above knee amputation), left (HCC)  R53.1 (ICD-10-CM) - Weakness    THERAPY DIAG:  Muscle weakness (generalized)  Other abnormalities of gait and mobility  Decreased functional mobility and endurance  Abnormal posture  Rationale for Evaluation and Treatment: Rehabilitation  SUBJECTIVE:   SUBJECTIVE STATEMENT  She is joining [employer] today or tomorrow.  She has kidney MD appt next week. She has been doing new exercises.   PERTINENT HISTORY: DM2, neuropathy, HTN, CHF, renal CA,CKD st 5,  ESRD, kidney transplant 08/30/2017, Thoracotomy with lobectomy 02/24/2018,   PAIN:  Are you having pain? Yes: NPRS scale: today 0/10 and no pain since last PT session.  Pain location: left residual limb posteriorly Pain description: spasm, "toothache" Aggravating factors:  prosthesis wear Relieving factors: remove prosthesis including liner within 5-10 min.   PRECAUTIONS: Other: no BP LUE  WEIGHT BEARING RESTRICTIONS: No  FALLS: Has patient fallen in last  6 months? Yes. Number of falls 4 denies injuries, shakes but not low blood sugar.   LIVING ENVIRONMENT: Lives with: lives with their family sister just moved into pt's home Lives in: House  single level Home Access: Stairs to enter Home layout: One level Stairs: Yes: External: 4 steps; on left going up Has following equipment at home: Single point cane, Environmental consultant - 2 wheeled, Environmental consultant - 4 wheeled, Wheelchair (manual), [employer], and Grab bars  PLOF: Independent,  Independent with household mobility without device, and Independent with community mobility without device prosthesis only for community activities  PATIENT GOALS:   walk in community. Be active without fear of falling.   OBJECTIVE:  COGNITION: Overall cognitive status: Within functional limits for tasks assessed   SENSATION: WFL  POSTURE: rounded shoulders, forward head, flexed trunk , and weight shift right  LOWER EXTREMITY ROM:  ROM P:passive  A:active Right eval Left eval  Hip flexion    Hip extension    Hip abduction    Hip adduction    Hip internal rotation    Hip external rotation    Knee flexion  WFL  Knee extension  WFL  Ankle dorsiflexion    Ankle plantarflexion    Ankle inversion    Ankle eversion     (Blank rows = not tested)  LOWER EXTREMITY MMT:  MMT Right eval Left eval  Hip flexion 5/5 4/5  Hip extension 4/5 4-/5  Hip abduction 4/5 4-/5  Hip adduction    Hip internal rotation    Hip external rotation    Knee flexion 5/5 4/5  Knee extension 5/5 4/5  Ankle dorsiflexion    Ankle plantarflexion    Ankle inversion    Ankle eversion    (Blank rows = not tested)  TRANSFERS: Sit to stand: Modified independence requires UE assist on armrests Stand to sit: Modified independence requires UE assist on armrests  GAIT: 06/17/2022 6 Minute Walk Test with cane & TTA prosthesis:  Pre HR 75 SpO2 97% no dyspnea  Post HR 95 SpO2 93% dyspnea 7/10 with significant ShOB for 3 min post activity Pt amb 545' in 4 min 30 sec with 3 standing 15 sec rests required.  She was unable to complete the 6 min time.    06/10/2022: gait velocity without AD 3.03 ft/sec  Eval / 05/12/2022:  Gait pattern: step through pattern, decreased arm swing- Left, decreased step length- Right, decreased stance time- Left, decreased hip/knee flexion- Left, Left hip hike, antalgic, and abducted- Left Distance walked: 120' Assistive device utilized: arrived using rollator walker;  assessed  with cane & no device. Level of assistance: cane CGA and no device Min A Gait velocity: 2.51 ft/sec with cane &  2.84 ft/sec no device (MinA)  FUNCTIONAL TESTs:  06/10/2022: 4-square step test with cane 17.98sec with CGA and without cane 20.13 sec with minA  06/08/2022: TUG   with cane standard 12.12 sec & cognitive 12.66 sec  Without device standard 9.69 sec & cognitive 11.00 sec Berg Balance 45/56  Eval / 05/12/2022:  Timed up and go (TUG):  standard 14.40sec & Cognitive 18.69sec   Both with cane. Berg Balance Scale: 38/56    CARDIOVASCULAR RESPONSE: Functional activity: Berg & Gait assessment Pre-activity vitals: HR: 76 SpO2: 94% Post-activity vitals: HR: 84 SpO2: 95%   CURRENT PROSTHETIC WEAR ASSESSMENT:  Patient is independent with: skin check, residual limb care, care of non-amputated limb, prosthetic cleaning, correct ply sock adjustment, and proper wear schedule/adjustment Patient is dependent  with: donning & managing edema / limb volume Donning prosthesis: SBA Doffing prosthesis: Modified independence Prosthetic wear tolerance: 10-12 hours of 15-16 awake hours (62-80% of awake hours) limited by pain Prosthetic weight bearing tolerance: 5 minutes limited by fatigue Edema: pitting Residual limb condition: no open areas, no hair growth, normal color, temperature & moisture, cylindrical shape Prosthetic description: silicon gel liner, suction pin sleeve with flexible inner socket supspension, total contact socket, dynamic response foot K code/activity level with prosthetic use: Level 3    TODAY'S TREATMENT:                                                                                                                             DATE:  07/02/2022: Prosthetic Training with TTA prosthesis: Pt amb without device 160' X 2 working on looking forward (not staring at floor) including weaving around obstacles.  Pt verbalized difference in upper body posture & balance.  Picking  up objects from floor with PT cues (demo & verbal) on technique.  Pt performed 6 reps with supervision.  PT demo & verbal cues on neg curb & ramps without device. Pt performed with supervision.  PT recommended short / household without device, medium / basic community initiate carrying cane & use cane prn for distance, surface & ramps/curbs,  long walk with rollator / walking program. Pt verbalized understanding.    06/30/2022: Therapeutic Exercise: Standing with cane & chair back support: red theraband kicks 10 reps ea motion abd, flex, add & ext BLEs. Can increase difficulty by shortening band, adding reps or decrease chair back support.  Stretches 30 sec hold 2 reps ea LE: seated hamstring with strap, standing gastroc with 2" block, seated trunk rotation & supine quad. PT added to HEP with verbal, demo & HO cues. Pt verbalized & return demo understanding.    06/24/2022: Therapeutic Exercise: NuStep seat 10 level 6 with BLEs/BUEs for 5 min 2 sets. During rest between sets: hamstring stretch seated with strap 30 sec hold 2 reps ea LE & gastroc stretch with forefoot on 2" block 30 sec hold 2 reps ea LE. Pt verbalized understanding of these stretches.  Standing with cane & chair back support: red theraband kicks 10 reps ea motion abd, flex, add & ext BLEs.  Side stepping with push / pull motions 1st lap with counter support and 2nd/3rd lap with hands near counter.     HOME EXERCISE PROGRAM: Access Code: [email or web address] URL: [email or web address] Date: 06/30/2022 Prepared by: Vladimir Faster  Exercises - Tandem Walking with Counter Support  - 1 x daily - 5-7 x weekly - 1 sets - 3-5 reps - Backward Walking with Counter Support  - 1 x daily - 5-7 x weekly - 1 sets - 3-5 reps - [place] with Counter Support  - 1 x daily - 5-7 x weekly - 1 sets - 3-5 reps - Standing Tandem Balance with Counter Support  - 1 x daily - 5-7 x weekly - 1  sets - 1 reps - 50 seconds hold - Standing Rolling Forward  & Back, Side to Side and Circles  - 1 x daily - 5-7 x weekly - 1 sets - 10 reps - Standing Hip Flexion with Resistance (Mirrored)  - 1 x daily - 7 x weekly - 1 sets - 10 reps - Standing Hip Extension with Resistance  - 1 x daily - 7 x weekly - 1 sets - 10 reps - Standing Hip Adduction with Resistance (Mirrored)  - 1 x daily - 7 x weekly - 1 sets - 10 reps - Standing Hip Abduction with Theraband Resistance  - 1 x daily - 7 x weekly - 1 sets - 10 reps - Seated Hamstring Stretch with Strap  - 1 x daily - 7 x weekly - 1 sets - 2-3 reps - 30 seconds hold - standing calf stretch with forefoot on small step or brick  - 1 x daily - 7 x weekly - 1 sets - 2-3 reps - 30 seconds hold - Seated Trunk Rotation Stretch  - 1 x daily - 7 x weekly - 1 sets - 2-3 reps - 30 seconds hold - Supine Quadriceps Stretch with Strap on Table  - 1 x daily - 7 x weekly - 1 sets - 2-3 reps - 30 seconds hold  Increasing your activity level is important.  Short distances which is walking from one room to another. Work to increase frequency back to prior level.  Medium distances are entering & exiting your home or community with limited distances. Start with 4 medium walks which is one outing to one location and increase number of tolerated amounts per day.  Long walk is walking program Walking Program 3-5 days/week. Use rollator walker to enable resting spot and light upper extremity support. Progress to next level when current level is not challenge or difficult.  Start walking 5 min, rest 5 min for 2 sets Add 3rd set. So you are walking total of 15 min.  Decrease rest to 4 min. Walk 5 min for 3 sets Walk 6 min, rest 4 min for 3 sets. Decrease rest to 3 min. Walking 6 min for 3 sets Walk 7 min, rest 3 min, 3 sets Walk 10 min, rest 5 min 2 sets Walk 11 min, rest 5 min, 2 sets Walk 12 min, rest 5 min, 2 sets Walk 13 min, rest 5 min, 2 sets Walk 14 min, rest 5 min, 2 sets Walk 15 min, rest 5 min, 2 sets Walk 15 min,  rest 4 min, 2 sets Walk 15 min, rest 3 min, 2 sets Walk 15 min, rest 2 min, 2 sets Walk 15 min, rest 1 min, 2 sets Walk 30 min   ASSESSMENT: CLINICAL IMPRESSION: Pt met 4 STGs. She is improved activity tolerance and balance.  She appears to be compliant with HEP / fitness program which is improving flexibility, strength, balance & endurance. She improved ability to neg ramps & curbs without device with instruction but still needs supervision.   OBJECTIVE IMPAIRMENTS: Abnormal gait, cardiopulmonary status limiting activity, decreased activity tolerance, decreased balance, decreased endurance, decreased knowledge of use of DME, decreased mobility, difficulty walking, decreased strength, increased edema, postural dysfunction, prosthetic dependency , and pain.   ACTIVITY LIMITATIONS: carrying, lifting, sitting, standing, squatting, stairs, transfers, and locomotion level  PARTICIPATION LIMITATIONS: meal prep, driving, shopping, and community activity  PERSONAL FACTORS: Fitness, Time since onset of injury/illness/exacerbation, and 3+ comorbidities: see PMH  are also affecting patient's  functional outcome.   REHAB POTENTIAL: Good  CLINICAL DECISION MAKING: Evolving/moderate complexity  EVALUATION COMPLEXITY: Moderate   GOALS: Goals reviewed with patient? Yes  SHORT TERM GOALS: Target date: 07/09/2022  Patient verbalizes proper adjusting ply socks with limb volume changes. Baseline: SEE OBJECTIVE DATA Goal status: MET 07/02/2022 2.  Patient tolerates prosthesis >90% awake hrs /day without skin issues or limb pain </= 2/10 after standing. Baseline: SEE OBJECTIVE DATA Goal status:   MET 07/02/2022  3. 4-square test with cane <15 sec safely Baseline: SEE OBJECTIVE DATA Goal status:   ongoing 06/30/2022  4. Patient ambulates 300' with cane & prosthesis with supervision. Baseline: SEE OBJECTIVE DATA Goal status:   MET 07/02/2022  5. Patient demo & verbalizes understanding of updated  HEP Baseline: SEE OBJECTIVE DATA Goal status:   MET 07/02/2022  LONG TERM GOALS: Target date: 08/06/2022  Patient demonstrates & verbalized understanding of prosthetic care to enable safe utilization of prosthesis. Baseline: SEE OBJECTIVE DATA Goal status:   ongoing 06/30/2022  Patient tolerates prosthesis wear >90% of awake hours without skin or limb pain issues. Baseline: SEE OBJECTIVE DATA Goal status:  ongoing 06/30/2022  Berg Balance >46/56 Baseline: SEE OBJECTIVE DATA Goal status:   ongoing 06/30/2022  Patient ambulates >500' with prosthesis and cane or less independently Baseline: SEE OBJECTIVE DATA Goal status:  ongoing 06/30/2022  Patient negotiates ramps, curbs & stairs with single rail with prosthesis and cane or less independently. Baseline: SEE OBJECTIVE DATA Goal status:  ongoing 06/30/2022  Patient verbalizes & demonstrates understanding of how to properly use fitness equipment to return to gym. Baseline: SEE OBJECTIVE DATA Goal status:   ongoing 06/30/2022  PLAN:  PT FREQUENCY: 2x/week  PT DURATION: 12 weeks  PLANNED INTERVENTIONS: Therapeutic exercises, Therapeutic activity, Neuromuscular re-education, Balance training, Gait training, Patient/Family education, Self Care, Stair training, Vestibular training, Canalith repositioning, Prosthetic training, DME instructions, and physical performance testing  PLAN FOR NEXT SESSION: check 4-square test with cane, balance activities & gait community beginning to decrease need for cane.    Vladimir Fasterobin Joycelynn Fritsche, PT, DPT 07/02/2022, 1:51 PM

## 2022-07-07 ENCOUNTER — Encounter: Payer: Self-pay | Admitting: Physical Therapy

## 2022-07-07 ENCOUNTER — Ambulatory Visit (INDEPENDENT_AMBULATORY_CARE_PROVIDER_SITE_OTHER): Payer: Medicare Other | Admitting: Physical Therapy

## 2022-07-07 DIAGNOSIS — R293 Abnormal posture: Secondary | ICD-10-CM | POA: Diagnosis not present

## 2022-07-07 DIAGNOSIS — R2689 Other abnormalities of gait and mobility: Secondary | ICD-10-CM

## 2022-07-07 DIAGNOSIS — Z7409 Other reduced mobility: Secondary | ICD-10-CM

## 2022-07-07 DIAGNOSIS — R2681 Unsteadiness on feet: Secondary | ICD-10-CM

## 2022-07-07 DIAGNOSIS — M6281 Muscle weakness (generalized): Secondary | ICD-10-CM | POA: Diagnosis not present

## 2022-07-07 NOTE — Therapy (Signed)
OUTPATIENT PHYSICAL THERAPY TREATMENT  Patient Name: Shelley [name] MRN: [id number] DOB:1951/01/20, 72 y.o., female Today's Date: 07/07/2022  PCP: Babs Sciara, MD REFERRING PROVIDER: Babs Sciara, MD     END OF SESSION:  PT End of Session - 07/07/22 1343     Visit Number 15    Number of Visits 25    Date for PT Re-Evaluation 08/06/22    Authorization Type Medicare & BCBS    Progress Note Due on Visit 20    PT Start Time 1346    PT Stop Time 1430    PT Time Calculation (min) 44 min    Equipment Utilized During Treatment Gait belt    Activity Tolerance Patient tolerated treatment well;Patient limited by fatigue    Behavior During Therapy WFL for tasks assessed/performed               Past Medical History:  Diagnosis Date   Diabetes    Diabetes mellitus without complication    End stage renal disease    HTN (hypertension)    Hyperlipidemia    Hypertension    Past Surgical History:  Procedure Laterality Date   ABDOMINAL HYSTERECTOMY     fibroids/complete hysterec 2000   APPENDECTOMY     CATARACT EXTRACTION Bilateral    COLONOSCOPY N/A 03/12/2016   Procedure: COLONOSCOPY;  Surgeon: Corbin Ade, MD;  Location: [facility];  Service: Endoscopy;  Laterality: N/A;  9:30 am   POLYPECTOMY  03/12/2016   Procedure: POLYPECTOMY;  Surgeon: Corbin Ade, MD;  Location: [facility];  Service: Endoscopy;;  colon   YAG LASER APPLICATION Left 01/22/2014   Procedure: YAG LASER APPLICATION;  Surgeon: Susa Simmonds, MD;  Location: [facility];  Service: Ophthalmology;  Laterality: Left;   Patient Active Problem List   Diagnosis Date Noted   Immunosuppressed status 04/18/2022   Hx of kidney transplant 10/14/2021   Hx of AKA (above knee amputation), left 10/14/2021   Diabetic peripheral neuropathy associated with type 2 diabetes mellitus 10/14/2021   Uncontrolled type 2 diabetes mellitus with hyperglycemia, with long-term current use of insulin 08/27/2017    Hyperlipidemia associated with type 2 diabetes mellitus 03/18/2017   Chronic renal disease, stage V 08/20/2015   Personal history of noncompliance with medical treatment, presenting hazards to health 08/20/2015   Proteinuria 07/04/2014   Uncontrolled type 2 diabetes mellitus with ESRD (end-stage renal disease) 05/21/2014   Hyperlipidemia 05/21/2014   HTN (hypertension) 05/21/2014    ONSET DATE: 04/17/2022 MD referral to PT  REFERRING DIAG:  89.612 (ICD-10-CM) - Hx of AKA (above knee amputation), left (HCC)  R53.1 (ICD-10-CM) - Weakness    THERAPY DIAG:  Muscle weakness (generalized)  Decreased functional mobility and endurance  Other abnormalities of gait and mobility  Abnormal posture  Unsteadiness on feet  Rationale for Evaluation and Treatment: Rehabilitation  SUBJECTIVE:   SUBJECTIVE STATEMENT  She joined [employer] but has not worked out yet. She saw Kidney doctor who thinks she is doing well.    PERTINENT HISTORY: DM2, neuropathy, HTN, CHF, renal CA,CKD st 5,  ESRD, kidney transplant 08/30/2017, Thoracotomy with lobectomy 02/24/2018,   PAIN:  Are you having pain? No: NPRS scale: today 0/10 and no pain since last PT session.  Pain location: left residual limb posteriorly Pain description: spasm, "toothache" Aggravating factors:  prosthesis wear Relieving factors: remove prosthesis including liner within 5-10 min.   PRECAUTIONS: Other: no BP LUE  WEIGHT BEARING RESTRICTIONS: No  FALLS: Has patient  fallen in last 6 months? Yes. Number of falls 4 denies injuries, shakes but not low blood sugar.   LIVING ENVIRONMENT: Lives with: lives with their family sister just moved into pt's home Lives in: House  single level Home Access: Stairs to enter Home layout: One level Stairs: Yes: External: 4 steps; on left going up Has following equipment at home: Single point cane, Environmental consultant - 2 wheeled, Environmental consultant - 4 wheeled, Wheelchair (manual), [employer], and Grab bars  PLOF:  Independent, Independent with household mobility without device, and Independent with community mobility without device prosthesis only for community activities  PATIENT GOALS:   walk in community. Be active without fear of falling.   OBJECTIVE:  COGNITION: Overall cognitive status: Within functional limits for tasks assessed   SENSATION: WFL  POSTURE: rounded shoulders, forward head, flexed trunk , and weight shift right  LOWER EXTREMITY ROM:  ROM P:passive  A:active Right eval Left eval  Hip flexion    Hip extension    Hip abduction    Hip adduction    Hip internal rotation    Hip external rotation    Knee flexion  WFL  Knee extension  WFL  Ankle dorsiflexion    Ankle plantarflexion    Ankle inversion    Ankle eversion     (Blank rows = not tested)  LOWER EXTREMITY MMT:  MMT Right eval Left eval  Hip flexion 5/5 4/5  Hip extension 4/5 4-/5  Hip abduction 4/5 4-/5  Hip adduction    Hip internal rotation    Hip external rotation    Knee flexion 5/5 4/5  Knee extension 5/5 4/5  Ankle dorsiflexion    Ankle plantarflexion    Ankle inversion    Ankle eversion    (Blank rows = not tested)  TRANSFERS: Sit to stand: Modified independence requires UE assist on armrests Stand to sit: Modified independence requires UE assist on armrests  GAIT: 06/17/2022 6 Minute Walk Test with cane & TTA prosthesis:  Pre HR 75 SpO2 97% no dyspnea  Post HR 95 SpO2 93% dyspnea 7/10 with significant ShOB for 3 min post activity Pt amb 545' in 4 min 30 sec with 3 standing 15 sec rests required.  She was unable to complete the 6 min time.    06/10/2022: gait velocity without AD 3.03 ft/sec  Eval / 05/12/2022:  Gait pattern: step through pattern, decreased arm swing- Left, decreased step length- Right, decreased stance time- Left, decreased hip/knee flexion- Left, Left hip hike, antalgic, and abducted- Left Distance walked: 120' Assistive device utilized: arrived using rollator  walker;  assessed with cane & no device. Level of assistance: cane CGA and no device Min A Gait velocity: 2.51 ft/sec with cane &  2.84 ft/sec no device (MinA)  FUNCTIONAL TESTs:  07/07/2022: 4-square step test with cane 14.88sec with SBA and without cane 13.78 sec with CGA  06/10/2022: 4-square step test with cane 17.98sec with CGA and without cane 20.13 sec with minA  06/08/2022: TUG   with cane standard 12.12 sec & cognitive 12.66 sec  Without device standard 9.69 sec & cognitive 11.00 sec Berg Balance 45/56  Eval / 05/12/2022:  Timed up and go (TUG):  standard 14.40sec & Cognitive 18.69sec   Both with cane. Berg Balance Scale: 38/56    CARDIOVASCULAR RESPONSE: Functional activity: Berg & Gait assessment Pre-activity vitals: HR: 76 SpO2: 94% Post-activity vitals: HR: 84 SpO2: 95%   CURRENT PROSTHETIC WEAR ASSESSMENT:  Patient is independent with: skin check,  residual limb care, care of non-amputated limb, prosthetic cleaning, correct ply sock adjustment, and proper wear schedule/adjustment Patient is dependent with: donning & managing edema / limb volume Donning prosthesis: SBA Doffing prosthesis: Modified independence Prosthetic wear tolerance: 10-12 hours of 15-16 awake hours (62-80% of awake hours) limited by pain Prosthetic weight bearing tolerance: 5 minutes limited by fatigue Edema: pitting Residual limb condition: no open areas, no hair growth, normal color, temperature & moisture, cylindrical shape Prosthetic description: silicon gel liner, suction pin sleeve with flexible inner socket supspension, total contact socket, dynamic response foot K code/activity level with prosthetic use: Level 3    TODAY'S TREATMENT:                                                                                                                             DATE:  07/07/2022: Prosthetic Training with TTA prosthesis: PT recommended seeing prosthetist for alignment.  PT noticed lateral  pylon lean as she is increasing weight shift onto prosthesis.  Pt amb 100' X 2 with cane with cues for step width.  Stairs flight of 11 steps alternating pattern with 2 rails with supervision / demo & verbal cues on technique.   Self-Care: PT instructed in ADA foot care recommendations. Pt  verbalized understanding.  Neuromuscular Re-education: See objective for 4-square step test Braiding & tandem fore/back in //bars.  PT recommended for home using rail or counter. Pt verbalized understanding.   07/02/2022: Prosthetic Training with TTA prosthesis: Pt amb without device 160' X 2 working on looking forward (not staring at floor) including weaving around obstacles.  Pt verbalized difference in upper body posture & balance.  Picking up objects from floor with PT cues (demo & verbal) on technique.  Pt performed 6 reps with supervision.  PT demo & verbal cues on neg curb & ramps without device. Pt performed with supervision.  PT recommended short / household without device, medium / basic community initiate carrying cane & use cane prn for distance, surface & ramps/curbs,  long walk with rollator / walking program. Pt verbalized understanding.    06/30/2022: Therapeutic Exercise: Standing with cane & chair back support: red theraband kicks 10 reps ea motion abd, flex, add & ext BLEs. Can increase difficulty by shortening band, adding reps or decrease chair back support.  Stretches 30 sec hold 2 reps ea LE: seated hamstring with strap, standing gastroc with 2" block, seated trunk rotation & supine quad. PT added to HEP with verbal, demo & HO cues. Pt verbalized & return demo understanding.     HOME EXERCISE PROGRAM: Access Code: [email or web address] URL: [email or web address] Date: 06/30/2022 Prepared by: Vladimir Faster  Exercises - Tandem Walking with Counter Support  - 1 x daily - 5-7 x weekly - 1 sets - 3-5 reps - Backward Walking with Counter Support  - 1 x daily - 5-7 x weekly - 1 sets  - 3-5 reps - [place]arioca with Counter Support  - 1 x daily -  5-7 x weekly - 1 sets - 3-5 reps - Standing Tandem Balance with Counter Support  - 1 x daily - 5-7 x weekly - 1 sets - 1 reps - 50 seconds hold - Standing Rolling Forward & Back, Side to Side and Circles  - 1 x daily - 5-7 x weekly - 1 sets - 10 reps - Standing Hip Flexion with Resistance (Mirrored)  - 1 x daily - 7 x weekly - 1 sets - 10 reps - Standing Hip Extension with Resistance  - 1 x daily - 7 x weekly - 1 sets - 10 reps - Standing Hip Adduction with Resistance (Mirrored)  - 1 x daily - 7 x weekly - 1 sets - 10 reps - Standing Hip Abduction with Theraband Resistance  - 1 x daily - 7 x weekly - 1 sets - 10 reps - Seated Hamstring Stretch with Strap  - 1 x daily - 7 x weekly - 1 sets - 2-3 reps - 30 seconds hold - standing calf stretch with forefoot on small step or brick  - 1 x daily - 7 x weekly - 1 sets - 2-3 reps - 30 seconds hold - Seated Trunk Rotation Stretch  - 1 x daily - 7 x weekly - 1 sets - 2-3 reps - 30 seconds hold - Supine Quadriceps Stretch with Strap on Table  - 1 x daily - 7 x weekly - 1 sets - 2-3 reps - 30 seconds hold  Increasing your activity level is important.  Short distances which is walking from one room to another. Work to increase frequency back to prior level.  Medium distances are entering & exiting your home or community with limited distances. Start with 4 medium walks which is one outing to one location and increase number of tolerated amounts per day.  Long walk is walking program Walking Program 3-5 days/week. Use rollator walker to enable resting spot and light upper extremity support. Progress to next level when current level is not challenge or difficult.  Start walking 5 min, rest 5 min for 2 sets Add 3rd set. So you are walking total of 15 min.  Decrease rest to 4 min. Walk 5 min for 3 sets Walk 6 min, rest 4 min for 3 sets. Decrease rest to 3 min. Walking 6 min for 3 sets Walk 7 min,  rest 3 min, 3 sets Walk 10 min, rest 5 min 2 sets Walk 11 min, rest 5 min, 2 sets Walk 12 min, rest 5 min, 2 sets Walk 13 min, rest 5 min, 2 sets Walk 14 min, rest 5 min, 2 sets Walk 15 min, rest 5 min, 2 sets Walk 15 min, rest 4 min, 2 sets Walk 15 min, rest 3 min, 2 sets Walk 15 min, rest 2 min, 2 sets Walk 15 min, rest 1 min, 2 sets Walk 30 min   ASSESSMENT: CLINICAL IMPRESSION: Patient improved time for 4 square step test which indicates lower fall risk.  PT instructed in diabetic footcare recommendations which she appears to understand.  PT instructed and worked on alternating pattern on stairs which she appears to have understand.  She does require use of 2 rails but this activity should improve lower extremity strength control buckling of knees with some gait activities.    OBJECTIVE IMPAIRMENTS: Abnormal gait, cardiopulmonary status limiting activity, decreased activity tolerance, decreased balance, decreased endurance, decreased knowledge of use of DME, decreased mobility, difficulty walking, decreased strength, increased edema, postural dysfunction, prosthetic dependency ,  and pain.   ACTIVITY LIMITATIONS: carrying, lifting, sitting, standing, squatting, stairs, transfers, and locomotion level  PARTICIPATION LIMITATIONS: meal prep, driving, shopping, and community activity  PERSONAL FACTORS: Fitness, Time since onset of injury/illness/exacerbation, and 3+ comorbidities: see PMH  are also affecting patient's functional outcome.   REHAB POTENTIAL: Good  CLINICAL DECISION MAKING: Evolving/moderate complexity  EVALUATION COMPLEXITY: Moderate   GOALS: Goals reviewed with patient? Yes  SHORT TERM GOALS: Target date: 07/09/2022  Patient verbalizes proper adjusting ply socks with limb volume changes. Baseline: SEE OBJECTIVE DATA Goal status: MET 07/02/2022 2.  Patient tolerates prosthesis >90% awake hrs /day without skin issues or limb pain </= 2/10 after standing. Baseline:  SEE OBJECTIVE DATA Goal status:   MET 07/02/2022  3. 4-square test with cane <15 sec safely Baseline: SEE OBJECTIVE DATA Goal status:   MET 07/07/2022  4. Patient ambulates 300' with cane & prosthesis with supervision. Baseline: SEE OBJECTIVE DATA Goal status:   MET 07/02/2022  5. Patient demo & verbalizes understanding of updated HEP Baseline: SEE OBJECTIVE DATA Goal status:   MET 07/02/2022  LONG TERM GOALS: Target date: 08/06/2022  Patient demonstrates & verbalized understanding of prosthetic care to enable safe utilization of prosthesis. Baseline: SEE OBJECTIVE DATA Goal status:   ongoing 06/30/2022  Patient tolerates prosthesis wear >90% of awake hours without skin or limb pain issues. Baseline: SEE OBJECTIVE DATA Goal status:  ongoing 06/30/2022  Berg Balance >46/56 Baseline: SEE OBJECTIVE DATA Goal status:   ongoing 06/30/2022  Patient ambulates >500' with prosthesis and cane or less independently Baseline: SEE OBJECTIVE DATA Goal status:  ongoing 06/30/2022  Patient negotiates ramps, curbs & stairs with single rail with prosthesis and cane or less independently. Baseline: SEE OBJECTIVE DATA Goal status:  ongoing 06/30/2022  Patient verbalizes & demonstrates understanding of how to properly use fitness equipment to return to gym. Baseline: SEE OBJECTIVE DATA Goal status:   ongoing 06/30/2022  PLAN:  PT FREQUENCY: 2x/week  PT DURATION: 12 weeks  PLANNED INTERVENTIONS: Therapeutic exercises, Therapeutic activity, Neuromuscular re-education, Balance training, Gait training, Patient/Family education, Self Care, Stair training, Vestibular training, Canalith repositioning, Prosthetic training, DME instructions, and physical performance testing  PLAN FOR NEXT SESSION: Continue balance activities & gait community beginning to decrease need for cane.  Work on ramps curbs and stairs.  Check if she went to [employer].   Vladimir Faster, PT, DPT 07/07/2022, 4:38 PM

## 2022-07-09 ENCOUNTER — Encounter: Payer: Medicare Other | Admitting: Physical Therapy

## 2022-07-13 ENCOUNTER — Ambulatory Visit (INDEPENDENT_AMBULATORY_CARE_PROVIDER_SITE_OTHER): Payer: Medicare Other | Admitting: Physical Therapy

## 2022-07-13 ENCOUNTER — Encounter: Payer: Self-pay | Admitting: Physical Therapy

## 2022-07-13 DIAGNOSIS — R2689 Other abnormalities of gait and mobility: Secondary | ICD-10-CM

## 2022-07-13 DIAGNOSIS — R293 Abnormal posture: Secondary | ICD-10-CM

## 2022-07-13 DIAGNOSIS — M6281 Muscle weakness (generalized): Secondary | ICD-10-CM

## 2022-07-13 DIAGNOSIS — Z7409 Other reduced mobility: Secondary | ICD-10-CM | POA: Diagnosis not present

## 2022-07-13 DIAGNOSIS — R2681 Unsteadiness on feet: Secondary | ICD-10-CM

## 2022-07-13 NOTE — Therapy (Signed)
OUTPATIENT PHYSICAL THERAPY TREATMENT  Patient Name: Shelley [name] MRN: [id number] DOB:1950-08-20, 72 y.o., female Today's Date: 07/13/2022  PCP: Babs Sciara, MD REFERRING PROVIDER: Babs Sciara, MD     END OF SESSION:  PT End of Session - 07/13/22 1348     Visit Number 16    Number of Visits 25    Date for PT Re-Evaluation 08/06/22    Authorization Type Medicare & BCBS    Progress Note Due on Visit 20    PT Start Time 1345    PT Stop Time 1427    PT Time Calculation (min) 42 min    Equipment Utilized During Treatment Gait belt    Activity Tolerance Patient tolerated treatment well;Patient limited by fatigue    Behavior During Therapy WFL for tasks assessed/performed                Past Medical History:  Diagnosis Date   Diabetes    Diabetes mellitus without complication    End stage renal disease    HTN (hypertension)    Hyperlipidemia    Hypertension    Past Surgical History:  Procedure Laterality Date   ABDOMINAL HYSTERECTOMY     fibroids/complete hysterec 2000   APPENDECTOMY     CATARACT EXTRACTION Bilateral    COLONOSCOPY N/A 03/12/2016   Procedure: COLONOSCOPY;  Surgeon: Corbin Ade, MD;  Location: [facility];  Service: Endoscopy;  Laterality: N/A;  9:30 am   POLYPECTOMY  03/12/2016   Procedure: POLYPECTOMY;  Surgeon: Corbin Ade, MD;  Location: [facility];  Service: Endoscopy;;  colon   YAG LASER APPLICATION Left 01/22/2014   Procedure: YAG LASER APPLICATION;  Surgeon: Susa Simmonds, MD;  Location: [facility];  Service: Ophthalmology;  Laterality: Left;   Patient Active Problem List   Diagnosis Date Noted   Immunosuppressed status 04/18/2022   Hx of kidney transplant 10/14/2021   Hx of AKA (above knee amputation), left 10/14/2021   Diabetic peripheral neuropathy associated with type 2 diabetes mellitus 10/14/2021   Uncontrolled type 2 diabetes mellitus with hyperglycemia, with long-term current use of insulin 08/27/2017    Hyperlipidemia associated with type 2 diabetes mellitus 03/18/2017   Chronic renal disease, stage V 08/20/2015   Personal history of noncompliance with medical treatment, presenting hazards to health 08/20/2015   Proteinuria 07/04/2014   Uncontrolled type 2 diabetes mellitus with ESRD (end-stage renal disease) 05/21/2014   Hyperlipidemia 05/21/2014   HTN (hypertension) 05/21/2014    ONSET DATE: 04/17/2022 MD referral to PT  REFERRING DIAG:  89.612 (ICD-10-CM) - Hx of AKA (above knee amputation), left (HCC)  R53.1 (ICD-10-CM) - Weakness    THERAPY DIAG:  Muscle weakness (generalized)  Decreased functional mobility and endurance  Other abnormalities of gait and mobility  Abnormal posture  Unsteadiness on feet  Rationale for Evaluation and Treatment: Rehabilitation  SUBJECTIVE:   SUBJECTIVE STATEMENT  She saw eye doctor on Thursday who got shot for pressure.  She limited her activity per requirements.     PERTINENT HISTORY: DM2, neuropathy, HTN, CHF, renal CA,CKD st 5,  ESRD, kidney transplant 08/30/2017, Thoracotomy with lobectomy 02/24/2018,   PAIN:  Are you having pain? No: NPRS scale: today 0/10 and no pain since last PT session.  Pain location: left residual limb posteriorly Pain description: spasm, "toothache" Aggravating factors:  prosthesis wear Relieving factors: remove prosthesis including liner within 5-10 min.   PRECAUTIONS: Other: no BP LUE  WEIGHT BEARING RESTRICTIONS: No  FALLS: Has patient  fallen in last 6 months? Yes. Number of falls 4 denies injuries, shakes but not low blood sugar.   LIVING ENVIRONMENT: Lives with: lives with their family sister just moved into pt's home Lives in: House  single level Home Access: Stairs to enter Home layout: One level Stairs: Yes: External: 4 steps; on left going up Has following equipment at home: Single point cane, Environmental consultant - 2 wheeled, Environmental consultant - 4 wheeled, Wheelchair (manual), [employer], and Grab bars  PLOF:  Independent, Independent with household mobility without device, and Independent with community mobility without device prosthesis only for community activities  PATIENT GOALS:   walk in community. Be active without fear of falling.   OBJECTIVE:  COGNITION: Overall cognitive status: Within functional limits for tasks assessed   SENSATION: WFL  POSTURE: rounded shoulders, forward head, flexed trunk , and weight shift right  LOWER EXTREMITY ROM:  ROM P:passive  A:active Right eval Left eval  Hip flexion    Hip extension    Hip abduction    Hip adduction    Hip internal rotation    Hip external rotation    Knee flexion  WFL  Knee extension  WFL  Ankle dorsiflexion    Ankle plantarflexion    Ankle inversion    Ankle eversion     (Blank rows = not tested)  LOWER EXTREMITY MMT:  MMT Right eval Left eval  Hip flexion 5/5 4/5  Hip extension 4/5 4-/5  Hip abduction 4/5 4-/5  Hip adduction    Hip internal rotation    Hip external rotation    Knee flexion 5/5 4/5  Knee extension 5/5 4/5  Ankle dorsiflexion    Ankle plantarflexion    Ankle inversion    Ankle eversion    (Blank rows = not tested)  TRANSFERS: Sit to stand: Modified independence requires UE assist on armrests Stand to sit: Modified independence requires UE assist on armrests  GAIT: 06/17/2022 6 Minute Walk Test with cane & TTA prosthesis:  Pre HR 75 SpO2 97% no dyspnea  Post HR 95 SpO2 93% dyspnea 7/10 with significant ShOB for 3 min post activity Pt amb 545' in 4 min 30 sec with 3 standing 15 sec rests required.  She was unable to complete the 6 min time.    06/10/2022: gait velocity without AD 3.03 ft/sec  Eval / 05/12/2022:  Gait pattern: step through pattern, decreased arm swing- Left, decreased step length- Right, decreased stance time- Left, decreased hip/knee flexion- Left, Left hip hike, antalgic, and abducted- Left Distance walked: 120' Assistive device utilized: arrived using rollator  walker;  assessed with cane & no device. Level of assistance: cane CGA and no device Min A Gait velocity: 2.51 ft/sec with cane &  2.84 ft/sec no device (MinA)  FUNCTIONAL TESTs:  07/07/2022: 4-square step test with cane 14.88sec with SBA and without cane 13.78 sec with CGA  06/10/2022: 4-square step test with cane 17.98sec with CGA and without cane 20.13 sec with minA  06/08/2022: TUG   with cane standard 12.12 sec & cognitive 12.66 sec  Without device standard 9.69 sec & cognitive 11.00 sec Berg Balance 45/56  Eval / 05/12/2022:  Timed up and go (TUG):  standard 14.40sec & Cognitive 18.69sec   Both with cane. Berg Balance Scale: 38/56    CARDIOVASCULAR RESPONSE: Functional activity: Berg & Gait assessment Pre-activity vitals: HR: 76 SpO2: 94% Post-activity vitals: HR: 84 SpO2: 95%   CURRENT PROSTHETIC WEAR ASSESSMENT:  Patient is independent with: skin check,  residual limb care, care of non-amputated limb, prosthetic cleaning, correct ply sock adjustment, and proper wear schedule/adjustment Patient is dependent with: donning & managing edema / limb volume Donning prosthesis: SBA Doffing prosthesis: Modified independence Prosthetic wear tolerance: 10-12 hours of 15-16 awake hours (62-80% of awake hours) limited by pain Prosthetic weight bearing tolerance: 5 minutes limited by fatigue Edema: pitting Residual limb condition: no open areas, no hair growth, normal color, temperature & moisture, cylindrical shape Prosthetic description: silicon gel liner, suction pin sleeve with flexible inner socket supspension, total contact socket, dynamic response foot K code/activity level with prosthetic use: Level 3    TODAY'S TREATMENT:                                                                                                                             DATE:  07/13/2022: Prosthetic Training with TTA prosthesis: Patient ambulated 150 feet x 2 with prosthesis only with  supervision. Patient negotiated flight of 11 steps with single rail and cane with step to pattern alternating lead lower extremity both descending and ascending.  PT and patient note weakness both concentric and eccentric with this activity.  Patient appears safe to work on this activity to improve strength and function.  Therapeutic exercise: PT reviewed [employer] recommendations to initiate weight machines with 4-5 upper body and 4-5 lower body machines.  Only performing 1 set of 10-15 reps until her body accommodates the new activity.  Patient verbalized understanding Braiding with bilateral upper extremity support counter 3 laps.  PT explained rationale for this exercise for balance recovery. Single-leg stance with contralateral lower extremity on slider /pillowcase over shoe with stance lower extremity flexion on the outward motion and extension on the inward motion, sink and cane support, 3 cones (anterior-lateral, lateral and posterior-lateral) 5 reps each lower extremity.  Patient is not safe with this activity outside of PT yet.   07/07/2022: Prosthetic Training with TTA prosthesis: PT recommended seeing prosthetist for alignment.  PT noticed lateral pylon lean as she is increasing weight shift onto prosthesis.  Pt amb 100' X 2 with cane with cues for step width.  Stairs flight of 11 steps alternating pattern with 2 rails with supervision / demo & verbal cues on technique.   Self-Care: PT instructed in ADA foot care recommendations. Pt  verbalized understanding.  Neuromuscular Re-education: See objective for 4-square step test Braiding & tandem fore/back in //bars.  PT recommended for home using rail or counter. Pt verbalized understanding.   07/02/2022: Prosthetic Training with TTA prosthesis: Pt amb without device 160' X 2 working on looking forward (not staring at floor) including weaving around obstacles.  Pt verbalized difference in upper body posture & balance.  Picking up  objects from floor with PT cues (demo & verbal) on technique.  Pt performed 6 reps with supervision.  PT demo & verbal cues on neg curb & ramps without device. Pt performed with supervision.  PT recommended short / household  without device, medium / basic community initiate carrying cane & use cane prn for distance, surface & ramps/curbs,  long walk with rollator / walking program. Pt verbalized understanding.    HOME EXERCISE PROGRAM: Access Code: [email or web address] URL: [email or web address] Date: 06/30/2022 Prepared by: Vladimir Faster  Exercises - Tandem Walking with Counter Support  - 1 x daily - 5-7 x weekly - 1 sets - 3-5 reps - Backward Walking with Counter Support  - 1 x daily - 5-7 x weekly - 1 sets - 3-5 reps - [place] with Counter Support  - 1 x daily - 5-7 x weekly - 1 sets - 3-5 reps - Standing Tandem Balance with Counter Support  - 1 x daily - 5-7 x weekly - 1 sets - 1 reps - 50 seconds hold - Standing Rolling Forward & Back, Side to Side and Circles  - 1 x daily - 5-7 x weekly - 1 sets - 10 reps - Standing Hip Flexion with Resistance (Mirrored)  - 1 x daily - 7 x weekly - 1 sets - 10 reps - Standing Hip Extension with Resistance  - 1 x daily - 7 x weekly - 1 sets - 10 reps - Standing Hip Adduction with Resistance (Mirrored)  - 1 x daily - 7 x weekly - 1 sets - 10 reps - Standing Hip Abduction with Theraband Resistance  - 1 x daily - 7 x weekly - 1 sets - 10 reps - Seated Hamstring Stretch with Strap  - 1 x daily - 7 x weekly - 1 sets - 2-3 reps - 30 seconds hold - standing calf stretch with forefoot on small step or brick  - 1 x daily - 7 x weekly - 1 sets - 2-3 reps - 30 seconds hold - Seated Trunk Rotation Stretch  - 1 x daily - 7 x weekly - 1 sets - 2-3 reps - 30 seconds hold - Supine Quadriceps Stretch with Strap on Table  - 1 x daily - 7 x weekly - 1 sets - 2-3 reps - 30 seconds hold  Increasing your activity level is important.  Short distances which is walking  from one room to another. Work to increase frequency back to prior level.  Medium distances are entering & exiting your home or community with limited distances. Start with 4 medium walks which is one outing to one location and increase number of tolerated amounts per day.  Long walk is walking program Walking Program 3-5 days/week. Use rollator walker to enable resting spot and light upper extremity support. Progress to next level when current level is not challenge or difficult.  Start walking 5 min, rest 5 min for 2 sets Add 3rd set. So you are walking total of 15 min.  Decrease rest to 4 min. Walk 5 min for 3 sets Walk 6 min, rest 4 min for 3 sets. Decrease rest to 3 min. Walking 6 min for 3 sets Walk 7 min, rest 3 min, 3 sets Walk 10 min, rest 5 min 2 sets Walk 11 min, rest 5 min, 2 sets Walk 12 min, rest 5 min, 2 sets Walk 13 min, rest 5 min, 2 sets Walk 14 min, rest 5 min, 2 sets Walk 15 min, rest 5 min, 2 sets Walk 15 min, rest 4 min, 2 sets Walk 15 min, rest 3 min, 2 sets Walk 15 min, rest 2 min, 2 sets Walk 15 min, rest 1 min, 2 sets Walk 30 min   ASSESSMENT: CLINICAL IMPRESSION:  weakness & muscle endurance continue to limit patient's activity tolerance.  Pt continues to benefit from skilled PT.   OBJECTIVE IMPAIRMENTS: Abnormal gait, cardiopulmonary status limiting activity, decreased activity tolerance, decreased balance, decreased endurance, decreased knowledge of use of DME, decreased mobility, difficulty walking, decreased strength, increased edema, postural dysfunction, prosthetic dependency , and pain.   ACTIVITY LIMITATIONS: carrying, lifting, sitting, standing, squatting, stairs, transfers, and locomotion level  PARTICIPATION LIMITATIONS: meal prep, driving, shopping, and community activity  PERSONAL FACTORS: Fitness, Time since onset of injury/illness/exacerbation, and 3+ comorbidities: see PMH  are also affecting patient's functional outcome.   REHAB  POTENTIAL: Good  CLINICAL DECISION MAKING: Evolving/moderate complexity  EVALUATION COMPLEXITY: Moderate   GOALS: Goals reviewed with patient? Yes  SHORT TERM GOALS: Target date: 07/09/2022  Patient verbalizes proper adjusting ply socks with limb volume changes. Baseline: SEE OBJECTIVE DATA Goal status: MET 07/02/2022 2.  Patient tolerates prosthesis >90% awake hrs /day without skin issues or limb pain </= 2/10 after standing. Baseline: SEE OBJECTIVE DATA Goal status:   MET 07/02/2022  3. 4-square test with cane <15 sec safely Baseline: SEE OBJECTIVE DATA Goal status:   MET 07/07/2022  4. Patient ambulates 300' with cane & prosthesis with supervision. Baseline: SEE OBJECTIVE DATA Goal status:   MET 07/02/2022  5. Patient demo & verbalizes understanding of updated HEP Baseline: SEE OBJECTIVE DATA Goal status:   MET 07/02/2022  LONG TERM GOALS: Target date: 08/06/2022  Patient demonstrates & verbalized understanding of prosthetic care to enable safe utilization of prosthesis. Baseline: SEE OBJECTIVE DATA Goal status:   ongoing 06/30/2022  Patient tolerates prosthesis wear >90% of awake hours without skin or limb pain issues. Baseline: SEE OBJECTIVE DATA Goal status:  ongoing 06/30/2022  Berg Balance >46/56 Baseline: SEE OBJECTIVE DATA Goal status:   ongoing 06/30/2022  Patient ambulates >500' with prosthesis and cane or less independently Baseline: SEE OBJECTIVE DATA Goal status:  ongoing 06/30/2022  Patient negotiates ramps, curbs & stairs with single rail with prosthesis and cane or less independently. Baseline: SEE OBJECTIVE DATA Goal status:  ongoing 06/30/2022  Patient verbalizes & demonstrates understanding of how to properly use fitness equipment to return to gym. Baseline: SEE OBJECTIVE DATA Goal status:   ongoing 06/30/2022  PLAN:  PT FREQUENCY: 2x/week  PT DURATION: 12 weeks  PLANNED INTERVENTIONS: Therapeutic exercises, Therapeutic activity, Neuromuscular  re-education, Balance training, Gait training, Patient/Family education, Self Care, Stair training, Vestibular training, Canalith repositioning, Prosthetic training, DME instructions, and physical performance testing  PLAN FOR NEXT SESSION: balance activities & gait community beginning to decrease need for cane.  Work on ramps curbs and stairs.  Check if she went to [employer].   Vladimir Faster, PT, DPT 07/13/2022, 2:55 PM

## 2022-07-15 ENCOUNTER — Encounter: Payer: Self-pay | Admitting: Physical Therapy

## 2022-07-15 ENCOUNTER — Ambulatory Visit (INDEPENDENT_AMBULATORY_CARE_PROVIDER_SITE_OTHER): Payer: Medicare Other | Admitting: Physical Therapy

## 2022-07-15 DIAGNOSIS — M6281 Muscle weakness (generalized): Secondary | ICD-10-CM | POA: Diagnosis not present

## 2022-07-15 DIAGNOSIS — Z7409 Other reduced mobility: Secondary | ICD-10-CM

## 2022-07-15 DIAGNOSIS — R2689 Other abnormalities of gait and mobility: Secondary | ICD-10-CM

## 2022-07-15 DIAGNOSIS — R293 Abnormal posture: Secondary | ICD-10-CM

## 2022-07-15 DIAGNOSIS — R2681 Unsteadiness on feet: Secondary | ICD-10-CM

## 2022-07-15 NOTE — Therapy (Signed)
OUTPATIENT PHYSICAL THERAPY TREATMENT  Patient Name: Shelley [name] MRN: [id number] DOB:April 15, 1950, 72 y.o., female Today's Date: 07/15/2022  PCP: Babs Sciara, MD REFERRING PROVIDER: Babs Sciara, MD     END OF SESSION:  PT End of Session - 07/15/22 1344     Visit Number 17    Number of Visits 25    Date for PT Re-Evaluation 08/06/22    Authorization Type Medicare & BCBS    Progress Note Due on Visit 20    PT Start Time 1344    PT Stop Time 1426    PT Time Calculation (min) 42 min    Equipment Utilized During Treatment Gait belt    Activity Tolerance Patient tolerated treatment well;Patient limited by fatigue    Behavior During Therapy WFL for tasks assessed/performed                Past Medical History:  Diagnosis Date   Diabetes    Diabetes mellitus without complication    End stage renal disease    HTN (hypertension)    Hyperlipidemia    Hypertension    Past Surgical History:  Procedure Laterality Date   ABDOMINAL HYSTERECTOMY     fibroids/complete hysterec 2000   APPENDECTOMY     CATARACT EXTRACTION Bilateral    COLONOSCOPY N/A 03/12/2016   Procedure: COLONOSCOPY;  Surgeon: Corbin Ade, MD;  Location: [facility];  Service: Endoscopy;  Laterality: N/A;  9:30 am   POLYPECTOMY  03/12/2016   Procedure: POLYPECTOMY;  Surgeon: Corbin Ade, MD;  Location: [facility];  Service: Endoscopy;;  colon   YAG LASER APPLICATION Left 01/22/2014   Procedure: YAG LASER APPLICATION;  Surgeon: Susa Simmonds, MD;  Location: [facility];  Service: Ophthalmology;  Laterality: Left;   Patient Active Problem List   Diagnosis Date Noted   Immunosuppressed status 04/18/2022   Hx of kidney transplant 10/14/2021   Hx of AKA (above knee amputation), left 10/14/2021   Diabetic peripheral neuropathy associated with type 2 diabetes mellitus 10/14/2021   Uncontrolled type 2 diabetes mellitus with hyperglycemia, with long-term current use of insulin 08/27/2017    Hyperlipidemia associated with type 2 diabetes mellitus 03/18/2017   Chronic renal disease, stage V 08/20/2015   Personal history of noncompliance with medical treatment, presenting hazards to health 08/20/2015   Proteinuria 07/04/2014   Uncontrolled type 2 diabetes mellitus with ESRD (end-stage renal disease) 05/21/2014   Hyperlipidemia 05/21/2014   HTN (hypertension) 05/21/2014    ONSET DATE: 04/17/2022 MD referral to PT  REFERRING DIAG:  89.612 (ICD-10-CM) - Hx of AKA (above knee amputation), left (HCC)  R53.1 (ICD-10-CM) - Weakness    THERAPY DIAG:  Muscle weakness (generalized)  Other abnormalities of gait and mobility  Decreased functional mobility and endurance  Abnormal posture  Unsteadiness on feet  Rationale for Evaluation and Treatment: Rehabilitation  SUBJECTIVE:   SUBJECTIVE STATEMENT  She went to [employer]. She did 1 set of 10 reps of 4 upper body & 4 lower body machines.   PERTINENT HISTORY: DM2, neuropathy, HTN, CHF, renal CA,CKD st 5,  ESRD, kidney transplant 08/30/2017, Thoracotomy with lobectomy 02/24/2018,   PAIN:  Are you having pain? No: NPRS scale: today 0/10 and no pain since last PT session.  Pain location: left residual limb posteriorly Pain description: spasm, "toothache" Aggravating factors:  prosthesis wear Relieving factors: remove prosthesis including liner within 5-10 min.   PRECAUTIONS: Other: no BP LUE  WEIGHT BEARING RESTRICTIONS: No  FALLS: Has  patient fallen in last 6 months? Yes. Number of falls 4 denies injuries, shakes but not low blood sugar.   LIVING ENVIRONMENT: Lives with: lives with their family sister just moved into pt's home Lives in: House  single level Home Access: Stairs to enter Home layout: One level Stairs: Yes: External: 4 steps; on left going up Has following equipment at home: Single point cane, Environmental consultant - 2 wheeled, Environmental consultant - 4 wheeled, Wheelchair (manual), [employer], and Grab bars  PLOF: Independent,  Independent with household mobility without device, and Independent with community mobility without device prosthesis only for community activities  PATIENT GOALS:   walk in community. Be active without fear of falling.   OBJECTIVE:  COGNITION: Overall cognitive status: Within functional limits for tasks assessed   SENSATION: WFL  POSTURE: rounded shoulders, forward head, flexed trunk , and weight shift right  LOWER EXTREMITY ROM:  ROM P:passive  A:active Right eval Left eval  Hip flexion    Hip extension    Hip abduction    Hip adduction    Hip internal rotation    Hip external rotation    Knee flexion  WFL  Knee extension  WFL  Ankle dorsiflexion    Ankle plantarflexion    Ankle inversion    Ankle eversion     (Blank rows = not tested)  LOWER EXTREMITY MMT:  MMT Right eval Left eval  Hip flexion 5/5 4/5  Hip extension 4/5 4-/5  Hip abduction 4/5 4-/5  Hip adduction    Hip internal rotation    Hip external rotation    Knee flexion 5/5 4/5  Knee extension 5/5 4/5  Ankle dorsiflexion    Ankle plantarflexion    Ankle inversion    Ankle eversion    (Blank rows = not tested)  TRANSFERS: Sit to stand: Modified independence requires UE assist on armrests Stand to sit: Modified independence requires UE assist on armrests  GAIT: 06/17/2022 6 Minute Walk Test with cane & TTA prosthesis:  Pre HR 75 SpO2 97% no dyspnea  Post HR 95 SpO2 93% dyspnea 7/10 with significant ShOB for 3 min post activity Pt amb 545' in 4 min 30 sec with 3 standing 15 sec rests required.  She was unable to complete the 6 min time.    06/10/2022: gait velocity without AD 3.03 ft/sec  Eval / 05/12/2022:  Gait pattern: step through pattern, decreased arm swing- Left, decreased step length- Right, decreased stance time- Left, decreased hip/knee flexion- Left, Left hip hike, antalgic, and abducted- Left Distance walked: 120' Assistive device utilized: arrived using rollator walker;  assessed  with cane & no device. Level of assistance: cane CGA and no device Min A Gait velocity: 2.51 ft/sec with cane &  2.84 ft/sec no device (MinA)  FUNCTIONAL TESTs:  07/07/2022: 4-square step test with cane 14.88sec with SBA and without cane 13.78 sec with CGA  06/10/2022: 4-square step test with cane 17.98sec with CGA and without cane 20.13 sec with minA  06/08/2022: TUG   with cane standard 12.12 sec & cognitive 12.66 sec  Without device standard 9.69 sec & cognitive 11.00 sec Berg Balance 45/56  Eval / 05/12/2022:  Timed up and go (TUG):  standard 14.40sec & Cognitive 18.69sec   Both with cane. Berg Balance Scale: 38/56    CARDIOVASCULAR RESPONSE: Functional activity: Berg & Gait assessment Pre-activity vitals: HR: 76 SpO2: 94% Post-activity vitals: HR: 84 SpO2: 95%   CURRENT PROSTHETIC WEAR ASSESSMENT:  Patient is independent with: skin  check, residual limb care, care of non-amputated limb, prosthetic cleaning, correct ply sock adjustment, and proper wear schedule/adjustment Patient is dependent with: donning & managing edema / limb volume Donning prosthesis: SBA Doffing prosthesis: Modified independence Prosthetic wear tolerance: 10-12 hours of 15-16 awake hours (62-80% of awake hours) limited by pain Prosthetic weight bearing tolerance: 5 minutes limited by fatigue Edema: pitting Residual limb condition: no open areas, no hair growth, normal color, temperature & moisture, cylindrical shape Prosthetic description: silicon gel liner, suction pin sleeve with flexible inner socket supspension, total contact socket, dynamic response foot K code/activity level with prosthetic use: Level 3    TODAY'S TREATMENT:                                                                                                                             DATE:  07/15/2022: Therapeutic exercise: Pt took pictures of machines at [employer] to discuss. She had 3 cardio machines of row machine,  recumbent bike & recumbent stepper.  PT recommendation is to use recumbent stepper initially to build endurance. She can use walking program or stepper for sets & rest per program.  Days that weather including temperature is not conducive to exercising outdoors then use stepper.   Weight machines of Pectoralis, seated knee flexion, chest press, seated row & overhead press.  PT reviewed set up for upper body machines with prosthetic heel in contact with floor and pelvis even on seat of machine.  Weight should not be strain.  Start with 1 set of 10 reps and build up to 2-3 sets as tolerated. Reviewed set up of knee flexion with recommendation for calf pad at short to limit prosthesis fulcrum-ing on limb.  PT reviewed that goal is to do 4 components (flexibility, strength, endurance & balance) of fitness plan 2-3 x/wk. Flexibility can be done daily.   If she becomes ill or gets hospitalized then ease back into program  Pt verbalized understanding of above.  Pt also had issue with billing for light weight w/c and requested PT guidance.  PT recommended determining when w/c was ordered and physician who signed discharge summary / that practice or the SW during hospitalization to help her.   07/13/2022: Prosthetic Training with TTA prosthesis: Patient ambulated 150 feet x 2 with prosthesis only with supervision. Patient negotiated flight of 11 steps with single rail and cane with step to pattern alternating lead lower extremity both descending and ascending.  PT and patient note weakness both concentric and eccentric with this activity.  Patient appears safe to work on this activity to improve strength and function.  Therapeutic exercise: PT reviewed [employer] recommendations to initiate weight machines with 4-5 upper body and 4-5 lower body machines.  Only performing 1 set of 10-15 reps until her body accommodates the new activity.  Patient verbalized understanding Braiding with bilateral upper  extremity support counter 3 laps.  PT explained rationale for this exercise for balance recovery. Single-leg stance  with contralateral lower extremity on slider /pillowcase over shoe with stance lower extremity flexion on the outward motion and extension on the inward motion, sink and cane support, 3 cones (anterior-lateral, lateral and posterior-lateral) 5 reps each lower extremity.  Patient is not safe with this activity outside of PT yet.   07/07/2022: Prosthetic Training with TTA prosthesis: PT recommended seeing prosthetist for alignment.  PT noticed lateral pylon lean as she is increasing weight shift onto prosthesis.  Pt amb 100' X 2 with cane with cues for step width.  Stairs flight of 11 steps alternating pattern with 2 rails with supervision / demo & verbal cues on technique.   Self-Care: PT instructed in ADA foot care recommendations. Pt  verbalized understanding.  Neuromuscular Re-education: See objective for 4-square step test Braiding & tandem fore/back in //bars.  PT recommended for home using rail or counter. Pt verbalized understanding.    HOME EXERCISE PROGRAM: Access Code: [email or web address] URL: [email or web address] Date: 06/30/2022 Prepared by: Vladimir Faster  Exercises - Tandem Walking with Counter Support  - 1 x daily - 5-7 x weekly - 1 sets - 3-5 reps - Backward Walking with Counter Support  - 1 x daily - 5-7 x weekly - 1 sets - 3-5 reps - [place]arioca with Counter Support  - 1 x daily - 5-7 x weekly - 1 sets - 3-5 reps - Standing Tandem Balance with Counter Support  - 1 x daily - 5-7 x weekly - 1 sets - 1 reps - 50 seconds hold - Standing Rolling Forward & Back, Side to Side and Circles  - 1 x daily - 5-7 x weekly - 1 sets - 10 reps - Standing Hip Flexion with Resistance (Mirrored)  - 1 x daily - 7 x weekly - 1 sets - 10 reps - Standing Hip Extension with Resistance  - 1 x daily - 7 x weekly - 1 sets - 10 reps - Standing Hip Adduction with Resistance (Mirrored)   - 1 x daily - 7 x weekly - 1 sets - 10 reps - Standing Hip Abduction with Theraband Resistance  - 1 x daily - 7 x weekly - 1 sets - 10 reps - Seated Hamstring Stretch with Strap  - 1 x daily - 7 x weekly - 1 sets - 2-3 reps - 30 seconds hold - standing calf stretch with forefoot on small step or brick  - 1 x daily - 7 x weekly - 1 sets - 2-3 reps - 30 seconds hold - Seated Trunk Rotation Stretch  - 1 x daily - 7 x weekly - 1 sets - 2-3 reps - 30 seconds hold - Supine Quadriceps Stretch with Strap on Table  - 1 x daily - 7 x weekly - 1 sets - 2-3 reps - 30 seconds hold  Increasing your activity level is important.  Short distances which is walking from one room to another. Work to increase frequency back to prior level.  Medium distances are entering & exiting your home or community with limited distances. Start with 4 medium walks which is one outing to one location and increase number of tolerated amounts per day.  Long walk is walking program Walking Program 3-5 days/week. Use rollator walker to enable resting spot and light upper extremity support. Progress to next level when current level is not challenge or difficult.  Start walking 5 min, rest 5 min for 2 sets Add 3rd set. So you are walking total of 15 min.  Decrease rest to  4 min. Walk 5 min for 3 sets Walk 6 min, rest 4 min for 3 sets. Decrease rest to 3 min. Walking 6 min for 3 sets Walk 7 min, rest 3 min, 3 sets Walk 10 min, rest 5 min 2 sets Walk 11 min, rest 5 min, 2 sets Walk 12 min, rest 5 min, 2 sets Walk 13 min, rest 5 min, 2 sets Walk 14 min, rest 5 min, 2 sets Walk 15 min, rest 5 min, 2 sets Walk 15 min, rest 4 min, 2 sets Walk 15 min, rest 3 min, 2 sets Walk 15 min, rest 2 min, 2 sets Walk 15 min, rest 1 min, 2 sets Walk 30 min   ASSESSMENT: CLINICAL IMPRESSION: PT reviewed overview of well rounded fitness plan and relationship to function & activity tolerance.  Pt verbalized understanding.   Pt continues to  benefit from skilled PT.   OBJECTIVE IMPAIRMENTS: Abnormal gait, cardiopulmonary status limiting activity, decreased activity tolerance, decreased balance, decreased endurance, decreased knowledge of use of DME, decreased mobility, difficulty walking, decreased strength, increased edema, postural dysfunction, prosthetic dependency , and pain.   ACTIVITY LIMITATIONS: carrying, lifting, sitting, standing, squatting, stairs, transfers, and locomotion level  PARTICIPATION LIMITATIONS: meal prep, driving, shopping, and community activity  PERSONAL FACTORS: Fitness, Time since onset of injury/illness/exacerbation, and 3+ comorbidities: see PMH  are also affecting patient's functional outcome.   REHAB POTENTIAL: Good  CLINICAL DECISION MAKING: Evolving/moderate complexity  EVALUATION COMPLEXITY: Moderate   GOALS: Goals reviewed with patient? Yes  SHORT TERM GOALS: Target date: 07/09/2022  Patient verbalizes proper adjusting ply socks with limb volume changes. Baseline: SEE OBJECTIVE DATA Goal status: MET 07/02/2022 2.  Patient tolerates prosthesis >90% awake hrs /day without skin issues or limb pain </= 2/10 after standing. Baseline: SEE OBJECTIVE DATA Goal status:   MET 07/02/2022  3. 4-square test with cane <15 sec safely Baseline: SEE OBJECTIVE DATA Goal status:   MET 07/07/2022  4. Patient ambulates 300' with cane & prosthesis with supervision. Baseline: SEE OBJECTIVE DATA Goal status:   MET 07/02/2022  5. Patient demo & verbalizes understanding of updated HEP Baseline: SEE OBJECTIVE DATA Goal status:   MET 07/02/2022  LONG TERM GOALS: Target date: 08/06/2022  Patient demonstrates & verbalized understanding of prosthetic care to enable safe utilization of prosthesis. Baseline: SEE OBJECTIVE DATA Goal status:   ongoing 06/30/2022  Patient tolerates prosthesis wear >90% of awake hours without skin or limb pain issues. Baseline: SEE OBJECTIVE DATA Goal status:  ongoing  06/30/2022  Berg Balance >46/56 Baseline: SEE OBJECTIVE DATA Goal status:   ongoing 06/30/2022  Patient ambulates >500' with prosthesis and cane or less independently Baseline: SEE OBJECTIVE DATA Goal status:  ongoing 06/30/2022  Patient negotiates ramps, curbs & stairs with single rail with prosthesis and cane or less independently. Baseline: SEE OBJECTIVE DATA Goal status:  ongoing 06/30/2022  Patient verbalizes & demonstrates understanding of how to properly use fitness equipment to return to gym. Baseline: SEE OBJECTIVE DATA Goal status:   ongoing 06/30/2022  PLAN:  PT FREQUENCY: 2x/week  PT DURATION: 12 weeks  PLANNED INTERVENTIONS: Therapeutic exercises, Therapeutic activity, Neuromuscular re-education, Balance training, Gait training, Patient/Family education, Self Care, Stair training, Vestibular training, Canalith repositioning, Prosthetic training, DME instructions, and physical performance testing  PLAN FOR NEXT SESSION:  continue balance activities & gait community beginning to decrease need for cane.  Work on ramps curbs and stairs. Address any issues with [employer] workout.     Vladimir Faster, PT, DPT  07/15/2022, 2:49 PM

## 2022-07-20 ENCOUNTER — Encounter: Payer: Self-pay | Admitting: Physical Therapy

## 2022-07-20 ENCOUNTER — Ambulatory Visit (INDEPENDENT_AMBULATORY_CARE_PROVIDER_SITE_OTHER): Payer: Medicare Other | Admitting: Physical Therapy

## 2022-07-20 DIAGNOSIS — Z7409 Other reduced mobility: Secondary | ICD-10-CM | POA: Diagnosis not present

## 2022-07-20 DIAGNOSIS — M6281 Muscle weakness (generalized): Secondary | ICD-10-CM | POA: Diagnosis not present

## 2022-07-20 DIAGNOSIS — R2689 Other abnormalities of gait and mobility: Secondary | ICD-10-CM

## 2022-07-20 DIAGNOSIS — R293 Abnormal posture: Secondary | ICD-10-CM | POA: Diagnosis not present

## 2022-07-20 DIAGNOSIS — R2681 Unsteadiness on feet: Secondary | ICD-10-CM

## 2022-07-20 NOTE — Therapy (Signed)
OUTPATIENT PHYSICAL THERAPY TREATMENT  Patient Name: Shelley [name] MRN: [id number] DOB:1951/03/08, 72 y.o., female Today's Date: 07/20/2022  PCP: Babs Sciara, MD REFERRING PROVIDER: Babs Sciara, MD     END OF SESSION:  PT End of Session - 07/20/22 1344     Visit Number 18    Number of Visits 25    Date for PT Re-Evaluation 08/06/22    Authorization Type Medicare & BCBS    Progress Note Due on Visit 20    PT Start Time 1344    PT Stop Time 1423    PT Time Calculation (min) 39 min    Equipment Utilized During Treatment Gait belt    Activity Tolerance Patient tolerated treatment well;Patient limited by fatigue    Behavior During Therapy WFL for tasks assessed/performed                Past Medical History:  Diagnosis Date   Diabetes (HCC)    Diabetes mellitus without complication (HCC)    End stage renal disease (HCC)    HTN (hypertension)    Hyperlipidemia    Hypertension    Past Surgical History:  Procedure Laterality Date   ABDOMINAL HYSTERECTOMY     fibroids/complete hysterec 2000   APPENDECTOMY     CATARACT EXTRACTION Bilateral    COLONOSCOPY N/A 03/12/2016   Procedure: COLONOSCOPY;  Surgeon: Corbin Ade, MD;  Location: [facility];  Service: Endoscopy;  Laterality: N/A;  9:30 am   POLYPECTOMY  03/12/2016   Procedure: POLYPECTOMY;  Surgeon: Corbin Ade, MD;  Location: [facility];  Service: Endoscopy;;  colon   YAG LASER APPLICATION Left 01/22/2014   Procedure: YAG LASER APPLICATION;  Surgeon: Susa Simmonds, MD;  Location: [facility];  Service: Ophthalmology;  Laterality: Left;   Patient Active Problem List   Diagnosis Date Noted   Immunosuppressed status (HCC) 04/18/2022   Hx of kidney transplant 10/14/2021   Hx of AKA (above knee amputation), left (HCC) 10/14/2021   Diabetic peripheral neuropathy associated with type 2 diabetes mellitus (HCC) 10/14/2021   Uncontrolled type 2 diabetes mellitus with hyperglycemia, with long-term current  use of insulin (HCC) 08/27/2017   Hyperlipidemia associated with type 2 diabetes mellitus (HCC) 03/18/2017   Chronic renal disease, stage V (HCC) 08/20/2015   Personal history of noncompliance with medical treatment, presenting hazards to health 08/20/2015   Proteinuria 07/04/2014   Uncontrolled type 2 diabetes mellitus with ESRD (end-stage renal disease) 05/21/2014   Hyperlipidemia 05/21/2014   HTN (hypertension) 05/21/2014    ONSET DATE: 04/17/2022 MD referral to PT  REFERRING DIAG:  89.612 (ICD-10-CM) - Hx of AKA (above knee amputation), left (HCC)  R53.1 (ICD-10-CM) - Weakness    THERAPY DIAG:  Muscle weakness (generalized)  Other abnormalities of gait and mobility  Decreased functional mobility and endurance  Abnormal posture  Unsteadiness on feet  Rationale for Evaluation and Treatment: Rehabilitation  SUBJECTIVE:   SUBJECTIVE STATEMENT  She found out that cardiologist ordered the w/c. She walked a lot yesterday.  She was able to walk in back yard with cane with no issues.    PERTINENT HISTORY: DM2, neuropathy, HTN, CHF, renal CA,CKD st 5,  ESRD, kidney transplant 08/30/2017, Thoracotomy with lobectomy 02/24/2018,   PAIN:  Are you having pain? No: NPRS scale: today 0/10 and no pain since last PT session.  Pain location: left residual limb posteriorly Pain description: spasm, "toothache" Aggravating factors:  prosthesis wear Relieving factors: remove prosthesis including liner within 5-10  min.   PRECAUTIONS: Other: no BP LUE  WEIGHT BEARING RESTRICTIONS: No  FALLS: Has patient fallen in last 6 months? Yes. Number of falls 4 denies injuries, shakes but not low blood sugar.   LIVING ENVIRONMENT: Lives with: lives with their family sister just moved into pt's home Lives in: House  single level Home Access: Stairs to enter Home layout: One level Stairs: Yes: External: 4 steps; on left going up Has following equipment at home: Single point cane, Environmental consultant - 2 wheeled,  Environmental consultant - 4 wheeled, Wheelchair (manual), [employer], and Grab bars  PLOF: Independent, Independent with household mobility without device, and Independent with community mobility without device prosthesis only for community activities  PATIENT GOALS:   walk in community. Be active without fear of falling.   OBJECTIVE:  COGNITION: Overall cognitive status: Within functional limits for tasks assessed   SENSATION: WFL  POSTURE: rounded shoulders, forward head, flexed trunk , and weight shift right  LOWER EXTREMITY ROM:  ROM P:passive  A:active Right eval Left eval  Hip flexion    Hip extension    Hip abduction    Hip adduction    Hip internal rotation    Hip external rotation    Knee flexion  WFL  Knee extension  WFL  Ankle dorsiflexion    Ankle plantarflexion    Ankle inversion    Ankle eversion     (Blank rows = not tested)  LOWER EXTREMITY MMT:  MMT Right eval Left eval  Hip flexion 5/5 4/5  Hip extension 4/5 4-/5  Hip abduction 4/5 4-/5  Hip adduction    Hip internal rotation    Hip external rotation    Knee flexion 5/5 4/5  Knee extension 5/5 4/5  Ankle dorsiflexion    Ankle plantarflexion    Ankle inversion    Ankle eversion    (Blank rows = not tested)  TRANSFERS: Sit to stand: Modified independence requires UE assist on armrests Stand to sit: Modified independence requires UE assist on armrests  GAIT: 06/17/2022 6 Minute Walk Test with cane & TTA prosthesis:  Pre HR 75 SpO2 97% no dyspnea  Post HR 95 SpO2 93% dyspnea 7/10 with significant ShOB for 3 min post activity Pt amb 545' in 4 min 30 sec with 3 standing 15 sec rests required.  She was unable to complete the 6 min time.    06/10/2022: gait velocity without AD 3.03 ft/sec  Eval / 05/12/2022:  Gait pattern: step through pattern, decreased arm swing- Left, decreased step length- Right, decreased stance time- Left, decreased hip/knee flexion- Left, Left hip hike, antalgic, and abducted-  Left Distance walked: 120' Assistive device utilized: arrived using rollator walker;  assessed with cane & no device. Level of assistance: cane CGA and no device Min A Gait velocity: 2.51 ft/sec with cane &  2.84 ft/sec no device (MinA)  FUNCTIONAL TESTs:  07/07/2022: 4-square step test with cane 14.88sec with SBA and without cane 13.78 sec with CGA  06/10/2022: 4-square step test with cane 17.98sec with CGA and without cane 20.13 sec with minA  06/08/2022: TUG   with cane standard 12.12 sec & cognitive 12.66 sec  Without device standard 9.69 sec & cognitive 11.00 sec Berg Balance 45/56  Eval / 05/12/2022:  Timed up and go (TUG):  standard 14.40sec & Cognitive 18.69sec   Both with cane. Berg Balance Scale: 38/56    CARDIOVASCULAR RESPONSE: Functional activity: Berg & Gait assessment Pre-activity vitals: HR: 76 SpO2: 94% Post-activity vitals:  HR: 84 SpO2: 95%   CURRENT PROSTHETIC WEAR ASSESSMENT:  Patient is independent with: skin check, residual limb care, care of non-amputated limb, prosthetic cleaning, correct ply sock adjustment, and proper wear schedule/adjustment Patient is dependent with: donning & managing edema / limb volume Donning prosthesis: SBA Doffing prosthesis: Modified independence Prosthetic wear tolerance: 10-12 hours of 15-16 awake hours (62-80% of awake hours) limited by pain Prosthetic weight bearing tolerance: 5 minutes limited by fatigue Edema: pitting Residual limb condition: no open areas, no hair growth, normal color, temperature & moisture, cylindrical shape Prosthetic description: silicon gel liner, suction pin sleeve with flexible inner socket supspension, total contact socket, dynamic response foot K code/activity level with prosthetic use: Level 3    TODAY'S TREATMENT:                                                                                                                             DATE:  07/20/2022: Neuromuscular  Re-ed: Transitioning floor to foam beam - tandem & sidestepping with light BUE support. PT demo & verbal cues.  Stepping on beam & tapping cone with contralateral LE - 5 reps ea LE with light BUE support. Standing crossways on foam beam hip width stance: head turns right/left, up/down & diagonals 5 reps ea and static eyes closed 10 sec 3 reps.  PT demo & verbal cues on doing at home in corner with chair back in front for safety. Pt verbalized understanding.  Sit to/from stand light touch chair seat 5 reps 2 sets. Picking up washcloth from floor 5 reps.   Pt fatigued after each activity noted above.      07/15/2022: Therapeutic exercise: Pt took pictures of machines at [employer] to discuss. She had 3 cardio machines of row machine, recumbent bike & recumbent stepper.  PT recommendation is to use recumbent stepper initially to build endurance. She can use walking program or stepper for sets & rest per program.  Days that weather including temperature is not conducive to exercising outdoors then use stepper.   Weight machines of Pectoralis, seated knee flexion, chest press, seated row & overhead press.  PT reviewed set up for upper body machines with prosthetic heel in contact with floor and pelvis even on seat of machine.  Weight should not be strain.  Start with 1 set of 10 reps and build up to 2-3 sets as tolerated. Reviewed set up of knee flexion with recommendation for calf pad at short to limit prosthesis fulcrum-ing on limb.  PT reviewed that goal is to do 4 components (flexibility, strength, endurance & balance) of fitness plan 2-3 x/wk. Flexibility can be done daily.   If she becomes ill or gets hospitalized then ease back into program  Pt verbalized understanding of above.  Pt also had issue with billing for light weight w/c and requested PT guidance.  PT recommended determining when w/c was ordered and physician who signed discharge summary / that practice or the SW  during  hospitalization to help her.   07/13/2022: Prosthetic Training with TTA prosthesis: Patient ambulated 150 feet x 2 with prosthesis only with supervision. Patient negotiated flight of 11 steps with single rail and cane with step to pattern alternating lead lower extremity both descending and ascending.  PT and patient note weakness both concentric and eccentric with this activity.  Patient appears safe to work on this activity to improve strength and function.  Therapeutic exercise: PT reviewed [employer] recommendations to initiate weight machines with 4-5 upper body and 4-5 lower body machines.  Only performing 1 set of 10-15 reps until her body accommodates the new activity.  Patient verbalized understanding Braiding with bilateral upper extremity support counter 3 laps.  PT explained rationale for this exercise for balance recovery. Single-leg stance with contralateral lower extremity on slider /pillowcase over shoe with stance lower extremity flexion on the outward motion and extension on the inward motion, sink and cane support, 3 cones (anterior-lateral, lateral and posterior-lateral) 5 reps each lower extremity.  Patient is not safe with this activity outside of PT yet.    HOME EXERCISE PROGRAM: Access Code: [email or web address] URL: [email or web address] Date: 06/30/2022 Prepared by: Vladimir Faster  Exercises - Tandem Walking with Counter Support  - 1 x daily - 5-7 x weekly - 1 sets - 3-5 reps - Backward Walking with Counter Support  - 1 x daily - 5-7 x weekly - 1 sets - 3-5 reps - [place] with Counter Support  - 1 x daily - 5-7 x weekly - 1 sets - 3-5 reps - Standing Tandem Balance with Counter Support  - 1 x daily - 5-7 x weekly - 1 sets - 1 reps - 50 seconds hold - Standing Rolling Forward & Back, Side to Side and Circles  - 1 x daily - 5-7 x weekly - 1 sets - 10 reps - Standing Hip Flexion with Resistance (Mirrored)  - 1 x daily - 7 x weekly - 1 sets - 10 reps - Standing  Hip Extension with Resistance  - 1 x daily - 7 x weekly - 1 sets - 10 reps - Standing Hip Adduction with Resistance (Mirrored)  - 1 x daily - 7 x weekly - 1 sets - 10 reps - Standing Hip Abduction with Theraband Resistance  - 1 x daily - 7 x weekly - 1 sets - 10 reps - Seated Hamstring Stretch with Strap  - 1 x daily - 7 x weekly - 1 sets - 2-3 reps - 30 seconds hold - standing calf stretch with forefoot on small step or brick  - 1 x daily - 7 x weekly - 1 sets - 2-3 reps - 30 seconds hold - Seated Trunk Rotation Stretch  - 1 x daily - 7 x weekly - 1 sets - 2-3 reps - 30 seconds hold - Supine Quadriceps Stretch with Strap on Table  - 1 x daily - 7 x weekly - 1 sets - 2-3 reps - 30 seconds hold  Increasing your activity level is important.  Short distances which is walking from one room to another. Work to increase frequency back to prior level.  Medium distances are entering & exiting your home or community with limited distances. Start with 4 medium walks which is one outing to one location and increase number of tolerated amounts per day.  Long walk is walking program Walking Program 3-5 days/week. Use rollator walker to enable resting spot and light upper extremity support. Progress to next level  when current level is not challenge or difficult.  Start walking 5 min, rest 5 min for 2 sets Add 3rd set. So you are walking total of 15 min.  Decrease rest to 4 min. Walk 5 min for 3 sets Walk 6 min, rest 4 min for 3 sets. Decrease rest to 3 min. Walking 6 min for 3 sets Walk 7 min, rest 3 min, 3 sets Walk 10 min, rest 5 min 2 sets Walk 11 min, rest 5 min, 2 sets Walk 12 min, rest 5 min, 2 sets Walk 13 min, rest 5 min, 2 sets Walk 14 min, rest 5 min, 2 sets Walk 15 min, rest 5 min, 2 sets Walk 15 min, rest 4 min, 2 sets Walk 15 min, rest 3 min, 2 sets Walk 15 min, rest 2 min, 2 sets Walk 15 min, rest 1 min, 2 sets Walk 30 min   ASSESSMENT: CLINICAL IMPRESSION:  PT added balance  activities to HEP which she has a basic understanding.    Pt continues to benefit from skilled PT.   OBJECTIVE IMPAIRMENTS: Abnormal gait, cardiopulmonary status limiting activity, decreased activity tolerance, decreased balance, decreased endurance, decreased knowledge of use of DME, decreased mobility, difficulty walking, decreased strength, increased edema, postural dysfunction, prosthetic dependency , and pain.   ACTIVITY LIMITATIONS: carrying, lifting, sitting, standing, squatting, stairs, transfers, and locomotion level  PARTICIPATION LIMITATIONS: meal prep, driving, shopping, and community activity  PERSONAL FACTORS: Fitness, Time since onset of injury/illness/exacerbation, and 3+ comorbidities: see PMH  are also affecting patient's functional outcome.   REHAB POTENTIAL: Good  CLINICAL DECISION MAKING: Evolving/moderate complexity  EVALUATION COMPLEXITY: Moderate   GOALS: Goals reviewed with patient? Yes  SHORT TERM GOALS: Target date: 07/09/2022  Patient verbalizes proper adjusting ply socks with limb volume changes. Baseline: SEE OBJECTIVE DATA Goal status: MET 07/02/2022 2.  Patient tolerates prosthesis >90% awake hrs /day without skin issues or limb pain </= 2/10 after standing. Baseline: SEE OBJECTIVE DATA Goal status:   MET 07/02/2022  3. 4-square test with cane <15 sec safely Baseline: SEE OBJECTIVE DATA Goal status:   MET 07/07/2022  4. Patient ambulates 300' with cane & prosthesis with supervision. Baseline: SEE OBJECTIVE DATA Goal status:   MET 07/02/2022  5. Patient demo & verbalizes understanding of updated HEP Baseline: SEE OBJECTIVE DATA Goal status:   MET 07/02/2022  LONG TERM GOALS: Target date: 08/06/2022  Patient demonstrates & verbalized understanding of prosthetic care to enable safe utilization of prosthesis. Baseline: SEE OBJECTIVE DATA Goal status:   ongoing 06/30/2022  Patient tolerates prosthesis wear >90% of awake hours without skin or limb pain  issues. Baseline: SEE OBJECTIVE DATA Goal status:  ongoing 06/30/2022  Berg Balance >46/56 Baseline: SEE OBJECTIVE DATA Goal status:   ongoing 06/30/2022  Patient ambulates >500' with prosthesis and cane or less independently Baseline: SEE OBJECTIVE DATA Goal status:  ongoing 06/30/2022  Patient negotiates ramps, curbs & stairs with single rail with prosthesis and cane or less independently. Baseline: SEE OBJECTIVE DATA Goal status:  ongoing 06/30/2022  Patient verbalizes & demonstrates understanding of how to properly use fitness equipment to return to gym. Baseline: SEE OBJECTIVE DATA Goal status:   ongoing 06/30/2022  PLAN:  PT FREQUENCY: 2x/week  PT DURATION: 12 weeks  PLANNED INTERVENTIONS: Therapeutic exercises, Therapeutic activity, Neuromuscular re-education, Balance training, Gait training, Patient/Family education, Self Care, Stair training, Vestibular training, Canalith repositioning, Prosthetic training, DME instructions, and physical performance testing  PLAN FOR NEXT SESSION:  add balance activities in corner  to written HEP.  Continue with balance & gait working towards LTGs.    Vladimir Faster, PT, DPT 07/20/2022, 2:28 PM

## 2022-07-21 ENCOUNTER — Encounter: Payer: Self-pay | Admitting: Physical Therapy

## 2022-07-21 ENCOUNTER — Ambulatory Visit (INDEPENDENT_AMBULATORY_CARE_PROVIDER_SITE_OTHER): Payer: Medicare Other | Admitting: Physical Therapy

## 2022-07-21 DIAGNOSIS — Z7409 Other reduced mobility: Secondary | ICD-10-CM

## 2022-07-21 DIAGNOSIS — R293 Abnormal posture: Secondary | ICD-10-CM

## 2022-07-21 DIAGNOSIS — R2689 Other abnormalities of gait and mobility: Secondary | ICD-10-CM | POA: Diagnosis not present

## 2022-07-21 DIAGNOSIS — M6281 Muscle weakness (generalized): Secondary | ICD-10-CM

## 2022-07-21 NOTE — Therapy (Signed)
OUTPATIENT PHYSICAL THERAPY TREATMENT  Patient Name: Shelley [name] MRN: [id number] DOB:1950-08-31, 72 y.o., female Today's Date: 07/21/2022  PCP: Babs Sciara, MD REFERRING PROVIDER: Babs Sciara, MD     END OF SESSION:  PT End of Session - 07/21/22 1342     Visit Number 19    Number of Visits 25    Date for PT Re-Evaluation 08/06/22    Authorization Type Medicare & BCBS    Progress Note Due on Visit 20    PT Start Time 1342    PT Stop Time 1430    PT Time Calculation (min) 48 min    Equipment Utilized During Treatment Gait belt    Activity Tolerance Patient tolerated treatment well;Patient limited by fatigue    Behavior During Therapy WFL for tasks assessed/performed                Past Medical History:  Diagnosis Date   Diabetes (HCC)    Diabetes mellitus without complication (HCC)    End stage renal disease (HCC)    HTN (hypertension)    Hyperlipidemia    Hypertension    Past Surgical History:  Procedure Laterality Date   ABDOMINAL HYSTERECTOMY     fibroids/complete hysterec 2000   APPENDECTOMY     CATARACT EXTRACTION Bilateral    COLONOSCOPY N/A 03/12/2016   Procedure: COLONOSCOPY;  Surgeon: Corbin Ade, MD;  Location: [facility];  Service: Endoscopy;  Laterality: N/A;  9:30 am   POLYPECTOMY  03/12/2016   Procedure: POLYPECTOMY;  Surgeon: Corbin Ade, MD;  Location: [facility];  Service: Endoscopy;;  colon   YAG LASER APPLICATION Left 01/22/2014   Procedure: YAG LASER APPLICATION;  Surgeon: Susa Simmonds, MD;  Location: [facility];  Service: Ophthalmology;  Laterality: Left;   Patient Active Problem List   Diagnosis Date Noted   Immunosuppressed status (HCC) 04/18/2022   Hx of kidney transplant 10/14/2021   Hx of AKA (above knee amputation), left (HCC) 10/14/2021   Diabetic peripheral neuropathy associated with type 2 diabetes mellitus (HCC) 10/14/2021   Uncontrolled type 2 diabetes mellitus with hyperglycemia, with long-term current  use of insulin (HCC) 08/27/2017   Hyperlipidemia associated with type 2 diabetes mellitus (HCC) 03/18/2017   Chronic renal disease, stage V (HCC) 08/20/2015   Personal history of noncompliance with medical treatment, presenting hazards to health 08/20/2015   Proteinuria 07/04/2014   Uncontrolled type 2 diabetes mellitus with ESRD (end-stage renal disease) 05/21/2014   Hyperlipidemia 05/21/2014   HTN (hypertension) 05/21/2014    ONSET DATE: 04/17/2022 MD referral to PT  REFERRING DIAG:  89.612 (ICD-10-CM) - Hx of AKA (above knee amputation), left (HCC)  R53.1 (ICD-10-CM) - Weakness    THERAPY DIAG:  Muscle weakness (generalized)  Other abnormalities of gait and mobility  Decreased functional mobility and endurance  Abnormal posture  Rationale for Evaluation and Treatment: Rehabilitation  SUBJECTIVE:   SUBJECTIVE STATEMENT  She has appt with Sharon Seller, [facility] 08/10/2022.    PERTINENT HISTORY: DM2, neuropathy, HTN, CHF, renal CA,CKD st 5,  ESRD, kidney transplant 08/30/2017, Thoracotomy with lobectomy 02/24/2018,   PAIN:  Are you having pain? No: NPRS scale: today 0/10 and no pain since last PT session.  Pain location: left residual limb posteriorly Pain description: spasm, "toothache" Aggravating factors:  prosthesis wear Relieving factors: remove prosthesis including liner within 5-10 min.   PRECAUTIONS: Other: no BP LUE  WEIGHT BEARING RESTRICTIONS: No  FALLS: Has patient fallen in last 6 months? Yes.  Number of falls 4 denies injuries, shakes but not low blood sugar.   LIVING ENVIRONMENT: Lives with: lives with their family sister just moved into pt's home Lives in: House  single level Home Access: Stairs to enter Home layout: One level Stairs: Yes: External: 4 steps; on left going up Has following equipment at home: Single point cane, Environmental consultant - 2 wheeled, Environmental consultant - 4 wheeled, Wheelchair (manual), [employer], and Grab bars  PLOF: Independent, Independent with  household mobility without device, and Independent with community mobility without device prosthesis only for community activities  PATIENT GOALS:   walk in community. Be active without fear of falling.   OBJECTIVE:  COGNITION: Overall cognitive status: Within functional limits for tasks assessed   SENSATION: WFL  POSTURE: rounded shoulders, forward head, flexed trunk , and weight shift right  LOWER EXTREMITY ROM:  ROM P:passive  A:active Right eval Left eval  Hip flexion    Hip extension    Hip abduction    Hip adduction    Hip internal rotation    Hip external rotation    Knee flexion  WFL  Knee extension  WFL  Ankle dorsiflexion    Ankle plantarflexion    Ankle inversion    Ankle eversion     (Blank rows = not tested)  LOWER EXTREMITY MMT:  MMT Right eval Left eval  Hip flexion 5/5 4/5  Hip extension 4/5 4-/5  Hip abduction 4/5 4-/5  Hip adduction    Hip internal rotation    Hip external rotation    Knee flexion 5/5 4/5  Knee extension 5/5 4/5  Ankle dorsiflexion    Ankle plantarflexion    Ankle inversion    Ankle eversion    (Blank rows = not tested)  TRANSFERS: Sit to stand: Modified independence requires UE assist on armrests Stand to sit: Modified independence requires UE assist on armrests  GAIT: 06/17/2022 6 Minute Walk Test with cane & TTA prosthesis:  Pre HR 75 SpO2 97% no dyspnea  Post HR 95 SpO2 93% dyspnea 7/10 with significant ShOB for 3 min post activity Pt amb 545' in 4 min 30 sec with 3 standing 15 sec rests required.  She was unable to complete the 6 min time.    06/10/2022: gait velocity without AD 3.03 ft/sec  Eval / 05/12/2022:  Gait pattern: step through pattern, decreased arm swing- Left, decreased step length- Right, decreased stance time- Left, decreased hip/knee flexion- Left, Left hip hike, antalgic, and abducted- Left Distance walked: 120' Assistive device utilized: arrived using rollator walker;  assessed with cane & no  device. Level of assistance: cane CGA and no device Min A Gait velocity: 2.51 ft/sec with cane &  2.84 ft/sec no device (MinA)  FUNCTIONAL TESTs:  07/07/2022: 4-square step test with cane 14.88sec with SBA and without cane 13.78 sec with CGA  06/10/2022: 4-square step test with cane 17.98sec with CGA and without cane 20.13 sec with minA  06/08/2022: TUG   with cane standard 12.12 sec & cognitive 12.66 sec  Without device standard 9.69 sec & cognitive 11.00 sec Berg Balance 45/56  Eval / 05/12/2022:  Timed up and go (TUG):  standard 14.40sec & Cognitive 18.69sec   Both with cane. Berg Balance Scale: 38/56    CARDIOVASCULAR RESPONSE: Functional activity: Berg & Gait assessment Pre-activity vitals: HR: 76 SpO2: 94% Post-activity vitals: HR: 84 SpO2: 95%   CURRENT PROSTHETIC WEAR ASSESSMENT:  Patient is independent with: skin check, residual limb care, care of non-amputated  limb, prosthetic cleaning, correct ply sock adjustment, and proper wear schedule/adjustment Patient is dependent with: donning & managing edema / limb volume Donning prosthesis: SBA Doffing prosthesis: Modified independence Prosthetic wear tolerance: 10-12 hours of 15-16 awake hours (62-80% of awake hours) limited by pain Prosthetic weight bearing tolerance: 5 minutes limited by fatigue Edema: pitting Residual limb condition: no open areas, no hair growth, normal color, temperature & moisture, cylindrical shape Prosthetic description: silicon gel liner, suction pin sleeve with flexible inner socket supspension, total contact socket, dynamic response foot K code/activity level with prosthetic use: Level 3    TODAY'S TREATMENT:                                                                                                                             DATE:  07/21/2022: Neuromuscular Re-ed for balance & Therapeutic Exercise: PT reviewed & updated HEP. See below. PT demo, verbal & HO cues. Pt verbalized & return  demo understanding   07/20/2022: Neuromuscular Re-ed: Transitioning floor to foam beam - tandem & sidestepping with light BUE support. PT demo & verbal cues.  Stepping on beam & tapping cone with contralateral LE - 5 reps ea LE with light BUE support. Standing crossways on foam beam hip width stance: head turns right/left, up/down & diagonals 5 reps ea and static eyes closed 10 sec 3 reps.  PT demo & verbal cues on doing at home in corner with chair back in front for safety. Pt verbalized understanding.  Sit to/from stand light touch chair seat 5 reps 2 sets. Picking up washcloth from floor 5 reps.   Pt fatigued after each activity noted above.      07/15/2022: Therapeutic exercise: Pt took pictures of machines at [employer] to discuss. She had 3 cardio machines of row machine, recumbent bike & recumbent stepper.  PT recommendation is to use recumbent stepper initially to build endurance. She can use walking program or stepper for sets & rest per program.  Days that weather including temperature is not conducive to exercising outdoors then use stepper.   Weight machines of Pectoralis, seated knee flexion, chest press, seated row & overhead press.  PT reviewed set up for upper body machines with prosthetic heel in contact with floor and pelvis even on seat of machine.  Weight should not be strain.  Start with 1 set of 10 reps and build up to 2-3 sets as tolerated. Reviewed set up of knee flexion with recommendation for calf pad at short to limit prosthesis fulcrum-ing on limb.  PT reviewed that goal is to do 4 components (flexibility, strength, endurance & balance) of fitness plan 2-3 x/wk. Flexibility can be done daily.   If she becomes ill or gets hospitalized then ease back into program  Pt verbalized understanding of above.  Pt also had issue with billing for light weight w/c and requested PT guidance.  PT recommended determining when w/c was ordered and physician who signed discharge  summary / that practice or the SW during hospitalization to help her.   07/13/2022: Prosthetic Training with TTA prosthesis: Patient ambulated 150 feet x 2 with prosthesis only with supervision. Patient negotiated flight of 11 steps with single rail and cane with step to pattern alternating lead lower extremity both descending and ascending.  PT and patient note weakness both concentric and eccentric with this activity.  Patient appears safe to work on this activity to improve strength and function.  Therapeutic exercise: PT reviewed [employer] recommendations to initiate weight machines with 4-5 upper body and 4-5 lower body machines.  Only performing 1 set of 10-15 reps until her body accommodates the new activity.  Patient verbalized understanding Braiding with bilateral upper extremity support counter 3 laps.  PT explained rationale for this exercise for balance recovery. Single-leg stance with contralateral lower extremity on slider /pillowcase over shoe with stance lower extremity flexion on the outward motion and extension on the inward motion, sink and cane support, 3 cones (anterior-lateral, lateral and posterior-lateral) 5 reps each lower extremity.  Patient is not safe with this activity outside of PT yet.    HOME EXERCISE PROGRAM: Access Code: [email or web address] URL: [email or web address] Date: 07/21/2022 Prepared by: Vladimir Faster  Exercises Stretches daily - Seated Hamstring Stretch with Strap  - 1 x daily - 7 x weekly - 1 sets - 2-3 reps - 30 seconds hold - standing calf stretch with forefoot on small step or brick  - 1 x daily - 7 x weekly - 1 sets - 2-3 reps - 30 seconds hold - Seated Trunk Rotation Stretch  - 1 x daily - 7 x weekly - 1 sets - 2-3 reps - 30 seconds hold - Supine Quadriceps Stretch with Strap on Table  - 1 x daily - 7 x weekly - 1 sets - 2-3 reps - 30 seconds hold Counter Mondays & Thursdays - Tandem Walking with Counter Support  - 1 x daily - 2 x  weekly - 1 sets - 3-5 reps - Backward Walking with Counter Support  - 1 x daily - 2 x weekly - 1 sets - 3-5 reps - Carioca with Counter Support  - 1 x daily - 2 x weekly - 1 sets - 3-5 reps - Standing Tandem Balance with Counter Support  - 1 x daily - 2 x weekly - 1 sets - 1 reps - 50 seconds hold - Standing Rolling Forward & Back, Side to Side and Circles  - 1 x daily -2 x weekly - 1 sets - 10 reps Band exercises with chair back & cane Tuesdays & Fridays - Standing Hip Flexion with Resistance (Mirrored)  - 1 x daily - 2 x weekly - 1 sets - 10 reps - Standing Hip Extension with Resistance  - 1 x daily - 2 x weekly - 1 sets - 10 reps - Standing Hip Adduction with Resistance (Mirrored)  - 1 x daily - 2 x weekly - 1 sets - 10 reps - Standing Hip Abduction with Theraband Resistance  - 1 x daily - 2 x weekly - 1 sets - 10 reps Corner exercises Wednesdays & Saturdays - feet apart standing on floor with head motions  - 1 x daily - 2 x weekly - 1 sets - 5-10 reps - Wide stance on Foam Pad head movements  - 1 x daily - 2 x weekly - 1 sets - 5-10 reps  Increasing your activity level is important.  Short distances which is walking  from one room to another. Work to increase frequency back to prior level.  Medium distances are entering & exiting your home or community with limited distances. Start with 4 medium walks which is one outing to one location and increase number of tolerated amounts per day.  Long walk is walking program Walking Program 3-5 days/week. Can use stepper at [employer] if weather not conducive to walking outdoors.  Use rollator walker to enable resting spot and light upper extremity support. Progress to next level when current level is not challenge or difficult.  Start walking 5 min, rest 5 min for 2 sets Add 3rd set. So you are walking total of 15 min.  Decrease rest to 4 min. Walk 5 min for 3 sets Walk 6 min, rest 4 min for 3 sets. Decrease rest to 3 min. Walking 6 min for  3 sets Walk 7 min, rest 3 min, 3 sets Walk 10 min, rest 5 min 2 sets Walk 11 min, rest 5 min, 2 sets Walk 12 min, rest 5 min, 2 sets Walk 13 min, rest 5 min, 2 sets Walk 14 min, rest 5 min, 2 sets Walk 15 min, rest 5 min, 2 sets Walk 15 min, rest 4 min, 2 sets Walk 15 min, rest 3 min, 2 sets Walk 15 min, rest 2 min, 2 sets Walk 15 min, rest 1 min, 2 sets Walk 30 min   ASSESSMENT: CLINICAL IMPRESSION:  PT reviewed & updated HEP.  PT broke down balance exercises into 3 groups to perform ea 2x/wk. This should vary activities without overwhelming her.  Patient is slowly improving activity tolerance with fitness program PT advised.   OBJECTIVE IMPAIRMENTS: Abnormal gait, cardiopulmonary status limiting activity, decreased activity tolerance, decreased balance, decreased endurance, decreased knowledge of use of DME, decreased mobility, difficulty walking, decreased strength, increased edema, postural dysfunction, prosthetic dependency , and pain.   ACTIVITY LIMITATIONS: carrying, lifting, sitting, standing, squatting, stairs, transfers, and locomotion level  PARTICIPATION LIMITATIONS: meal prep, driving, shopping, and community activity  PERSONAL FACTORS: Fitness, Time since onset of injury/illness/exacerbation, and 3+ comorbidities: see PMH  are also affecting patient's functional outcome.   REHAB POTENTIAL: Good  CLINICAL DECISION MAKING: Evolving/moderate complexity  EVALUATION COMPLEXITY: Moderate   GOALS: Goals reviewed with patient? Yes  SHORT TERM GOALS: Target date: 07/09/2022  Patient verbalizes proper adjusting ply socks with limb volume changes. Baseline: SEE OBJECTIVE DATA Goal status: MET 07/02/2022 2.  Patient tolerates prosthesis >90% awake hrs /day without skin issues or limb pain </= 2/10 after standing. Baseline: SEE OBJECTIVE DATA Goal status:   MET 07/02/2022  3. 4-square test with cane <15 sec safely Baseline: SEE OBJECTIVE DATA Goal status:   MET  07/07/2022  4. Patient ambulates 300' with cane & prosthesis with supervision. Baseline: SEE OBJECTIVE DATA Goal status:   MET 07/02/2022  5. Patient demo & verbalizes understanding of updated HEP Baseline: SEE OBJECTIVE DATA Goal status:   MET 07/02/2022  LONG TERM GOALS: Target date: 08/06/2022  Patient demonstrates & verbalized understanding of prosthetic care to enable safe utilization of prosthesis. Baseline: SEE OBJECTIVE DATA Goal status:   ongoing 06/30/2022  Patient tolerates prosthesis wear >90% of awake hours without skin or limb pain issues. Baseline: SEE OBJECTIVE DATA Goal status:  ongoing 06/30/2022  Berg Balance >46/56 Baseline: SEE OBJECTIVE DATA Goal status:   ongoing 06/30/2022  Patient ambulates >500' with prosthesis and cane or less independently Baseline: SEE OBJECTIVE DATA Goal status:  ongoing 06/30/2022  Patient negotiates ramps,  curbs & stairs with single rail with prosthesis and cane or less independently. Baseline: SEE OBJECTIVE DATA Goal status:  ongoing 06/30/2022  Patient verbalizes & demonstrates understanding of how to properly use fitness equipment to return to gym. Baseline: SEE OBJECTIVE DATA Goal status:   ongoing 06/30/2022  PLAN:  PT FREQUENCY: 2x/week  PT DURATION: 12 weeks  PLANNED INTERVENTIONS: Therapeutic exercises, Therapeutic activity, Neuromuscular re-education, Balance training, Gait training, Patient/Family education, Self Care, Stair training, Vestibular training, Canalith repositioning, Prosthetic training, DME instructions, and physical performance testing  PLAN FOR NEXT SESSION:  check HEP.  Continue with balance & gait working towards LTGs.    Vladimir Faster, PT, DPT 07/21/2022, 4:54 PM

## 2022-07-27 ENCOUNTER — Ambulatory Visit (INDEPENDENT_AMBULATORY_CARE_PROVIDER_SITE_OTHER): Payer: Medicare Other | Admitting: Physical Therapy

## 2022-07-27 ENCOUNTER — Encounter: Payer: Self-pay | Admitting: Physical Therapy

## 2022-07-27 DIAGNOSIS — R293 Abnormal posture: Secondary | ICD-10-CM | POA: Diagnosis not present

## 2022-07-27 DIAGNOSIS — Z7409 Other reduced mobility: Secondary | ICD-10-CM

## 2022-07-27 DIAGNOSIS — R2689 Other abnormalities of gait and mobility: Secondary | ICD-10-CM | POA: Diagnosis not present

## 2022-07-27 DIAGNOSIS — M6281 Muscle weakness (generalized): Secondary | ICD-10-CM | POA: Diagnosis not present

## 2022-07-27 NOTE — Therapy (Signed)
OUTPATIENT PHYSICAL THERAPY TREATMENT & PROGRESS NOTE  Patient Name: Shelley [name] MRN: [id number] DOB:1951/01/02, 72 y.o., female Today's Date: 07/27/2022  PCP: Babs Sciara, MD REFERRING PROVIDER: Babs Sciara, MD  Progress Note Reporting Period 06/23/2022 to 07/27/2022  See note below for Objective Data and Assessment of Progress/Goals.      END OF SESSION:  PT End of Session - 07/27/22 1349     Visit Number 20    Number of Visits 25    Date for PT Re-Evaluation 08/06/22    Authorization Type Medicare & BCBS    PT Start Time 1347    PT Stop Time 1425    PT Time Calculation (min) 38 min    Equipment Utilized During Treatment Gait belt    Activity Tolerance Patient tolerated treatment well;Patient limited by fatigue    Behavior During Therapy WFL for tasks assessed/performed                Past Medical History:  Diagnosis Date   Diabetes (HCC)    Diabetes mellitus without complication (HCC)    End stage renal disease (HCC)    HTN (hypertension)    Hyperlipidemia    Hypertension    Past Surgical History:  Procedure Laterality Date   ABDOMINAL HYSTERECTOMY     fibroids/complete hysterec 2000   APPENDECTOMY     CATARACT EXTRACTION Bilateral    COLONOSCOPY N/A 03/12/2016   Procedure: COLONOSCOPY;  Surgeon: Corbin Ade, MD;  Location: [facility];  Service: Endoscopy;  Laterality: N/A;  9:30 am   POLYPECTOMY  03/12/2016   Procedure: POLYPECTOMY;  Surgeon: Corbin Ade, MD;  Location: [facility];  Service: Endoscopy;;  colon   YAG LASER APPLICATION Left 01/22/2014   Procedure: YAG LASER APPLICATION;  Surgeon: Susa Simmonds, MD;  Location: [facility];  Service: Ophthalmology;  Laterality: Left;   Patient Active Problem List   Diagnosis Date Noted   Immunosuppressed status (HCC) 04/18/2022   Hx of kidney transplant 10/14/2021   Hx of AKA (above knee amputation), left (HCC) 10/14/2021   Diabetic peripheral neuropathy associated with type 2 diabetes  mellitus (HCC) 10/14/2021   Uncontrolled type 2 diabetes mellitus with hyperglycemia, with long-term current use of insulin (HCC) 08/27/2017   Hyperlipidemia associated with type 2 diabetes mellitus (HCC) 03/18/2017   Chronic renal disease, stage V (HCC) 08/20/2015   Personal history of noncompliance with medical treatment, presenting hazards to health 08/20/2015   Proteinuria 07/04/2014   Uncontrolled type 2 diabetes mellitus with ESRD (end-stage renal disease) 05/21/2014   Hyperlipidemia 05/21/2014   HTN (hypertension) 05/21/2014    ONSET DATE: 04/17/2022 MD referral to PT  REFERRING DIAG:  89.612 (ICD-10-CM) - Hx of AKA (above knee amputation), left (HCC)  R53.1 (ICD-10-CM) - Weakness    THERAPY DIAG:  Muscle weakness (generalized)  Other abnormalities of gait and mobility  Decreased functional mobility and endurance  Abnormal posture  Rationale for Evaluation and Treatment: Rehabilitation  SUBJECTIVE:   SUBJECTIVE STATEMENT  she saw eye doctor who did injections in both eyes which limited her activity for next 1 1/2 days.  She did corner exercises & stretches since she saw PT with no issues.   PERTINENT HISTORY: DM2, neuropathy, HTN, CHF, renal CA,CKD st 5,  ESRD, kidney transplant 08/30/2017, Thoracotomy with lobectomy 02/24/2018,   PAIN:  Are you having pain? No: NPRS scale: today 0/10 and no pain since last PT session.  Pain location: left residual limb posteriorly Pain description:  spasm, "toothache" Aggravating factors:  prosthesis wear Relieving factors: remove prosthesis including liner within 5-10 min.   PRECAUTIONS: Other: no BP LUE  WEIGHT BEARING RESTRICTIONS: No  FALLS: Has patient fallen in last 6 months? Yes. Number of falls 4 denies injuries, shakes but not low blood sugar.   LIVING ENVIRONMENT: Lives with: lives with their family sister just moved into pt's home Lives in: House  single level Home Access: Stairs to enter Home layout: One  level Stairs: Yes: External: 4 steps; on left going up Has following equipment at home: Single point cane, Environmental consultant - 2 wheeled, Environmental consultant - 4 wheeled, Wheelchair (manual), [employer], and Grab bars  PLOF: Independent, Independent with household mobility without device, and Independent with community mobility without device prosthesis only for community activities  PATIENT GOALS:   walk in community. Be active without fear of falling.   OBJECTIVE:  COGNITION: Overall cognitive status: Within functional limits for tasks assessed   SENSATION: WFL  POSTURE: rounded shoulders, forward head, flexed trunk , and weight shift right  LOWER EXTREMITY ROM:  ROM P:passive  A:active Right eval Left eval  Hip flexion    Hip extension    Hip abduction    Hip adduction    Hip internal rotation    Hip external rotation    Knee flexion  WFL  Knee extension  WFL  Ankle dorsiflexion    Ankle plantarflexion    Ankle inversion    Ankle eversion     (Blank rows = not tested)  LOWER EXTREMITY MMT:  MMT Right eval Left eval  Hip flexion 5/5 4/5  Hip extension 4/5 4-/5  Hip abduction 4/5 4-/5  Hip adduction    Hip internal rotation    Hip external rotation    Knee flexion 5/5 4/5  Knee extension 5/5 4/5  Ankle dorsiflexion    Ankle plantarflexion    Ankle inversion    Ankle eversion    (Blank rows = not tested)  TRANSFERS: Sit to stand: Modified independence requires UE assist on armrests Stand to sit: Modified independence requires UE assist on armrests  GAIT: 07/27/2022: Functional Gait Assessment: 18/30  [facility] PT Assessment - 07/27/22 1349       Functional Gait  Assessment   Gait assessed  Yes    Gait Level Surface Walks 20 ft in less than 5.5 sec, no assistive devices, good speed, no evidence for imbalance, normal gait pattern, deviates no more than 6 in outside of the 12 in walkway width.    Change in Gait Speed Able to change speed, demonstrates mild gait deviations,  deviates 6-10 in outside of the 12 in walkway width, or no gait deviations, unable to achieve a major change in velocity, or uses a change in velocity, or uses an assistive device.    Gait with Horizontal Head Turns Performs head turns smoothly with slight change in gait velocity (eg, minor disruption to smooth gait path), deviates 6-10 in outside 12 in walkway width, or uses an assistive device.    Gait with Vertical Head Turns Performs task with slight change in gait velocity (eg, minor disruption to smooth gait path), deviates 6 - 10 in outside 12 in walkway width or uses assistive device    Gait and Pivot Turn Pivot turns safely in greater than 3 sec and stops with no loss of balance, or pivot turns safely within 3 sec and stops with mild imbalance, requires small steps to catch balance.    Step Over  Obstacle Is able to step over one shoe box (4.5 in total height) without changing gait speed. No evidence of imbalance.    Gait with Narrow Base of Support Ambulates less than 4 steps heel to toe or cannot perform without assistance.    Gait with Eyes Closed Walks 20 ft, slow speed, abnormal gait pattern, evidence for imbalance, deviates 10-15 in outside 12 in walkway width. Requires more than 9 sec to ambulate 20 ft.    Ambulating Backwards Walks 20 ft, uses assistive device, slower speed, mild gait deviations, deviates 6-10 in outside 12 in walkway width.    Steps Alternating feet, must use rail.    Total Score 18              06/17/2022 6 Minute Walk Test with cane & TTA prosthesis:  Pre HR 75 SpO2 97% no dyspnea  Post HR 95 SpO2 93% dyspnea 7/10 with significant ShOB for 3 min post activity Pt amb 545' in 4 min 30 sec with 3 standing 15 sec rests required.  She was unable to complete the 6 min time.    06/10/2022: gait velocity without AD 3.03 ft/sec  Eval / 05/12/2022:  Gait pattern: step through pattern, decreased arm swing- Left, decreased step length- Right, decreased stance time-  Left, decreased hip/knee flexion- Left, Left hip hike, antalgic, and abducted- Left Distance walked: 120' Assistive device utilized: arrived using rollator walker;  assessed with cane & no device. Level of assistance: cane CGA and no device Min A Gait velocity: 2.51 ft/sec with cane &  2.84 ft/sec no device (MinA)  FUNCTIONAL TESTs:  07/07/2022: 4-square step test with cane 14.88sec with SBA and without cane 13.78 sec with CGA  06/10/2022: 4-square step test with cane 17.98sec with CGA and without cane 20.13 sec with minA  06/08/2022: TUG   with cane standard 12.12 sec & cognitive 12.66 sec  Without device standard 9.69 sec & cognitive 11.00 sec Berg Balance 45/56  Eval / 05/12/2022:  Timed up and go (TUG):  standard 14.40sec & Cognitive 18.69sec   Both with cane. Berg Balance Scale: 38/56    CARDIOVASCULAR RESPONSE: Functional activity: Berg & Gait assessment Pre-activity vitals: HR: 76 SpO2: 94% Post-activity vitals: HR: 84 SpO2: 95%   CURRENT PROSTHETIC WEAR ASSESSMENT:  Patient is independent with: skin check, residual limb care, care of non-amputated limb, prosthetic cleaning, correct ply sock adjustment, and proper wear schedule/adjustment Patient is dependent with: donning & managing edema / limb volume Donning prosthesis: SBA Doffing prosthesis: Modified independence Prosthetic wear tolerance: 10-12 hours of 15-16 awake hours (62-80% of awake hours) limited by pain Prosthetic weight bearing tolerance: 5 minutes limited by fatigue Edema: pitting Residual limb condition: no open areas, no hair growth, normal color, temperature & moisture, cylindrical shape Prosthetic description: silicon gel liner, suction pin sleeve with flexible inner socket supspension, total contact socket, dynamic response foot K code/activity level with prosthetic use: Level 3    TODAY'S TREATMENT:  DATE:  07/27/2022: Therapeutic Exercise: Sit to stand without UE support and stand to sit with light touch 5 reps 2 sets  Prosthetic Training with TTA prosthesis: See FGA above in objective. Amb without assistive device scanning right/left, up/down & diagonals;  eyes open/eyes closed, picking up pace for 3 steps, and stepping over 6" hurdle.  PT reviewed need to reset suspension sleeve after seating limb into socket, may need to adjust ply socks after 1-2 hours wear as pumps fluid out of limb and "itching" with prosthesis removal / use warm wet sock or dry cloth to pat limb /liner dry.  Pt verbalized understanding.    07/21/2022: Neuromuscular Re-ed for balance & Therapeutic Exercise: PT reviewed & updated HEP. See below. PT demo, verbal & HO cues. Pt verbalized & return demo understanding   07/20/2022: Neuromuscular Re-ed: Transitioning floor to foam beam - tandem & sidestepping with light BUE support. PT demo & verbal cues.  Stepping on beam & tapping cone with contralateral LE - 5 reps ea LE with light BUE support. Standing crossways on foam beam hip width stance: head turns right/left, up/down & diagonals 5 reps ea and static eyes closed 10 sec 3 reps.  PT demo & verbal cues on doing at home in corner with chair back in front for safety. Pt verbalized understanding.  Sit to/from stand light touch chair seat 5 reps 2 sets. Picking up washcloth from floor 5 reps.   Pt fatigued after each activity noted above.      HOME EXERCISE PROGRAM: Access Code: [email or web address] URL: [email or web address] Date: 07/21/2022 Prepared by: Vladimir Faster  Exercises Stretches daily - Seated Hamstring Stretch with Strap  - 1 x daily - 7 x weekly - 1 sets - 2-3 reps - 30 seconds hold - standing calf stretch with forefoot on small step or brick  - 1 x daily - 7 x weekly - 1 sets - 2-3 reps - 30 seconds hold - Seated Trunk Rotation Stretch  - 1 x daily - 7 x weekly - 1 sets -  2-3 reps - 30 seconds hold - Supine Quadriceps Stretch with Strap on Table  - 1 x daily - 7 x weekly - 1 sets - 2-3 reps - 30 seconds hold Counter Mondays & Thursdays - Tandem Walking with Counter Support  - 1 x daily - 2 x weekly - 1 sets - 3-5 reps - Backward Walking with Counter Support  - 1 x daily - 2 x weekly - 1 sets - 3-5 reps - Carioca with Counter Support  - 1 x daily - 2 x weekly - 1 sets - 3-5 reps - Standing Tandem Balance with Counter Support  - 1 x daily - 2 x weekly - 1 sets - 1 reps - 50 seconds hold - Standing Rolling Forward & Back, Side to Side and Circles  - 1 x daily -2 x weekly - 1 sets - 10 reps Band exercises with chair back & cane Tuesdays & Fridays - Standing Hip Flexion with Resistance (Mirrored)  - 1 x daily - 2 x weekly - 1 sets - 10 reps - Standing Hip Extension with Resistance  - 1 x daily - 2 x weekly - 1 sets - 10 reps - Standing Hip Adduction with Resistance (Mirrored)  - 1 x daily - 2 x weekly - 1 sets - 10 reps - Standing Hip Abduction with Theraband Resistance  - 1 x daily - 2 x weekly - 1 sets - 10 reps Corner  exercises Wednesdays & Saturdays - feet apart standing on floor with head motions  - 1 x daily - 2 x weekly - 1 sets - 5-10 reps - Wide stance on Foam Pad head movements  - 1 x daily - 2 x weekly - 1 sets - 5-10 reps  Increasing your activity level is important.  Short distances which is walking from one room to another. Work to increase frequency back to prior level.  Medium distances are entering & exiting your home or community with limited distances. Start with 4 medium walks which is one outing to one location and increase number of tolerated amounts per day.  Long walk is walking program Walking Program 3-5 days/week. Can use stepper at [employer] if weather not conducive to walking outdoors.  Use rollator walker to enable resting spot and light upper extremity support. Progress to next level when current level is not challenge or  difficult.  Start walking 5 min, rest 5 min for 2 sets Add 3rd set. So you are walking total of 15 min.  Decrease rest to 4 min. Walk 5 min for 3 sets Walk 6 min, rest 4 min for 3 sets. Decrease rest to 3 min. Walking 6 min for 3 sets Walk 7 min, rest 3 min, 3 sets Walk 10 min, rest 5 min 2 sets Walk 11 min, rest 5 min, 2 sets Walk 12 min, rest 5 min, 2 sets Walk 13 min, rest 5 min, 2 sets Walk 14 min, rest 5 min, 2 sets Walk 15 min, rest 5 min, 2 sets Walk 15 min, rest 4 min, 2 sets Walk 15 min, rest 3 min, 2 sets Walk 15 min, rest 2 min, 2 sets Walk 15 min, rest 1 min, 2 sets Walk 30 min   ASSESSMENT: CLINICAL IMPRESSION:  patient has improved prosthetic gait without device. Functional Gait Assessment <19/30 indicates fall risk but her score of 18/30 is just under cut off. She has improved her understanding of well rounded fitness program geared to improve function.   OBJECTIVE IMPAIRMENTS: Abnormal gait, cardiopulmonary status limiting activity, decreased activity tolerance, decreased balance, decreased endurance, decreased knowledge of use of DME, decreased mobility, difficulty walking, decreased strength, increased edema, postural dysfunction, prosthetic dependency , and pain.   ACTIVITY LIMITATIONS: carrying, lifting, sitting, standing, squatting, stairs, transfers, and locomotion level  PARTICIPATION LIMITATIONS: meal prep, driving, shopping, and community activity  PERSONAL FACTORS: Fitness, Time since onset of injury/illness/exacerbation, and 3+ comorbidities: see PMH  are also affecting patient's functional outcome.   REHAB POTENTIAL: Good  CLINICAL DECISION MAKING: Evolving/moderate complexity  EVALUATION COMPLEXITY: Moderate   GOALS: Goals reviewed with patient? Yes  SHORT TERM GOALS: Target date: 07/09/2022  Patient verbalizes proper adjusting ply socks with limb volume changes. Baseline: SEE OBJECTIVE DATA Goal status: MET 07/02/2022 2.  Patient tolerates  prosthesis >90% awake hrs /day without skin issues or limb pain </= 2/10 after standing. Baseline: SEE OBJECTIVE DATA Goal status:   MET 07/02/2022  3. 4-square test with cane <15 sec safely Baseline: SEE OBJECTIVE DATA Goal status:   MET 07/07/2022  4. Patient ambulates 300' with cane & prosthesis with supervision. Baseline: SEE OBJECTIVE DATA Goal status:   MET 07/02/2022  5. Patient demo & verbalizes understanding of updated HEP Baseline: SEE OBJECTIVE DATA Goal status:   MET 07/02/2022  LONG TERM GOALS: Target date: 08/06/2022  Patient demonstrates & verbalized understanding of prosthetic care to enable safe utilization of prosthesis. Baseline: SEE OBJECTIVE DATA Goal status:  ongoing 06/30/2022  Patient tolerates prosthesis wear >90% of awake hours without skin or limb pain issues. Baseline: SEE OBJECTIVE DATA Goal status:  ongoing 06/30/2022  Berg Balance >46/56 Baseline: SEE OBJECTIVE DATA Goal status:   ongoing 06/30/2022  Patient ambulates >500' with prosthesis and cane or less independently Baseline: SEE OBJECTIVE DATA Goal status:  ongoing 06/30/2022  Patient negotiates ramps, curbs & stairs with single rail with prosthesis and cane or less independently. Baseline: SEE OBJECTIVE DATA Goal status:  ongoing 06/30/2022  Patient verbalizes & demonstrates understanding of how to properly use fitness equipment to return to gym. Baseline: SEE OBJECTIVE DATA Goal status:   ongoing 06/30/2022  PLAN:  PT FREQUENCY: 2x/week  PT DURATION: 12 weeks  PLANNED INTERVENTIONS: Therapeutic exercises, Therapeutic activity, Neuromuscular re-education, Balance training, Gait training, Patient/Family education, Self Care, Stair training, Vestibular training, Canalith repositioning, Prosthetic training, DME instructions, and physical performance testing  PLAN FOR NEXT SESSION:  Continue with balance & gait working towards LTGs.    Vladimir Faster, PT, DPT 07/27/2022, 3:04 PM

## 2022-07-29 ENCOUNTER — Ambulatory Visit (INDEPENDENT_AMBULATORY_CARE_PROVIDER_SITE_OTHER): Payer: Medicare Other | Admitting: Physical Therapy

## 2022-07-29 ENCOUNTER — Encounter: Payer: Self-pay | Admitting: Physical Therapy

## 2022-07-29 DIAGNOSIS — Z7409 Other reduced mobility: Secondary | ICD-10-CM | POA: Diagnosis not present

## 2022-07-29 DIAGNOSIS — R293 Abnormal posture: Secondary | ICD-10-CM | POA: Diagnosis not present

## 2022-07-29 DIAGNOSIS — R2689 Other abnormalities of gait and mobility: Secondary | ICD-10-CM | POA: Diagnosis not present

## 2022-07-29 DIAGNOSIS — M6281 Muscle weakness (generalized): Secondary | ICD-10-CM

## 2022-07-29 DIAGNOSIS — R2681 Unsteadiness on feet: Secondary | ICD-10-CM

## 2022-07-29 NOTE — Therapy (Signed)
OUTPATIENT PHYSICAL THERAPY TREATMENT  Patient Name: Shelley [name] MRN: [id number] DOB:1950-11-03, 72 y.o., female Today's Date: 07/29/2022  PCP: Babs Sciara, MD REFERRING PROVIDER: Babs Sciara, MD  END OF SESSION:  PT End of Session - 07/29/22 1345     Visit Number 21    Number of Visits 25    Date for PT Re-Evaluation 08/06/22    Authorization Type Medicare & BCBS    PT Start Time 1345    PT Stop Time 1424    PT Time Calculation (min) 39 min    Equipment Utilized During Treatment Gait belt    Activity Tolerance Patient tolerated treatment well;Patient limited by fatigue    Behavior During Therapy WFL for tasks assessed/performed                Past Medical History:  Diagnosis Date   Diabetes (HCC)    Diabetes mellitus without complication (HCC)    End stage renal disease (HCC)    HTN (hypertension)    Hyperlipidemia    Hypertension    Past Surgical History:  Procedure Laterality Date   ABDOMINAL HYSTERECTOMY     fibroids/complete hysterec 2000   APPENDECTOMY     CATARACT EXTRACTION Bilateral    COLONOSCOPY N/A 03/12/2016   Procedure: COLONOSCOPY;  Surgeon: Corbin Ade, MD;  Location: [facility];  Service: Endoscopy;  Laterality: N/A;  9:30 am   POLYPECTOMY  03/12/2016   Procedure: POLYPECTOMY;  Surgeon: Corbin Ade, MD;  Location: [facility];  Service: Endoscopy;;  colon   YAG LASER APPLICATION Left 01/22/2014   Procedure: YAG LASER APPLICATION;  Surgeon: Susa Simmonds, MD;  Location: [facility];  Service: Ophthalmology;  Laterality: Left;   Patient Active Problem List   Diagnosis Date Noted   Immunosuppressed status (HCC) 04/18/2022   Hx of kidney transplant 10/14/2021   Hx of AKA (above knee amputation), left (HCC) 10/14/2021   Diabetic peripheral neuropathy associated with type 2 diabetes mellitus (HCC) 10/14/2021   Uncontrolled type 2 diabetes mellitus with hyperglycemia, with long-term current use of insulin (HCC) 08/27/2017    Hyperlipidemia associated with type 2 diabetes mellitus (HCC) 03/18/2017   Chronic renal disease, stage V (HCC) 08/20/2015   Personal history of noncompliance with medical treatment, presenting hazards to health 08/20/2015   Proteinuria 07/04/2014   Uncontrolled type 2 diabetes mellitus with ESRD (end-stage renal disease) 05/21/2014   Hyperlipidemia 05/21/2014   HTN (hypertension) 05/21/2014    ONSET DATE: 04/17/2022 MD referral to PT  REFERRING DIAG:  89.612 (ICD-10-CM) - Hx of AKA (above knee amputation), left (HCC)  R53.1 (ICD-10-CM) - Weakness    THERAPY DIAG:  Muscle weakness (generalized)  Other abnormalities of gait and mobility  Decreased functional mobility and endurance  Abnormal posture  Unsteadiness on feet  Rationale for Evaluation and Treatment: Rehabilitation  SUBJECTIVE:   SUBJECTIVE STATEMENT   She plans to go to her mother's grave to put flowers for Mother's Day. She has to walk on grass & gravel.  Her exercises are going well.   PERTINENT HISTORY: DM2, neuropathy, HTN, CHF, renal CA,CKD st 5,  ESRD, kidney transplant 08/30/2017, Thoracotomy with lobectomy 02/24/2018,   PAIN:  Are you having pain? No: NPRS scale: today 0/10 and no pain since last PT session.  Pain location: left residual limb posteriorly Pain description: spasm, "toothache" Aggravating factors:  prosthesis wear Relieving factors: remove prosthesis including liner within 5-10 min.   PRECAUTIONS: Other: no BP LUE  WEIGHT BEARING  RESTRICTIONS: No  FALLS: Has patient fallen in last 6 months? Yes. Number of falls 4 denies injuries, shakes but not low blood sugar.   LIVING ENVIRONMENT: Lives with: lives with their family sister just moved into pt's home Lives in: House  single level Home Access: Stairs to enter Home layout: One level Stairs: Yes: External: 4 steps; on left going up Has following equipment at home: Single point cane, Environmental consultant - 2 wheeled, Environmental consultant - 4 wheeled, Wheelchair  (manual), [employer], and Grab bars  PLOF: Independent, Independent with household mobility without device, and Independent with community mobility without device prosthesis only for community activities  PATIENT GOALS:   walk in community. Be active without fear of falling.   OBJECTIVE:  COGNITION: Overall cognitive status: Within functional limits for tasks assessed   SENSATION: WFL  POSTURE: rounded shoulders, forward head, flexed trunk , and weight shift right  LOWER EXTREMITY ROM:  ROM P:passive  A:active Right eval Left eval  Hip flexion    Hip extension    Hip abduction    Hip adduction    Hip internal rotation    Hip external rotation    Knee flexion  WFL  Knee extension  WFL  Ankle dorsiflexion    Ankle plantarflexion    Ankle inversion    Ankle eversion     (Blank rows = not tested)  LOWER EXTREMITY MMT:  MMT Right eval Left eval  Hip flexion 5/5 4/5  Hip extension 4/5 4-/5  Hip abduction 4/5 4-/5  Hip adduction    Hip internal rotation    Hip external rotation    Knee flexion 5/5 4/5  Knee extension 5/5 4/5  Ankle dorsiflexion    Ankle plantarflexion    Ankle inversion    Ankle eversion    (Blank rows = not tested)  TRANSFERS: Sit to stand: Modified independence requires UE assist on armrests Stand to sit: Modified independence requires UE assist on armrests  GAIT: 07/27/2022: Functional Gait Assessment: 18/30  06/17/2022 6 Minute Walk Test with cane & TTA prosthesis:  Pre HR 75 SpO2 97% no dyspnea  Post HR 95 SpO2 93% dyspnea 7/10 with significant ShOB for 3 min post activity Pt amb 545' in 4 min 30 sec with 3 standing 15 sec rests required.  She was unable to complete the 6 min time.    06/10/2022: gait velocity without AD 3.03 ft/sec  Eval / 05/12/2022:  Gait pattern: step through pattern, decreased arm swing- Left, decreased step length- Right, decreased stance time- Left, decreased hip/knee flexion- Left, Left hip hike, antalgic,  and abducted- Left Distance walked: 120' Assistive device utilized: arrived using rollator walker;  assessed with cane & no device. Level of assistance: cane CGA and no device Min A Gait velocity: 2.51 ft/sec with cane &  2.84 ft/sec no device (MinA)  FUNCTIONAL TESTs:  07/07/2022: 4-square step test with cane 14.88sec with SBA and without cane 13.78 sec with CGA  06/10/2022: 4-square step test with cane 17.98sec with CGA and without cane 20.13 sec with minA  06/08/2022: TUG   with cane standard 12.12 sec & cognitive 12.66 sec  Without device standard 9.69 sec & cognitive 11.00 sec Berg Balance 45/56  Eval / 05/12/2022:  Timed up and go (TUG):  standard 14.40sec & Cognitive 18.69sec   Both with cane. Berg Balance Scale: 38/56    CARDIOVASCULAR RESPONSE: Functional activity: Berg & Gait assessment Pre-activity vitals: HR: 76 SpO2: 94% Post-activity vitals: HR: 84 SpO2: 95%  CURRENT PROSTHETIC WEAR ASSESSMENT:  Patient is independent with: skin check, residual limb care, care of non-amputated limb, prosthetic cleaning, correct ply sock adjustment, and proper wear schedule/adjustment Patient is dependent with: donning & managing edema / limb volume Donning prosthesis: SBA Doffing prosthesis: Modified independence Prosthetic wear tolerance: 10-12 hours of 15-16 awake hours (62-80% of awake hours) limited by pain Prosthetic weight bearing tolerance: 5 minutes limited by fatigue Edema: pitting Residual limb condition: no open areas, no hair growth, normal color, temperature & moisture, cylindrical shape Prosthetic description: silicon gel liner, suction pin sleeve with flexible inner socket supspension, total contact socket, dynamic response foot K code/activity level with prosthetic use: Level 3    TODAY'S TREATMENT:                                                                                                                             DATE:  07/29/2022: Prosthetic Training  with TTA prosthesis: PT reviewed drying limb & liner if notices sweat. PT recommends not leaving prosthesis off to enable skin to cool off. Pt verbalized understanding.  PT recommended using rollator walker with light pressure on BUEs to walk to her mother's grave.  Pt verbalized understanding. Pt neg 11 steps with right rail & LUE cane step to pattern but alternating lead LE. PT recommended using this technique with stairs to develop functional strength BLEs. Pt verbalized understanding.  Pt able to step over obstacle with & without cane with supervision.  Pt amb 100' X 2 without device safely.   Neuromuscular Re-education: Stepping laterally on & off foam beam with minA for balance. Sidestepping along foam beam with modA. Stepping on & over foam beam with each LE 3 reps with cane & 3 reps without device with supervision.    07/27/2022: Therapeutic Exercise: Sit to stand without UE support and stand to sit with light touch 5 reps 2 sets  Prosthetic Training with TTA prosthesis: See FGA above in objective. Amb without assistive device scanning right/left, up/down & diagonals;  eyes open/eyes closed, picking up pace for 3 steps, and stepping over 6" hurdle.  PT reviewed need to reset suspension sleeve after seating limb into socket, may need to adjust ply socks after 1-2 hours wear as pumps fluid out of limb and "itching" with prosthesis removal / use warm wet sock or dry cloth to pat limb /liner dry.  Pt verbalized understanding.    07/21/2022: Neuromuscular Re-ed for balance & Therapeutic Exercise: PT reviewed & updated HEP. See below. PT demo, verbal & HO cues. Pt verbalized & return demo understanding     HOME EXERCISE PROGRAM: Access Code: [email or web address] URL: [email or web address] Date: 07/21/2022 Prepared by: Vladimir Faster  Exercises Stretches daily - Seated Hamstring Stretch with Strap  - 1 x daily - 7 x weekly - 1 sets - 2-3 reps - 30 seconds hold - standing calf  stretch with forefoot on small step or brick  - 1 x daily - 7 x  weekly - 1 sets - 2-3 reps - 30 seconds hold - Seated Trunk Rotation Stretch  - 1 x daily - 7 x weekly - 1 sets - 2-3 reps - 30 seconds hold - Supine Quadriceps Stretch with Strap on Table  - 1 x daily - 7 x weekly - 1 sets - 2-3 reps - 30 seconds hold Counter Mondays & Thursdays - Tandem Walking with Counter Support  - 1 x daily - 2 x weekly - 1 sets - 3-5 reps - Backward Walking with Counter Support  - 1 x daily - 2 x weekly - 1 sets - 3-5 reps - Carioca with Counter Support  - 1 x daily - 2 x weekly - 1 sets - 3-5 reps - Standing Tandem Balance with Counter Support  - 1 x daily - 2 x weekly - 1 sets - 1 reps - 50 seconds hold - Standing Rolling Forward & Back, Side to Side and Circles  - 1 x daily -2 x weekly - 1 sets - 10 reps Band exercises with chair back & cane Tuesdays & Fridays - Standing Hip Flexion with Resistance (Mirrored)  - 1 x daily - 2 x weekly - 1 sets - 10 reps - Standing Hip Extension with Resistance  - 1 x daily - 2 x weekly - 1 sets - 10 reps - Standing Hip Adduction with Resistance (Mirrored)  - 1 x daily - 2 x weekly - 1 sets - 10 reps - Standing Hip Abduction with Theraband Resistance  - 1 x daily - 2 x weekly - 1 sets - 10 reps Corner exercises Wednesdays & Saturdays - feet apart standing on floor with head motions  - 1 x daily - 2 x weekly - 1 sets - 5-10 reps - Wide stance on Foam Pad head movements  - 1 x daily - 2 x weekly - 1 sets - 5-10 reps  Increasing your activity level is important.  Short distances which is walking from one room to another. Work to increase frequency back to prior level.  Medium distances are entering & exiting your home or community with limited distances. Start with 4 medium walks which is one outing to one location and increase number of tolerated amounts per day.  Long walk is walking program Walking Program 3-5 days/week. Can use stepper at [employer] if weather not  conducive to walking outdoors.  Use rollator walker to enable resting spot and light upper extremity support. Progress to next level when current level is not challenge or difficult.  Start walking 5 min, rest 5 min for 2 sets Add 3rd set. So you are walking total of 15 min.  Decrease rest to 4 min. Walk 5 min for 3 sets Walk 6 min, rest 4 min for 3 sets. Decrease rest to 3 min. Walking 6 min for 3 sets Walk 7 min, rest 3 min, 3 sets Walk 10 min, rest 5 min 2 sets Walk 11 min, rest 5 min, 2 sets Walk 12 min, rest 5 min, 2 sets Walk 13 min, rest 5 min, 2 sets Walk 14 min, rest 5 min, 2 sets Walk 15 min, rest 5 min, 2 sets Walk 15 min, rest 4 min, 2 sets Walk 15 min, rest 3 min, 2 sets Walk 15 min, rest 2 min, 2 sets Walk 15 min, rest 1 min, 2 sets Walk 30 min   ASSESSMENT: CLINICAL IMPRESSION: patient appears on target to meet LTGs next week at end of POC.  Her balance & functional mobility has improved significantly.   OBJECTIVE IMPAIRMENTS: Abnormal gait, cardiopulmonary status limiting activity, decreased activity tolerance, decreased balance, decreased endurance, decreased knowledge of use of DME, decreased mobility, difficulty walking, decreased strength, increased edema, postural dysfunction, prosthetic dependency , and pain.   ACTIVITY LIMITATIONS: carrying, lifting, sitting, standing, squatting, stairs, transfers, and locomotion level  PARTICIPATION LIMITATIONS: meal prep, driving, shopping, and community activity  PERSONAL FACTORS: Fitness, Time since onset of injury/illness/exacerbation, and 3+ comorbidities: see PMH  are also affecting patient's functional outcome.   REHAB POTENTIAL: Good  CLINICAL DECISION MAKING: Evolving/moderate complexity  EVALUATION COMPLEXITY: Moderate   GOALS: Goals reviewed with patient? Yes  SHORT TERM GOALS: Target date: 07/09/2022  Patient verbalizes proper adjusting ply socks with limb volume changes. Baseline: SEE OBJECTIVE  DATA Goal status: MET 07/02/2022 2.  Patient tolerates prosthesis >90% awake hrs /day without skin issues or limb pain </= 2/10 after standing. Baseline: SEE OBJECTIVE DATA Goal status:   MET 07/02/2022  3. 4-square test with cane <15 sec safely Baseline: SEE OBJECTIVE DATA Goal status:   MET 07/07/2022  4. Patient ambulates 300' with cane & prosthesis with supervision. Baseline: SEE OBJECTIVE DATA Goal status:   MET 07/02/2022  5. Patient demo & verbalizes understanding of updated HEP Baseline: SEE OBJECTIVE DATA Goal status:   MET 07/02/2022  LONG TERM GOALS: Target date: 08/06/2022  Patient demonstrates & verbalized understanding of prosthetic care to enable safe utilization of prosthesis. Baseline: SEE OBJECTIVE DATA Goal status:   ongoing 06/30/2022  Patient tolerates prosthesis wear >90% of awake hours without skin or limb pain issues. Baseline: SEE OBJECTIVE DATA Goal status:  ongoing 06/30/2022  Berg Balance >46/56 Baseline: SEE OBJECTIVE DATA Goal status:   ongoing 06/30/2022  Patient ambulates >500' with prosthesis and cane or less independently Baseline: SEE OBJECTIVE DATA Goal status:  ongoing 06/30/2022  Patient negotiates ramps, curbs & stairs with single rail with prosthesis and cane or less independently. Baseline: SEE OBJECTIVE DATA Goal status:  ongoing 06/30/2022  Patient verbalizes & demonstrates understanding of how to properly use fitness equipment to return to gym. Baseline: SEE OBJECTIVE DATA Goal status:   ongoing 06/30/2022  PLAN:  PT FREQUENCY: 2x/week  PT DURATION: 12 weeks  PLANNED INTERVENTIONS: Therapeutic exercises, Therapeutic activity, Neuromuscular re-education, Balance training, Gait training, Patient/Family education, Self Care, Stair training, Vestibular training, Canalith repositioning, Prosthetic training, DME instructions, and physical performance testing  PLAN FOR NEXT SESSION:  begin to check LTGs with plan to discharge next week.  Continue  with balance & gait working towards LTGs.    Vladimir Faster, PT, DPT 07/29/2022, 2:26 PM

## 2022-08-03 ENCOUNTER — Ambulatory Visit (INDEPENDENT_AMBULATORY_CARE_PROVIDER_SITE_OTHER): Payer: Medicare Other | Admitting: Physical Therapy

## 2022-08-03 ENCOUNTER — Encounter: Payer: Self-pay | Admitting: Physical Therapy

## 2022-08-03 DIAGNOSIS — R293 Abnormal posture: Secondary | ICD-10-CM | POA: Diagnosis not present

## 2022-08-03 DIAGNOSIS — Z7409 Other reduced mobility: Secondary | ICD-10-CM

## 2022-08-03 DIAGNOSIS — R2681 Unsteadiness on feet: Secondary | ICD-10-CM

## 2022-08-03 DIAGNOSIS — R2689 Other abnormalities of gait and mobility: Secondary | ICD-10-CM

## 2022-08-03 DIAGNOSIS — M6281 Muscle weakness (generalized): Secondary | ICD-10-CM | POA: Diagnosis not present

## 2022-08-03 NOTE — Therapy (Signed)
OUTPATIENT PHYSICAL THERAPY TREATMENT  Patient Name: Shelley [name] MRN: [id number] DOB:February 13, 1951, 72 y.o., female Today's Date: 08/03/2022  PCP: Babs Sciara, MD REFERRING PROVIDER: Babs Sciara, MD  END OF SESSION:  PT End of Session - 08/03/22 1346     Visit Number 22    Number of Visits 25    Date for PT Re-Evaluation 08/06/22    Authorization Type Medicare & BCBS    PT Start Time 1345    PT Stop Time 1428    PT Time Calculation (min) 43 min    Equipment Utilized During Treatment Gait belt    Activity Tolerance Patient tolerated treatment well;Patient limited by fatigue    Behavior During Therapy WFL for tasks assessed/performed                Past Medical History:  Diagnosis Date   Diabetes (HCC)    Diabetes mellitus without complication (HCC)    End stage renal disease (HCC)    HTN (hypertension)    Hyperlipidemia    Hypertension    Past Surgical History:  Procedure Laterality Date   ABDOMINAL HYSTERECTOMY     fibroids/complete hysterec 2000   APPENDECTOMY     CATARACT EXTRACTION Bilateral    COLONOSCOPY N/A 03/12/2016   Procedure: COLONOSCOPY;  Surgeon: Corbin Ade, MD;  Location: [facility];  Service: Endoscopy;  Laterality: N/A;  9:30 am   POLYPECTOMY  03/12/2016   Procedure: POLYPECTOMY;  Surgeon: Corbin Ade, MD;  Location: [facility];  Service: Endoscopy;;  colon   YAG LASER APPLICATION Left 01/22/2014   Procedure: YAG LASER APPLICATION;  Surgeon: Susa Simmonds, MD;  Location: [facility];  Service: Ophthalmology;  Laterality: Left;   Patient Active Problem List   Diagnosis Date Noted   Immunosuppressed status (HCC) 04/18/2022   Hx of kidney transplant 10/14/2021   Hx of AKA (above knee amputation), left (HCC) 10/14/2021   Diabetic peripheral neuropathy associated with type 2 diabetes mellitus (HCC) 10/14/2021   Uncontrolled type 2 diabetes mellitus with hyperglycemia, with long-term current use of insulin (HCC) 08/27/2017    Hyperlipidemia associated with type 2 diabetes mellitus (HCC) 03/18/2017   Chronic renal disease, stage V (HCC) 08/20/2015   Personal history of noncompliance with medical treatment, presenting hazards to health 08/20/2015   Proteinuria 07/04/2014   Uncontrolled type 2 diabetes mellitus with ESRD (end-stage renal disease) 05/21/2014   Hyperlipidemia 05/21/2014   HTN (hypertension) 05/21/2014    ONSET DATE: 04/17/2022 MD referral to PT  REFERRING DIAG:  89.612 (ICD-10-CM) - Hx of AKA (above knee amputation), left (HCC)  R53.1 (ICD-10-CM) - Weakness    THERAPY DIAG:  Muscle weakness (generalized)  Other abnormalities of gait and mobility  Decreased functional mobility and endurance  Abnormal posture  Unsteadiness on feet  Rationale for Evaluation and Treatment: Rehabilitation  SUBJECTIVE:   SUBJECTIVE STATEMENT   She went to her mother's grave but did not walk more due to disagreement with sister than ability.  She went to gym Saturday doing recumbent stepper & UE machines with no issues.   PERTINENT HISTORY: DM2, neuropathy, HTN, CHF, renal CA,CKD st 5,  ESRD, kidney transplant 08/30/2017, Thoracotomy with lobectomy 02/24/2018,   PAIN:  Are you having pain? No: NPRS scale: today 0/10 and no pain since last PT session.  Pain location: left residual limb posteriorly Pain description: spasm, "toothache" Aggravating factors:  prosthesis wear Relieving factors: remove prosthesis including liner within 5-10 min.   PRECAUTIONS: Other: no  BP LUE  WEIGHT BEARING RESTRICTIONS: No  FALLS: Has patient fallen in last 6 months? Yes. Number of falls 4 denies injuries, shakes but not low blood sugar.   LIVING ENVIRONMENT: Lives with: lives with their family sister just moved into pt's home Lives in: House  single level Home Access: Stairs to enter Home layout: One level Stairs: Yes: External: 4 steps; on left going up Has following equipment at home: Single point cane, Environmental consultant - 2  wheeled, Environmental consultant - 4 wheeled, Wheelchair (manual), [employer], and Grab bars  PLOF: Independent, Independent with household mobility without device, and Independent with community mobility without device prosthesis only for community activities  PATIENT GOALS:   walk in community. Be active without fear of falling.   OBJECTIVE:  COGNITION: Overall cognitive status: Within functional limits for tasks assessed   SENSATION: WFL  POSTURE: rounded shoulders, forward head, flexed trunk , and weight shift right  LOWER EXTREMITY ROM:  ROM P:passive  A:active Right eval Left eval  Hip flexion    Hip extension    Hip abduction    Hip adduction    Hip internal rotation    Hip external rotation    Knee flexion  WFL  Knee extension  WFL  Ankle dorsiflexion    Ankle plantarflexion    Ankle inversion    Ankle eversion     (Blank rows = not tested)  LOWER EXTREMITY MMT:  MMT Right eval Left eval  Hip flexion 5/5 4/5  Hip extension 4/5 4-/5  Hip abduction 4/5 4-/5  Hip adduction    Hip internal rotation    Hip external rotation    Knee flexion 5/5 4/5  Knee extension 5/5 4/5  Ankle dorsiflexion    Ankle plantarflexion    Ankle inversion    Ankle eversion    (Blank rows = not tested)  TRANSFERS: Sit to stand: Modified independence requires UE assist on armrests Stand to sit: Modified independence requires UE assist on armrests  GAIT: 08/03/2022: max tolerable distance without device 350' modified independent.  07/27/2022: Functional Gait Assessment: 18/30  06/17/2022 6 Minute Walk Test with cane & TTA prosthesis:  Pre HR 75 SpO2 97% no dyspnea  Post HR 95 SpO2 93% dyspnea 7/10 with significant ShOB for 3 min post activity Pt amb 545' in 4 min 30 sec with 3 standing 15 sec rests required.  She was unable to complete the 6 min time.    06/10/2022: gait velocity without AD 3.03 ft/sec  Eval / 05/12/2022:  Gait pattern: step through pattern, decreased arm swing- Left,  decreased step length- Right, decreased stance time- Left, decreased hip/knee flexion- Left, Left hip hike, antalgic, and abducted- Left Distance walked: 120' Assistive device utilized: arrived using rollator walker;  assessed with cane & no device. Level of assistance: cane CGA and no device Min A Gait velocity: 2.51 ft/sec with cane &  2.84 ft/sec no device (MinA)  FUNCTIONAL TESTs:  08/03/2022: Sharlene Motts Balance 53/56  [facility] PT Assessment - 08/03/22 1345       Standardized Balance Assessment   Standardized Balance Assessment Berg Balance Test      Berg Balance Test   Sit to Stand Able to stand without using hands and stabilize independently    Standing Unsupported Able to stand safely 2 minutes    Sitting with Back Unsupported but Feet Supported on Floor or Stool Able to sit safely and securely 2 minutes    Stand to Sit Sits safely with minimal use  of hands    Transfers Able to transfer safely, minor use of hands    Standing Unsupported with Eyes Closed Able to stand 10 seconds safely    Standing Unsupported with Feet Together Able to place feet together independently and stand 1 minute safely    From Standing, Reach Forward with Outstretched Arm Can reach confidently >25 cm (10")    From Standing Position, Pick up Object from Floor Able to pick up shoe safely and easily    From Standing Position, Turn to Look Behind Over each Shoulder Looks behind from both sides and weight shifts well    Turn 360 Degrees Able to turn 360 degrees safely in 4 seconds or less    Standing Unsupported, Alternately Place Feet on Step/Stool Able to stand independently and safely and complete 8 steps in 20 seconds    Standing Unsupported, One Foot in Front Able to plae foot ahead of the other independently and hold 30 seconds    Standing on One Leg Able to lift leg independently and hold equal to or more than 3 seconds    Total Score 53    Berg comment: BERG  < 36 high risk for falls (close to 100%) 46-51  moderate (>50%)   37-45 significant (>80%) 52-55 lower (> 25%)             07/07/2022: 4-square step test with cane 14.88sec with SBA and without cane 13.78 sec with CGA  06/10/2022: 4-square step test with cane 17.98sec with CGA and without cane 20.13 sec with minA  06/08/2022: TUG   with cane standard 12.12 sec & cognitive 12.66 sec  Without device standard 9.69 sec & cognitive 11.00 sec Berg Balance 45/56  Eval / 05/12/2022:  Timed up and go (TUG):  standard 14.40sec & Cognitive 18.69sec   Both with cane. Berg Balance Scale: 38/56    CARDIOVASCULAR RESPONSE: Functional activity: Berg & Gait assessment Pre-activity vitals: HR: 76 SpO2: 94% Post-activity vitals: HR: 84 SpO2: 95%   CURRENT PROSTHETIC WEAR ASSESSMENT:  08/03/2022: Pt is independent is all aspects of prosthetic care. She reports wearing prosthesis all awake hours without issues.   Eval / 05/12/2022:  Patient is independent with: skin check, residual limb care, care of non-amputated limb, prosthetic cleaning, correct ply sock adjustment, and proper wear schedule/adjustment Patient is dependent with: donning & managing edema / limb volume Donning prosthesis: SBA Doffing prosthesis: Modified independence Prosthetic wear tolerance: 10-12 hours of 15-16 awake hours (62-80% of awake hours) limited by pain Prosthetic weight bearing tolerance: 5 minutes limited by fatigue Edema: pitting Residual limb condition: no open areas, no hair growth, normal color, temperature & moisture, cylindrical shape Prosthetic description: silicon gel liner, suction pin sleeve with flexible inner socket supspension, total contact socket, dynamic response foot K code/activity level with prosthetic use: Level 3    TODAY'S TREATMENT:  DATE:  08/03/2022: Prosthetic Training with TTA prosthesis: See objective data  above. Stepping over foam beam without device 6 reps safely. Patient ambulated 75 feet carrying 20 pound kettle bell safely.   Neuromuscular Re-education: See Objective data for Berg 53/56 Stepping laterally on & off foam beam with touch //bar for balance. Sidestepping along foam beam with intermittent touch //bars.  Stepping on & over foam beam with each LE 3 reps inside //bars 3 reps without device with supervision.  07/29/2022: Prosthetic Training with TTA prosthesis: PT reviewed drying limb & liner if notices sweat. PT recommends not leaving prosthesis off to enable skin to cool off. Pt verbalized understanding.  PT recommended using rollator walker with light pressure on BUEs to walk to her mother's grave.  Pt verbalized understanding. Pt neg 11 steps with right rail & LUE cane step to pattern but alternating lead LE. PT recommended using this technique with stairs to develop functional strength BLEs. Pt verbalized understanding.  Pt able to step over obstacle with & without cane with supervision.  Pt amb 100' X 2 without device safely.   Neuromuscular Re-education: Stepping laterally on & off foam beam with minA for balance. Sidestepping along foam beam with modA. Stepping on & over foam beam with each LE 3 reps with cane & 3 reps without device with supervision.    07/27/2022: Therapeutic Exercise: Sit to stand without UE support and stand to sit with light touch 5 reps 2 sets  Prosthetic Training with TTA prosthesis: See FGA above in objective. Amb without assistive device scanning right/left, up/down & diagonals;  eyes open/eyes closed, picking up pace for 3 steps, and stepping over 6" hurdle.  PT reviewed need to reset suspension sleeve after seating limb into socket, may need to adjust ply socks after 1-2 hours wear as pumps fluid out of limb and "itching" with prosthesis removal / use warm wet sock or dry cloth to pat limb /liner dry.  Pt verbalized understanding.     HOME  EXERCISE PROGRAM: Access Code: [email or web address] URL: [email or web address] Date: 07/21/2022 Prepared by: Vladimir Faster  Exercises Stretches daily - Seated Hamstring Stretch with Strap  - 1 x daily - 7 x weekly - 1 sets - 2-3 reps - 30 seconds hold - standing calf stretch with forefoot on small step or brick  - 1 x daily - 7 x weekly - 1 sets - 2-3 reps - 30 seconds hold - Seated Trunk Rotation Stretch  - 1 x daily - 7 x weekly - 1 sets - 2-3 reps - 30 seconds hold - Supine Quadriceps Stretch with Strap on Table  - 1 x daily - 7 x weekly - 1 sets - 2-3 reps - 30 seconds hold Counter Mondays & Thursdays - Tandem Walking with Counter Support  - 1 x daily - 2 x weekly - 1 sets - 3-5 reps - Backward Walking with Counter Support  - 1 x daily - 2 x weekly - 1 sets - 3-5 reps - Carioca with Counter Support  - 1 x daily - 2 x weekly - 1 sets - 3-5 reps - Standing Tandem Balance with Counter Support  - 1 x daily - 2 x weekly - 1 sets - 1 reps - 50 seconds hold - Standing Rolling Forward & Back, Side to Side and Circles  - 1 x daily -2 x weekly - 1 sets - 10 reps Band exercises with chair back & cane Tuesdays & Fridays - Standing Hip Flexion with Resistance (  Mirrored)  - 1 x daily - 2 x weekly - 1 sets - 10 reps - Standing Hip Extension with Resistance  - 1 x daily - 2 x weekly - 1 sets - 10 reps - Standing Hip Adduction with Resistance (Mirrored)  - 1 x daily - 2 x weekly - 1 sets - 10 reps - Standing Hip Abduction with Theraband Resistance  - 1 x daily - 2 x weekly - 1 sets - 10 reps Corner exercises Wednesdays & Saturdays - feet apart standing on floor with head motions  - 1 x daily - 2 x weekly - 1 sets - 5-10 reps - Wide stance on Foam Pad head movements  - 1 x daily - 2 x weekly - 1 sets - 5-10 reps  Increasing your activity level is important.  Short distances which is walking from one room to another. Work to increase frequency back to prior level.  Medium distances are entering &  exiting your home or community with limited distances. Start with 4 medium walks which is one outing to one location and increase number of tolerated amounts per day.  Long walk is walking program Walking Program 3-5 days/week. Can use stepper at [employer] if weather not conducive to walking outdoors.  Use rollator walker to enable resting spot and light upper extremity support. Progress to next level when current level is not challenge or difficult.  Start walking 5 min, rest 5 min for 2 sets Add 3rd set. So you are walking total of 15 min.  Decrease rest to 4 min. Walk 5 min for 3 sets Walk 6 min, rest 4 min for 3 sets. Decrease rest to 3 min. Walking 6 min for 3 sets Walk 7 min, rest 3 min, 3 sets Walk 10 min, rest 5 min 2 sets Walk 11 min, rest 5 min, 2 sets Walk 12 min, rest 5 min, 2 sets Walk 13 min, rest 5 min, 2 sets Walk 14 min, rest 5 min, 2 sets Walk 15 min, rest 5 min, 2 sets Walk 15 min, rest 4 min, 2 sets Walk 15 min, rest 3 min, 2 sets Walk 15 min, rest 2 min, 2 sets Walk 15 min, rest 1 min, 2 sets Walk 30 min   ASSESSMENT: CLINICAL IMPRESSION: Patient's Berg balance score has improved to 53/56 indicating less than 25% risk of falls.  Patient is able to ambulate up to 350 feet without an assistive device.  She is able to ambulate even greater community distances with a cane or rollator walker.  Patient appears on target to make remaining LTG's next session.  OBJECTIVE IMPAIRMENTS: Abnormal gait, cardiopulmonary status limiting activity, decreased activity tolerance, decreased balance, decreased endurance, decreased knowledge of use of DME, decreased mobility, difficulty walking, decreased strength, increased edema, postural dysfunction, prosthetic dependency , and pain.   ACTIVITY LIMITATIONS: carrying, lifting, sitting, standing, squatting, stairs, transfers, and locomotion level  PARTICIPATION LIMITATIONS: meal prep, driving, shopping, and community  activity  PERSONAL FACTORS: Fitness, Time since onset of injury/illness/exacerbation, and 3+ comorbidities: see PMH  are also affecting patient's functional outcome.   REHAB POTENTIAL: Good  CLINICAL DECISION MAKING: Evolving/moderate complexity  EVALUATION COMPLEXITY: Moderate   GOALS: Goals reviewed with patient? Yes  SHORT TERM GOALS: Target date: 07/09/2022  Patient verbalizes proper adjusting ply socks with limb volume changes. Baseline: SEE OBJECTIVE DATA Goal status: MET 07/02/2022 2.  Patient tolerates prosthesis >90% awake hrs /day without skin issues or limb pain </= 2/10 after standing. Baseline:  SEE OBJECTIVE DATA Goal status:   MET 07/02/2022  3. 4-square test with cane <15 sec safely Baseline: SEE OBJECTIVE DATA Goal status:   MET 07/07/2022  4. Patient ambulates 300' with cane & prosthesis with supervision. Baseline: SEE OBJECTIVE DATA Goal status:   MET 07/02/2022  5. Patient demo & verbalizes understanding of updated HEP Baseline: SEE OBJECTIVE DATA Goal status:   MET 07/02/2022  LONG TERM GOALS: Target date: 08/06/2022  Patient demonstrates & verbalized understanding of prosthetic care to enable safe utilization of prosthesis. Baseline: SEE OBJECTIVE DATA Goal status:   MET 08/03/2022  Patient tolerates prosthesis wear >90% of awake hours without skin or limb pain issues. Baseline: SEE OBJECTIVE DATA Goal status:   MET 08/03/2022  Berg Balance >46/56 Baseline: SEE OBJECTIVE DATA Goal status:    MET 08/03/2022  Patient ambulates >500' with prosthesis and cane or less independently Baseline: SEE OBJECTIVE DATA Goal status:  ongoing 06/30/2022  Patient negotiates ramps, curbs & stairs with single rail with prosthesis and cane or less independently. Baseline: SEE OBJECTIVE DATA Goal status:  ongoing 06/30/2022  Patient verbalizes & demonstrates understanding of how to properly use fitness equipment to return to gym. Baseline: SEE OBJECTIVE DATA Goal status:    ongoing 06/30/2022  PLAN:  PT FREQUENCY: 2x/week  PT DURATION: 12 weeks  PLANNED INTERVENTIONS: Therapeutic exercises, Therapeutic activity, Neuromuscular re-education, Balance training, Gait training, Patient/Family education, Self Care, Stair training, Vestibular training, Canalith repositioning, Prosthetic training, DME instructions, and physical performance testing  PLAN FOR NEXT SESSION: Check remaining long-term goals and discharge  Vladimir Faster, PT, DPT 08/03/2022, 5:28 PM

## 2022-08-05 ENCOUNTER — Ambulatory Visit (INDEPENDENT_AMBULATORY_CARE_PROVIDER_SITE_OTHER): Payer: Medicare Other | Admitting: Physical Therapy

## 2022-08-05 ENCOUNTER — Encounter: Payer: Self-pay | Admitting: Physical Therapy

## 2022-08-05 DIAGNOSIS — M6281 Muscle weakness (generalized): Secondary | ICD-10-CM

## 2022-08-05 DIAGNOSIS — R2689 Other abnormalities of gait and mobility: Secondary | ICD-10-CM | POA: Diagnosis not present

## 2022-08-05 DIAGNOSIS — Z7409 Other reduced mobility: Secondary | ICD-10-CM | POA: Diagnosis not present

## 2022-08-05 DIAGNOSIS — R293 Abnormal posture: Secondary | ICD-10-CM | POA: Diagnosis not present

## 2022-08-05 DIAGNOSIS — R2681 Unsteadiness on feet: Secondary | ICD-10-CM

## 2022-08-05 NOTE — Therapy (Signed)
OUTPATIENT PHYSICAL THERAPY TREATMENT & DISCHARGE SUMMARY  Patient Name: Shelley [name] MRN: [id number] DOB:01-24-1951, 72 y.o., female Today's Date: 08/05/2022  PCP: Babs Sciara, MD REFERRING PROVIDER: Babs Sciara, MD  PHYSICAL THERAPY DISCHARGE SUMMARY  Visits from Start of Care: 23  Current functional level related to goals / functional outcomes: All LTGs MET. See below.   Remaining deficits: Patient continues to have limited endurance but verbalized understanding of fitness program that addresses this issue.   Berg Balance & Functional Gait Assessment indicate low fall risk.    Education / Equipment: Patient was instructed in ongoing well rounded fitness plan which she appears to understand.    Patient agrees to discharge. Patient goals were met. Patient is being discharged due to meeting the stated rehab goals.   END OF SESSION:  PT End of Session - 08/05/22 1345     Visit Number 23    Number of Visits 25    Date for PT Re-Evaluation 08/06/22    Authorization Type Medicare & BCBS    PT Start Time 1344    Equipment Utilized During Treatment Gait belt    Activity Tolerance Patient tolerated treatment well;Patient limited by fatigue    Behavior During Therapy WFL for tasks assessed/performed                Past Medical History:  Diagnosis Date   Diabetes (HCC)    Diabetes mellitus without complication (HCC)    End stage renal disease (HCC)    HTN (hypertension)    Hyperlipidemia    Hypertension    Past Surgical History:  Procedure Laterality Date   ABDOMINAL HYSTERECTOMY     fibroids/complete hysterec 2000   APPENDECTOMY     CATARACT EXTRACTION Bilateral    COLONOSCOPY N/A 03/12/2016   Procedure: COLONOSCOPY;  Surgeon: Corbin Ade, MD;  Location: [facility];  Service: Endoscopy;  Laterality: N/A;  9:30 am   POLYPECTOMY  03/12/2016   Procedure: POLYPECTOMY;  Surgeon: Corbin Ade, MD;  Location: [facility];  Service: Endoscopy;;   colon   YAG LASER APPLICATION Left 01/22/2014   Procedure: YAG LASER APPLICATION;  Surgeon: Susa Simmonds, MD;  Location: [facility];  Service: Ophthalmology;  Laterality: Left;   Patient Active Problem List   Diagnosis Date Noted   Immunosuppressed status (HCC) 04/18/2022   Hx of kidney transplant 10/14/2021   Hx of AKA (above knee amputation), left (HCC) 10/14/2021   Diabetic peripheral neuropathy associated with type 2 diabetes mellitus (HCC) 10/14/2021   Uncontrolled type 2 diabetes mellitus with hyperglycemia, with long-term current use of insulin (HCC) 08/27/2017   Hyperlipidemia associated with type 2 diabetes mellitus (HCC) 03/18/2017   Chronic renal disease, stage V (HCC) 08/20/2015   Personal history of noncompliance with medical treatment, presenting hazards to health 08/20/2015   Proteinuria 07/04/2014   Uncontrolled type 2 diabetes mellitus with ESRD (end-stage renal disease) 05/21/2014   Hyperlipidemia 05/21/2014   HTN (hypertension) 05/21/2014    ONSET DATE: 04/17/2022 MD referral to PT  REFERRING DIAG:  89.612 (ICD-10-CM) - Hx of AKA (above knee amputation), left (HCC)  R53.1 (ICD-10-CM) - Weakness    THERAPY DIAG:  Muscle weakness (generalized)  Other abnormalities of gait and mobility  Decreased functional mobility and endurance  Abnormal posture  Unsteadiness on feet  Rationale for Evaluation and Treatment: Rehabilitation  SUBJECTIVE:   SUBJECTIVE STATEMENT  She feels comfortable with current level of function.    PERTINENT HISTORY: DM2, neuropathy, HTN,  CHF, renal CA,CKD st 5,  ESRD, kidney transplant 08/30/2017, Thoracotomy with lobectomy 02/24/2018,   PAIN:  Are you having pain? No: NPRS scale: today 0/10 and no pain since last PT session.  Pain location: left residual limb posteriorly Pain description: spasm, "toothache" Aggravating factors:  prosthesis wear Relieving factors: remove prosthesis including liner within 5-10 min.   PRECAUTIONS:  Other: no BP LUE  WEIGHT BEARING RESTRICTIONS: No  FALLS: Has patient fallen in last 6 months? Yes. Number of falls 4 denies injuries, shakes but not low blood sugar.   LIVING ENVIRONMENT: Lives with: lives with their family sister just moved into pt's home Lives in: House  single level Home Access: Stairs to enter Home layout: One level Stairs: Yes: External: 4 steps; on left going up Has following equipment at home: Single point cane, Environmental consultant - 2 wheeled, Environmental consultant - 4 wheeled, Wheelchair (manual), [employer], and Grab bars  PLOF: Independent, Independent with household mobility without device, and Independent with community mobility without device prosthesis only for community activities  PATIENT GOALS:   walk in community. Be active without fear of falling.   OBJECTIVE:  COGNITION: Overall cognitive status: Within functional limits for tasks assessed   SENSATION: WFL  POSTURE: rounded shoulders, forward head, flexed trunk , and weight shift right  LOWER EXTREMITY ROM:  ROM P:passive  A:active Right eval Left eval  Hip flexion    Hip extension    Hip abduction    Hip adduction    Hip internal rotation    Hip external rotation    Knee flexion  WFL  Knee extension  [facility]  Ankle dorsiflexion    Ankle plantarflexion    Ankle inversion    Ankle eversion     (Blank rows = not tested)  LOWER EXTREMITY MMT:  MMT Right eval Left eval Right 08/05/22 Left 08/05/22  Hip flexion 5/5 4/5 5/5 5/5  Hip extension 4/5 4-/5 5/5 5/5  Hip abduction 4/5 4-/5 5/5 5/5  Hip adduction      Hip internal rotation      Hip external rotation      Knee flexion 5/5 4/5 5/5 5/5  Knee extension 5/5 4/5 5/5 5/5  Ankle dorsiflexion      Ankle plantarflexion      Ankle inversion      Ankle eversion      (Blank rows = not tested)  TRANSFERS: Sit to stand: Modified independence requires UE assist on armrests Stand to sit: Modified independence requires UE assist on  armrests  GAIT: 08/05/2022: max tolerable with cane 500'. HR 66 at rest & 85 after gait.  Pt neg ramp & curb with cane modified independent.         Standardized Balance Assessment   Standardized Balance Assessment Sharlene Motts Balance Test 08/03/2022     Berg Balance Test   Sit to Stand Able to stand without using hands and stabilize independently    Standing Unsupported Able to stand safely 2 minutes    Sitting with Back Unsupported but Feet Supported on Floor or Stool Able to sit safely and securely 2 minutes    Stand to Sit Sits safely with minimal use of hands    Transfers Able to transfer safely, minor use of hands    Standing Unsupported with Eyes Closed Able to stand 10 seconds safely    Standing Unsupported with Feet Together Able to place feet together independently and stand 1 minute safely    From Standing, Reach Forward with  Outstretched Arm Can reach confidently >25 cm (10")    From Standing Position, Pick up Object from Floor Able to pick up shoe safely and easily    From Standing Position, Turn to Look Behind Over each Shoulder Looks behind from both sides and weight shifts well    Turn 360 Degrees Able to turn 360 degrees safely in 4 seconds or less    Standing Unsupported, Alternately Place Feet on Step/Stool Able to stand independently and safely and complete 8 steps in 20 seconds    Standing Unsupported, One Foot in Front Able to plae foot ahead of the other independently and hold 30 seconds    Standing on One Leg Able to lift leg independently and hold equal to or more than 3 seconds    Total Score 53    Berg comment: BERG  < 36 high risk for falls (close to 100%) 46-51 moderate (>50%)   37-45 significant (>80%) 52-55 lower (> 25%)      Functional Gait  Assessment  08/05/2022   Gait assessed  Yes    Gait Level Surface Walks 20 ft in less than 5.5 sec, no assistive devices, good speed, no evidence for imbalance, normal gait pattern, deviates no more than 6 in outside of the 12  in walkway width.    Change in Gait Speed Able to smoothly change walking speed without loss of balance or gait deviation. Deviate no more than 6 in outside of the 12 in walkway width.    Gait with Horizontal Head Turns Performs head turns smoothly with no change in gait. Deviates no more than 6 in outside 12 in walkway width    Gait with Vertical Head Turns Performs head turns with no change in gait. Deviates no more than 6 in outside 12 in walkway width.    Gait and Pivot Turn Pivot turns safely within 3 sec and stops quickly with no loss of balance.    Step Over Obstacle Is able to step over one shoe box (4.5 in total height) without changing gait speed. No evidence of imbalance.    Gait with Narrow Base of Support Ambulates less than 4 steps heel to toe or cannot perform without assistance.    Gait with Eyes Closed Walks 20 ft, uses assistive device, slower speed, mild gait deviations, deviates 6-10 in outside 12 in walkway width. Ambulates 20 ft in less than 9 sec but greater than 7 sec.    Ambulating Backwards Walks 20 ft, uses assistive device, slower speed, mild gait deviations, deviates 6-10 in outside 12 in walkway width.    Steps Alternating feet, must use rail.    Total Score 23    FGA comment: cane & TTA prosthesis            08/03/2022: max tolerable distance without device 350' modified independent.  07/27/2022: Functional Gait Assessment: 18/30  06/17/2022 6 Minute Walk Test with cane & TTA prosthesis:  Pre HR 75 SpO2 97% no dyspnea  Post HR 95 SpO2 93% dyspnea 7/10 with significant ShOB for 3 min post activity Pt amb 545' in 4 min 30 sec with 3 standing 15 sec rests required.  She was unable to complete the 6 min time.    06/10/2022: gait velocity without AD 3.03 ft/sec  Eval / 05/12/2022:  Gait pattern: step through pattern, decreased arm swing- Left, decreased step length- Right, decreased stance time- Left, decreased hip/knee flexion- Left, Left hip hike, antalgic, and  abducted- Left Distance walked: 120'  Assistive device utilized: arrived using rollator walker;  assessed with cane & no device. Level of assistance: cane CGA and no device Min A Gait velocity: 2.51 ft/sec with cane &  2.84 ft/sec no device (MinA)  FUNCTIONAL TESTs:  08/03/2022: Sharlene Motts Balance 53/56  07/07/2022: 4-square step test with cane 14.88sec with SBA and without cane 13.78 sec with CGA  06/10/2022: 4-square step test with cane 17.98sec with CGA and without cane 20.13 sec with minA  06/08/2022: TUG   with cane standard 12.12 sec & cognitive 12.66 sec  Without device standard 9.69 sec & cognitive 11.00 sec Berg Balance 45/56  Eval / 05/12/2022:  Timed up and go (TUG):  standard 14.40sec & Cognitive 18.69sec   Both with cane. Berg Balance Scale: 38/56    CARDIOVASCULAR RESPONSE: Functional activity: Berg & Gait assessment Pre-activity vitals: HR: 76 SpO2: 94% Post-activity vitals: HR: 84 SpO2: 95%   CURRENT PROSTHETIC WEAR ASSESSMENT:  08/03/2022: Pt is independent is all aspects of prosthetic care. She reports wearing prosthesis all awake hours without issues.   Eval / 05/12/2022:  Patient is independent with: skin check, residual limb care, care of non-amputated limb, prosthetic cleaning, correct ply sock adjustment, and proper wear schedule/adjustment Patient is dependent with: donning & managing edema / limb volume Donning prosthesis: SBA Doffing prosthesis: Modified independence Prosthetic wear tolerance: 10-12 hours of 15-16 awake hours (62-80% of awake hours) limited by pain Prosthetic weight bearing tolerance: 5 minutes limited by fatigue Edema: pitting Residual limb condition: no open areas, no hair growth, normal color, temperature & moisture, cylindrical shape Prosthetic description: silicon gel liner, suction pin sleeve with flexible inner socket supspension, total contact socket, dynamic response foot K code/activity level with prosthetic use: Level  3    TODAY'S TREATMENT:                                                                                                                             DATE:  08/05/2022: See objective data  Pt requested instruction into use of kneel walker   08/03/2022: Prosthetic Training with TTA prosthesis: See objective data above. Stepping over foam beam without device 6 reps safely. Patient ambulated 75 feet carrying 20 pound kettle bell safely.   Neuromuscular Re-education: See Objective data for Berg 53/56 Stepping laterally on & off foam beam with touch //bar for balance. Sidestepping along foam beam with intermittent touch //bars.  Stepping on & over foam beam with each LE 3 reps inside //bars 3 reps without device with supervision.  07/29/2022: Prosthetic Training with TTA prosthesis: PT reviewed drying limb & liner if notices sweat. PT recommends not leaving prosthesis off to enable skin to cool off. Pt verbalized understanding.  PT recommended using rollator walker with light pressure on BUEs to walk to her mother's grave.  Pt verbalized understanding. Pt neg 11 steps with right rail & LUE cane step to pattern but alternating lead LE. PT recommended using this technique with stairs to  develop functional strength BLEs. Pt verbalized understanding.  Pt able to step over obstacle with & without cane with supervision.  Pt amb 100' X 2 without device safely.   Neuromuscular Re-education: Stepping laterally on & off foam beam with minA for balance. Sidestepping along foam beam with modA. Stepping on & over foam beam with each LE 3 reps with cane & 3 reps without device with supervision.    HOME EXERCISE PROGRAM: Access Code: [email or web address] URL: [email or web address] Date: 07/21/2022 Prepared by: Vladimir Faster  Exercises Stretches daily - Seated Hamstring Stretch with Strap  - 1 x daily - 7 x weekly - 1 sets - 2-3 reps - 30 seconds hold - standing calf stretch with forefoot on small  step or brick  - 1 x daily - 7 x weekly - 1 sets - 2-3 reps - 30 seconds hold - Seated Trunk Rotation Stretch  - 1 x daily - 7 x weekly - 1 sets - 2-3 reps - 30 seconds hold - Supine Quadriceps Stretch with Strap on Table  - 1 x daily - 7 x weekly - 1 sets - 2-3 reps - 30 seconds hold Counter Mondays & Thursdays - Tandem Walking with Counter Support  - 1 x daily - 2 x weekly - 1 sets - 3-5 reps - Backward Walking with Counter Support  - 1 x daily - 2 x weekly - 1 sets - 3-5 reps - Carioca with Counter Support  - 1 x daily - 2 x weekly - 1 sets - 3-5 reps - Standing Tandem Balance with Counter Support  - 1 x daily - 2 x weekly - 1 sets - 1 reps - 50 seconds hold - Standing Rolling Forward & Back, Side to Side and Circles  - 1 x daily -2 x weekly - 1 sets - 10 reps Band exercises with chair back & cane Tuesdays & Fridays - Standing Hip Flexion with Resistance (Mirrored)  - 1 x daily - 2 x weekly - 1 sets - 10 reps - Standing Hip Extension with Resistance  - 1 x daily - 2 x weekly - 1 sets - 10 reps - Standing Hip Adduction with Resistance (Mirrored)  - 1 x daily - 2 x weekly - 1 sets - 10 reps - Standing Hip Abduction with Theraband Resistance  - 1 x daily - 2 x weekly - 1 sets - 10 reps Corner exercises Wednesdays & Saturdays - feet apart standing on floor with head motions  - 1 x daily - 2 x weekly - 1 sets - 5-10 reps - Wide stance on Foam Pad head movements  - 1 x daily - 2 x weekly - 1 sets - 5-10 reps  Increasing your activity level is important.  Short distances which is walking from one room to another. Work to increase frequency back to prior level.  Medium distances are entering & exiting your home or community with limited distances. Start with 4 medium walks which is one outing to one location and increase number of tolerated amounts per day.  Long walk is walking program Walking Program 3-5 days/week. Can use stepper at [employer] if weather not conducive to walking outdoors.   Use rollator walker to enable resting spot and light upper extremity support. Progress to next level when current level is not challenge or difficult.  Start walking 5 min, rest 5 min for 2 sets Add 3rd set. So you are walking total of 15 min.  Decrease rest to 4  min. Walk 5 min for 3 sets Walk 6 min, rest 4 min for 3 sets. Decrease rest to 3 min. Walking 6 min for 3 sets Walk 7 min, rest 3 min, 3 sets Walk 10 min, rest 5 min 2 sets Walk 11 min, rest 5 min, 2 sets Walk 12 min, rest 5 min, 2 sets Walk 13 min, rest 5 min, 2 sets Walk 14 min, rest 5 min, 2 sets Walk 15 min, rest 5 min, 2 sets Walk 15 min, rest 4 min, 2 sets Walk 15 min, rest 3 min, 2 sets Walk 15 min, rest 2 min, 2 sets Walk 15 min, rest 1 min, 2 sets Walk 30 min   ASSESSMENT: CLINICAL IMPRESSION: Patient met all LTGs. She appears to be functioning at full community level.  Patient's Berg balance score has improved to 53/56 indicating less than 25% risk of falls.  Patient is able to ambulate up to 350 feet without an assistive device.  She is able to ambulate even greater community distances over 500' with a cane or rollator walker.   OBJECTIVE IMPAIRMENTS: Abnormal gait, cardiopulmonary status limiting activity, decreased activity tolerance, decreased balance, decreased endurance, decreased knowledge of use of DME, decreased mobility, difficulty walking, decreased strength, increased edema, postural dysfunction, prosthetic dependency , and pain.   ACTIVITY LIMITATIONS: carrying, lifting, sitting, standing, squatting, stairs, transfers, and locomotion level  PARTICIPATION LIMITATIONS: meal prep, driving, shopping, and community activity  PERSONAL FACTORS: Fitness, Time since onset of injury/illness/exacerbation, and 3+ comorbidities: see PMH  are also affecting patient's functional outcome.   REHAB POTENTIAL: Good  CLINICAL DECISION MAKING: Evolving/moderate complexity  EVALUATION COMPLEXITY:  Moderate   GOALS: Goals reviewed with patient? Yes  SHORT TERM GOALS: Target date: 07/09/2022  Patient verbalizes proper adjusting ply socks with limb volume changes. Baseline: SEE OBJECTIVE DATA Goal status: MET 07/02/2022 2.  Patient tolerates prosthesis >90% awake hrs /day without skin issues or limb pain </= 2/10 after standing. Baseline: SEE OBJECTIVE DATA Goal status:   MET 07/02/2022  3. 4-square test with cane <15 sec safely Baseline: SEE OBJECTIVE DATA Goal status:   MET 07/07/2022  4. Patient ambulates 300' with cane & prosthesis with supervision. Baseline: SEE OBJECTIVE DATA Goal status:   MET 07/02/2022  5. Patient demo & verbalizes understanding of updated HEP Baseline: SEE OBJECTIVE DATA Goal status:   MET 07/02/2022  LONG TERM GOALS: Target date: 08/06/2022  Patient demonstrates & verbalized understanding of prosthetic care to enable safe utilization of prosthesis. Baseline: SEE OBJECTIVE DATA Goal status:   MET 08/03/2022  Patient tolerates prosthesis wear >90% of awake hours without skin or limb pain issues. Baseline: SEE OBJECTIVE DATA Goal status:   MET 08/03/2022  Berg Balance >46/56 Baseline: SEE OBJECTIVE DATA Goal status:    MET 08/03/2022  Patient ambulates >500' with prosthesis and cane or less independently Baseline: SEE OBJECTIVE DATA Goal status:  MET 08/05/2022  Patient negotiates ramps, curbs & stairs with single rail with prosthesis and cane or less independently. Baseline: SEE OBJECTIVE DATA Goal status:  MET 08/05/2022  Patient verbalizes & demonstrates understanding of how to properly use fitness equipment to return to gym. Baseline: SEE OBJECTIVE DATA Goal status:   MET 08/05/2022  PLAN:  PT FREQUENCY: 2x/week  PT DURATION: 12 weeks  PLANNED INTERVENTIONS: Therapeutic exercises, Therapeutic activity, Neuromuscular re-education, Balance training, Gait training, Patient/Family education, Self Care, Stair training, Vestibular training,  Canalith repositioning, Prosthetic training, DME instructions, and physical performance testing  PLAN FOR NEXT SESSION: Discharge  PT   Vladimir Faster, PT, DPT 08/05/2022, 2:27 PM

## 2022-09-22 ENCOUNTER — Encounter: Payer: Self-pay | Admitting: *Deleted

## 2022-10-08 ENCOUNTER — Ambulatory Visit: Payer: Medicare Other | Admitting: Nurse Practitioner

## 2022-10-16 ENCOUNTER — Ambulatory Visit: Payer: Medicare Other | Admitting: Family Medicine

## 2022-11-20 ENCOUNTER — Ambulatory Visit: Payer: Medicare Other | Admitting: Family Medicine

## 2022-11-20 ENCOUNTER — Encounter: Payer: Self-pay | Admitting: Family Medicine

## 2022-11-20 VITALS — BP 114/60 | HR 61 | Temp 97.3°F | Ht 66.0 in | Wt 196.6 lb

## 2022-11-20 DIAGNOSIS — M810 Age-related osteoporosis without current pathological fracture: Secondary | ICD-10-CM

## 2022-11-20 DIAGNOSIS — M7061 Trochanteric bursitis, right hip: Secondary | ICD-10-CM | POA: Diagnosis not present

## 2022-11-20 DIAGNOSIS — Z940 Kidney transplant status: Secondary | ICD-10-CM

## 2022-11-20 DIAGNOSIS — Z89612 Acquired absence of left leg above knee: Secondary | ICD-10-CM | POA: Diagnosis not present

## 2022-11-20 MED ORDER — GABAPENTIN 300 MG PO CAPS: 300.0000 mg | ORAL_CAPSULE | Freq: Three times a day (TID) | ORAL | 3 refills | Status: DC

## 2022-11-20 NOTE — Progress Notes (Signed)
   Subjective:    Patient ID: Shelley [name], female    DOB: [birth year], 72 y.o.   MRN: [id number]  HPI   6 month follow up chronic medical conditions Patient has history of kidney transplant She also has diabetes followed by endocrinology She states lab work is done through [facility] and she is up-to-date currently.  I did discuss with her that the labs do not show a recent A1c.  She states they calculate that from her continuous glucose monitor I encouraged her to do a up-to-date A1c and cholesterol she will get these through [facility]  Right hip pain when laying on it she describes pain when she lays on it no pain when she is walking denies any setbacks but she did have a fall where she banged her hip against a bathroom tile she was able to move around afterwards.  She apparently got dehydrated called EMS they gave her IV fluids she did not have to go to the hospital  Gabapentin causing to be spaced out feeling she states at times she feels spaced out other times has some difficulty remembering denies any repetitive nature to this denies short-term memory loss.  At times she just feels fuzzy she is currently on 600 mg prescribed 3 times daily but she is taking it twice daily  Review of Systems     Objective:   Physical Exam General-in no acute distress Eyes-no discharge Lungs-respiratory rate normal, CTA CV-no murmurs,RRR Extremities skin warm dry no edema Neuro grossly normal Behavior normal, alert        Assessment & Plan:   1. Hx of AKA (above knee amputation), left (HCC) Patient feels gabapentin is causing her some cognitive fuzziness she would like to come down on the dose currently taking 600 mg twice daily I recommend 300 mg 3 times daily for 2 weeks if her symptoms of phantom pain from the amputation is doing fine on that dose and she wants to go to a lower dose she could reduce it to twice daily she is to report to [place] how that is doing in regards to cognitive  issues If ongoing cognitive issues more formal evaluation  2. Hx of kidney transplant Follow through with [facility] on a regular basis  3. Osteoporosis, unspecified osteoporosis type, unspecified pathological fracture presence Will discuss with her transplant team to make sure that she can take biphosphonate's  4. Trochanteric bursitis of right hip Cool compresses Tylenol if persistent trouble recommend orthopedics The likelihood of a fracture is low I do not recommend an x-ray  Patient will get her labs done in the next couple weeks regarding her diabetes through [facility] Follow-up here 4 months

## 2022-12-20 ENCOUNTER — Other Ambulatory Visit: Payer: Self-pay | Admitting: Family Medicine

## 2022-12-25 ENCOUNTER — Ambulatory Visit (INDEPENDENT_AMBULATORY_CARE_PROVIDER_SITE_OTHER): Payer: Medicare Other

## 2022-12-25 VITALS — Ht 66.0 in | Wt 190.0 lb

## 2022-12-25 DIAGNOSIS — Z0000 Encounter for general adult medical examination without abnormal findings: Secondary | ICD-10-CM | POA: Diagnosis not present

## 2022-12-25 DIAGNOSIS — Z1231 Encounter for screening mammogram for malignant neoplasm of breast: Secondary | ICD-10-CM

## 2022-12-25 DIAGNOSIS — Z1211 Encounter for screening for malignant neoplasm of colon: Secondary | ICD-10-CM

## 2022-12-25 NOTE — Progress Notes (Signed)
Subjective:   Shelley [name] is a 72 y.o. female who presents for Medicare Annual (Subsequent) preventive examination.  Visit Complete: Virtual  I connected with  [name] on 12/25/22 by a audio enabled telemedicine application and verified that I am speaking with the correct person using two identifiers.  Patient Location: Home  Provider Location: Home Office  I discussed the limitations of evaluation and management by telemedicine. The patient expressed understanding and agreed to proceed.  Patient Medicare AWV questionnaire was completed by the patient on 12/25/2022; I have confirmed that all information answered by patient is correct and no changes since this date.   Because this visit was a virtual/telehealth visit, some criteria may be missing or patient reported. Any vitals not documented were not able to be obtained and vitals that have been documented are patient reported.       Objective:    Today's Vitals   12/25/22 1538  Weight: 190 lb (86.2 kg)  Height: 5\' 6"  (1.676 m)   Body mass index is 30.67 kg/m.     12/25/2022    3:41 PM 05/12/2022    1:43 PM 06/05/2020    9:34 AM 02/15/2017   11:44 AM 03/12/2016    9:23 AM  Advanced Directives  Does Patient Have a Medical Advance Directive? Yes Yes Yes Yes No  Type of Estate agent of [place];Living will Healthcare Power of [place];Living will Healthcare Power of [place];Living will Living will   Copy of Healthcare Power of Attorney in Chart? No - copy requested No - copy requested Yes - validated most recent copy scanned in chart (See row information)    Would patient like information on creating a medical advance directive?     No - Patient declined    Current Medications (verified) Outpatient Encounter Medications as of 12/25/2022  Medication Sig   amLODipine (NORVASC) 10 MG tablet Take 10 mg by mouth daily.    atorvastatin (LIPITOR) 40 MG tablet TAKE 1 TABLET (40 MG TOTAL) BY MOUTH DAILY.    calcium acetate (PHOSLO) 667 MG capsule Take 1,334 mg by mouth 3 (three) times daily with meals.    calcium-vitamin D (OSCAL WITH D) 500-200 MG-UNIT tablet Take 1 tablet by mouth.   carvedilol (COREG) 25 MG tablet Take 25 mg by mouth 2 (two) times daily with a meal.   cloNIDine (CATAPRES) 0.1 MG tablet TAKE 1 TABLET BY MOUTH TWICE A DAY   famotidine (PEPCID) 10 MG tablet Take by mouth.   gabapentin (NEURONTIN) 300 MG capsule Take 1 capsule (300 mg total) by mouth 3 (three) times daily.   insulin aspart (NOVOLOG) 100 UNIT/ML FlexPen Inject 5 Units as directed 3 (three) times daily with meals.   Insulin Pen Needle (PEN NEEDLES 3/16") 31G X 5 MM MISC 1 each by Does not apply route daily. Use daily to inject insulin   Multiple Vitamin (MULTIVITAMIN WITH MINERALS) TABS tablet Take 1 tablet by mouth daily.   mycophenolate (CELLCEPT) 250 MG capsule Take by mouth 2 (two) times daily. Takes 3 capsules by mouth Q 12 hours   predniSONE (DELTASONE) 5 MG tablet Take 5 mg by mouth daily with breakfast.   Semaglutide,0.25 or 0.5MG /DOS, (OZEMPIC, 0.25 OR 0.5 MG/DOSE,) 2 MG/1.5ML SOPN Inject into the skin. Prescribed by endocrinologist   sertraline (ZOLOFT) 50 MG tablet TAKE 1 TABLET BY MOUTH EVERY DAY   TRESIBA FLEXTOUCH 200 UNIT/ML SOPN INJECT 40 UNITS INTO THE SKIN DAILY. (Patient taking differently: 20 Units.)   No facility-administered  encounter medications on file as of 12/25/2022.    Allergies (verified) Patient has no known allergies.   History: Past Medical History:  Diagnosis Date   Diabetes (HCC)    Diabetes mellitus without complication (HCC)    End stage renal disease (HCC)    HTN (hypertension)    Hyperlipidemia    Hypertension    Past Surgical History:  Procedure Laterality Date   ABDOMINAL HYSTERECTOMY     fibroids/complete hysterec 2000   APPENDECTOMY     CATARACT EXTRACTION Bilateral    COLONOSCOPY N/A 03/12/2016   Procedure: COLONOSCOPY;  Surgeon: Corbin Ade, MD;   [facility];  Service: Endoscopy;  Laterality: N/A;  9:30 am   POLYPECTOMY  03/12/2016   Procedure: POLYPECTOMY;  Surgeon: Corbin Ade, MD;  Location: [facility];  Service: Endoscopy;;  colon   YAG LASER APPLICATION Left 01/22/2014   Procedure: YAG LASER APPLICATION;  Surgeon: Susa Simmonds, MD;  Location: [facility];  Service: Ophthalmology;  Laterality: Left;   History reviewed. No pertinent family history. Social History   Socioeconomic History   Marital status: Single    Spouse name: Not on file   Number of children: Not on file   Years of education: Not on file   Highest education level: Bachelor's degree (e.g., BA, AB, BS)  Occupational History   Not on file  Tobacco Use   Smoking status: Former    Current packs/day: 0.00    Average packs/day: 1 pack/day for 4.0 years (4.0 ttl pk-yrs)    Types: Cigarettes    Start date: 01/29/2001    Quit date: 01/29/2005    Years since quitting: 17.9   Smokeless tobacco: Never  Vaping Use   Vaping status: Never Used  Substance and Sexual Activity   Alcohol use: No   Drug use: No   Sexual activity: Not on file  Other Topics Concern   Not on file  Social History Narrative   Not on file   Social Determinants of Health   Financial Resource Strain: Low Risk  (12/25/2022)   Overall Financial Resource Strain (CARDIA)    Difficulty of Paying Living Expenses: Not hard at all  Food Insecurity: No Food Insecurity (12/25/2022)   Hunger Vital Sign    Worried About Running Out of Food in the Last Year: Never true    Ran Out of Food in the Last Year: Never true  Transportation Needs: No Transportation Needs (12/25/2022)   PRAPARE - Administrator, Civil Service (Medical): No    Lack of Transportation (Non-Medical): No  Physical Activity: Insufficiently Active (12/25/2022)   Exercise Vital Sign    Days of Exercise per Week: 3 days    Minutes of Exercise per Session: 30 min  Stress: No Stress Concern Present  (12/25/2022)   [employer] of Occupational Health - Occupational Stress Questionnaire    Feeling of Stress : Not at all  Social Connections: Moderately Integrated (12/25/2022)   Social Connection and Isolation Panel [NHANES]    Frequency of Communication with Friends and Family: More than three times a week    Frequency of Social Gatherings with Friends and Family: More than three times a week    Attends Religious Services: More than 4 times per year    Active Member of [employer] or Organizations: Yes    Attends Engineer, structural: More than 4 times per year    Marital Status: Never married    Tobacco Counseling Counseling  given: Not Answered   Clinical Intake:  Pre-visit preparation completed: Yes  Pain : No/denies pain     Nutritional Risks: None Diabetes: Yes CBG done?: No Did pt. bring in CBG monitor from home?: No  How often do you need to have someone help you when you read instructions, pamphlets, or other written materials from your doctor or pharmacy?: 1 - Never  Interpreter Needed?: No  Information entered by :: Renie Ora, LPN   Activities of Daily Living    12/25/2022   12:29 PM  In your present state of health, do you have any difficulty performing the following activities:  Hearing? 0  Vision? 0  Difficulty concentrating or making decisions? 0  Walking or climbing stairs? 1  Dressing or bathing? 0  Doing errands, shopping? 1  Preparing Food and eating ? N  Using the Toilet? N  In the past six months, have you accidently leaked urine? Y  Do you have problems with loss of bowel control? N  Managing your Medications? N  Managing your Finances? N  Housekeeping or managing your Housekeeping? Y    Patient Care Team: Babs Sciara, MD as PCP - General (Family Medicine)  Indicate any recent Medical Services you may have received from other than [facility] providers in the past year (date may be approximate).     Assessment:   This is a  routine wellness examination for [place].  Hearing/Vision screen Vision Screening - Comments:: Wears rx glasses - up to date with routine eye exams with  [facility]    Goals Addressed             This Visit's Progress    DIET - EAT MORE FRUITS AND VEGETABLES         Depression Screen    12/25/2022    3:40 PM 04/17/2022    1:18 PM 11/04/2021   11:37 AM 03/18/2017    9:40 AM 06/25/2015   11:00 AM 06/17/2015    1:45 PM  PHQ 2/9 Scores  PHQ - 2 Score 0 0 0 3 0 0  PHQ- 9 Score  0  6      Fall Risk    12/25/2022    3:39 PM 12/25/2022   12:29 PM 11/20/2022   11:33 AM 04/17/2022    1:17 PM 11/04/2021   11:37 AM  Fall Risk   Falls in the past year? 1 1 1  0 1  Number falls in past yr: 1 1 1   0  Injury with Fall? 1 0 0  0  Risk for fall due to : History of fall(s);Impaired balance/gait;Orthopedic patient   Impaired balance/gait Other (Comment)  Follow up Education provided;Falls prevention discussed;Falls evaluation completed   Falls evaluation completed Falls evaluation completed;Education provided    MEDICARE RISK AT HOME: Medicare Risk at Home Any stairs in or around the home?: No If so, are there any without handrails?: No Home free of loose throw rugs in walkways, pet beds, electrical cords, etc?: Yes Adequate lighting in your home to reduce risk of falls?: Yes Life alert?: No Use of a cane, walker or w/c?: No Grab bars in the bathroom?: Yes Shower chair or bench in shower?: Yes Elevated toilet seat or a handicapped toilet?: Yes  TIMED UP AND GO:  Was the test performed?  No    Cognitive Function:        12/25/2022    3:41 PM  6CIT Screen  What Year? 0 points  What month?  0 points  What time? 0 points  Count back from 20 0 points  Months in reverse 0 points  Repeat phrase 0 points  Total Score 0 points    Immunizations Immunization History  Administered Date(s) Administered   Fluad Quad(high Dose 65+) 12/16/2021   Influenza,inj,Quad PF,6+ Mos  05/17/2015   Influenza-Unspecified 12/05/2015, 12/21/2016   Moderna Covid-19 Vaccine Bivalent Booster 64yrs & up 01/06/2021   Moderna Sars-Covid-2 Vaccination 06/27/2019, 07/25/2019, 11/29/2019, 09/01/2020   PNEUMOCOCCAL CONJUGATE-20 12/16/2021   Pneumococcal Conjugate-13 03/18/2017   Pneumococcal Polysaccharide-23 10/09/2020   Pneumococcal-Unspecified 12/22/2010   Zoster Recombinant(Shingrix) 01/24/2020   Zoster, Unspecified 11/28/2019    TDAP status: Due, Education has been provided regarding the importance of this vaccine. Advised may receive this vaccine at local pharmacy or Health Dept. Aware to provide a copy of the vaccination record if obtained from local pharmacy or Health Dept. Verbalized acceptance and understanding.  Flu Vaccine status: Due, Education has been provided regarding the importance of this vaccine. Advised may receive this vaccine at local pharmacy or Health Dept. Aware to provide a copy of the vaccination record if obtained from local pharmacy or Health Dept. Verbalized acceptance and understanding.  Pneumococcal vaccine status: Up to date  Covid-19 vaccine status: Completed vaccines  Qualifies for Shingles Vaccine? Yes   Zostavax completed Yes   Shingrix Completed?: Yes  Screening Tests Health Maintenance  Topic Date Due   Hepatitis C Screening  Never done   DTaP/Tdap/Td (1 - Tdap) Never done   HEMOGLOBIN A1C  02/26/2018   Zoster Vaccines- Shingrix (2 of 2) 03/20/2020   Colonoscopy  03/12/2021   COVID-19 Vaccine (6 - 2023-24 season) 11/22/2022   FOOT EXAM  12/17/2022   INFLUENZA VACCINE  06/21/2023 (Originally 10/22/2022)   MAMMOGRAM  01/02/2023   OPHTHALMOLOGY EXAM  09/01/2023   Medicare Annual Wellness (AWV)  12/25/2023   Pneumonia Vaccine 67+ Years old  Completed   DEXA SCAN  Completed   HPV VACCINES  Aged Out    Health Maintenance  Health Maintenance Due  Topic Date Due   Hepatitis C Screening  Never done   DTaP/Tdap/Td (1 - Tdap) Never done    HEMOGLOBIN A1C  02/26/2018   Zoster Vaccines- Shingrix (2 of 2) 03/20/2020   Colonoscopy  03/12/2021   COVID-19 Vaccine (6 - 2023-24 season) 11/22/2022   FOOT EXAM  12/17/2022    Colorectal cancer screening: Referral to GI placed 12/25/2022. Pt aware the office will call re: appt.  Mammogram status: Ordered 12/25/2022. Pt provided with contact info and advised to call to schedule appt.   Bone Density status: Completed 01/01/2022. Results reflect: Bone density results: OSTEOPOROSIS. Repeat every 2 years.  Lung Cancer Screening: (Low Dose CT Chest recommended if Age 35-80 years, 20 pack-year currently smoking OR have quit w/in 15years.) does not qualify.   Lung Cancer Screening Referral: n/a  Additional Screening:  Hepatitis C Screening: does not qualify;   Vision Screening: Recommended annual ophthalmology exams for early detection of glaucoma and other disorders of the eye. Is the patient up to date with their annual eye exam?  Yes  Who is the provider or what is the name of the office in which the patient attends annual eye exams? [facility]  If pt is not established with a provider, would they like to be referred to a provider to establish care? No .   Dental Screening: Recommended annual dental exams for proper oral hygiene   Community Resource Referral / Chronic Care  Management: CRR required this visit?  No   CCM required this visit?  No     Plan:     I have personally reviewed and noted the following in the patient's chart:   Medical and social history Use of alcohol, tobacco or illicit drugs  Current medications and supplements including opioid prescriptions. Patient is not currently taking opioid prescriptions. Functional ability and status Nutritional status Physical activity Advanced directives List of other physicians Hospitalizations, surgeries, and ER visits in previous 12 months Vitals Screenings to include cognitive, depression, and  falls Referrals and appointments  In addition, I have reviewed and discussed with patient certain preventive protocols, quality metrics, and best practice recommendations. A written personalized care plan for preventive services as well as general preventive health recommendations were provided to patient.     Lorrene Reid, LPN   [id number]   After Visit Summary: (MyChart) Due to this being a telephonic visit, the after visit summary with patients personalized plan was offered to patient via MyChart   Nurse Notes: none

## 2022-12-25 NOTE — Patient Instructions (Signed)
Shelley [name] , Thank you for taking time to come for your Medicare Wellness Visit. I appreciate your ongoing commitment to your health goals. Please review the following plan we discussed and let me know if I can assist you in the future.   Referrals/Orders/Follow-Ups/Clinician Recommendations: Aim for 30 minutes of exercise or brisk walking, 6-8 glasses of water, and 5 servings of fruits and vegetables each day.   This is a list of the screening recommended for you and due dates:  Health Maintenance  Topic Date Due   Hepatitis C Screening  Never done   DTaP/Tdap/Td vaccine (1 - Tdap) Never done   Hemoglobin A1C  02/26/2018   Zoster (Shingles) Vaccine (2 of 2) 03/20/2020   Colon Cancer Screening  03/12/2021   COVID-19 Vaccine (6 - 2023-24 season) 11/22/2022   Complete foot exam   12/17/2022   Flu Shot  06/21/2023*   Mammogram  01/02/2023   Eye exam for diabetics  09/01/2023   Medicare Annual Wellness Visit  12/25/2023   Pneumonia Vaccine  Completed   DEXA scan (bone density measurement)  Completed   HPV Vaccine  Aged Out  *Topic was postponed. The date shown is not the original due date.    Advanced directives: (Copy Requested) Please bring a copy of your health care power of attorney and living will to the office to be added to your chart at your convenience.  Next Medicare Annual Wellness Visit scheduled for next year: Yes  Insert Preventive Care attachment Insert FALL PREVENTION attachment if needed

## 2022-12-26 ENCOUNTER — Ambulatory Visit: Payer: Medicare Other

## 2022-12-28 ENCOUNTER — Encounter: Payer: Self-pay | Admitting: *Deleted

## 2022-12-31 ENCOUNTER — Telehealth: Payer: Self-pay

## 2022-12-31 NOTE — Telephone Encounter (Signed)
Left a message for kidney transplant center Dr Lannie Fields # [phone] - in regards to taking osteoporosis medication - fosamax post kidney transplant

## 2023-01-01 NOTE — Telephone Encounter (Signed)
Ginger returned call and stated it was ok for patient to take fossamax

## 2023-01-05 NOTE — Telephone Encounter (Signed)
Called pt to inform it is okay for her to take fosamax, No answer, left Vm for callback

## 2023-01-05 NOTE — Telephone Encounter (Signed)
Nurses-please connect with patient let her know that her transplant team stated that she can take Fosamax 70 mg once a week We can go ahead and send this in if she is interested if she wants to discuss it further at her follow-up visit in December we will be happy to do so (If she does choose Fosamax 70 mg once a week taken in the morning on an empty stomach, keep upright after taking, take with 8 ounces of water on empty stomach 30 minutes later may have breakfast) #4 with 11 refills if she consents to the medicine

## 2023-01-06 ENCOUNTER — Other Ambulatory Visit: Payer: Self-pay

## 2023-01-06 MED ORDER — ALENDRONATE SODIUM 70 MG PO TABS: 70.0000 mg | ORAL_TABLET | ORAL | 11 refills | Status: DC

## 2023-01-06 NOTE — Telephone Encounter (Signed)
Called pt to inform it is okay for her to take fosamax, No answer, left Vm for callback

## 2023-01-06 NOTE — Telephone Encounter (Signed)
Pt called back and is okay with starting Fosamax per Dr Gerda Diss. RX sent in

## 2023-01-07 ENCOUNTER — Telehealth: Payer: Self-pay

## 2023-01-07 NOTE — Telephone Encounter (Signed)
FYI Call received from patient's transplant team again and states after further review by the pharmacist of her kidney lab results, it is not recommended that she take alendronate/ fosamax , states patient has an upcoming appt coming up and an endocrinologist will evaluate the patient at that time. Ph # [phone]. I called and informed the patient of the change .

## 2023-01-22 ENCOUNTER — Telehealth: Payer: Self-pay

## 2023-01-22 ENCOUNTER — Ambulatory Visit (INDEPENDENT_AMBULATORY_CARE_PROVIDER_SITE_OTHER): Payer: Medicare Other | Admitting: Family Medicine

## 2023-01-22 VITALS — BP 114/76 | HR 88 | Wt 197.8 lb

## 2023-01-22 DIAGNOSIS — M810 Age-related osteoporosis without current pathological fracture: Secondary | ICD-10-CM | POA: Diagnosis not present

## 2023-01-22 DIAGNOSIS — R197 Diarrhea, unspecified: Secondary | ICD-10-CM | POA: Diagnosis not present

## 2023-01-22 DIAGNOSIS — R110 Nausea: Secondary | ICD-10-CM

## 2023-01-22 NOTE — Progress Notes (Signed)
   Subjective:    Patient ID: Shelley [name], female    DOB: Oct 09, 1950, 72 y.o.   MRN: [id number]  Discussed the use of AI scribe software for clinical note transcription with the patient, who gave verbal consent to proceed.  History of Present Illness   The patient, with a history of kidney disease, diabetes, and osteoporosis, was recently hospitalized due to a kidney infection and elevated creatinine levels. The infection was discovered following routine blood work, which revealed a creatinine level of 3.2. The patient was subsequently admitted to the hospital where she was stabilized and treated with antibiotics.  In addition to the kidney infection, the patient has been experiencing intermittent vomiting and diarrhea for approximately a month. The vomiting is sporadic, occurring approximately two days a week, and is often accompanied by nausea. The patient also reports a change in appetite, with certain smells and foods triggering nausea and vomiting. The diarrhea is loose in consistency, occurring once daily, and is not associated with blood or mucus.  The patient also reports a recent discontinuation of Ozempic, a medication prescribed for diabetes management, due to adverse effects. The medication was started approximately two to three months ago and was stopped about three weeks prior to the consultation. The patient has been in communication with a pharmacist from [facility] regarding her diabetes management.  The patient also has osteoporosis and was previously on Fosamax, which has been discontinued. The patient is considering a referral to a specialist for potential treatment with Prolia, an injectable medication for osteoporosis.  The patient's kidney disease has previously required dialysis, and she expresses a strong reluctance to return to dialysis treatment if her kidney function deteriorates further.         Review of Systems     Objective:    Physical Exam   VITALS: BP  114/66 CHEST: Breath sounds clear bilaterally without wheezes, rales, or rhonchi.     Extremities no edema amputation noted      Assessment & Plan:  Assessment and Plan    Acute Kidney Injury Recent hospitalization for elevated creatinine (3.2) secondary to urinary tract infection. Treated with antibiotics and creatinine improved to 2.2. -Next creatinine check scheduled for the week of Thanksgiving at [facility].  Nausea/Vomiting/Diarrhea Intermittent symptoms for over a month. No blood in stool. Symptoms may be related to recent use of Ozempic, which was discontinued 3 weeks ago. -Refer to Gastroenterology for further evaluation and management. -Consider gastroparesis as a potential cause.  Osteoporosis Unable to tolerate Fosamax. -Refer to [facility] for evaluation by an osteoporosis specialist. Possible consideration of Prolia injections.  Diabetes Recent discontinuation of Ozempic due to gastrointestinal side effects. -Continue current management plan with [facility] pharmacist.  Follow-up in December for routine care and to discuss results of upcoming evaluations.      Recommend referral to gastroenterology Stay off of Ozempic Will refer to [facility] for further evaluation with osteoporosis  Patient will let us know if we need to order a metabolic 7 locally patient will keep a follow-up visit in December

## 2023-01-22 NOTE — Transitions of Care (Post Inpatient/ED Visit) (Signed)
   01/22/2023  Name: Shelley [name] MRN: [id number] DOB: 12-25-1950  Today's TOC FU Call Status: Today's TOC FU Call Status:: Unsuccessful Call (1st Attempt) Unsuccessful Call (1st Attempt) Date: 01/22/23  Attempted to reach the patient regarding the most recent Inpatient/ED visit.  Follow Up Plan: Additional outreach attempts will be made to reach the patient to complete the Transitions of Care (Post Inpatient/ED visit) call.   Jodelle Gross RN, BSN, CCM RN Care Manager  Transitions of Care  [facility] - [facility]  [phone]

## 2023-01-25 ENCOUNTER — Telehealth: Payer: Self-pay

## 2023-01-25 NOTE — Transitions of Care (Post Inpatient/ED Visit) (Signed)
   01/25/2023  Name: Shelley [name] MRN: [id number] DOB: [birth year]  Today's TOC FU Call Status: Today's TOC FU Call Status:: Unsuccessful Call (2nd Attempt) Unsuccessful Call (2nd Attempt) Date: 01/25/23  Attempted to reach the patient regarding the most recent Inpatient/ED visit.  Follow Up Plan: Additional outreach attempts will be made to reach the patient to complete the Transitions of Care (Post Inpatient/ED visit) call.   Alyse Low, RN, BA, [facility], CRRN [facility] Coordinator, Transition of Care Ph # [phone]

## 2023-01-26 ENCOUNTER — Other Ambulatory Visit: Payer: Self-pay

## 2023-01-26 ENCOUNTER — Telehealth: Payer: Self-pay

## 2023-01-26 DIAGNOSIS — R110 Nausea: Secondary | ICD-10-CM

## 2023-01-26 DIAGNOSIS — M810 Age-related osteoporosis without current pathological fracture: Secondary | ICD-10-CM

## 2023-01-26 NOTE — Transitions of Care (Post Inpatient/ED Visit) (Signed)
   01/26/2023  Name: Shelley [name] MRN: [id number] DOB: 1951-02-28  Today's TOC FU Call Status: Today's TOC FU Call Status:: Unsuccessful Call (3rd Attempt) Unsuccessful Call (3rd Attempt) Date: 01/26/23  Attempted to reach the patient regarding the most recent Inpatient/ED visit.  Follow Up Plan: No further outreach attempts will be made at this time. We have been unable to contact the patient.  Jodelle Gross RN, BSN, CCM RN Care Manager  Transitions of Care  [facility] - [facility]  [phone]

## 2023-01-28 ENCOUNTER — Encounter (INDEPENDENT_AMBULATORY_CARE_PROVIDER_SITE_OTHER): Payer: Self-pay | Admitting: *Deleted

## 2023-01-30 ENCOUNTER — Encounter: Payer: Self-pay | Admitting: Family Medicine

## 2023-02-03 ENCOUNTER — Ambulatory Visit (INDEPENDENT_AMBULATORY_CARE_PROVIDER_SITE_OTHER): Payer: Medicare Other | Admitting: Gastroenterology

## 2023-02-22 ENCOUNTER — Ambulatory Visit: Payer: Medicare Other | Admitting: Family Medicine

## 2023-02-23 ENCOUNTER — Ambulatory Visit (INDEPENDENT_AMBULATORY_CARE_PROVIDER_SITE_OTHER): Payer: Medicare Other | Admitting: Gastroenterology

## 2023-04-07 ENCOUNTER — Other Ambulatory Visit: Payer: Self-pay | Admitting: Family Medicine

## 2023-06-14 ENCOUNTER — Telehealth: Payer: Self-pay | Admitting: *Deleted

## 2023-06-14 NOTE — Transitions of Care (Post Inpatient/ED Visit) (Signed)
   06/14/2023  Name: Shelley [name] MRN: [id number] DOB: 1950/06/25  Today's TOC FU Call Status: Today's TOC FU Call Status:: Unsuccessful Call (1st Attempt) Unsuccessful Call (1st Attempt) Date: 06/14/23  Attempted to reach the patient regarding the most recent Inpatient/ED visit.  Follow Up Plan: Additional outreach attempts will be made to reach the patient to complete the Transitions of Care (Post Inpatient/ED visit) call.   Irving Shows [facility], BSN RN Care Manager/ Transition of Care [place]/ [facility] [phone]

## 2023-06-15 ENCOUNTER — Telehealth: Payer: Self-pay | Admitting: *Deleted

## 2023-06-15 NOTE — Transitions of Care (Post Inpatient/ED Visit) (Signed)
   06/15/2023  Name: Shelley [name] MRN: [id number] DOB: 1951/01/27  Today's TOC FU Call Status: Today's TOC FU Call Status:: Unsuccessful Call (2nd Attempt) Unsuccessful Call (2nd Attempt) Date: 06/15/23  Attempted to reach the patient regarding the most recent Inpatient/ED visit.  Follow Up Plan: Additional outreach attempts will be made to reach the patient to complete the Transitions of Care (Post Inpatient/ED visit) call.   Gean Maidens BSN RN [place] [facility] Health Care Management Coordinator [email or web address] Direct Dial: [phone]  Fax: [phone] Website: [email or web address]

## 2023-06-16 ENCOUNTER — Telehealth: Payer: Self-pay | Admitting: *Deleted

## 2023-06-16 NOTE — Transitions of Care (Post Inpatient/ED Visit) (Signed)
   06/16/2023  Name: Shelley [name] MRN: [id number] DOB: 1951/02/03  Today's TOC FU Call Status: Today's TOC FU Call Status:: Unsuccessful Call (3rd Attempt) Unsuccessful Call (3rd Attempt) Date: 06/16/23  Attempted to reach the patient regarding the most recent Inpatient/ED visit.  Follow Up Plan: No further outreach attempts will be made at this time. We have been unable to contact the patient.  Gean Maidens BSN RN [facility] Health Care Management Coordinator [email or web address] Direct Dial: [phone]  Fax: [phone] Website: [email or web address]

## 2023-06-29 ENCOUNTER — Encounter: Payer: Self-pay | Admitting: *Deleted

## 2023-06-30 ENCOUNTER — Ambulatory Visit: Admitting: Physician Assistant

## 2023-06-30 ENCOUNTER — Ambulatory Visit (HOSPITAL_COMMUNITY)
Admission: RE | Admit: 2023-06-30 | Discharge: 2023-06-30 | Disposition: A | Discharge status: Home / Self Care | Source: Ambulatory Visit | Attending: Physician Assistant | Admitting: Physician Assistant

## 2023-06-30 VITALS — BP 115/70 | HR 61 | Temp 97.7°F | Wt 186.0 lb

## 2023-06-30 DIAGNOSIS — M792 Neuralgia and neuritis, unspecified: Secondary | ICD-10-CM

## 2023-06-30 MED ORDER — GABAPENTIN 600 MG PO TABS: 600.0000 mg | ORAL_TABLET | Freq: Three times a day (TID) | ORAL | 1 refills | Status: DC

## 2023-06-30 MED ORDER — HYDROCODONE-ACETAMINOPHEN 5-325 MG PO TABS: 1.0000 | ORAL_TABLET | Freq: Three times a day (TID) | ORAL | 0 refills | Status: AC | PRN

## 2023-06-30 NOTE — Progress Notes (Signed)
   Acute Office Visit  Subjective:     Patient ID: Shelley [name], female    DOB: 03-Sep-1950, 73 y.o.   MRN: [id number]   Patient presents today with complaints of right forefoot pain x1 week. She reports pain started after her pacemaker insertion last week. She reports pacemaker inserted via right groin. She states pain has been ongoing since that procedure. She describes sharp, stabbing, tingling pain. She denies trauma or injury. She denies fevers. She reports 1 fall this morning due to pain in her foot. She reports foot is swollen unless she keeps it elevated. She takes gabapentin daily, and has been taking tylenol and advil as needed for pain.      Review of Systems  Constitutional:  Negative for chills, fever and malaise/fatigue.  Musculoskeletal:  Positive for falls and joint pain.       Foot pain        Objective:     BP 115/70   Pulse 61   Temp 97.7 F (36.5 C)   Wt 186 lb (84.4 kg)   SpO2 98%   BMI 30.02 kg/m   Physical Exam Constitutional:      Appearance: Normal appearance. She is obese.  HENT:     Head: Normocephalic and atraumatic.  Cardiovascular:     Rate and Rhythm: Normal rate and regular rhythm.     Heart sounds: Normal heart sounds. No murmur heard. Pulmonary:     Effort: Pulmonary effort is normal.     Breath sounds: Normal breath sounds.  Musculoskeletal:     Right foot: Swelling and tenderness present. Abnormal pulse.     Left Lower Extremity: Left leg is amputated above knee.  Feet:     Right foot:     Skin integrity: Erythema, dry skin and fissure present. No warmth.     Toenail Condition: Right toenails are abnormally thick and long.  Neurological:     Mental Status: She is alert.     No results found for any visits on 06/30/23.      Assessment & Plan:  Neuropathic pain of right foot Assessment & Plan: Patient presents today for new onset right foot pain. No signs of acute infection. Foot is swollen and slightly erythematous on  dorsal aspect. I do not suspect acute cellulitis or infection at this time. We will go ahead with right foot imaging today. Increasing gabapentin to 600mg  TID. Starting patient on hydrocodone (10 tabs) for severe pain only. Patient counseled on sedating and addicting nature of medication. Patient advised to take medication for severe pain only. Patient advised to avoid NSAID medication like advil. Vascular referral today for decreased posterior tibialis and pedal pulses. We will do podiatry referral based on x-rays and symptom progression. Patient voices understanding.   Orders: -     Gabapentin; Take 1 tablet (600 mg total) by mouth 3 (three) times daily.  Dispense: 90 tablet; Refill: 1 -     DG Foot Complete Right -     HYDROcodone-Acetaminophen; Take 1 tablet by mouth every 8 (eight) hours as needed for severe pain (pain score 7-10).  Dispense: 10 tablet; Refill: 0 -     Ambulatory referral to Vascular Surgery   Return if symptoms worsen or fail to improve.  Toni Amend Michio Thier, PA-C

## 2023-06-30 NOTE — Assessment & Plan Note (Addendum)
 Patient presents today for new onset right foot pain. No signs of acute infection. Foot is swollen and slightly erythematous on dorsal aspect. I do not suspect acute cellulitis or infection at this time. We will go ahead with right foot imaging today. Increasing gabapentin to 600mg  TID. Starting patient on hydrocodone (10 tabs) for severe pain only. Patient counseled on sedating and addicting nature of medication. Patient advised to take medication for severe pain only. Patient advised to avoid NSAID medication like advil. Vascular referral today for decreased posterior tibialis and pedal pulses. We will do podiatry referral based on x-rays and symptom progression. Patient voices understanding.

## 2023-07-01 ENCOUNTER — Encounter: Payer: Self-pay | Admitting: Physician Assistant

## 2023-07-30 ENCOUNTER — Other Ambulatory Visit: Payer: Self-pay | Admitting: *Deleted

## 2023-07-30 DIAGNOSIS — R0989 Other specified symptoms and signs involving the circulatory and respiratory systems: Secondary | ICD-10-CM

## 2023-08-03 ENCOUNTER — Encounter

## 2023-08-04 ENCOUNTER — Ambulatory Visit (INDEPENDENT_AMBULATORY_CARE_PROVIDER_SITE_OTHER): Admitting: Vascular Surgery

## 2023-08-04 ENCOUNTER — Encounter: Payer: Self-pay | Admitting: Vascular Surgery

## 2023-08-04 ENCOUNTER — Ambulatory Visit
Admission: RE | Admit: 2023-08-04 | Discharge: 2023-08-04 | Disposition: A | Discharge status: Home / Self Care | Source: Ambulatory Visit | Attending: Vascular Surgery | Admitting: Vascular Surgery

## 2023-08-04 ENCOUNTER — Other Ambulatory Visit: Payer: Self-pay

## 2023-08-04 VITALS — BP 184/79 | HR 74 | Temp 97.7°F | Ht 66.0 in | Wt 197.0 lb

## 2023-08-04 DIAGNOSIS — R0989 Other specified symptoms and signs involving the circulatory and respiratory systems: Secondary | ICD-10-CM | POA: Diagnosis present

## 2023-08-04 DIAGNOSIS — I70221 Atherosclerosis of native arteries of extremities with rest pain, right leg: Secondary | ICD-10-CM

## 2023-08-04 LAB — VAS US ABI WITH/WO TBI

## 2023-08-04 NOTE — Progress Notes (Signed)
 Patient ID: Shelley [name], female   DOB: 06-Jun-1950, 73 y.o.   MRN: [id number]  Reason for Consult: New Patient (Initial Visit)   Referred by Grooms, Jearlean Mince, PA-C  Subjective:     HPI:  Shelley [name] is a 73 y.o. female has a history of end-stage renal disease now status post transplant 7 years ago which is working well.  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  More recently she underwent pacemaker placement via the right groin access and has had pain radiating down the right leg and into the right foot but this is recovering with Neurontin .  She has never had any arterial interventions of the lower extremities.  She does have some dark discoloration of the right foot but no frank pain does not walk enough to elicit claudication.  Past Medical History:  Diagnosis Date   Diabetes (HCC)    Diabetes mellitus without complication (HCC)    End stage renal disease (HCC)    HTN (hypertension)    Hyperlipidemia    Hypertension    History reviewed. No pertinent family history. Past Surgical History:  Procedure Laterality Date   ABDOMINAL HYSTERECTOMY     fibroids/complete hysterec 2000   APPENDECTOMY     CATARACT EXTRACTION Bilateral    COLONOSCOPY N/A 03/12/2016   Procedure: COLONOSCOPY;  Surgeon: Suzette Espy, MD;  [facility];  Service: Endoscopy;  Laterality: N/A;  9:30 am   POLYPECTOMY  03/12/2016   Procedure: POLYPECTOMY;  Surgeon: Suzette Espy, MD;  Location: [facility];  Service: Endoscopy;;  colon   YAG LASER APPLICATION Left 01/22/2014   Procedure: YAG LASER APPLICATION;  Surgeon: Clay Cummins, MD;  Location: [facility] ORS;  Service: Ophthalmology;  Laterality: Left;    Short Social History:  Social History   Tobacco Use   Smoking status: Former    Current packs/day: 0.00    Average packs/day: 1 pack/day for 4.0 years (4.0 ttl pk-yrs)    Types: Cigarettes    Start date: 01/29/2001    Quit date: 01/29/2005     Years since quitting: 18.5   Smokeless tobacco: Never  Substance Use Topics   Alcohol use: No    No Known Allergies  Current Outpatient Medications  Medication Sig Dispense Refill   amLODipine  (NORVASC ) 10 MG tablet Take 10 mg by mouth daily.      atorvastatin  (LIPITOR) 40 MG tablet TAKE 1 TABLET (40 MG TOTAL) BY MOUTH DAILY. 90 tablet 1   calcium  acetate (PHOSLO) 667 MG capsule Take 1,334 mg by mouth 3 (three) times daily with meals.      calcium -vitamin D (OSCAL WITH D) 500-200 MG-UNIT tablet Take 1 tablet by mouth.     cloNIDine  (CATAPRES ) 0.1 MG tablet TAKE 1 TABLET BY MOUTH TWICE A DAY 180 tablet 0   famotidine  (PEPCID ) 10 MG tablet Take by mouth.     gabapentin  (NEURONTIN ) 600 MG tablet Take 1 tablet (600 mg total) by mouth 3 (three) times daily. 90 tablet 1   HYDROcodone -acetaminophen  (NORCO/VICODIN) 5-325 MG tablet Take 1 tablet by mouth every 8 (eight) hours as needed for severe pain (pain score 7-10). 10 tablet 0   insulin  aspart (NOVOLOG ) 100 UNIT/ML FlexPen Inject 5 Units as directed 3 (three) times daily with meals.     Insulin  Pen Needle (PEN NEEDLES 3/16") 31G X 5 MM MISC 1 each by Does not apply route daily. Use daily to inject insulin  200  each 3   Multiple Vitamin (MULTIVITAMIN WITH MINERALS) TABS tablet Take 1 tablet by mouth daily.     mycophenolate  (CELLCEPT ) 250 MG capsule Take by mouth 2 (two) times daily. Takes 3 capsules by mouth Q 12 hours     predniSONE  (DELTASONE ) 5 MG tablet Take 5 mg by mouth daily with breakfast.     sertraline  (ZOLOFT ) 50 MG tablet TAKE 1 TABLET BY MOUTH EVERY DAY 90 tablet 1   No current facility-administered medications for this visit.    Review of Systems  Constitutional:  Constitutional negative. HENT: HENT negative.  Eyes: Eyes negative.  Respiratory: Respiratory negative.  Cardiovascular: Cardiovascular negative.  GI: Gastrointestinal negative.  Musculoskeletal: Musculoskeletal negative.  Skin: Skin negative.  Neurological:  Neurological negative. Hematologic: Hematologic/lymphatic negative.  Psychiatric: Psychiatric negative.        Objective:  Objective   Vitals:   08/04/23 0848  BP: (!) 184/79  Pulse: 74  Temp: 97.7 F (36.5 C)  SpO2: 98%  Weight: 197 lb (89.4 kg)  Height: 5\' 6"  (1.676 m)   Body mass index is 31.8 kg/m.  Physical Exam HENT:     Head: Normocephalic.     Nose: Nose normal.     Mouth/Throat:     Mouth: Mucous membranes are moist.  Eyes:     Pupils: Pupils are equal, round, and reactive to light.  Cardiovascular:     Rate and Rhythm: Normal rate.     Pulses:          Femoral pulses are 2+ on the right side and 2+ on the left side.      Popliteal pulses are 0 on the right side.       Dorsalis pedis pulses are 0 on the right side.       Posterior tibial pulses are 0 on the right side.  Pulmonary:     Effort: Pulmonary effort is normal.  Abdominal:     General: Abdomen is flat.  Musculoskeletal:     Comments: [employer] Divers prosthetic in place left lower extremity  Neurological:     Mental Status: She is alert.     Data: ABI Findings:  +---------+------------------+-----+----------+----------------+  Right   Rt Pressure (mmHg)IndexWaveform  Comment           +---------+------------------+-----+----------+----------------+  Brachial 196                                                +---------+------------------+-----+----------+----------------+  PTA                            biphasic  non-compressible  +---------+------------------+-----+----------+----------------+  DP      160               0.80 monophasic                  +---------+------------------+-----+----------+----------------+  Great Toe43                0.21                             +---------+------------------+-----+----------+----------------+   +--------+------------------+-----+--------+-------+  Left   Lt Pressure (mmHg)IndexWaveformComment   +--------+------------------+-----+--------+-------+  Brachial201                                    +--------+------------------+-----+--------+-------+  PTA                                   BKA      +--------+------------------+-----+--------+-------+  DP                                    BKA      +--------+------------------+-----+--------+-------+   +-------+-----------+-----------+------------+------------+  ABI/TBIToday's ABIToday's TBIPrevious ABIPrevious TBI  +-------+-----------+-----------+------------+------------+  Right Heartwell         0.21                                 +-------+-----------+-----------+------------+------------+           Summary:  Right: Resting right ankle-brachial index indicates noncompressible right  lower extremity arteries. The right toe-brachial index is abnormal.   Left:  BKA.      Assessment/Plan:     73 year old female with history as above now with right lower extremity pain likely due to femoral nerve irritation.  Plan will be for follow-up in 3 to 4 months with right lower extremity arterial duplex and ABIs.  She does need diligent protection of that right foot.  If if angiography is necessary in the future would require CO2 given her history of renal transplant.    Adine Hoof MD [facility]

## 2023-08-06 ENCOUNTER — Other Ambulatory Visit: Payer: Self-pay | Admitting: *Deleted

## 2023-08-06 DIAGNOSIS — R0989 Other specified symptoms and signs involving the circulatory and respiratory systems: Secondary | ICD-10-CM

## 2023-08-06 DIAGNOSIS — I70221 Atherosclerosis of native arteries of extremities with rest pain, right leg: Secondary | ICD-10-CM

## 2023-09-05 ENCOUNTER — Other Ambulatory Visit: Payer: Self-pay | Admitting: Physician Assistant

## 2023-09-05 DIAGNOSIS — M792 Neuralgia and neuritis, unspecified: Secondary | ICD-10-CM

## 2023-09-06 ENCOUNTER — Other Ambulatory Visit: Payer: Self-pay

## 2023-09-06 DIAGNOSIS — M792 Neuralgia and neuritis, unspecified: Secondary | ICD-10-CM

## 2023-09-06 MED ORDER — GABAPENTIN 600 MG PO TABS
600.0000 mg | ORAL_TABLET | Freq: Three times a day (TID) | ORAL | 1 refills | Status: DC
Start: 2023-09-06 — End: 2023-11-03

## 2023-10-01 ENCOUNTER — Other Ambulatory Visit: Payer: Self-pay | Admitting: Family Medicine

## 2023-10-08 ENCOUNTER — Ambulatory Visit: Payer: Self-pay

## 2023-10-08 ENCOUNTER — Encounter: Payer: Self-pay | Admitting: Physician Assistant

## 2023-10-08 ENCOUNTER — Ambulatory Visit: Admitting: Physician Assistant

## 2023-10-08 VITALS — BP 138/68 | HR 75 | Temp 97.3°F | Ht 66.0 in

## 2023-10-08 DIAGNOSIS — R197 Diarrhea, unspecified: Secondary | ICD-10-CM | POA: Diagnosis not present

## 2023-10-08 DIAGNOSIS — R110 Nausea: Secondary | ICD-10-CM

## 2023-10-08 MED ORDER — ONDANSETRON HCL 4 MG PO TABS: 4.0000 mg | ORAL_TABLET | Freq: Three times a day (TID) | ORAL | 0 refills | Status: AC | PRN

## 2023-10-08 NOTE — Telephone Encounter (Signed)
 Copied from CRM [id number]. Topic: Clinical - Red Word Triage >> Oct 08, 2023  3:03 PM Rachelle R wrote: Red Word that prompted transfer to Nurse Triage: Patient states for the last three days has had diarrhea and been vomiting. Reason for Disposition . [1] MILD or MODERATE vomiting AND [2] present > 48 hours (2 days)  (Exception: Mild vomiting with associated diarrhea.)  Answer Assessment - Initial Assessment Questions 1. VOMITING SEVERITY: How many times have you vomited in the past 24 hours?      2 2. ONSET: When did the vomiting begin?      3 days 3. FLUIDS: What fluids or food have you vomited up today? Have you been able to keep any fluids down?     no 4. ABDOMEN PAIN: Are your having any abdomen pain? If Yes : How bad is it and what does it feel like? (e.g., crampy, dull, intermittent, constant)      yes 5. DIARRHEA: Is there any diarrhea? If Yes, ask: How many times today?      several 6. CONTACTS: Is there anyone else in the family with the same symptoms?      no 7. CAUSE: What do you think is causing your vomiting?     unsure 8. HYDRATION STATUS: Any signs of dehydration? (e.g., dry mouth [not only dry lips], too weak to stand) When did you last urinate?     no 9. OTHER SYMPTOMS: Do you have any other symptoms? (e.g., fever, headache, vertigo, vomiting blood or coffee grounds, recent head injury)     no 10. PREGNANCY: Is there any chance you are pregnant? When was your last menstrual period?       no  Protocols used: Vomiting-A-AH

## 2023-10-08 NOTE — Telephone Encounter (Signed)
 FYI Only or Action Required?: Action required by provider: request for appointment.  Patient was last seen in primary care on 01/22/2023 by Alphonsa Glendia LABOR, MD.  Called Nurse Triage reporting Vomiting.  Symptoms began several days ago.  Interventions attempted: Rest, hydration, or home remedies.  Symptoms are: unchanged.  Vomiting, diarrhea x 3 days. I'm not keeping anything down.  Triage Disposition: See Physician Within 24 Hours  Patient/caregiver understands and will follow disposition?: Yes

## 2023-10-08 NOTE — Assessment & Plan Note (Signed)
 Likely self-resolving infection, exam benign. CBC and CMP today.  Zofran  for nausea. Recommended imodium for diarrhea. Patient to call on Monday of diarrhea persists.  Maintain good hydration, frequent small amounts of oral rehydration solution. Bland diet for 2-3 days, advance as tolerated. Return for signs of dehydration, urinating less than 3 times in 24 hours.  Return if blood develops in stool or fever lasts longer than 5 days.

## 2023-10-08 NOTE — Progress Notes (Signed)
   Acute Office Visit  Subjective:     Patient ID: Shelley [name], female    DOB: 12-30-1950, 73 y.o.   MRN: [id number]   Patient presents today with complaints of diarrhea. Symptoms began 3 days ago with vomiting and since have evolved to diarrhea. No vomiting since onset. She reports 3-4 episodes of diarrhea today. Decreased appetite, is staying well hydrated. Denies fever, abdominal pain, or blood in stool. Admits nausea and generalized malaise.      Review of Systems  Constitutional:  Positive for malaise/fatigue. Negative for fever and weight loss.  Gastrointestinal:  Positive for diarrhea, nausea and vomiting. Negative for abdominal pain, blood in stool and constipation.        Objective:     BP 138/68   Pulse 75   Temp (!) 97.3 F (36.3 C)   Ht 5' 6 (1.676 m)   SpO2 94%   BMI 31.80 kg/m   Physical Exam Constitutional:      Appearance: Normal appearance. She is obese.  HENT:     Head: Normocephalic and atraumatic.     Mouth/Throat:     Mouth: Mucous membranes are moist.     Pharynx: Oropharynx is clear.  Eyes:     Extraocular Movements: Extraocular movements intact.     Conjunctiva/sclera: Conjunctivae normal.  Cardiovascular:     Rate and Rhythm: Normal rate and regular rhythm.     Heart sounds: Normal heart sounds. No murmur heard. Pulmonary:     Effort: Pulmonary effort is normal.     Breath sounds: No wheezing or rales.  Abdominal:     General: Abdomen is flat. Bowel sounds are normal. There is no distension.     Palpations: Abdomen is soft.     Tenderness: There is no abdominal tenderness. There is no guarding.  Skin:    General: Skin is warm and dry.  Neurological:     General: No focal deficit present.     Mental Status: She is alert and oriented to person, place, and time.  Psychiatric:        Mood and Affect: Mood normal.        Behavior: Behavior normal.     No results found for any visits on 10/08/23.      Assessment & Plan:   Diarrhea, unspecified type Assessment & Plan: Likely self-resolving infection, exam benign. CBC and CMP today.  Zofran  for nausea. Recommended imodium for diarrhea. Patient to call on Monday of diarrhea persists.  Maintain good hydration, frequent small amounts of oral rehydration solution. Bland diet for 2-3 days, advance as tolerated. Return for signs of dehydration, urinating less than 3 times in 24 hours.  Return if blood develops in stool or fever lasts longer than 5 days.    Orders: -     CBC with Differential/Platelet -     CMP14+EGFR  Nausea -     Ondansetron  HCl; Take 1 tablet (4 mg total) by mouth every 8 (eight) hours as needed for nausea or vomiting.  Dispense: 20 tablet; Refill: 0    Return if symptoms worsen or fail to improve.  Charmaine Kobi Mario, PA-C

## 2023-10-09 ENCOUNTER — Telehealth (INDEPENDENT_AMBULATORY_CARE_PROVIDER_SITE_OTHER): Payer: Self-pay | Admitting: Physician Assistant

## 2023-10-09 LAB — CMP14+EGFR
ALT: 15 IU/L (ref 0–32)
AST: 24 IU/L (ref 0–40)
Albumin: 3.6 g/dL — ABNORMAL LOW (ref 3.8–4.8)
Alkaline Phosphatase: 79 IU/L (ref 44–121)
BUN/Creatinine Ratio: 13 (ref 12–28)
BUN: 81 mg/dL (ref 8–27)
Bilirubin Total: 0.6 mg/dL (ref 0.0–1.2)
CO2: 17 mmol/L — ABNORMAL LOW (ref 20–29)
Calcium: 8.8 mg/dL (ref 8.7–10.3)
Chloride: 91 mmol/L — ABNORMAL LOW (ref 96–106)
Creatinine, Ser: 6.14 mg/dL — ABNORMAL HIGH (ref 0.57–1.00)
Globulin, Total: 2.3 g/dL (ref 1.5–4.5)
Glucose: 395 mg/dL — ABNORMAL HIGH (ref 70–99)
Potassium: 4.9 mmol/L (ref 3.5–5.2)
Sodium: 129 mmol/L — ABNORMAL LOW (ref 134–144)
Total Protein: 5.9 g/dL — ABNORMAL LOW (ref 6.0–8.5)
eGFR: 7 mL/min/1.73 — ABNORMAL LOW (ref >59)

## 2023-10-09 LAB — CBC WITH DIFFERENTIAL/PLATELET
Basophils Absolute: 0 x10E3/uL (ref 0.0–0.2)
Basos: 0 %
EOS (ABSOLUTE): 0 x10E3/uL (ref 0.0–0.4)
Eos: 0 %
Hematocrit: 33.2 % — ABNORMAL LOW (ref 34.0–46.6)
Hemoglobin: 10.3 g/dL — ABNORMAL LOW (ref 11.1–15.9)
Immature Grans (Abs): 0 x10E3/uL (ref 0.0–0.1)
Immature Granulocytes: 0 %
Lymphocytes Absolute: 0.9 x10E3/uL (ref 0.7–3.1)
Lymphs: 8 %
MCH: 29.4 pg (ref 26.6–33.0)
MCHC: 31 g/dL — ABNORMAL LOW (ref 31.5–35.7)
MCV: 95 fL (ref 79–97)
Monocytes Absolute: 0.5 x10E3/uL (ref 0.1–0.9)
Monocytes: 5 %
Neutrophils Absolute: 9.9 x10E3/uL — ABNORMAL HIGH (ref 1.4–7.0)
Neutrophils: 87 %
Platelets: 248 x10E3/uL (ref 150–450)
RBC: 3.5 x10E6/uL — ABNORMAL LOW (ref 3.77–5.28)
RDW: 13.5 % (ref 11.7–15.4)
WBC: 11.4 x10E3/uL — ABNORMAL HIGH (ref 3.4–10.8)

## 2023-10-09 NOTE — Telephone Encounter (Signed)
 Called pt to discuss lab results from yesterday. Advised patient seek care in ER for significantly decreased kidney function and electrolyte abnormlities. Patient voices understanding and states she will go to [facility] ER as she follows with [facility] nephology.

## 2023-10-11 ENCOUNTER — Ambulatory Visit: Payer: Self-pay | Admitting: Physician Assistant

## 2023-10-22 ENCOUNTER — Telehealth: Payer: Self-pay | Admitting: *Deleted

## 2023-10-22 NOTE — Transitions of Care (Post Inpatient/ED Visit) (Signed)
   10/22/2023  Name: Shelley [name] MRN: [id number] DOB: 12-14-1950  Today's TOC FU Call Status: Today's TOC FU Call Status:: Unsuccessful Call (1st Attempt) Unsuccessful Call (1st Attempt) Date: 10/22/23  Attempted to reach the patient regarding the most recent Inpatient/ED visit.  Follow Up Plan: Additional outreach attempts will be made to reach the patient to complete the Transitions of Care (Post Inpatient/ED visit) call.   Mliss Creed [facility], BSN RN Care Manager/ Transition of Care [place] [phone]

## 2023-10-25 ENCOUNTER — Telehealth: Payer: Self-pay

## 2023-10-25 NOTE — Transitions of Care (Post Inpatient/ED Visit) (Signed)
   10/25/2023  Name: Shelley [name] MRN: [id number] DOB: 08-15-1950  Today's TOC FU Call Status: Today's TOC FU Call Status:: Successful TOC FU Call Completed TOC FU Call Complete Date: 10/25/23 [facility] RN spoke with patient who states she has Home Health and denied need for [facility] program/calls) Patient's Name and Date of Birth confirmed.   Shona Prow RN, CCM [place]  VBCI-Population Health RN Care Manager [phone]

## 2023-10-27 ENCOUNTER — Ambulatory Visit (INDEPENDENT_AMBULATORY_CARE_PROVIDER_SITE_OTHER): Admitting: Family Medicine

## 2023-10-27 ENCOUNTER — Encounter: Payer: Self-pay | Admitting: Family Medicine

## 2023-10-27 VITALS — BP 124/54 | HR 83 | Ht 66.0 in | Wt 202.0 lb

## 2023-10-27 DIAGNOSIS — N183 Chronic kidney disease, stage 3 unspecified: Secondary | ICD-10-CM | POA: Diagnosis not present

## 2023-10-27 DIAGNOSIS — D631 Anemia in chronic kidney disease: Secondary | ICD-10-CM

## 2023-10-27 DIAGNOSIS — N3000 Acute cystitis without hematuria: Secondary | ICD-10-CM | POA: Diagnosis not present

## 2023-10-27 DIAGNOSIS — Z940 Kidney transplant status: Secondary | ICD-10-CM | POA: Diagnosis not present

## 2023-10-27 DIAGNOSIS — N189 Chronic kidney disease, unspecified: Secondary | ICD-10-CM

## 2023-10-27 NOTE — Progress Notes (Signed)
   Subjective:    Patient ID: Azaylea Maves, female    DOB: 1950-09-27, 73 y.o.   MRN: [id number]  HPI Hospital follow up kidney disease in transplant patient  Followed by [facility] doctor for DM  Discussed the use of AI scribe software for clinical note transcription with the patient, who gave verbal consent to proceed.  History of Present Illness   Valetta Mulroy is a 73 year old female with a history of renal transplant who presents for follow-up after recent hospitalization for infection and kidney function monitoring.  She was hospitalized for eleven days in July after experiencing severe vomiting and diarrhea, and her creatinine levels were found to be significantly elevated. During her stay at [facility], she was diagnosed with two infections. Her creatinine level was 2.3 at discharge, and she feels much better now.  She is currently on oxygen therapy, using it primarily during activities as her oxygen saturation drops to 86-87% with exertion. At rest, her saturation is around 93-94%. She was bedridden for almost ten days during her hospitalization and has since started physical therapy at home once a week. She is now able to walk around the house.  Her current medications include Envarsus , which was reduced from 3 mg to 1.5 mg, and she is no longer taking mycophenolate . She is also on two antibiotics, including amoxicillin  clavulanate, which causes an indescribable feeling but no nausea. She is also taking torsemide  and hydralazine three times a day.  Her diabetes is well-controlled with her Dexcom monitor showing glucose levels in the 100s, although it can rise to 200 without food intake. Her hemoglobin has been around 9.  No fever, chills, or lightheadedness upon standing. Her appetite is improving, and her bowel movements have returned to normal. She experienced nausea upon exertion when walking from the car to the clinic but felt better after resting.       Review of Systems      Objective:   Physical Exam General-in no acute distress Eyes-no discharge Lungs-respiratory rate normal, CTA CV-no murmurs,RRR Extremities skin warm dry no edema Neuro grossly normal Behavior normal, alert Amputation noted       Assessment & Plan:  1. Hx of kidney transplant (Primary) Lab work ordered await results Will share results with [facility] - Basic Metabolic Panel - CBC with Differential  2. Chronic kidney disease, unspecified CKD stage Continue current measures.  Check lab work - Basic Metabolic Panel - CBC with Differential  3. Anemia in stage 3 chronic kidney disease, unspecified whether stage 3a or 3b CKD (HCC) Continue current measures Check lab work - Basic Metabolic Panel - CBC with Differential  4. Acute cystitis without hematuria Finish out antibiotics warning signs regarding sepsis and when to go to the ER were discussed in detail

## 2023-11-03 ENCOUNTER — Other Ambulatory Visit: Payer: Self-pay | Admitting: Family Medicine

## 2023-11-03 DIAGNOSIS — M792 Neuralgia and neuritis, unspecified: Secondary | ICD-10-CM

## 2023-11-06 ENCOUNTER — Ambulatory Visit: Payer: Self-pay | Admitting: Family Medicine

## 2023-11-06 LAB — CBC WITH DIFFERENTIAL/PLATELET
Basophils Absolute: 0 x10E3/uL (ref 0.0–0.2)
Basos: 1 %
EOS (ABSOLUTE): 0.1 x10E3/uL (ref 0.0–0.4)
Eos: 1 %
Hematocrit: 33.2 % — ABNORMAL LOW (ref 34.0–46.6)
Hemoglobin: 9.9 g/dL — ABNORMAL LOW (ref 11.1–15.9)
Immature Grans (Abs): 0 x10E3/uL (ref 0.0–0.1)
Immature Granulocytes: 0 %
Lymphocytes Absolute: 1.8 x10E3/uL (ref 0.7–3.1)
Lymphs: 28 %
MCH: 30.3 pg (ref 26.6–33.0)
MCHC: 29.8 g/dL — ABNORMAL LOW (ref 31.5–35.7)
MCV: 102 fL — ABNORMAL HIGH (ref 79–97)
Monocytes Absolute: 0.5 x10E3/uL (ref 0.1–0.9)
Monocytes: 8 %
Neutrophils Absolute: 4 x10E3/uL (ref 1.4–7.0)
Neutrophils: 62 %
Platelets: 250 x10E3/uL (ref 150–450)
RBC: 3.27 x10E6/uL — ABNORMAL LOW (ref 3.77–5.28)
RDW: 15.9 % — ABNORMAL HIGH (ref 11.7–15.4)
WBC: 6.4 x10E3/uL (ref 3.4–10.8)

## 2023-11-06 LAB — BASIC METABOLIC PANEL WITH GFR
BUN/Creatinine Ratio: 13 (ref 12–28)
BUN: 33 mg/dL — ABNORMAL HIGH (ref 8–27)
CO2: 20 mmol/L (ref 20–29)
Calcium: 9.6 mg/dL (ref 8.7–10.3)
Chloride: 105 mmol/L (ref 96–106)
Creatinine, Ser: 2.46 mg/dL — ABNORMAL HIGH (ref 0.57–1.00)
Glucose: 186 mg/dL — ABNORMAL HIGH (ref 70–99)
Potassium: 4.5 mmol/L (ref 3.5–5.2)
Sodium: 144 mmol/L (ref 134–144)
eGFR: 20 mL/min/1.73 — ABNORMAL LOW (ref >59)

## 2023-12-08 ENCOUNTER — Ambulatory Visit (HOSPITAL_COMMUNITY)

## 2023-12-08 ENCOUNTER — Ambulatory Visit: Admitting: Vascular Surgery

## 2023-12-10 ENCOUNTER — Telehealth: Payer: Self-pay | Admitting: *Deleted

## 2023-12-10 NOTE — Telephone Encounter (Signed)
 Copied from CRM [id number]. Topic: Clinical - Home Health Verbal Orders >> Dec 10, 2023 10:26 AM Harlene ORN wrote: [facility]  Following up on home health orders that were sent 11/24/2023, and 11/30/2023. Wants to know if they have been signed.  Phone: [phone]

## 2023-12-13 NOTE — Telephone Encounter (Signed)
 I have signed lots of different forms over the past couple weeks If they did not receive what they were looking for they can resend them and we will sign again

## 2023-12-15 ENCOUNTER — Telehealth: Payer: Self-pay

## 2023-12-15 NOTE — Telephone Encounter (Signed)
 Plan of Care placed in Dr Glendia red folder placed up front to be completed.

## 2023-12-16 NOTE — Telephone Encounter (Signed)
 I signed off on the forms thank you

## 2023-12-28 ENCOUNTER — Ambulatory Visit (INDEPENDENT_AMBULATORY_CARE_PROVIDER_SITE_OTHER): Admitting: Family Medicine

## 2023-12-28 VITALS — BP 110/66 | Ht 66.0 in | Wt 202.0 lb

## 2023-12-28 DIAGNOSIS — Z940 Kidney transplant status: Secondary | ICD-10-CM | POA: Diagnosis not present

## 2023-12-28 DIAGNOSIS — M792 Neuralgia and neuritis, unspecified: Secondary | ICD-10-CM

## 2023-12-28 DIAGNOSIS — E1122 Type 2 diabetes mellitus with diabetic chronic kidney disease: Secondary | ICD-10-CM

## 2023-12-28 DIAGNOSIS — Z794 Long term (current) use of insulin: Secondary | ICD-10-CM

## 2023-12-28 DIAGNOSIS — N184 Chronic kidney disease, stage 4 (severe): Secondary | ICD-10-CM

## 2023-12-28 DIAGNOSIS — Z89612 Acquired absence of left leg above knee: Secondary | ICD-10-CM | POA: Diagnosis not present

## 2023-12-28 DIAGNOSIS — E1351 Other specified diabetes mellitus with diabetic peripheral angiopathy without gangrene: Secondary | ICD-10-CM

## 2023-12-28 NOTE — Progress Notes (Signed)
   Subjective:    Patient ID: Shelley [name], female    DOB: 03/09/1951, 73 y.o.   MRN: [id number]  HPI Patient has had a previous amputation She is doing the best he can be imaging She also has diabetes and under decent control she states she gets her A1c through her specialist I do not find evidence of the recent A1c but she states she is seeing specialist later this month and will get her She does have a history of renal transplant Her creatinine is staying in the 2.4 range She was in the hospital earlier this year on oxygen no longer on oxygen currently Her O2 saturation today is 94% She states the gabapentin  is helping her with her neuropathic pain.  She takes it twice a day currently   Review of Systems     Objective:   Physical Exam  General-in no acute distress Eyes-no discharge Lungs-respiratory rate normal, CTA CV-no murmurs,RRR Extremities skin warm dry no edema Neuro grossly normal Behavior normal, alert Amputation noted in left leg      Assessment & Plan:  1. Hx of AKA (above knee amputation), left (HCC) Has her prosthesis on tolerating okay  2. Peripheral vascular disease due to secondary diabetes [facility]) She is to see her specialist get her A1c make sure it is staying under good control she denies any low sugar spells  3. History of renal transplant (Primary) She sees her transplant team every several months doing well overall  4. Type 2 diabetes mellitus with stage 4 chronic kidney disease, with long-term current use of insulin  [facility]) As stated above she will see her specialist later this month and they will manage her A1c  5. Neuropathic pain Patient is currently on gabapentin  twice a day it is helping her continue this  No need to do any advanced lab work today follow-up 6 months

## 2023-12-31 ENCOUNTER — Ambulatory Visit: Payer: Medicare Other

## 2024-01-01 ENCOUNTER — Other Ambulatory Visit: Payer: Self-pay | Admitting: Family Medicine

## 2024-01-01 DIAGNOSIS — M792 Neuralgia and neuritis, unspecified: Secondary | ICD-10-CM

## 2024-01-06 ENCOUNTER — Telehealth: Payer: Self-pay

## 2024-01-06 NOTE — Telephone Encounter (Signed)
 Copied from CRM [id number]. Topic: Clinical - Prescription Issue >> Jan 03, 2024  3:21 PM Harlene ORN wrote: Reason for CRM: Patient has oxygen tanks that need to be removed by [facility]. They said they cannot remove them until they get the approval from her PCP. Please give approval so she can have them removed from her house asap >> Jan 06, 2024  3:02 PM Antony S wrote: Patient is calling again regarding needing the oxygen equipment removed from her home, she has 20 tanks that need removed, dr Alphonsa has to call and let [employer] know its okay to remove them

## 2024-01-17 NOTE — Telephone Encounter (Signed)
 Please fax  this written prescription to discontinue the oxygen to [facility] thank you-Dr. Glendia  (We actually did this a few weeks back but apparently they stated they never received)

## 2024-03-07 ENCOUNTER — Other Ambulatory Visit: Payer: Self-pay | Admitting: Nurse Practitioner

## 2024-03-07 DIAGNOSIS — M792 Neuralgia and neuritis, unspecified: Secondary | ICD-10-CM

## 2024-03-10 ENCOUNTER — Ambulatory Visit (INDEPENDENT_AMBULATORY_CARE_PROVIDER_SITE_OTHER)

## 2024-03-10 VITALS — Ht 66.0 in | Wt 202.0 lb

## 2024-03-10 DIAGNOSIS — Z0000 Encounter for general adult medical examination without abnormal findings: Secondary | ICD-10-CM

## 2024-03-10 NOTE — Progress Notes (Signed)
 "  Chief Complaint  Patient presents with   Medicare Wellness     Subjective:   Shelley [name] is a 73 y.o. female who presents for a Medicare Annual Wellness Visit.  Visit info / Clinical Intake: Medicare Wellness Visit Type:: Subsequent Annual Wellness Visit Persons participating in visit and providing information:: patient Medicare Wellness Visit Mode:: Telephone If telephone:: video declined Since this visit was completed virtually, some vitals may be partially provided or unavailable. Missing vitals are due to the limitations of the virtual format.: Documented vitals are patient reported If Telephone or Video please confirm:: I connected with patient using audio/video enable telemedicine. I verified patient identity with two identifiers, discussed telehealth limitations, and patient agreed to proceed. Patient Location:: home Provider Location:: office Interpreter Needed?: No Pre-visit prep was completed: yes AWV questionnaire completed by patient prior to visit?: yes Date:: 03/08/24 Living arrangements:: (Patient-Rptd) with family/others Patient's Overall Health Status Rating: (Patient-Rptd) good Typical amount of pain: (Patient-Rptd) some Does pain affect daily life?: (Patient-Rptd) no Are you currently prescribed opioids?: no  Dietary Habits and Nutritional Risks How many meals a day?: (Patient-Rptd) 2 Eats fruit and vegetables daily?: (Patient-Rptd) yes Most meals are obtained by: (Patient-Rptd) having others provide food In the last 2 weeks, have you had any of the following?: none Diabetic:: (!) yes Any non-healing wounds?: no How often do you check your BS?: 2 Would you like to be referred to a Nutritionist or for Diabetic Management? : no  Functional Status Activities of Daily Living (to include ambulation/medication): (!) (Patient-Rptd) Needs Assist Ambulation: Independent with device- listed below Home Assistive Devices/Equipment: Johna Finder (specify Type);  Wheelchair Medication Administration: (Patient-Rptd) Independent Home Management (perform basic housework or laundry): (Patient-Rptd) Independent Manage your own finances?: (Patient-Rptd) yes Primary transportation is: (Patient-Rptd) driving Concerns about vision?: no *vision screening is required for WTM* Concerns about hearing?: no  Fall Screening Falls in the past year?: (Patient-Rptd) 1 Number of falls in past year: (Patient-Rptd) 0 Was there an injury with Fall?: (Patient-Rptd) 0 Fall Risk Category Calculator: (Patient-Rptd) 1 Patient Fall Risk Level: (Patient-Rptd) Low Fall Risk  Fall Risk Patient at Risk for Falls Due to: Impaired mobility Fall risk Follow up: Falls evaluation completed; Education provided; Falls prevention discussed  Home and Transportation Safety: All rugs have non-skid backing?: (Patient-Rptd) yes All stairs or steps have railings?: (Patient-Rptd) yes Grab bars in the bathtub or shower?: (Patient-Rptd) yes Have non-skid surface in bathtub or shower?: (Patient-Rptd) yes Good home lighting?: (Patient-Rptd) yes Regular seat belt use?: (Patient-Rptd) yes Hospital stays in the last year:: (!) (Patient-Rptd) yes How many hospital stays:: (Patient-Rptd) 1  Cognitive Assessment Difficulty concentrating, remembering, or making decisions? : (Patient-Rptd) no Will 6CIT or Mini Cog be Completed: no 6CIT or Mini Cog Declined: patient alert, oriented, able to answer questions appropriately and recall recent events  Advance Directives (For Healthcare) Does Patient Have a Medical Advance Directive?: No Would patient like information on creating a medical advance directive?: Yes (MAU/Ambulatory/Procedural Areas - Information given)  Reviewed/Updated  Reviewed/Updated: Reviewed All (Medical, Surgical, Family, Medications, Allergies, Care Teams, Patient Goals)    Allergies (verified) Patient has no known allergies.   Current Medications (verified) Outpatient  Encounter Medications as of 03/10/2024  Medication Sig   alendronate  (FOSAMAX ) 35 MG tablet Take 35 mg by mouth once a week.   amLODipine  (NORVASC ) 10 MG tablet Take 10 mg by mouth daily.    atorvastatin  (LIPITOR) 40 MG tablet TAKE 1 TABLET (40 MG TOTAL) BY MOUTH DAILY.  calcium  acetate (PHOSLO) 667 MG capsule Take 1,334 mg by mouth 3 (three) times daily with meals.    calcium -vitamin D (OSCAL WITH D) 500-200 MG-UNIT tablet Take 1 tablet by mouth.   cloNIDine  (CATAPRES ) 0.1 MG tablet TAKE 1 TABLET BY MOUTH TWICE A DAY   famotidine  (PEPCID ) 10 MG tablet Take by mouth.   gabapentin  (NEURONTIN ) 600 MG tablet TAKE 1 TABLET BY MOUTH THREE TIMES A DAY   insulin  aspart (NOVOLOG ) 100 UNIT/ML FlexPen Inject 5 Units as directed 3 (three) times daily with meals.   Insulin  Pen Needle (PEN NEEDLES 3/16) 31G X 5 MM MISC 1 each by Does not apply route daily. Use daily to inject insulin    Multiple Vitamin (MULTIVITAMIN WITH MINERALS) TABS tablet Take 1 tablet by mouth daily.   mycophenolate  (CELLCEPT ) 250 MG capsule Take by mouth 2 (two) times daily. Takes 3 capsules by mouth Q 12 hours   ondansetron  (ZOFRAN ) 4 MG tablet Take 1 tablet (4 mg total) by mouth every 8 (eight) hours as needed for nausea or vomiting.   predniSONE  (DELTASONE ) 5 MG tablet Take 5 mg by mouth daily with breakfast.   sertraline  (ZOLOFT ) 50 MG tablet TAKE 1 TABLET BY MOUTH EVERY DAY   HYDROcodone -acetaminophen  (NORCO/VICODIN) 5-325 MG tablet Take 1 tablet by mouth every 8 (eight) hours as needed for severe pain (pain score 7-10). (Patient not taking: Reported on 03/10/2024)   Semaglutide,0.25 or 0.5MG /DOS, (OZEMPIC, 0.25 OR 0.5 MG/DOSE,) 2 MG/1.5ML SOPN Inject 0.5 mg into the skin once a week. (Patient not taking: Reported on 03/10/2024)   No facility-administered encounter medications on file as of 03/10/2024.    History: Past Medical History:  Diagnosis Date   Diabetes (HCC)    Diabetes mellitus without complication (HCC)    End  stage renal disease (HCC)    HTN (hypertension)    Hyperlipidemia    Hypertension    Past Surgical History:  Procedure Laterality Date   ABDOMINAL HYSTERECTOMY     fibroids/complete hysterec 2000   APPENDECTOMY     CATARACT EXTRACTION Bilateral    COLONOSCOPY N/A 03/12/2016   Procedure: COLONOSCOPY;  Surgeon: Lamar CHRISTELLA Hollingshead, MD;  Location: [facility];  Service: Endoscopy;  Laterality: N/A;  9:30 am   POLYPECTOMY  03/12/2016   Procedure: POLYPECTOMY;  Surgeon: Lamar CHRISTELLA Hollingshead, MD;  Location: [facility];  Service: Endoscopy;;  colon   YAG LASER APPLICATION Left 01/22/2014   Procedure: YAG LASER APPLICATION;  Surgeon: Dow JULIANNA Burke, MD;  Location: [facility];  Service: Ophthalmology;  Laterality: Left;   Family History  Problem Relation Age of Onset   Diabetes Mother    Early death Mother    Kidney disease Sister    Social History   Occupational History   Not on file  Tobacco Use   Smoking status: Former    Current packs/day: 0.00    Average packs/day: 1 pack/day for 4.0 years (4.0 ttl pk-yrs)    Types: Cigarettes    Start date: 01/29/2001    Quit date: 01/29/2005    Years since quitting: 19.1   Smokeless tobacco: Never  Vaping Use   Vaping status: Never Used  Substance and Sexual Activity   Alcohol use: Never   Drug use: No   Sexual activity: Not Currently    Birth control/protection: None   Tobacco Counseling Counseling given: Not Answered  SDOH Screenings   Food Insecurity: No Food Insecurity (03/08/2024)  Housing: Low Risk (03/08/2024)  Transportation Needs: No Transportation Needs (  03/08/2024)  Utilities: Not At Risk (03/10/2024)  Alcohol Screen: Low Risk (12/25/2022)  Depression (PHQ2-9): Low Risk (03/10/2024)  Financial Resource Strain: Low Risk (03/08/2024)  Physical Activity: Inactive (03/08/2024)  Social Connections: Moderately Integrated (03/08/2024)  Stress: No Stress Concern Present (03/08/2024)  Tobacco Use: Medium Risk (03/10/2024)  Health  Literacy: Adequate Health Literacy (03/10/2024)   See flowsheets for full screening details  Depression Screen PHQ 2 & 9 Depression Scale- Over the past 2 weeks, how often have you been bothered by any of the following problems? Little interest or pleasure in doing things: 0 Feeling down, depressed, or hopeless (PHQ Adolescent also includes...irritable): 0 PHQ-2 Total Score: 0 Trouble falling or staying asleep, or sleeping too much: 0 Feeling tired or having little energy: 0 Poor appetite or overeating (PHQ Adolescent also includes...weight loss): 0 Feeling bad about yourself - or that you are a failure or have let yourself or your family down: 0 Trouble concentrating on things, such as reading the newspaper or watching television (PHQ Adolescent also includes...like school work): 0 Moving or speaking so slowly that other people could have noticed. Or the opposite - being so fidgety or restless that you have been moving around a lot more than usual: 0 Thoughts that you would be better off dead, or of hurting yourself in some way: 0 PHQ-9 Total Score: 0 If you checked off any problems, how difficult have these problems made it for you to do your work, take care of things at home, or get along with other people?: Not difficult at all     Goals Addressed             This Visit's Progress    DIET - EAT MORE FRUITS AND VEGETABLES   On track            Objective:    Today's Vitals   03/10/24 1323  Weight: 202 lb (91.6 kg)  Height: 5' 6 (1.676 m)   Body mass index is 32.6 kg/m.  Hearing/Vision screen Hearing Screening - Comments:: Patient is able to hear conversational tones without difficulty. No issues reported.   Vision Screening - Comments:: Up to date with routine eye exams with Dr. Johnell [facility])  Immunizations and Health Maintenance Health Maintenance  Topic Date Due   DTaP/Tdap/Td (1 - Tdap) Never done   HEMOGLOBIN A1C  02/26/2018   Colonoscopy   03/12/2021   FOOT EXAM  12/17/2022   Mammogram  01/02/2023   COVID-19 Vaccine (8 - Moderna risk 2025-26 season) 06/19/2024   OPHTHALMOLOGY EXAM  10/28/2024   Medicare Annual Wellness (AWV)  03/10/2025   Pneumococcal Vaccine: 50+ Years  Completed   Influenza Vaccine  Completed   Bone Density Scan  Completed   Hepatitis C Screening  Completed   Zoster Vaccines- Shingrix  Completed   Meningococcal B Vaccine  Aged Out        Assessment/Plan:  This is a routine wellness examination for Shelley [name].  Patient Care Team: Alphonsa Glendia LABOR, MD as PCP - General (Family Medicine) Johnell Backbone, MD as Referring Physician (Ophthalmology) Les, Therisa Norris, NP as Nurse Practitioner (Endocrinology) Alto Cough, MD as Attending Physician (Transplant) Sheree Penne Bruckner, MD as Consulting Physician (Vascular Surgery)  I have personally reviewed and noted the following in the patients chart:   Medical and social history Use of alcohol, tobacco or illicit drugs  Current medications and supplements including opioid prescriptions. Functional ability and status Nutritional status Physical activity Advanced directives List of other  physicians Hospitalizations, surgeries, and ER visits in previous 12 months Vitals Screenings to include cognitive, depression, and falls Referrals and appointments  Orders Placed This Encounter  Procedures   [facility] SCREENING LAB    This external order was created through the Results Console.   In addition, I have reviewed and discussed with patient certain preventive protocols, quality metrics, and best practice recommendations. A written personalized care plan for preventive services as well as general preventive health recommendations were provided to patient.   Lavelle Charmaine Browner, LPN   [id number]/[id number]   Return in 1 year (on 03/10/2025).  After Visit Summary: (MyChart) Due to this being a telephonic visit, the after visit  summary with patients personalized plan was offered to patient via MyChart   Nurse Notes: Patient advised to keep follow-up appointment with PCP (06/27/24) Screenings not ordered: Declined Referral/Order for colonoscopy and mammogram at this time   "

## 2024-03-10 NOTE — Patient Instructions (Addendum)
 Shelley [name],  Thank you for taking the time for your Medicare Wellness Visit. I appreciate your continued commitment to your health goals. Please review the care plan we discussed, and feel free to reach out if I can assist you further.  Please note that Annual Wellness Visits do not include a physical exam. Some assessments may be limited, especially if the visit was conducted virtually. If needed, we may recommend an in-person follow-up with your provider.  Ongoing Care Seeing your primary care provider every 3 to 6 months helps us  monitor your health and provide consistent, personalized care.   Referrals If a referral was made during today's visit and you haven't received any updates within two weeks, please contact the referred provider directly to check on the status.  Recommended Screenings:  Health Maintenance  Topic Date Due   DTaP/Tdap/Td vaccine (1 - Tdap) Never done   Hemoglobin A1C  02/26/2018   Colon Cancer Screening  03/12/2021   Complete foot exam   12/17/2022   Breast Cancer Screening  01/02/2023   COVID-19 Vaccine (8 - Moderna risk 2025-26 season) 06/19/2024   Eye exam for diabetics  10/28/2024   Medicare Annual Wellness Visit  03/10/2025   Pneumococcal Vaccine for age over 78  Completed   Flu Shot  Completed   Osteoporosis screening with Bone Density Scan  Completed   Hepatitis C Screening  Completed   Zoster (Shingles) Vaccine  Completed   Meningitis B Vaccine  Aged Out       03/08/2024    1:26 PM  Advanced Directives  Does Patient Have a Medical Advance Directive? No  Would patient like information on creating a medical advance directive? Yes (MAU/Ambulatory/Procedural Areas - Information given)   Information on Advanced Care Planning can be found at [place]  Secretary of [facility] Advance Health Care Directives Advance Health Care Directives ([email or web address]   Vision: Annual vision screenings are recommended for early detection of glaucoma, cataracts, and  diabetic retinopathy. These exams can also reveal signs of chronic conditions such as diabetes and high blood pressure.  Dental: Annual dental screenings help detect early signs of oral cancer, gum disease, and other conditions linked to overall health, including heart disease and diabetes.  Please see the attached documents for additional preventive care recommendations.

## 2024-03-29 ENCOUNTER — Other Ambulatory Visit: Payer: Self-pay | Admitting: Family Medicine

## 2024-04-17 LAB — HM DEXA SCAN

## 2024-06-27 ENCOUNTER — Ambulatory Visit: Admitting: Family Medicine
